# Patient Record
Sex: Male | Born: 1940
Health system: Southern US, Community
[De-identification: ages and names within clinical notes are randomized; demographics above are authoritative.]

## PROBLEM LIST (undated history)

## (undated) DIAGNOSIS — Z87442 Personal history of urinary calculi: Secondary | ICD-10-CM

## (undated) DIAGNOSIS — K219 Gastro-esophageal reflux disease without esophagitis: Secondary | ICD-10-CM

## (undated) DIAGNOSIS — M1990 Unspecified osteoarthritis, unspecified site: Secondary | ICD-10-CM

## (undated) DIAGNOSIS — I639 Cerebral infarction, unspecified: Secondary | ICD-10-CM

## (undated) DIAGNOSIS — E785 Hyperlipidemia, unspecified: Secondary | ICD-10-CM

## (undated) DIAGNOSIS — H3530 Unspecified macular degeneration: Secondary | ICD-10-CM

## (undated) DIAGNOSIS — F0390 Unspecified dementia without behavioral disturbance: Secondary | ICD-10-CM

## (undated) DIAGNOSIS — I739 Peripheral vascular disease, unspecified: Secondary | ICD-10-CM

## (undated) HISTORY — DX: Hyperlipidemia, unspecified: E78.5

## (undated) HISTORY — PX: FRACTURE SURGERY: SHX138

## (undated) HISTORY — PX: KNEE SURGERY: SHX244

## (undated) HISTORY — PX: NECK SURGERY: SHX720

## (undated) HISTORY — DX: Peripheral vascular disease, unspecified: I73.9

## (undated) HISTORY — PX: OTHER SURGICAL HISTORY: SHX169

## (undated) HISTORY — PX: EYE SURGERY: SHX253

## (undated) HISTORY — DX: Unspecified macular degeneration: H35.30

## (undated) HISTORY — PX: KNEE ARTHROPLASTY: SHX992

## (undated) HISTORY — PX: ROTATOR CUFF REPAIR: SHX139

---

## 2002-11-24 ENCOUNTER — Emergency Department (HOSPITAL_COMMUNITY): Admission: EM | Admit: 2002-11-24 | Discharge: 2002-11-24 | Payer: Self-pay | Admitting: Emergency Medicine

## 2003-12-09 ENCOUNTER — Inpatient Hospital Stay (HOSPITAL_COMMUNITY): Admission: EM | Admit: 2003-12-09 | Discharge: 2003-12-12 | Payer: Self-pay | Admitting: Emergency Medicine

## 2003-12-15 ENCOUNTER — Ambulatory Visit (HOSPITAL_COMMUNITY): Admission: RE | Admit: 2003-12-15 | Discharge: 2003-12-15 | Payer: Self-pay | Admitting: Orthopedic Surgery

## 2004-07-09 ENCOUNTER — Ambulatory Visit: Payer: Self-pay | Admitting: Orthopedic Surgery

## 2004-10-08 ENCOUNTER — Ambulatory Visit: Payer: Self-pay | Admitting: Orthopedic Surgery

## 2007-08-12 ENCOUNTER — Ambulatory Visit: Payer: Self-pay | Admitting: Cardiology

## 2008-11-10 ENCOUNTER — Ambulatory Visit (HOSPITAL_COMMUNITY): Admission: RE | Admit: 2008-11-10 | Discharge: 2008-11-10 | Payer: Self-pay | Admitting: Unknown Physician Specialty

## 2010-03-26 ENCOUNTER — Emergency Department (HOSPITAL_COMMUNITY): Admission: EM | Admit: 2010-03-26 | Discharge: 2010-03-26 | Payer: Self-pay | Admitting: Emergency Medicine

## 2010-08-15 LAB — HEPATIC FUNCTION PANEL
ALT: 21 U/L (ref 0–53)
Albumin: 4.1 g/dL (ref 3.5–5.2)
Indirect Bilirubin: 0.4 mg/dL (ref 0.3–0.9)
Total Protein: 6.5 g/dL (ref 6.0–8.3)

## 2010-08-15 LAB — PROTIME-INR: Prothrombin Time: 13.6 seconds (ref 11.6–15.2)

## 2010-08-15 LAB — URINALYSIS, ROUTINE W REFLEX MICROSCOPIC
Glucose, UA: NEGATIVE mg/dL
Nitrite: NEGATIVE
Specific Gravity, Urine: 1.023 (ref 1.005–1.030)
pH: 6 (ref 5.0–8.0)

## 2010-08-15 LAB — CBC
MCV: 91.6 fL (ref 78.0–100.0)
Platelets: 202 10*3/uL (ref 150–400)
RBC: 4.54 MIL/uL (ref 4.22–5.81)
RDW: 12.7 % (ref 11.5–15.5)
WBC: 7.3 10*3/uL (ref 4.0–10.5)

## 2010-08-15 LAB — SAMPLE TO BLOOD BANK

## 2010-08-15 LAB — DIFFERENTIAL
Basophils Absolute: 0 10*3/uL (ref 0.0–0.1)
Eosinophils Relative: 1 % (ref 0–5)
Lymphocytes Relative: 16 % (ref 12–46)
Lymphs Abs: 1.2 10*3/uL (ref 0.7–4.0)
Neutro Abs: 5.2 10*3/uL (ref 1.7–7.7)
Neutrophils Relative %: 71 % (ref 43–77)

## 2010-08-15 LAB — APTT: aPTT: 25 seconds (ref 24–37)

## 2010-08-15 LAB — POCT I-STAT, CHEM 8
BUN: 17 mg/dL (ref 6–23)
Hemoglobin: 15.6 g/dL (ref 13.0–17.0)
Potassium: 4 mEq/L (ref 3.5–5.1)
Sodium: 139 mEq/L (ref 135–145)
TCO2: 23 mmol/L (ref 0–100)

## 2010-10-19 NOTE — Discharge Summary (Signed)
NAME:  RAYSHON, ALBAUGH                             ACCOUNT NO.:  [id number]   MEDICAL RECORD NO.:  [id number]                   PATIENT TYPE:  INP   LOCATION:  A339                                 FACILITY:  [facility]   PHYSICIAN:  Vickki Hearing, M.D.           DATE OF BIRTH:  [birth year]   DATE OF ADMISSION:  12/09/2003  DATE OF DISCHARGE:  12/12/2003                                 DISCHARGE SUMMARY   ADDITIONAL DIAGNOSES:  Closed left tibia fracture.   DISCHARGE DIAGNOSES:  Closed left tibia fracture.   PROCEDURES:  Intramedullary nailing, left tibia, with proximal locking  screw.  Implants used:  Stryker 11x375 mm tibial nail (Stryker T2 standard  nail).  A 60 mm fully threaded proximal locking screw.   HISTORY OF PRESENT ILLNESS:  The patient is noted to have been cutting a  tree.  The limb snapped back, hit his leg and fractured his tibia.  He  cannot walk, complained of pain, mild deformity, neurovascularly intact.  Was submitted through the emergency room.  Ice and elevation was done on the  first night.  The second day he went to surgery and then had the closed  nailing without complications.   Postoperative did well.  No real problems.  Compartments have remained soft.  He is ambulating with a walker and should be able to go home today.   DISCHARGE MEDICATIONS:  Vicodin 5/500 1-2 q.4h. p.r.n. pain #60 with 5  refills.   FOLLOWUP:  Follow up scheduled for July 28 to have staples removed and have  x-ray done in the office.   DISPOSITION:  To home.   CONDITION ON DISCHARGE:  Stable.     ___________________________________________                                         Vickki Hearing, M.D.   SEH/MEDQ  D:  12/12/2003  T:  12/12/2003  Job:  [id number]

## 2010-10-19 NOTE — H&P (Signed)
NAME:  Terry Sloan, Terry Sloan                             ACCOUNT NO.:  [id number]   MEDICAL RECORD NO.:  [id number]                   PATIENT TYPE:  INP   LOCATION:  A339                                 FACILITY:  [facility]   PHYSICIAN:  Vickki Hearing, M.D.           DATE OF BIRTH:  1940/09/16   DATE OF ADMISSION:  12/09/2003  DATE OF DISCHARGE:                                HISTORY & PHYSICAL   CHIEF COMPLAINT:  Left leg pain.   HISTORY:  This is a 70 year old male who fractured his left tibia while  cutting a tree.  The tree hit his leg and fractured it.  He could not bear  weight, and presented to the emergency room in pain.  He also complained of  some back pain, but that was worked up in the emergency room and found to be  normal.  He had a chest x-ray, EKG, and laboratory studies, which were  normal, as well.   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   He has no history of heart disease or any medical problems.   MEDICATIONS:  He takes no medicines.   ALLERGIES:  No known drug allergies.   SOCIAL HISTORY:  He does not smoke.  His alcohol use is only occasional.   REVIEW OF SYSTEMS:  He did have some back pain prior to injury.   PHYSICAL EXAMINATION:  VITAL SIGNS:  Stable.  GENERAL:  He was well-developed and nourished.  Grooming and hygiene were  good.  CARDIOVASCULAR:  Normal pulses and perfusion.  SKIN:  Intact.  MUSCULOSKELETAL:  Normal findings, except for his left tibia, which was  tender and had a mild deformity.  He could move the foot fine.  Toes were  moved easily, as well.  Muscle tone was good.  NEUROLOGIC:  He complained of some numbness, depending on leg position.  His  calf and leg were soft, with no signs of compartment syndrome.  Mental  status - he was alert and awake.   His x-rays showed a left tibia fracture, which was closed.  There was a  small butterfly fragment, but it was essentially minimally displaced.   I discussed nailing versus casting.  He opted for a nail, stating that he  had to get back to work as soon as possible.  I think the nail will give him  the best chance to ambulate sooner.  Casting can require up to 6 months of  treatment, while with nailing he can start weightbearing after 1 month.     ___________________________________________  Vickki Hearing, M.D.   SEH/MEDQ  D:  12/10/2003  T:  12/10/2003  Job:  [id number]

## 2010-10-19 NOTE — Op Note (Signed)
NAME:  Terry Sloan, Terry Sloan                             ACCOUNT NO.:  [id number]   MEDICAL RECORD NO.:  [id number]                   PATIENT TYPE:  INP   LOCATION:  A339                                 FACILITY:  [facility]   PHYSICIAN:  Vickki Hearing, M.D.           DATE OF BIRTH:  Dec 28, 1940   DATE OF PROCEDURE:  12/10/2003  DATE OF DISCHARGE:                                 OPERATIVE REPORT   PREOPERATIVE DIAGNOSIS:  Closed left tibia fracture.   POSTOPERATIVE DIAGNOSIS:  Closed left tibia fracture.   PROCEDURE:  Intramedullary nailing left tibia.   SURGEON:  Vickki Hearing, M.D.   ANESTHETIC:  Spinal.   TOURNIQUET TIME:  45 minutes.   FINDINGS:  Closed left tibia fracture.   IMPLANTS USED:  1. An 11 x 375 mm tibial nail, Stryker T-2 standard nail.  2. A 60-mm fully threaded proximal locking screw.   HISTORY:  A 70 year old male sustained a closed left tibia fracture from a  tree branch snapping back into his left tibia.   PROCEDURE DETAILS:  Mr. Hayashi was identified in the preop holding area.  Initials were placed over the premarked surgical site which was the left  leg.  He was given Ancef and taken to the operating room for a spinal  anesthetic, placed supine with a tourniquet on the left thigh.  Sterile prep  and drape was done.  A time out was taken, as required.  Everyone agreed  with the procedure, left tibial nailing on [facility] and we proceeded by  elevating the tourniquet after exsanguination of the limb.   An incision was made over the patellar tendon and carried down to the  lateral side of the patellar tendon.  The proximal tibia was exposed through  blunt dissection.  A curved awl was placed in the intramedullary canal and  confirmed with AP and lateral C-arm radiographs.   A guidewire was passed across the fracture site and serial reamings up to a  12.5 mm size was performed.  The nail was passed.  Radiographs confirmed the  fracture reduction and position  of the nail.  We made a stab incision  laterally and passed a drill bit across the static hole in the nail,  measured it, and passed, and locked the screw.  We irrigated both wounds  closed with 2-0 Vicryl and staples on the tibial wound and staples on the  proximal locking  screw.  Fracture was stable.  Final radiographs confirmed the position of  the nail, the screw, the reduction, and all were satisfactory.   The wound was dressed sterilely.  The patient was taken to the recovery room  in stable condition.      ___________________________________________  Vickki Hearing, M.D.   SEH/MEDQ  D:  12/10/2003  T:  12/11/2003  Job:  [id number]

## 2010-10-19 NOTE — Op Note (Signed)
NAME:  Terry Sloan, Terry Sloan                             ACCOUNT NO.:  [id number]   MEDICAL RECORD NO.:  [id number]                   PATIENT TYPE:  INP   LOCATION:  A339                                 FACILITY:  [facility]   PHYSICIAN:  Vickki Hearing, M.D.           DATE OF BIRTH:  [birth year]   DATE OF PROCEDURE:  DATE OF DISCHARGE:  12/12/2003                                 OPERATIVE REPORT   ADMITTING DIAGNOSIS:  Left tibia fracture.   DISCHARGE DIAGNOSIS:  Left tibia fracture.   OPERATIVE PROCEDURE:  Intramedullary nailing left tibia.   IMPLANT USED:  Stryker T2 standard nail.   DICTATION STOPPED HERE AT DICTATOR REQUEST      ___________________________________________                                            Vickki Hearing, M.D.   SEH/MEDQ  D:  12/17/2003  T:  12/18/2003  Job:  [id number]

## 2013-12-17 ENCOUNTER — Encounter: Payer: Self-pay | Admitting: Family

## 2013-12-17 ENCOUNTER — Ambulatory Visit (INDEPENDENT_AMBULATORY_CARE_PROVIDER_SITE_OTHER): Payer: Commercial Managed Care - HMO | Admitting: Family

## 2013-12-17 ENCOUNTER — Encounter (INDEPENDENT_AMBULATORY_CARE_PROVIDER_SITE_OTHER): Payer: Self-pay

## 2013-12-17 VITALS — BP 141/81 | HR 66 | Temp 96.5°F | Ht 72.0 in | Wt 212.0 lb

## 2013-12-17 DIAGNOSIS — M25569 Pain in unspecified knee: Secondary | ICD-10-CM

## 2013-12-17 DIAGNOSIS — M25562 Pain in left knee: Secondary | ICD-10-CM

## 2013-12-17 MED ORDER — TRAMADOL HCL 50 MG PO TABS: 50.0000 mg | ORAL_TABLET | Freq: Three times a day (TID) | ORAL | Status: DC | PRN

## 2013-12-17 NOTE — Progress Notes (Signed)
   Subjective:    Patient ID: Terry Sloan, male    DOB: Aug 28, 1940, 73 y.o.   MRN: [id number]  HPI PT presents to office to establish care. Pt currently having left knee pain. Pt wants to have ortho referral. Pt states it was broken 2004 and had surgery. Pt states he has pins and screws in knee. Pt states it is a sharp, aching constant pain 10 out of 10.    Review of Systems  Constitutional: Negative.   HENT: Negative.   Respiratory: Negative.   Cardiovascular: Negative.   Gastrointestinal: Negative.   Endocrine: Negative.   Genitourinary: Negative.   Musculoskeletal: Positive for gait problem and joint swelling.  Neurological: Negative.   Hematological: Negative.   Psychiatric/Behavioral: Negative.   All other systems reviewed and are negative.      Objective:   Physical Exam  Vitals reviewed. Constitutional: He is oriented to person, place, and time. He appears well-developed and well-nourished. No distress.  Cardiovascular: Normal rate, regular rhythm, normal heart sounds and intact distal pulses.   No murmur heard. Pulmonary/Chest: Effort normal and breath sounds normal. No respiratory distress. He has no wheezes.  Abdominal: Soft. Bowel sounds are normal. He exhibits no distension. There is no tenderness.  Musculoskeletal: Normal range of motion. He exhibits edema (2+ edema in bilateral legs) and tenderness (left knee).  Decreased ROM of left knee r/t to edema and pain   Neurological: He is alert and oriented to person, place, and time. He has normal reflexes. No cranial nerve deficit.  Skin: Skin is warm and dry. No rash noted. No erythema.  Psychiatric: He has a normal mood and affect. His behavior is normal. Judgment and thought content normal.     BP 141/81  Pulse 66  Temp(Src) 96.5 F (35.8 C) (Oral)  Ht 6' (1.829 m)  Wt 212 lb (96.163 kg)  BMI 28.75 kg/m2      Assessment & Plan:  1. Left knee pain - CMP14+EGFR - traMADol (ULTRAM) 50 MG tablet; Take 1  tablet (50 mg total) by mouth every 8 (eight) hours as needed.  Dispense: 30 tablet; Refill: 1 -Pt will need ortho referral- Waiting for provider name on  insurance card -[facility] -[facility]  Evelina Dun, FNP

## 2013-12-17 NOTE — Patient Instructions (Signed)

## 2013-12-18 LAB — CMP14+EGFR
A/G RATIO: 2 (ref 1.1–2.5)
ALK PHOS: 79 IU/L (ref 39–117)
ALT: 13 IU/L (ref 0–44)
AST: 20 IU/L (ref 0–40)
Albumin: 4.2 g/dL (ref 3.5–4.8)
BILIRUBIN TOTAL: 0.4 mg/dL (ref 0.0–1.2)
BUN / CREAT RATIO: 15 (ref 10–22)
BUN: 19 mg/dL (ref 8–27)
CALCIUM: 8.9 mg/dL (ref 8.6–10.2)
CO2: 23 mmol/L (ref 18–29)
Chloride: 105 mmol/L (ref 97–108)
Creatinine, Ser: 1.24 mg/dL (ref 0.76–1.27)
GFR, EST AFRICAN AMERICAN: 66 mL/min/{1.73_m2} (ref >59)
GFR, EST NON AFRICAN AMERICAN: 57 mL/min/{1.73_m2} — AB (ref >59)
Globulin, Total: 2.1 g/dL (ref 1.5–4.5)
Glucose: 90 mg/dL (ref 65–99)
POTASSIUM: 4.1 mmol/L (ref 3.5–5.2)
SODIUM: 141 mmol/L (ref 134–144)
Total Protein: 6.3 g/dL (ref 6.0–8.5)

## 2013-12-20 ENCOUNTER — Telehealth: Payer: Self-pay | Admitting: Family Medicine

## 2013-12-20 NOTE — Telephone Encounter (Signed)
Message copied by Azalee CourseFULP, ASHLEY on Mon Dec 20, 2013 10:31 AM ------      Message from: Lendon ColonelHAWKS, [place] A      Created: Mon Dec 20, 2013  9:53 AM       Kidney and liver function stable       ------

## 2013-12-21 ENCOUNTER — Encounter: Payer: Self-pay | Admitting: Family

## 2014-01-07 ENCOUNTER — Ambulatory Visit (INDEPENDENT_AMBULATORY_CARE_PROVIDER_SITE_OTHER): Payer: Commercial Managed Care - HMO | Admitting: Family Medicine

## 2014-01-07 VITALS — BP 133/76 | HR 80 | Temp 98.0°F | Ht 72.0 in | Wt 209.8 lb

## 2014-01-07 DIAGNOSIS — T148 Other injury of unspecified body region: Secondary | ICD-10-CM

## 2014-01-07 DIAGNOSIS — L03116 Cellulitis of left lower limb: Secondary | ICD-10-CM

## 2014-01-07 DIAGNOSIS — L03119 Cellulitis of unspecified part of limb: Secondary | ICD-10-CM

## 2014-01-07 DIAGNOSIS — L02419 Cutaneous abscess of limb, unspecified: Secondary | ICD-10-CM

## 2014-01-07 DIAGNOSIS — W57XXXA Bitten or stung by nonvenomous insect and other nonvenomous arthropods, initial encounter: Secondary | ICD-10-CM

## 2014-01-07 MED ORDER — CIPROFLOXACIN HCL 500 MG PO TABS: 500.0000 mg | ORAL_TABLET | Freq: Two times a day (BID) | ORAL | Status: DC

## 2014-01-07 MED ORDER — CEFTRIAXONE SODIUM 1 G IJ SOLR
1.0000 g | INTRAMUSCULAR | Status: AC
  Administered 2014-01-07: 1 g via INTRAMUSCULAR

## 2014-01-07 MED ORDER — DOXYCYCLINE HYCLATE 100 MG PO TABS: 100.0000 mg | ORAL_TABLET | Freq: Two times a day (BID) | ORAL | Status: DC

## 2014-01-07 MED ORDER — HYDROCODONE-ACETAMINOPHEN 5-325 MG PO TABS
1.0000 | ORAL_TABLET | Freq: Four times a day (QID) | ORAL | Status: DC | PRN
Start: 2014-01-07 — End: 2014-01-25

## 2014-01-07 NOTE — Progress Notes (Signed)
   Subjective:    Patient ID: Terry Sloan, male    DOB: 1940/11/22, 73 y.o.   MRN: [id number]  HPI This 73 y.o. male presents for evaluation of swelling, discomfort, and infection in left lower extremity after being bitten by tick.   Review of Systems C/o left lower leg swelling and erythema.   No chest pain, SOB, HA, dizziness, vision change, N/V, diarrhea, constipation, dysuria, urinary urgency or frequency, myalgias, arthralgias or rash.  Objective:   Physical Exam Vital signs noted  Well developed well nourished male.  HEENT - Head atraumatic Normocephalic Respiratory - Lungs CTA bilateral Cardiac - RRR S1 and S2 without murmur GI - Abdomen soft Nontender and bowel sounds active x 4 Extremities - Left lower extremity with swelling and erythema and left lateral part of leg With dark erythematous area with puncture site from tick bite      Assessment & Plan:  Cellulitis of left lower extremity - Plan: cefTRIAXone (ROCEPHIN) injection 1 g, HYDROcodone-acetaminophen (NORCO) 5-325 MG per tablet, doxycycline (VIBRA-TABS) 100 MG tablet, ciprofloxacin (CIPRO) 500 MG tablet  Tick bite - Plan: Rocky mtn spotted fvr abs pnl(IgG+IgM), Lyme Ab/Western Blot Reflex  Follow up if not better in 3 days and prn if worsening  Deatra CanterWilliam J Marwah Disbro FNP

## 2014-01-10 ENCOUNTER — Ambulatory Visit (INDEPENDENT_AMBULATORY_CARE_PROVIDER_SITE_OTHER): Payer: Commercial Managed Care - HMO | Admitting: Family Medicine

## 2014-01-10 VITALS — BP 128/75 | HR 72 | Temp 98.6°F | Ht 72.0 in | Wt 208.6 lb

## 2014-01-10 DIAGNOSIS — L02419 Cutaneous abscess of limb, unspecified: Secondary | ICD-10-CM

## 2014-01-10 DIAGNOSIS — L03119 Cellulitis of unspecified part of limb: Principal | ICD-10-CM

## 2014-01-10 LAB — POCT CBC
Granulocyte percent: 69.6 %G (ref 37–80)
HCT, POC: 40.5 % — AB (ref 43.5–53.7)
Hemoglobin: 13.4 g/dL — AB (ref 14.1–18.1)
Lymph, poc: 1.5 (ref 0.6–3.4)
MCH, POC: 31.6 pg — AB (ref 27–31.2)
MCHC: 33 g/dL (ref 31.8–35.4)
MCV: 95.7 fL (ref 80–97)
MPV: 6.3 fL (ref 0–99.8)
POC Granulocyte: 4.2 (ref 2–6.9)
POC LYMPH PERCENT: 25.6 %L (ref 10–50)
Platelet Count, POC: 251 10*3/uL (ref 142–424)
RBC: 4.2 M/uL — AB (ref 4.69–6.13)
RDW, POC: 13.6 %
WBC: 6 10*3/uL (ref 4.6–10.2)

## 2014-01-10 NOTE — Progress Notes (Signed)
   Subjective:    Patient ID: Terry CornerOtis L Roszak, male    DOB: 1941/01/30, 73 y.o.   MRN: [id number]  HPI This 73 y.o. male presents for evaluation of worsening cellulitis left lower extremity.  He was seen last week for cellulitis of the left lower extremity and was rx'd rocephin 1 gram IM and cipro and doxycycline and has had 3 days tx with these abx's.  He was rx'd hydrocodone for pain.  He is worse and states he has worsening swelling, pain, and redness in his left leg.  He states he was bitten by tick 2 weeks ago. He has pending wound cx and RMSF an LYmes titers.     PMH - OA, DJD left knee, Hx of bone tumor on cranium 1962, hx of gallstone, hx of kidney stone,  PSH - Cervical spine surgery, Craniotomy 1962, ORIF left tibia 2004.  Social - Divorced, ETOH - rare Tobacco - 1ppd x 40 years and quit  Family hx - Father deceased from pneumonia age 74                    Mother deceased age 74 from pulmonary embolism                    Brothers 2 deceased one MVA and other Alcoholism                    Sisters 6 alive and well    Review of Systems C/o cellulitis left leg No chest pain, SOB, HA, dizziness, vision change, N/V, diarrhea, constipation, dysuria, urinary urgency or frequency, myalgias, arthralgias or rash.     Objective:   Physical Exam Vital signs noted  Well developed well nourished male.  HEENT - Head atraumatic Normocephalic Respiratory - Lungs CTA bilateral Cardiac - RRR S1 and S2 without murmur GI - Abdomen soft Nontender and bowel sounds active x 4 Extremities - Left lower extremity with ulcer on lateral upper leg and erythema extending from knee to ankle and leg has 2 plus edema and is tender, neg homans  Results for orders placed in visit on 01/10/14  POCT CBC      Result Value Ref Range   WBC 6.0  4.6 - 10.2 K/uL   Lymph, poc 1.5  0.6 - 3.4   POC LYMPH PERCENT 25.6  10 - 50 %L   POC Granulocyte 4.2  2 - 6.9   Granulocyte percent 69.6  37 - 80 %G   RBC 4.2 (*)  4.69 - 6.13 M/uL   Hemoglobin 13.4 (*) 14.1 - 18.1 g/dL   HCT, POC 40.940.5 (*) 81.143.5 - 53.7 %   MCV 95.7  80 - 97 fL   MCH, POC 31.6 (*) 27 - 31.2 pg   MCHC 33.0  31.8 - 35.4 g/dL   RDW, POC 91.413.6     Platelet Count, POC 251.0  142 - 424 K/uL   MPV 6.3  0 - 99.8 fL      Assessment & Plan:  Cellulitis and abscess of leg, except foot - Plan: POCT CBC  He is failing doxy and cipro and needs to go to hospital.  [facility]  [facility].   Deatra CanterWilliam J Oxford FNP

## 2014-01-11 LAB — LYME AB/WESTERN BLOT REFLEX
LYME DISEASE AB, QUANT, IGM: <0.8 index (ref 0.00–0.79)
Lyme IgG/IgM Ab: <0.91 {ISR} (ref 0.00–0.90)

## 2014-01-11 LAB — ROCKY MTN SPOTTED FVR ABS PNL(IGG+IGM)
RMSF IgG: POSITIVE — AB
RMSF IgM: 0.19 index (ref 0.00–0.89)

## 2014-01-11 LAB — RMSF, IGG, IFA: RMSF, IGG, IFA: 1:64 {titer} — ABNORMAL HIGH

## 2014-01-25 ENCOUNTER — Ambulatory Visit: Payer: Commercial Managed Care - HMO | Admitting: Family Medicine

## 2014-01-25 ENCOUNTER — Encounter: Payer: Self-pay | Admitting: Family Medicine

## 2014-01-25 ENCOUNTER — Ambulatory Visit (INDEPENDENT_AMBULATORY_CARE_PROVIDER_SITE_OTHER): Payer: Commercial Managed Care - HMO | Admitting: Family Medicine

## 2014-01-25 VITALS — BP 111/70 | HR 72 | Temp 98.0°F | Ht 72.0 in | Wt 206.0 lb

## 2014-01-25 DIAGNOSIS — M25462 Effusion, left knee: Secondary | ICD-10-CM

## 2014-01-25 DIAGNOSIS — L02419 Cutaneous abscess of limb, unspecified: Secondary | ICD-10-CM

## 2014-01-25 DIAGNOSIS — L03119 Cellulitis of unspecified part of limb: Secondary | ICD-10-CM

## 2014-01-25 DIAGNOSIS — L03116 Cellulitis of left lower limb: Secondary | ICD-10-CM

## 2014-01-25 DIAGNOSIS — M25469 Effusion, unspecified knee: Secondary | ICD-10-CM

## 2014-01-25 NOTE — Progress Notes (Signed)
   Subjective:    Patient ID: Terry Sloan, male    DOB: June 28, 1940, 73 y.o.   MRN: [id number]  HPI This 73 y.o. male presents for evaluation of follow up from hospital for MRSA associated Abscess left lower extremity and left knee joint effusion secondary to osteoarthritis. He is having left knee pain.  He was seen in clinic and directly admitted to hospitalist service At [facility] in [place] on 01/10/14 and DC 01/12/14.  He was rx'd vanco IV in hospital and had Surgical consult and surgery recommended IV abx's since he had no fluctuance.  He was rx'd  Bactrim DS and rifampin po for 10 days and has just finished.  He has been doing a lot better. He did see ortho and they recommended no surgery until he was healed up.   Review of Systems C/o left knee pain No chest pain, SOB, HA, dizziness, vision change, N/V, diarrhea, constipation, dysuria, urinary urgency or frequency, myalgias, arthralgias or rash.     Objective:   Physical Exam Vital signs noted  Well developed well nourished male.  HEENT - Head atraumatic Normocephalic                Eyes - PERRLA, Conjuctiva - clear Sclera- Clear EOMI                Ears - EAC's Wnl TM's Wnl Gross Hearing WNL                Throat - oropharanx wnl Respiratory - Lungs CTA bilateral Cardiac - RRR S1 and S2 without murmur GI - Abdomen soft Nontender and bowel sounds active x 4 MS - TTP Left knee with swelling. Skin- Scab left lateral leg.  Left lateral leg cellulitis resolved except for scab and some general edema           In his left leg       Assessment & Plan:  Cellulitis of left lower extremity - Continue doxycycline bid for next 10 days  Knee effusion, left - Take hydrocodone and follow up with ortho prn  Deatra Canter FNP

## 2014-02-08 ENCOUNTER — Telehealth: Payer: Self-pay | Admitting: Family Medicine

## 2014-02-08 ENCOUNTER — Other Ambulatory Visit: Payer: Self-pay | Admitting: Family Medicine

## 2014-02-08 DIAGNOSIS — L03116 Cellulitis of left lower limb: Secondary | ICD-10-CM

## 2014-02-08 MED ORDER — HYDROCODONE-ACETAMINOPHEN 5-325 MG PO TABS: 1.0000 | ORAL_TABLET | Freq: Four times a day (QID) | ORAL | Status: DC | PRN

## 2014-02-08 NOTE — Telephone Encounter (Signed)
Pt notified that RX for Hydrocodone is ready for pick up Rx to front for pt pick up

## 2014-02-08 NOTE — Telephone Encounter (Signed)
Come in and p/u rx of hydrocodone

## 2014-03-25 ENCOUNTER — Encounter: Payer: Self-pay | Admitting: Family Medicine

## 2014-03-25 ENCOUNTER — Ambulatory Visit (INDEPENDENT_AMBULATORY_CARE_PROVIDER_SITE_OTHER): Payer: Commercial Managed Care - HMO | Admitting: Family Medicine

## 2014-03-25 ENCOUNTER — Telehealth: Payer: Self-pay | Admitting: Family Medicine

## 2014-03-25 VITALS — BP 122/70 | HR 62 | Temp 97.8°F | Ht 72.0 in | Wt 211.0 lb

## 2014-03-25 DIAGNOSIS — R609 Edema, unspecified: Secondary | ICD-10-CM

## 2014-03-25 MED ORDER — FUROSEMIDE 20 MG PO TABS: ORAL_TABLET | ORAL | Status: DC

## 2014-03-25 NOTE — Progress Notes (Signed)
   Subjective:    Patient ID: Terry CornerOtis L Casares, male    DOB: September 17, 1940, 73 y.o.   MRN: [id number]  HPI C/o edema in lower extremities  Review of Systems  Constitutional: Negative for fever.  HENT: Negative for ear pain.   Eyes: Negative for discharge.  Respiratory: Negative for cough.   Cardiovascular: Positive for leg swelling. Negative for chest pain.  Gastrointestinal: Negative for abdominal distention.  Endocrine: Negative for polyuria.  Genitourinary: Negative for difficulty urinating.  Musculoskeletal: Negative for gait problem and neck pain.  Skin: Negative for color change and rash.  Neurological: Negative for speech difficulty and headaches.  Psychiatric/Behavioral: Negative for agitation.       Objective:    BP 122/70  Pulse 62  Temp(Src) 97.8 F (36.6 C) (Oral)  Ht 6' (1.829 m)  Wt 211 lb (95.709 kg)  BMI 28.61 kg/m2 Physical Exam  Constitutional: He is oriented to person, place, and time. He appears well-developed and well-nourished.  HENT:  Head: Normocephalic and atraumatic.  Mouth/Throat: Oropharynx is clear and moist.  Eyes: Pupils are equal, round, and reactive to light.  Neck: Normal range of motion. Neck supple.  Cardiovascular: Normal rate and regular rhythm.   No murmur heard. Pulmonary/Chest: Effort normal and breath sounds normal.  Abdominal: Soft. Bowel sounds are normal. There is no tenderness.  Musculoskeletal: He exhibits edema.  Neurological: He is alert and oriented to person, place, and time.  Skin: Skin is warm and dry.  Psychiatric: He has a normal mood and affect.          Assessment & Plan:     ICD-9-CM ICD-10-CM   1. Edema 782.3 R60.9 furosemide (LASIX) 20 MG tablet     No Follow-up on file.  Deatra CanterWilliam J Oxford FNP

## 2014-03-25 NOTE — Telephone Encounter (Signed)
[facility] [facility] [address]

## 2014-07-04 ENCOUNTER — Telehealth: Payer: Self-pay | Admitting: Family Medicine

## 2014-07-05 ENCOUNTER — Other Ambulatory Visit: Payer: Self-pay | Admitting: Family Medicine

## 2014-07-05 DIAGNOSIS — L03116 Cellulitis of left lower limb: Secondary | ICD-10-CM

## 2014-07-05 MED ORDER — HYDROCODONE-ACETAMINOPHEN 5-325 MG PO TABS: 1.0000 | ORAL_TABLET | Freq: Four times a day (QID) | ORAL | Status: DC | PRN

## 2014-07-06 DIAGNOSIS — S79912A Unspecified injury of left hip, initial encounter: Secondary | ICD-10-CM | POA: Diagnosis not present

## 2014-07-06 DIAGNOSIS — M25552 Pain in left hip: Secondary | ICD-10-CM | POA: Diagnosis not present

## 2014-07-06 NOTE — Telephone Encounter (Signed)
Called to inform pt rx ready for pick up at office

## 2014-07-11 ENCOUNTER — Ambulatory Visit: Payer: Commercial Managed Care - HMO | Admitting: Family Medicine

## 2014-07-16 DIAGNOSIS — M47817 Spondylosis without myelopathy or radiculopathy, lumbosacral region: Secondary | ICD-10-CM | POA: Diagnosis not present

## 2014-07-16 DIAGNOSIS — M5432 Sciatica, left side: Secondary | ICD-10-CM | POA: Diagnosis not present

## 2014-07-16 DIAGNOSIS — M5126 Other intervertebral disc displacement, lumbar region: Secondary | ICD-10-CM | POA: Diagnosis not present

## 2014-07-16 DIAGNOSIS — M25552 Pain in left hip: Secondary | ICD-10-CM | POA: Diagnosis not present

## 2014-07-16 DIAGNOSIS — M4806 Spinal stenosis, lumbar region: Secondary | ICD-10-CM | POA: Diagnosis not present

## 2014-07-16 DIAGNOSIS — M5136 Other intervertebral disc degeneration, lumbar region: Secondary | ICD-10-CM | POA: Diagnosis not present

## 2014-07-16 DIAGNOSIS — M1711 Unilateral primary osteoarthritis, right knee: Secondary | ICD-10-CM | POA: Diagnosis not present

## 2014-07-16 DIAGNOSIS — Z87891 Personal history of nicotine dependence: Secondary | ICD-10-CM | POA: Diagnosis not present

## 2014-07-16 DIAGNOSIS — M179 Osteoarthritis of knee, unspecified: Secondary | ICD-10-CM | POA: Diagnosis not present

## 2014-07-19 DIAGNOSIS — M1712 Unilateral primary osteoarthritis, left knee: Secondary | ICD-10-CM | POA: Diagnosis not present

## 2014-07-19 DIAGNOSIS — M5432 Sciatica, left side: Secondary | ICD-10-CM | POA: Diagnosis not present

## 2014-07-21 DIAGNOSIS — M79605 Pain in left leg: Secondary | ICD-10-CM | POA: Diagnosis not present

## 2014-07-21 DIAGNOSIS — M79662 Pain in left lower leg: Secondary | ICD-10-CM | POA: Diagnosis not present

## 2014-07-21 DIAGNOSIS — R52 Pain, unspecified: Secondary | ICD-10-CM | POA: Diagnosis not present

## 2014-07-21 DIAGNOSIS — M545 Low back pain: Secondary | ICD-10-CM | POA: Diagnosis not present

## 2014-07-21 DIAGNOSIS — Z87442 Personal history of urinary calculi: Secondary | ICD-10-CM | POA: Diagnosis not present

## 2014-07-21 DIAGNOSIS — M549 Dorsalgia, unspecified: Secondary | ICD-10-CM | POA: Diagnosis not present

## 2014-07-22 DIAGNOSIS — M79605 Pain in left leg: Secondary | ICD-10-CM | POA: Diagnosis not present

## 2014-07-22 DIAGNOSIS — M549 Dorsalgia, unspecified: Secondary | ICD-10-CM | POA: Diagnosis not present

## 2014-07-22 DIAGNOSIS — Z87442 Personal history of urinary calculi: Secondary | ICD-10-CM | POA: Diagnosis not present

## 2014-08-16 DIAGNOSIS — M1712 Unilateral primary osteoarthritis, left knee: Secondary | ICD-10-CM | POA: Diagnosis not present

## 2014-08-16 DIAGNOSIS — M5126 Other intervertebral disc displacement, lumbar region: Secondary | ICD-10-CM | POA: Diagnosis not present

## 2015-02-27 ENCOUNTER — Encounter: Payer: Self-pay | Admitting: Family Medicine

## 2015-02-27 ENCOUNTER — Ambulatory Visit (INDEPENDENT_AMBULATORY_CARE_PROVIDER_SITE_OTHER): Payer: Commercial Managed Care - HMO | Admitting: Family Medicine

## 2015-02-27 ENCOUNTER — Encounter: Payer: Commercial Managed Care - HMO | Admitting: Family Medicine

## 2015-02-27 VITALS — BP 138/70 | HR 73 | Temp 97.2°F | Ht 69.0 in | Wt 213.2 lb

## 2015-02-27 DIAGNOSIS — Z0000 Encounter for general adult medical examination without abnormal findings: Secondary | ICD-10-CM | POA: Diagnosis not present

## 2015-02-27 DIAGNOSIS — E785 Hyperlipidemia, unspecified: Secondary | ICD-10-CM | POA: Diagnosis not present

## 2015-02-27 DIAGNOSIS — Z1212 Encounter for screening for malignant neoplasm of rectum: Secondary | ICD-10-CM | POA: Diagnosis not present

## 2015-02-27 DIAGNOSIS — N471 Phimosis: Secondary | ICD-10-CM

## 2015-02-27 DIAGNOSIS — N400 Enlarged prostate without lower urinary tract symptoms: Secondary | ICD-10-CM | POA: Diagnosis not present

## 2015-02-27 DIAGNOSIS — Z0189 Encounter for other specified special examinations: Secondary | ICD-10-CM | POA: Diagnosis not present

## 2015-02-27 DIAGNOSIS — E559 Vitamin D deficiency, unspecified: Secondary | ICD-10-CM | POA: Diagnosis not present

## 2015-02-27 NOTE — Progress Notes (Signed)
Subjective:  Patient ID: Terry Sloan, male    DOB: 1941/01/20  Age: 74 y.o. MRN: [id number]  CC: Annual Exam   HPI Terry Sloan presents for penis getting trapped- foreskin growing together. Annual exam  History Terry Sloan has no past medical history on file.   Terry Sloan has no past surgical history on file.   His family history is not on file.Terry Sloan reports that Terry Sloan has quit smoking. Terry Sloan does not have any smokeless tobacco history on file. Terry Sloan reports that Terry Sloan does not drink alcohol or use illicit drugs.  Outpatient Prescriptions Prior to Visit  Medication Sig Dispense Refill  . furosemide (LASIX) 20 MG tablet One po qd x 2-3 days prn swelling (Patient not taking: Reported on 02/27/2015) 30 tablet 0  . HYDROcodone-acetaminophen (NORCO) 5-325 MG per tablet Take 1 tablet by mouth every 6 (six) hours as needed for moderate pain. (Patient not taking: Reported on 02/27/2015) 30 tablet 0   No facility-administered medications prior to visit.    ROS Review of Systems  Constitutional: Negative for fever, chills, diaphoresis, activity change, appetite change, fatigue and unexpected weight change.  HENT: Negative for congestion, ear pain, hearing loss, postnasal drip, rhinorrhea, sore throat, tinnitus and trouble swallowing.   Eyes: Negative for photophobia, pain, discharge and redness.  Respiratory: Negative for apnea, cough, choking, chest tightness, shortness of breath, wheezing and stridor.   Cardiovascular: Negative for chest pain, palpitations and leg swelling.  Gastrointestinal: Negative for nausea, vomiting, abdominal pain, diarrhea, constipation, blood in stool and abdominal distention.  Endocrine: Negative for cold intolerance, heat intolerance, polydipsia, polyphagia and polyuria.  Genitourinary: Negative for dysuria, urgency, frequency, hematuria, flank pain, enuresis, difficulty urinating and genital sores.  Musculoskeletal: Negative for joint swelling and arthralgias.  Skin: Negative for color  change, rash and wound.  Allergic/Immunologic: Negative for immunocompromised state.  Neurological: Negative for dizziness, tremors, seizures, syncope, facial asymmetry, speech difficulty, weakness, light-headedness, numbness and headaches.  Hematological: Does not bruise/bleed easily.  Psychiatric/Behavioral: Negative for suicidal ideas, hallucinations, behavioral problems, confusion, sleep disturbance, dysphoric mood, decreased concentration and agitation. The patient is not nervous/anxious and is not hyperactive.     Objective:  BP 138/70 mmHg  Pulse 73  Temp(Src) 97.2 F (36.2 C) (Oral)  Ht 5' 9" (1.753 m)  Wt 213 lb 3.2 oz (96.707 kg)  BMI 31.47 kg/m2  BP Readings from Last 3 Encounters:  02/27/15 138/70  03/25/14 122/70  01/25/14 111/70    Wt Readings from Last 3 Encounters:  02/27/15 213 lb 3.2 oz (96.707 kg)  03/25/14 211 lb (95.709 kg)  01/25/14 206 lb (93.441 kg)     Physical Exam  Constitutional: Terry Sloan is oriented to person, place, and time. Terry Sloan appears well-developed and well-nourished. No distress.  HENT:  Head: Normocephalic and atraumatic.  Right Ear: External ear normal.  Left Ear: External ear normal.  Nose: Nose normal.  Mouth/Throat: Oropharynx is clear and moist.  Eyes: Conjunctivae and EOM are normal. Pupils are equal, round, and reactive to light.  Neck: Normal range of motion. Neck supple. No tracheal deviation present. No thyromegaly present.  Cardiovascular: Normal rate, regular rhythm and normal heart sounds.  Exam reveals no gallop and no friction rub.   No murmur heard. Pulmonary/Chest: Effort normal and breath sounds normal. No respiratory distress. Terry Sloan has no wheezes. Terry Sloan has no rales.  Abdominal: Soft. Bowel sounds are normal. Terry Sloan exhibits no distension and no mass. There is no tenderness.  Genitourinary: No penile tenderness.  Prostate is very  large, rockhard firmness. No nodularity. Penis has moderate phimosis. Stool IFOB obtained. No significant  hemorrhoids palpable  Musculoskeletal: Normal range of motion. Terry Sloan exhibits no edema.  Lymphadenopathy:    Terry Sloan has no cervical adenopathy.  Neurological: Terry Sloan is alert and oriented to person, place, and time. Terry Sloan has normal reflexes.  Skin: Skin is warm and dry.  Psychiatric: Terry Sloan has a normal mood and affect. His behavior is normal. Judgment and thought content normal.    No results found for: HGBA1C  Lab Results  Component Value Date   WBC 6.0 01/10/2014   HGB 13.4* 01/10/2014   HCT 40.5* 01/10/2014   PLT 202 03/26/2010   GLUCOSE 90 12/17/2013   ALT 13 12/17/2013   AST 20 12/17/2013   NA 141 12/17/2013   K 4.1 12/17/2013   CL 105 12/17/2013   CREATININE 1.24 12/17/2013   BUN 19 12/17/2013   CO2 23 12/17/2013   INR 1.02 03/26/2010    Dg Elbow Complete Right  03/26/2010   Clinical Data: Pain post fall   RIGHT ELBOW - COMPLETE 3+ VIEW   Comparison: None.   Findings: Corticated ossicle at the lateral margin of the lateral epicondyle.  No effusion. Negative for fracture, dislocation, or other acute abnormality.  Normal alignment and mineralization. No significant degenerative change.  Regional soft tissues unremarkable.   IMPRESSION:   Negative  Provider: Brooke Dare  Ct Head Wo Contrast  03/26/2010   Clinical Data:  Recent fall.   CT HEAD WITHOUT CONTRAST CT CERVICAL SPINE WITHOUT CONTRAST   Technique:  Multidetector CT imaging of the head and cervical spine was performed following the standard protocol without intravenous contrast.  Multiplanar CT image reconstructions of the cervical spine were also generated.   Comparison:  None.   CT HEAD   Findings: There has been previous right frontal craniectomy.  There are no midline shift or mass lesions.  There is no evidence for acute infarction or hemorrhage.  There are no extra-axial fluid collections.  Bone windows settings are normal aside from the right frontal craniectomy defect.  The paranasal sinuses appear normally aerated.    IMPRESSION: Previous right frontal craniectomy.  No acute intracranial findings.   CT CERVICAL SPINE   Findings: Degenerative disc space narrowing is noted at the C6-7 level.  There are no fractures, subluxations, or destructive changes.  The C1-C2 articulation has a normal appearance.  There is no prevertebral  soft tissue swelling.   IMPRESSION: C6-7 degenerative disc space narrowing.  No acute findings.  Provider: Jory Sims, [place]  Ct Chest W Contrast  03/26/2010   Clinical Data:  [place] onto right side.  Right chest and back pain   CT CHEST, ABDOMEN AND PELVIS WITH CONTRAST   Technique:  Multidetector CT imaging of the chest, abdomen and pelvis was performed following the standard protocol during bolus administration of intravenous contrast.   Contrast: 80 ml Omnipaque-300   Comparison:  None.   CT CHEST   Findings:  No rib or spine fractures.  No pneumothorax or lung contusion.  There are scar like changes at the lung bases.  No pleural or pericardial fluid.  Mediastinum normal.  No adenopathy. Thyroid gland normal.   IMPRESSION: No acute or significant findings.   CT ABDOMEN AND PELVIS   Findings:  Liver, spleen, pancreas, and adrenal glands normal. There are multiple gallstones.  No adenopathy or ascites.  No hemoperitoneum.  There is heavy aortoiliac calcification without aneurysm.   There are several small,  nonobstructing right renal calculi.  There are multiple bilateral renal cysts.  The largest is an exophytic cyst emanating off the upper pole of the left kidney, measuring 5.5 cm.   No inflammatory or post-traumatic changes noted to the GI tract.   No fractures of the spine or the bony pelvis.  There are degenerative changes of the lumbar spine, with degenerative disc disease at L3-4.   IMPRESSION:   1.  Cholelithiasis. 2.  Small, nonobstructing right renal calculi. 3.  Multiple bilateral simple renal cysts. 4.  No acute or significant post traumatic findings. 5.  Heavy aortoiliac  calcification.  Provider: Jory Sims, [place]  Ct Cervical Spine Wo Contrast  03/26/2010   Clinical Data:  Recent fall.   CT HEAD WITHOUT CONTRAST CT CERVICAL SPINE WITHOUT CONTRAST   Technique:  Multidetector CT imaging of the head and cervical spine was performed following the standard protocol without intravenous contrast.  Multiplanar CT image reconstructions of the cervical spine were also generated.   Comparison:  None.   CT HEAD   Findings: There has been previous right frontal craniectomy.  There are no midline shift or mass lesions.  There is no evidence for acute infarction or hemorrhage.  There are no extra-axial fluid collections.  Bone windows settings are normal aside from the right frontal craniectomy defect.  The paranasal sinuses appear normally aerated.   IMPRESSION: Previous right frontal craniectomy.  No acute intracranial findings.   CT CERVICAL SPINE   Findings: Degenerative disc space narrowing is noted at the C6-7 level.  There are no fractures, subluxations, or destructive changes.  The C1-C2 articulation has a normal appearance.  There is no prevertebral  soft tissue swelling.   IMPRESSION: C6-7 degenerative disc space narrowing.  No acute findings.  Provider: Jory Sims, [place]  Ct Abdomen Pelvis W Contrast  03/26/2010   Clinical Data:  [place] onto right side.  Right chest and back pain   CT CHEST, ABDOMEN AND PELVIS WITH CONTRAST   Technique:  Multidetector CT imaging of the chest, abdomen and pelvis was performed following the standard protocol during bolus administration of intravenous contrast.   Contrast: 80 ml Omnipaque-300   Comparison:  None.   CT CHEST   Findings:  No rib or spine fractures.  No pneumothorax or lung contusion.  There are scar like changes at the lung bases.  No pleural or pericardial fluid.  Mediastinum normal.  No adenopathy. Thyroid gland normal.   IMPRESSION: No acute or significant findings.   CT ABDOMEN AND PELVIS   Findings:   Liver, spleen, pancreas, and adrenal glands normal. There are multiple gallstones.  No adenopathy or ascites.  No hemoperitoneum.  There is heavy aortoiliac calcification without aneurysm.   There are several small, nonobstructing right renal calculi.  There are multiple bilateral renal cysts.  The largest is an exophytic cyst emanating off the upper pole of the left kidney, measuring 5.5 cm.   No inflammatory or post-traumatic changes noted to the GI tract.   No fractures of the spine or the bony pelvis.  There are degenerative changes of the lumbar spine, with degenerative disc disease at L3-4.   IMPRESSION:   1.  Cholelithiasis. 2.  Small, nonobstructing right renal calculi. 3.  Multiple bilateral simple renal cysts. 4.  No acute or significant post traumatic findings. 5.  Heavy aortoiliac calcification.  Provider: Jory Sims, [place]  Dg Shoulder Left  03/26/2010   Clinical Data: Fall with left shoulder injury.  LEFT SHOULDER - 2+ VIEW   Comparison: None.   Findings: No evidence of acute fracture or dislocation.  Moderate degenerative changes seen involving the [facility] joint and glenohumeral joint.  There is  decreased acromiohumeral distance which may be consistent with rotator cuff pathology/impingement.  Soft tissues are unremarkable.   IMPRESSION: No acute findings.  Possible underlying rotator cuff pathology.  Provider: Brooke Dare   Assessment & Plan:   Terry Sloan was seen today for annual exam.  Diagnoses and all orders for this visit:  Phimosis -     CBC with Differential/Platelet -     CMP14+EGFR -     Ambulatory referral to Urology  Wellness examination -     CBC with Differential/Platelet -     CMP14+EGFR -     Lipid panel -     Cancel: TSH -     Cancel: T4, Free  Prostate enlargement -     CBC with Differential/Platelet -     CMP14+EGFR -     PSA, total and free -     Ambulatory referral to Urology  Vitamin D deficiency -     CBC with Differential/Platelet -      CMP14+EGFR -     Vit D  25 hydroxy (rtn osteoporosis monitoring)  Hyperlipidemia  Screening for malignant neoplasm of the rectum -     Fecal occult blood, imunochemical   I have discontinued Terry Sloan's furosemide and HYDROcodone-acetaminophen.  No orders of the defined types were placed in this encounter.     Follow-up: Return in about 1 year (around 02/27/2016), or if symptoms worsen or fail to improve.  Claretta Fraise, M.D.

## 2015-02-27 NOTE — Addendum Note (Signed)
Addended by: Mechele Claude on: 02/27/2015 07:48 PM   Modules accepted: Kipp Brood

## 2015-02-28 ENCOUNTER — Other Ambulatory Visit: Payer: Self-pay | Admitting: Family Medicine

## 2015-02-28 LAB — CBC WITH DIFFERENTIAL/PLATELET
BASOS: 1 %
Basophils Absolute: 0 10*3/uL (ref 0.0–0.2)
EOS (ABSOLUTE): 0.1 10*3/uL (ref 0.0–0.4)
Eos: 2 %
Hematocrit: 40.2 % (ref 37.5–51.0)
Hemoglobin: 13.5 g/dL (ref 12.6–17.7)
Immature Grans (Abs): 0 10*3/uL (ref 0.0–0.1)
Immature Granulocytes: 0 %
LYMPHS ABS: 1.2 10*3/uL (ref 0.7–3.1)
Lymphs: 25 %
MCH: 31.7 pg (ref 26.6–33.0)
MCHC: 33.6 g/dL (ref 31.5–35.7)
MCV: 94 fL (ref 79–97)
MONOS ABS: 0.5 10*3/uL (ref 0.1–0.9)
Monocytes: 11 %
Neutrophils Absolute: 2.9 10*3/uL (ref 1.4–7.0)
Neutrophils: 61 %
PLATELETS: 216 10*3/uL (ref 150–379)
RBC: 4.26 x10E6/uL (ref 4.14–5.80)
RDW: 13.8 % (ref 12.3–15.4)
WBC: 4.8 10*3/uL (ref 3.4–10.8)

## 2015-02-28 LAB — CMP14+EGFR
A/G RATIO: 2.2 (ref 1.1–2.5)
ALT: 13 IU/L (ref 0–44)
AST: 18 IU/L (ref 0–40)
Albumin: 4.2 g/dL (ref 3.5–4.8)
Alkaline Phosphatase: 76 IU/L (ref 39–117)
BILIRUBIN TOTAL: 0.5 mg/dL (ref 0.0–1.2)
BUN/Creatinine Ratio: 15 (ref 10–22)
BUN: 19 mg/dL (ref 8–27)
CHLORIDE: 106 mmol/L (ref 97–108)
CO2: 22 mmol/L (ref 18–29)
Calcium: 9.1 mg/dL (ref 8.6–10.2)
Creatinine, Ser: 1.24 mg/dL (ref 0.76–1.27)
GFR calc Af Amer: 65 mL/min/{1.73_m2} (ref >59)
GFR calc non Af Amer: 57 mL/min/{1.73_m2} — ABNORMAL LOW (ref >59)
Globulin, Total: 1.9 g/dL (ref 1.5–4.5)
Glucose: 94 mg/dL (ref 65–99)
POTASSIUM: 4 mmol/L (ref 3.5–5.2)
Sodium: 144 mmol/L (ref 134–144)
Total Protein: 6.1 g/dL (ref 6.0–8.5)

## 2015-02-28 LAB — PSA, TOTAL AND FREE
PROSTATE SPECIFIC AG, SERUM: 2.4 ng/mL (ref 0.0–4.0)
PSA, Free Pct: 40.4 %
PSA, Free: 0.97 ng/mL

## 2015-02-28 LAB — LIPID PANEL
Chol/HDL Ratio: 6.8 ratio units — ABNORMAL HIGH (ref 0.0–5.0)
Cholesterol, Total: 211 mg/dL — ABNORMAL HIGH (ref 100–199)
HDL: 31 mg/dL — AB (ref >39)
LDL Calculated: 133 mg/dL — ABNORMAL HIGH (ref 0–99)
Triglycerides: 237 mg/dL — ABNORMAL HIGH (ref 0–149)
VLDL Cholesterol Cal: 47 mg/dL — ABNORMAL HIGH (ref 5–40)

## 2015-02-28 LAB — VITAMIN D 25 HYDROXY (VIT D DEFICIENCY, FRACTURES): VIT D 25 HYDROXY: 19.3 ng/mL — AB (ref 30.0–100.0)

## 2015-02-28 MED ORDER — VITAMIN D (ERGOCALCIFEROL) 1.25 MG (50000 UNIT) PO CAPS: 50000.0000 [IU] | ORAL_CAPSULE | ORAL | Status: DC

## 2015-02-28 MED ORDER — FENOFIBRATE 145 MG PO TABS: 145.0000 mg | ORAL_TABLET | Freq: Every day | ORAL | Status: DC

## 2015-03-01 LAB — FECAL OCCULT BLOOD, IMMUNOCHEMICAL: Fecal Occult Bld: NEGATIVE

## 2015-03-20 DIAGNOSIS — R3989 Other symptoms and signs involving the genitourinary system: Secondary | ICD-10-CM | POA: Diagnosis not present

## 2015-03-20 DIAGNOSIS — R3129 Other microscopic hematuria: Secondary | ICD-10-CM | POA: Diagnosis not present

## 2015-03-20 DIAGNOSIS — N471 Phimosis: Secondary | ICD-10-CM | POA: Diagnosis not present

## 2015-03-20 DIAGNOSIS — R39198 Other difficulties with micturition: Secondary | ICD-10-CM | POA: Diagnosis not present

## 2015-03-21 DIAGNOSIS — R3129 Other microscopic hematuria: Secondary | ICD-10-CM | POA: Diagnosis not present

## 2015-04-06 DIAGNOSIS — Z79899 Other long term (current) drug therapy: Secondary | ICD-10-CM | POA: Diagnosis not present

## 2015-04-06 DIAGNOSIS — N471 Phimosis: Secondary | ICD-10-CM | POA: Diagnosis not present

## 2015-04-06 DIAGNOSIS — N481 Balanitis: Secondary | ICD-10-CM | POA: Diagnosis not present

## 2015-04-06 DIAGNOSIS — Z888 Allergy status to other drugs, medicaments and biological substances status: Secondary | ICD-10-CM | POA: Diagnosis not present

## 2015-04-06 DIAGNOSIS — R39198 Other difficulties with micturition: Secondary | ICD-10-CM | POA: Diagnosis not present

## 2015-04-06 DIAGNOSIS — Z981 Arthrodesis status: Secondary | ICD-10-CM | POA: Diagnosis not present

## 2015-04-06 DIAGNOSIS — R3989 Other symptoms and signs involving the genitourinary system: Secondary | ICD-10-CM | POA: Diagnosis not present

## 2015-04-06 DIAGNOSIS — M1990 Unspecified osteoarthritis, unspecified site: Secondary | ICD-10-CM | POA: Diagnosis not present

## 2015-04-06 DIAGNOSIS — R3129 Other microscopic hematuria: Secondary | ICD-10-CM | POA: Diagnosis not present

## 2015-04-06 DIAGNOSIS — F17200 Nicotine dependence, unspecified, uncomplicated: Secondary | ICD-10-CM | POA: Diagnosis not present

## 2015-04-10 ENCOUNTER — Telehealth: Payer: Self-pay | Admitting: Family Medicine

## 2015-04-10 NOTE — Telephone Encounter (Signed)
Pt took Vit D rx twice a day instead of twice a week and is now out should he get another rx, will insurance pay for it, and then he says the tricor is giving him heartburn can we decrease the dose? Please advise.

## 2015-04-10 NOTE — Telephone Encounter (Signed)
Not likely to be effective. If can not tolerate, DC and I will send in something else.

## 2015-04-11 ENCOUNTER — Other Ambulatory Visit: Payer: Self-pay | Admitting: Family Medicine

## 2015-04-11 MED ORDER — PRAVASTATIN SODIUM 40 MG PO TABS: 40.0000 mg | ORAL_TABLET | Freq: Every day | ORAL | Status: DC

## 2015-04-11 NOTE — Telephone Encounter (Signed)
Discontinue the TriCor/fenofibrate. I sent in a prescription for pravastatin. That should work equally well. He should be seen in 3 months for a recheck of his cholesterol and liver function testing. Have him drop by a couple of days early for blood work. He probably had heartburn from the vitamin D. I would not recommend repeating the dose. The high dose can be toxic. I recommend that he not take any vitamin D for about 6 weeks. At that time he should start taking 2000 units over-the-counter daily.

## 2015-04-11 NOTE — Telephone Encounter (Signed)
Please review and advise.

## 2015-04-12 NOTE — Telephone Encounter (Signed)
Spoke with pt's daughter and gave recommendation Verbalizes understanding

## 2015-05-09 ENCOUNTER — Telehealth: Payer: Self-pay | Admitting: Family Medicine

## 2015-05-11 NOTE — Telephone Encounter (Signed)
Sending out FOBT kit

## 2015-05-31 ENCOUNTER — Other Ambulatory Visit: Payer: Commercial Managed Care - HMO

## 2015-05-31 DIAGNOSIS — Z1212 Encounter for screening for malignant neoplasm of rectum: Secondary | ICD-10-CM

## 2015-05-31 NOTE — Progress Notes (Signed)
Lab only 

## 2015-06-02 LAB — FECAL OCCULT BLOOD, IMMUNOCHEMICAL: FECAL OCCULT BLD: NEGATIVE

## 2015-06-09 DIAGNOSIS — R3129 Other microscopic hematuria: Secondary | ICD-10-CM | POA: Diagnosis not present

## 2015-06-09 DIAGNOSIS — R39198 Other difficulties with micturition: Secondary | ICD-10-CM | POA: Diagnosis not present

## 2015-06-09 DIAGNOSIS — N401 Enlarged prostate with lower urinary tract symptoms: Secondary | ICD-10-CM | POA: Diagnosis not present

## 2015-06-09 DIAGNOSIS — R3989 Other symptoms and signs involving the genitourinary system: Secondary | ICD-10-CM | POA: Diagnosis not present

## 2015-06-09 DIAGNOSIS — N471 Phimosis: Secondary | ICD-10-CM | POA: Diagnosis not present

## 2015-10-11 ENCOUNTER — Other Ambulatory Visit: Payer: Self-pay | Admitting: Family Medicine

## 2015-10-23 ENCOUNTER — Telehealth: Payer: Self-pay | Admitting: Family Medicine

## 2015-10-24 NOTE — Telephone Encounter (Signed)
[facility]

## 2015-10-31 DIAGNOSIS — M1732 Unilateral post-traumatic osteoarthritis, left knee: Secondary | ICD-10-CM | POA: Diagnosis not present

## 2015-10-31 DIAGNOSIS — M25562 Pain in left knee: Secondary | ICD-10-CM | POA: Diagnosis not present

## 2016-02-02 ENCOUNTER — Encounter: Payer: Self-pay | Admitting: Family Medicine

## 2016-02-02 ENCOUNTER — Ambulatory Visit (INDEPENDENT_AMBULATORY_CARE_PROVIDER_SITE_OTHER): Payer: Commercial Managed Care - HMO | Admitting: Family Medicine

## 2016-02-02 VITALS — BP 138/69 | HR 59 | Temp 98.4°F | Ht 69.0 in | Wt 205.0 lb

## 2016-02-02 DIAGNOSIS — R208 Other disturbances of skin sensation: Secondary | ICD-10-CM

## 2016-02-02 DIAGNOSIS — R200 Anesthesia of skin: Secondary | ICD-10-CM

## 2016-02-02 MED ORDER — SILDENAFIL CITRATE 20 MG PO TABS: ORAL_TABLET | ORAL | 5 refills | Status: DC

## 2016-02-02 MED ORDER — PREDNISONE 10 MG PO TABS: ORAL_TABLET | ORAL | 0 refills | Status: DC

## 2016-02-02 NOTE — Progress Notes (Signed)
Subjective:  Patient ID: Terry Sloan, male    DOB: [birth year]  Age: 75 y.o. MRN: [id number]  CC: Numbness (bilateral legs)   HPI Terry Sloan presents for Two episodes of numbness in thhe legs. First occurring three weeks ago. Felt like floating. Legs very weka as well. Lasted 5-10 min. Strength came back as well. Symptoms are qual biltaterally Second episode on August 19. None since sx virtually identical to first episode. Waist down only. Nothing focal/ lateralizing. Pt. Denies pain. Minimal tingling but otherwise legs just felt like not even there. Denies prodrome or aftermath. No clonic tonic activity. No LOC. No GI or resp sx. SDenies HA.    History Terry Sloan has no past medical history on file.   He has no past surgical history on file.   His family history is not on file.He reports that he has quit smoking. He has never used smokeless tobacco. He reports that he does not drink alcohol or use drugs.    ROS Review of Systems  Constitutional: Negative for chills, diaphoresis, fever and unexpected weight change.  HENT: Negative for congestion, hearing loss, rhinorrhea and sore throat.   Eyes: Negative for visual disturbance.  Respiratory: Negative for cough and shortness of breath.   Cardiovascular: Negative for chest pain.  Gastrointestinal: Negative for abdominal pain, constipation and diarrhea.  Genitourinary: Negative for dysuria and flank pain.  Musculoskeletal: Negative for arthralgias and joint swelling.  Skin: Negative for rash.  Neurological: Negative for dizziness and headaches.  Psychiatric/Behavioral: Negative for dysphoric mood and sleep disturbance.    Objective:  BP 138/69 (BP Location: Left Arm)   Pulse (!) 59   Temp 98.4 F (36.9 C) (Oral)   Ht 5' 9"  (1.753 m)   Wt 205 lb (93 kg)   BMI 30.27 kg/m   BP Readings from Last 3 Encounters:  02/02/16 138/69  02/27/15 138/70  03/25/14 122/70    Wt Readings from Last 3 Encounters:  02/02/16 205 lb (93 kg)    02/27/15 213 lb 3.2 oz (96.7 kg)  03/25/14 211 lb (95.7 kg)     Physical Exam  Constitutional: He is oriented to person, place, and time. He appears well-developed and well-nourished. No distress.  HENT:  Head: Normocephalic and atraumatic.  Right Ear: External ear normal.  Left Ear: External ear normal.  Nose: Nose normal.  Mouth/Throat: Oropharynx is clear and moist.  Eyes: Conjunctivae and EOM are normal. Pupils are equal, round, and reactive to light.  Neck: Normal range of motion. Neck supple. No thyromegaly present.  Cardiovascular: Normal rate, regular rhythm and normal heart sounds.   No murmur heard. Pulmonary/Chest: Effort normal and breath sounds normal. No respiratory distress. He has no wheezes. He has no rales.  Abdominal: Soft. Bowel sounds are normal. He exhibits no distension. There is no tenderness.  Lymphadenopathy:    He has no cervical adenopathy.  Neurological: He is alert and oriented to person, place, and time. He has normal reflexes.  Skin: Skin is warm and dry.  Psychiatric: He has a normal mood and affect. His behavior is normal. Judgment and thought content normal.     Lab Results  Component Value Date   WBC 4.8 02/27/2015   HGB 13.4 (A) 01/10/2014   HCT 40.2 02/27/2015   PLT 216 02/27/2015   GLUCOSE 94 02/27/2015   CHOL 211 (H) 02/27/2015   TRIG 237 (H) 02/27/2015   HDL 31 (L) 02/27/2015   LDLCALC 133 (H) 02/27/2015   ALT 13  02/27/2015   AST 18 02/27/2015   NA 144 02/27/2015   K 4.0 02/27/2015   CL 106 02/27/2015   CREATININE 1.24 02/27/2015   BUN 19 02/27/2015   CO2 22 02/27/2015   INR 1.02 03/26/2010     Assessment & Plan:   Terry Sloan was seen today for numbness.  Diagnoses and all orders for this visit:  Numbness in both legs -     NCV with EMG(electromyography); Future -     MR Lumbar Spine Wo Contrast; Future -     CBC with Differential/Platelet -     CMP14+EGFR -     Sedimentation rate  Other orders -     predniSONE  (DELTASONE) 10 MG tablet; Take 5 daily for 3 days followed by 4,3,2 and 1 for 3 days each. -     sildenafil (REVATIO) 20 MG tablet; 2-5 daily prn sex    Symptoms and exam are more consistent with neurologic lesion than other systems. No sign of infection or GB (not progressive, rather eepisodic.) Resembles Spinal cord lesion or cerebral. MS possible, but distribution as well as consistent location speaks against this possibility.   I am having Terry Sloan start on predniSONE and sildenafil. I am also having him maintain his Vitamin D (Ergocalciferol) and pravastatin.  Meds ordered this encounter  Medications  . predniSONE (DELTASONE) 10 MG tablet    Sig: Take 5 daily for 3 days followed by 4,3,2 and 1 for 3 days each.    Dispense:  45 tablet    Refill:  0  . sildenafil (REVATIO) 20 MG tablet    Sig: 2-5 daily prn sex    Dispense:  50 tablet    Refill:  5     Follow-up: Return in about 2 weeks (around 02/16/2016).  Claretta Fraise, M.D.

## 2016-02-03 LAB — CBC WITH DIFFERENTIAL/PLATELET
BASOS: 1 %
Basophils Absolute: 0 10*3/uL (ref 0.0–0.2)
EOS (ABSOLUTE): 0.2 10*3/uL (ref 0.0–0.4)
Eos: 4 %
HEMATOCRIT: 40.9 % (ref 37.5–51.0)
Hemoglobin: 14.1 g/dL (ref 12.6–17.7)
IMMATURE GRANS (ABS): 0 10*3/uL (ref 0.0–0.1)
IMMATURE GRANULOCYTES: 0 %
LYMPHS: 30 %
Lymphocytes Absolute: 1.3 10*3/uL (ref 0.7–3.1)
MCH: 33.3 pg — ABNORMAL HIGH (ref 26.6–33.0)
MCHC: 34.5 g/dL (ref 31.5–35.7)
MCV: 97 fL (ref 79–97)
MONOS ABS: 0.7 10*3/uL (ref 0.1–0.9)
Monocytes: 15 %
NEUTROS PCT: 50 %
Neutrophils Absolute: 2.3 10*3/uL (ref 1.4–7.0)
PLATELETS: 213 10*3/uL (ref 150–379)
RBC: 4.23 x10E6/uL (ref 4.14–5.80)
RDW: 14 % (ref 12.3–15.4)
WBC: 4.5 10*3/uL (ref 3.4–10.8)

## 2016-02-03 LAB — CMP14+EGFR
A/G RATIO: 2.2 (ref 1.2–2.2)
ALT: 16 IU/L (ref 0–44)
AST: 19 IU/L (ref 0–40)
Albumin: 4.3 g/dL (ref 3.5–4.8)
Alkaline Phosphatase: 69 IU/L (ref 39–117)
BUN/Creatinine Ratio: 14 (ref 10–24)
BUN: 18 mg/dL (ref 8–27)
Bilirubin Total: 0.5 mg/dL (ref 0.0–1.2)
CALCIUM: 8.8 mg/dL (ref 8.6–10.2)
CO2: 23 mmol/L (ref 18–29)
CREATININE: 1.31 mg/dL — AB (ref 0.76–1.27)
Chloride: 105 mmol/L (ref 96–106)
GFR, EST AFRICAN AMERICAN: 61 mL/min/{1.73_m2} (ref >59)
GFR, EST NON AFRICAN AMERICAN: 53 mL/min/{1.73_m2} — AB (ref >59)
Globulin, Total: 2 g/dL (ref 1.5–4.5)
Glucose: 93 mg/dL (ref 65–99)
Potassium: 4.2 mmol/L (ref 3.5–5.2)
Sodium: 143 mmol/L (ref 134–144)
TOTAL PROTEIN: 6.3 g/dL (ref 6.0–8.5)

## 2016-02-03 LAB — SEDIMENTATION RATE: Sed Rate: 2 mm/hr (ref 0–30)

## 2016-02-06 ENCOUNTER — Telehealth: Payer: Self-pay | Admitting: Family Medicine

## 2016-02-06 ENCOUNTER — Other Ambulatory Visit: Payer: Self-pay | Admitting: *Deleted

## 2016-02-06 MED ORDER — SILDENAFIL CITRATE 20 MG PO TABS: ORAL_TABLET | ORAL | 5 refills | Status: DC

## 2016-02-06 NOTE — Telephone Encounter (Signed)
Prescription sent to the drug store in [place]

## 2016-02-16 ENCOUNTER — Ambulatory Visit (INDEPENDENT_AMBULATORY_CARE_PROVIDER_SITE_OTHER): Payer: Commercial Managed Care - HMO | Admitting: Family Medicine

## 2016-02-16 ENCOUNTER — Encounter: Payer: Self-pay | Admitting: Family Medicine

## 2016-02-16 VITALS — BP 136/67 | HR 78 | Temp 97.2°F | Ht 69.0 in | Wt 205.0 lb

## 2016-02-16 DIAGNOSIS — R200 Anesthesia of skin: Secondary | ICD-10-CM

## 2016-02-16 DIAGNOSIS — R208 Other disturbances of skin sensation: Secondary | ICD-10-CM | POA: Diagnosis not present

## 2016-02-16 NOTE — Progress Notes (Signed)
Subjective:  Patient ID: Terry Sloan, male    DOB: [birth year]  Age: 75 y.o. MRN: [id number]  CC: 2 week recheck (pt here today following up on numbness in both legs. Pt states it is much better with the steroids.)   HPI Terry Sloan presents for The numbness went away after 2-3 days of using the prednisone. He is just finishing up last dose tomorrow.   History Terry Sloan has no past medical history on file.   He has no past surgical history on file.   His family history is not on file.He reports that he has quit smoking. He has never used smokeless tobacco. He reports that he does not drink alcohol or use drugs.    ROS Review of Systems  Constitutional: Negative for chills, diaphoresis and fever.  HENT: Negative for rhinorrhea and sore throat.   Respiratory: Negative for cough and shortness of breath.   Cardiovascular: Negative for chest pain.  Gastrointestinal: Negative for abdominal pain.  Musculoskeletal: Negative for arthralgias and myalgias.  Skin: Negative for rash.  Neurological: Negative for weakness and headaches.    Objective:  BP 136/67   Pulse 78   Temp 97.2 F (36.2 C) (Oral)   Ht 5\' 9"  (1.753 m)   Wt 205 lb (93 kg)   BMI 30.27 kg/m   BP Readings from Last 3 Encounters:  02/16/16 136/67  02/02/16 138/69  02/27/15 138/70    Wt Readings from Last 3 Encounters:  02/16/16 205 lb (93 kg)  02/02/16 205 lb (93 kg)  02/27/15 213 lb 3.2 oz (96.7 kg)     Physical Exam  Constitutional: He appears well-developed and well-nourished.  HENT:  Head: Normocephalic and atraumatic.  Right Ear: Tympanic membrane and external ear normal. No decreased hearing is noted.  Left Ear: Tympanic membrane and external ear normal. No decreased hearing is noted.  Mouth/Throat: No oropharyngeal exudate or posterior oropharyngeal erythema.  Eyes: Pupils are equal, round, and reactive to light.  Neck: Normal range of motion. Neck supple.  Cardiovascular: Normal rate and regular  rhythm.   No murmur heard. Pulmonary/Chest: Breath sounds normal. No respiratory distress.  Abdominal: Soft. Bowel sounds are normal. He exhibits no mass. There is no tenderness.  Vitals reviewed.    Lab Results  Component Value Date   WBC 4.5 02/02/2016   HGB 13.4 (A) 01/10/2014   HCT 40.9 02/02/2016   PLT 213 02/02/2016   GLUCOSE 93 02/02/2016   CHOL 211 (H) 02/27/2015   TRIG 237 (H) 02/27/2015   HDL 31 (L) 02/27/2015   LDLCALC 133 (H) 02/27/2015   ALT 16 02/02/2016   AST 19 02/02/2016   NA 143 02/02/2016   K 4.2 02/02/2016   CL 105 02/02/2016   CREATININE 1.31 (H) 02/02/2016   BUN 18 02/02/2016   CO2 23 02/02/2016   INR 1.02 03/26/2010      Assessment & Plan:   Terry Sloan was seen today for 2 week recheck.  Diagnoses and all orders for this visit:  Numbness in both legs  Other orders -     diclofenac (VOLTAREN) 75 MG EC tablet; Take 1 tablet (75 mg total) by mouth 2 (two) times daily. For muscle and  Joint pain     I have discontinued [name]'s predniSONE. I am also having him start on diclofenac. Additionally, I am having him maintain his Vitamin D (Ergocalciferol), pravastatin, and sildenafil.  Meds ordered this encounter  Medications  . diclofenac (VOLTAREN) 75 MG EC tablet  Sig: Take 1 tablet (75 mg total) by mouth 2 (two) times daily. For muscle and  Joint pain    Dispense:  60 tablet    Refill:  2     Follow-up: Return in about 3 months (around 05/17/2016).  Mechele Claude, M.D.

## 2016-02-17 ENCOUNTER — Telehealth: Payer: Self-pay | Admitting: Family Medicine

## 2016-02-17 NOTE — Telephone Encounter (Signed)
Patient was seen on 02/16/16 and was expecting a new medication to be sent to the pharmacy. I didn't see this documented and the patient said it would be ok to wait for Dr Darlyn ReadStacks to address on Monday.   They also mentioned that his MRI is supposed to be cancelled. If this is accurate please have the referral department call to cancel the scan.

## 2016-02-19 MED ORDER — DICLOFENAC SODIUM 75 MG PO TBEC: 75.0000 mg | DELAYED_RELEASE_TABLET | Freq: Two times a day (BID) | ORAL | 2 refills | Status: DC

## 2016-02-19 NOTE — Telephone Encounter (Signed)
I sent in the requested prescription 

## 2016-02-19 NOTE — Telephone Encounter (Signed)
Patient aware medication has been sent to pharmacy.

## 2016-02-21 ENCOUNTER — Ambulatory Visit (HOSPITAL_COMMUNITY): Payer: Commercial Managed Care - HMO

## 2016-03-08 DIAGNOSIS — N471 Phimosis: Secondary | ICD-10-CM | POA: Diagnosis not present

## 2016-03-08 DIAGNOSIS — R39198 Other difficulties with micturition: Secondary | ICD-10-CM | POA: Diagnosis not present

## 2016-04-30 ENCOUNTER — Telehealth: Payer: Self-pay | Admitting: Family Medicine

## 2016-04-30 NOTE — Telephone Encounter (Signed)
Release received and will be completed when [facility] comes Thursday 05/02/16

## 2016-05-16 ENCOUNTER — Other Ambulatory Visit: Payer: Self-pay | Admitting: Family Medicine

## 2016-07-31 ENCOUNTER — Ambulatory Visit (INDEPENDENT_AMBULATORY_CARE_PROVIDER_SITE_OTHER): Payer: Medicare HMO | Admitting: Family Medicine

## 2016-07-31 ENCOUNTER — Encounter: Payer: Self-pay | Admitting: Family Medicine

## 2016-07-31 VITALS — BP 130/69 | HR 75 | Temp 98.0°F | Ht 69.0 in | Wt 205.0 lb

## 2016-07-31 DIAGNOSIS — L308 Other specified dermatitis: Secondary | ICD-10-CM | POA: Diagnosis not present

## 2016-07-31 DIAGNOSIS — I739 Peripheral vascular disease, unspecified: Secondary | ICD-10-CM

## 2016-07-31 DIAGNOSIS — R42 Dizziness and giddiness: Secondary | ICD-10-CM

## 2016-07-31 MED ORDER — PRAVASTATIN SODIUM 40 MG PO TABS: ORAL_TABLET | ORAL | 3 refills | Status: DC

## 2016-07-31 MED ORDER — FLUOCINONIDE-E 0.05 % EX CREA: 1.0000 "application " | TOPICAL_CREAM | Freq: Two times a day (BID) | CUTANEOUS | 5 refills | Status: DC

## 2016-07-31 NOTE — Progress Notes (Signed)
Subjective:  Patient ID: DIERKS WACH, male    DOB: 21-Oct-1940  Age: 76 y.o. MRN: [id number]  CC: Rash (pt here today c/o rash on his left leg and also having dizzy spells when he bends over.)   HPI TEODOR PRATER presents for 3 episodes recently of dizziness when bending over. Gets better after a minute or so. Never drinks water because it causes heartburn. Gets light headed. No vertigo. Feels he might pass out momentarrily.  Pruritic rash on leg at ankle. It is recurrent. Flares periodically. Uses a topical on it. Worked in the past. Would like renewal or permanent cure.   History Jansen has no past medical history on file.   He has no past surgical history on file.   His family history is not on file.He reports that he has quit smoking. He has never used smokeless tobacco. He reports that he does not drink alcohol or use drugs.    ROS Review of Systems  Constitutional: Negative for chills, diaphoresis and fever.  HENT: Negative for rhinorrhea and sore throat.   Respiratory: Negative for cough and shortness of breath.   Cardiovascular: Negative for chest pain.  Gastrointestinal: Negative for abdominal pain.  Musculoskeletal: Negative for arthralgias and myalgias.  Skin: Positive for rash.  Neurological: Negative for weakness and headaches.    Objective:  BP 130/69   Pulse 75   Temp 98 F (36.7 C) (Oral)   Ht  (1.753 m)   Wt 205 lb (93 kg)   BMI 30.27 kg/m   BP Readings from Last 3 Encounters:  07/31/16 130/69  02/16/16 136/67  02/02/16 138/69    Wt Readings from Last 3 Encounters:  07/31/16 205 lb (93 kg)  02/16/16 205 lb (93 kg)  02/02/16 205 lb (93 kg)     Physical Exam  Constitutional: He is oriented to person, place, and time. He appears well-developed and well-nourished.  HENT:  Head: Normocephalic and atraumatic.  Right Ear: External ear normal.  Left Ear: External ear normal.  Mouth/Throat: No oropharyngeal exudate or posterior oropharyngeal  erythema.  Eyes: Pupils are equal, round, and reactive to light.  Neck: Normal range of motion. Neck supple.  Cardiovascular: Normal rate and regular rhythm.   No murmur heard. Pulmonary/Chest: Breath sounds normal. No respiratory distress.  Neurological: He is alert and oriented to person, place, and time.  Skin: Skin is warm and dry. Rash (maculopapular, left lower leg at ankle) noted. There is erythema (There is diffuse erythema with mild edema of the area as well. Iron staing is prominent).  Vitals reviewed.     Assessment & Plan:   Varick was seen today for rash.  Diagnoses and all orders for this visit:  Dizziness  PVD (peripheral vascular disease) (HCC) -     Compression stockings  Other eczema  Other orders -     pravastatin (PRAVACHOL) 40 MG tablet; TAKE ONE (1) TABLET EACH DAY -     fluocinonide-emollient (LIDEX-E) 0.05 % cream; Apply 1 application topically 2 (two) times daily. To affected areas    Pt. Advised that he needs to drink water regularly to avoid orthostatic episodes. Try bottled purified water. If heartburn persists consider PPI/EGD.   I have discontinued Mr. Henricksen's Vitamin D (Ergocalciferol), sildenafil, and diclofenac. I am also having him start on fluocinonide-emollient. Additionally, I am having him maintain his pravastatin.  Allergies as of 07/31/2016      Reactions   Celebrex [celecoxib]  Medication List       Accurate as of 07/31/16 11:59 PM. Always use your most recent med list.          fluocinonide-emollient 0.05 % cream Commonly known as:  LIDEX-E Apply 1 application topically 2 (two) times daily. To affected areas   pravastatin 40 MG tablet Commonly known as:  PRAVACHOL TAKE ONE (1) TABLET EACH DAY        Follow-up: Return in about 3 months (around 10/28/2016) for cholesterol, PVD.  Mechele Claude, M.D.

## 2016-09-23 ENCOUNTER — Encounter: Payer: Self-pay | Admitting: Family Medicine

## 2016-09-23 ENCOUNTER — Ambulatory Visit (INDEPENDENT_AMBULATORY_CARE_PROVIDER_SITE_OTHER): Payer: Medicare HMO | Admitting: Family Medicine

## 2016-09-23 ENCOUNTER — Ambulatory Visit (INDEPENDENT_AMBULATORY_CARE_PROVIDER_SITE_OTHER): Payer: Medicare HMO

## 2016-09-23 VITALS — BP 103/59 | HR 79 | Temp 97.9°F | Ht 69.0 in | Wt 184.0 lb

## 2016-09-23 DIAGNOSIS — R634 Abnormal weight loss: Secondary | ICD-10-CM | POA: Insufficient documentation

## 2016-09-23 DIAGNOSIS — I739 Peripheral vascular disease, unspecified: Secondary | ICD-10-CM | POA: Diagnosis not present

## 2016-09-23 DIAGNOSIS — F419 Anxiety disorder, unspecified: Secondary | ICD-10-CM

## 2016-09-23 DIAGNOSIS — E782 Mixed hyperlipidemia: Secondary | ICD-10-CM | POA: Diagnosis not present

## 2016-09-23 DIAGNOSIS — F329 Major depressive disorder, single episode, unspecified: Secondary | ICD-10-CM | POA: Diagnosis not present

## 2016-09-23 MED ORDER — DULOXETINE HCL 30 MG PO CPEP: 30.0000 mg | ORAL_CAPSULE | Freq: Every day | ORAL | 1 refills | Status: DC

## 2016-09-23 MED ORDER — MUPIROCIN 2 % EX OINT: 1.0000 "application " | TOPICAL_OINTMENT | Freq: Two times a day (BID) | CUTANEOUS | 0 refills | Status: DC

## 2016-09-23 NOTE — Progress Notes (Signed)
Your chest x-ray looked normal. Thanks, WS.

## 2016-09-23 NOTE — Progress Notes (Signed)
Subjective:  Patient ID: Terry Sloan, male    DOB: 02/28/1941  Age: 76 y.o. MRN: [id number]  CC: Anxiety (pt here today c/o increased stress due to financial situations. He has lost 21lbs in a little under 2 months.  He is also very concerned about his left leg.)   HPI Terry Sloan presents for Head to work due to poor financial return. He is now very anxious and worried about back taxes and selling his equipment as well as future income. He is worried about the impact that will have on his live-in girlfriend. She's been with him 23 years and she is 77 years old to his 6. They have an 61 year old daughter. Patient smoked until he met her. He has been a nonsmoker 23 years. At this time he is quite anxious about his future, depressed and worried. He has lost 19 pounds recently without trying. He says his appetite is good. He denies any cough or shortness of breath. He has not had any melena or hematochezia. He never had the NCV/EMG that was ordered 6 months ago.   History Terry Sloan has a past medical history of Hyperlipidemia and PVD (peripheral vascular disease) ([place]).   He has no past surgical history on file.   His family history is not on file.He reports that he has quit smoking. He has never used smokeless tobacco. He reports that he does not drink alcohol or use drugs.    ROS Review of Systems  Constitutional: Positive for activity change. Negative for appetite change, chills, diaphoresis and fever.  HENT: Negative for rhinorrhea, sore throat and trouble swallowing.   Respiratory: Negative for cough and shortness of breath.   Cardiovascular: Negative for chest pain.  Gastrointestinal: Negative for abdominal pain, constipation and diarrhea.  Endocrine: Negative for cold intolerance, heat intolerance, polydipsia and polyphagia.  Genitourinary: Negative for hematuria.  Musculoskeletal: Negative for arthralgias and myalgias.  Skin: Positive for wound (he has 2 crusted lesions on the left  shi). Negative for rash.  Neurological: Negative for dizziness, weakness and headaches.  Psychiatric/Behavioral: Positive for dysphoric mood. Negative for sleep disturbance and suicidal ideas. The patient is nervous/anxious.     Objective:  BP (!) 103/59   Pulse 79   Temp 97.9 F (36.6 C) (Oral)   Ht _0  (1.753 m)   Wt 184 lb (83.5 kg)   BMI 27.17 kg/m   BP Readings from Last 3 Encounters:  09/23/16 (!) 103/59  07/31/16 130/69  02/16/16 136/67    Wt Readings from Last 3 Encounters:  09/23/16 184 lb (83.5 kg)  07/31/16 205 lb (93 kg)  02/16/16 205 lb (93 kg)     Physical Exam  Constitutional: He is oriented to person, place, and time. He appears well-developed and well-nourished. No distress.  HENT:  Head: Normocephalic and atraumatic.  Right Ear: External ear normal.  Left Ear: External ear normal.  Nose: Nose normal.  Mouth/Throat: Oropharynx is clear and moist.  Eyes: Conjunctivae and EOM are normal. Pupils are equal, round, and reactive to light.  Neck: Normal range of motion. Neck supple. No thyromegaly present.  Cardiovascular: Normal rate, regular rhythm and normal heart sounds.   No murmur heard. Pulmonary/Chest: Effort normal and breath sounds normal. No respiratory distress. He has no wheezes. He has no rales.  Abdominal: Soft. There is no tenderness.  Lymphadenopathy:    He has no cervical adenopathy.  Neurological: He is alert and oriented to person, place, and time. He has normal reflexes.  Skin: Skin is warm and dry. Abrasion (X2 at left mid shin. each is crusted. Each measures 1 cm. No erythema, edema or pourulence.) noted.  Psychiatric: Judgment and thought content normal. His mood appears anxious. His affect is blunt. His speech is delayed. He is slowed and withdrawn.      Assessment & Plan:   Terry Sloan was seen today for anxiety.  Diagnoses and all orders for this visit:  PVD (peripheral vascular disease) ([place]) -     VAS [place] LOWER EXTREMITY  ARTERIAL DUPLEX; Future -     CMP14+EGFR  Mixed hyperlipidemia -     CMP14+EGFR  Weight loss, unintentional -     DG Chest 2 View; Future -     CBC with Differential/Platelet -     CMP14+EGFR -     TSH  Anxiety and depression -     CMP14+EGFR -     TSH  Intermittent claudication (HCC) -     VAS [place] LOWER EXTREMITY ARTERIAL DUPLEX; Future -     CMP14+EGFR  Other orders -     mupirocin ointment (BACTROBAN) 2 %; Place 1 application into the nose 2 (two) times daily. -     DULoxetine (CYMBALTA) 30 MG capsule; Take 1 capsule (30 mg total) by mouth daily. Take with a full stomach at suppertime   Is 174/84 her blood pressure and I'll bring him back she is a and maybe following the last week and see    I am having Terry Sloan start on mupirocin ointment and DULoxetine. I am also having him maintain his pravastatin and fluocinonide-emollient.  Allergies as of 09/23/2016      Reactions   Celebrex [celecoxib]       Medication List       Accurate as of 09/23/16 11:50 AM. Always use your most recent med list.          DULoxetine 30 MG capsule Commonly known as:  CYMBALTA Take 1 capsule (30 mg total) by mouth daily. Take with a full stomach at suppertime   fluocinonide-emollient 0.05 % cream Commonly known as:  LIDEX-E Apply 1 application topically 2 (two) times daily. To affected areas   mupirocin ointment 2 % Commonly known as:  BACTROBAN Place 1 application into the nose 2 (two) times daily.   pravastatin 40 MG tablet Commonly known as:  PRAVACHOL TAKE ONE (1) TABLET EACH DAY      I am concerned that the weight loss since it is unintentional could be the result of an occult neoplasm. Chest x-ray and thyroid testing to be done today along with basic blood work. We will consider more in depth should the weight loss continue and not responding to treatment for depression. For the concern for MRSA, he was reassured. I recommended mupirocin and Band-Aids along with daily  cleansing of the 2 lesions noted on the shin.  Follow-up: Return in about 1 month (around 10/23/2016).  Claretta Fraise, M.D.

## 2016-09-24 LAB — CBC WITH DIFFERENTIAL/PLATELET
BASOS: 0 %
Basophils Absolute: 0 10*3/uL (ref 0.0–0.2)
EOS (ABSOLUTE): 0 10*3/uL (ref 0.0–0.4)
EOS: 1 %
HEMATOCRIT: 42.5 % (ref 37.5–51.0)
Hemoglobin: 14.3 g/dL (ref 13.0–17.7)
IMMATURE GRANS (ABS): 0 10*3/uL (ref 0.0–0.1)
Immature Granulocytes: 0 %
LYMPHS: 14 %
Lymphocytes Absolute: 0.8 10*3/uL (ref 0.7–3.1)
MCH: 31.9 pg (ref 26.6–33.0)
MCHC: 33.6 g/dL (ref 31.5–35.7)
MCV: 95 fL (ref 79–97)
MONOS ABS: 0.5 10*3/uL (ref 0.1–0.9)
Monocytes: 8 %
NEUTROS ABS: 4.2 10*3/uL (ref 1.4–7.0)
Neutrophils: 77 %
PLATELETS: 227 10*3/uL (ref 150–379)
RBC: 4.48 x10E6/uL (ref 4.14–5.80)
RDW: 14.1 % (ref 12.3–15.4)
WBC: 5.5 10*3/uL (ref 3.4–10.8)

## 2016-09-24 LAB — CMP14+EGFR
A/G RATIO: 1.9 (ref 1.2–2.2)
ALT: 13 IU/L (ref 0–44)
AST: 18 IU/L (ref 0–40)
Albumin: 4.1 g/dL (ref 3.5–4.8)
Alkaline Phosphatase: 90 IU/L (ref 39–117)
BILIRUBIN TOTAL: 0.6 mg/dL (ref 0.0–1.2)
BUN / CREAT RATIO: 13 (ref 10–24)
BUN: 16 mg/dL (ref 8–27)
CHLORIDE: 103 mmol/L (ref 96–106)
CO2: 24 mmol/L (ref 18–29)
Calcium: 9.4 mg/dL (ref 8.6–10.2)
Creatinine, Ser: 1.28 mg/dL — ABNORMAL HIGH (ref 0.76–1.27)
GFR, EST AFRICAN AMERICAN: 62 mL/min/{1.73_m2} (ref >59)
GFR, EST NON AFRICAN AMERICAN: 54 mL/min/{1.73_m2} — AB (ref >59)
GLOBULIN, TOTAL: 2.2 g/dL (ref 1.5–4.5)
Glucose: 95 mg/dL (ref 65–99)
POTASSIUM: 4.1 mmol/L (ref 3.5–5.2)
SODIUM: 142 mmol/L (ref 134–144)
TOTAL PROTEIN: 6.3 g/dL (ref 6.0–8.5)

## 2016-09-24 LAB — TSH: TSH: 1.49 u[IU]/mL (ref 0.450–4.500)

## 2016-09-25 ENCOUNTER — Ambulatory Visit: Payer: Medicare HMO | Admitting: Family Medicine

## 2016-09-30 ENCOUNTER — Telehealth: Payer: Self-pay

## 2016-10-01 ENCOUNTER — Other Ambulatory Visit: Payer: Self-pay | Admitting: *Deleted

## 2016-10-01 ENCOUNTER — Other Ambulatory Visit: Payer: Self-pay

## 2016-10-01 ENCOUNTER — Other Ambulatory Visit: Payer: Self-pay | Admitting: Family Medicine

## 2016-10-01 DIAGNOSIS — I739 Peripheral vascular disease, unspecified: Secondary | ICD-10-CM

## 2016-10-01 DIAGNOSIS — M79604 Pain in right leg: Secondary | ICD-10-CM

## 2016-10-01 DIAGNOSIS — M79605 Pain in left leg: Principal | ICD-10-CM

## 2016-10-01 NOTE — Telephone Encounter (Signed)
New order placed

## 2016-10-01 NOTE — Telephone Encounter (Signed)
I ordered this in my last note:     VAS [place] LOWER EXTREMITY ARTERIAL DUPLEX; Future  Apparently it is one of the ones that doesn't come through. Thanks, Terry Sloan

## 2016-10-04 ENCOUNTER — Ambulatory Visit (HOSPITAL_COMMUNITY)
Admission: RE | Admit: 2016-10-04 | Discharge: 2016-10-04 | Disposition: A | Discharge status: Home / Self Care | Payer: Commercial Managed Care - HMO | Source: Ambulatory Visit | Attending: Family Medicine | Admitting: Family Medicine

## 2016-10-04 DIAGNOSIS — M79604 Pain in right leg: Secondary | ICD-10-CM | POA: Diagnosis not present

## 2016-10-04 DIAGNOSIS — M79605 Pain in left leg: Secondary | ICD-10-CM | POA: Diagnosis not present

## 2016-10-07 ENCOUNTER — Other Ambulatory Visit: Payer: Self-pay | Admitting: Family Medicine

## 2016-10-07 DIAGNOSIS — M79606 Pain in leg, unspecified: Secondary | ICD-10-CM | POA: Insufficient documentation

## 2016-10-07 DIAGNOSIS — M79605 Pain in left leg: Secondary | ICD-10-CM

## 2016-10-10 ENCOUNTER — Telehealth: Payer: Self-pay | Admitting: Family Medicine

## 2016-10-14 ENCOUNTER — Other Ambulatory Visit: Payer: Self-pay

## 2016-10-14 DIAGNOSIS — M79605 Pain in left leg: Secondary | ICD-10-CM

## 2016-10-14 DIAGNOSIS — M7989 Other specified soft tissue disorders: Principal | ICD-10-CM

## 2016-10-15 ENCOUNTER — Ambulatory Visit (HOSPITAL_COMMUNITY)
Admission: RE | Admit: 2016-10-15 | Discharge: 2016-10-15 | Disposition: A | Discharge status: Home / Self Care | Payer: Commercial Managed Care - HMO | Source: Ambulatory Visit | Attending: Family Medicine | Admitting: Family Medicine

## 2016-10-15 ENCOUNTER — Other Ambulatory Visit: Payer: Self-pay | Admitting: Family Medicine

## 2016-10-15 ENCOUNTER — Ambulatory Visit (INDEPENDENT_AMBULATORY_CARE_PROVIDER_SITE_OTHER): Payer: Medicare HMO | Admitting: Family Medicine

## 2016-10-15 ENCOUNTER — Encounter: Payer: Self-pay | Admitting: Family Medicine

## 2016-10-15 VITALS — BP 118/63 | HR 67 | Temp 96.9°F | Ht 69.0 in | Wt 178.0 lb

## 2016-10-15 DIAGNOSIS — G459 Transient cerebral ischemic attack, unspecified: Secondary | ICD-10-CM

## 2016-10-15 DIAGNOSIS — R4701 Aphasia: Secondary | ICD-10-CM

## 2016-10-15 DIAGNOSIS — R634 Abnormal weight loss: Secondary | ICD-10-CM | POA: Diagnosis not present

## 2016-10-15 DIAGNOSIS — F329 Major depressive disorder, single episode, unspecified: Secondary | ICD-10-CM | POA: Diagnosis not present

## 2016-10-15 DIAGNOSIS — I6782 Cerebral ischemia: Secondary | ICD-10-CM | POA: Diagnosis not present

## 2016-10-15 DIAGNOSIS — E782 Mixed hyperlipidemia: Secondary | ICD-10-CM

## 2016-10-15 DIAGNOSIS — F419 Anxiety disorder, unspecified: Secondary | ICD-10-CM

## 2016-10-15 DIAGNOSIS — R4781 Slurred speech: Secondary | ICD-10-CM | POA: Diagnosis not present

## 2016-10-15 DIAGNOSIS — Z1389 Encounter for screening for other disorder: Secondary | ICD-10-CM | POA: Diagnosis not present

## 2016-10-15 DIAGNOSIS — R55 Syncope and collapse: Secondary | ICD-10-CM | POA: Diagnosis not present

## 2016-10-15 DIAGNOSIS — F32A Depression, unspecified: Secondary | ICD-10-CM

## 2016-10-15 DIAGNOSIS — Z01818 Encounter for other preprocedural examination: Secondary | ICD-10-CM | POA: Diagnosis not present

## 2016-10-15 MED ORDER — ESCITALOPRAM OXALATE 10 MG PO TABS: 10.0000 mg | ORAL_TABLET | Freq: Every day | ORAL | 2 refills | Status: DC

## 2016-10-15 MED ORDER — CLONAZEPAM 0.5 MG PO TABS
0.5000 mg | ORAL_TABLET | Freq: Two times a day (BID) | ORAL | 0 refills | Status: DC | PRN
Start: 2016-10-15 — End: 2016-11-01

## 2016-10-15 MED ORDER — PRAVASTATIN SODIUM 40 MG PO TABS: ORAL_TABLET | ORAL | 3 refills | Status: DC

## 2016-10-15 NOTE — Progress Notes (Signed)
Subjective:  Patient ID: Terry Sloan, male    DOB: Jan 05, 1941  Age: 76 y.o. MRN: [id number]  CC: Eye Problem (pt here today c/o loss of vision this past Saturday night along with slurring and mumbling, chest pain. Called EMS and they came out to evaluate and advised him to go to the ED for evaluation and pt refused.)   HPI Terry Sloan presents for Symptoms as noted above on set 3 evenings ago. He was stressed and upset because he feels he's lost everything. He quit work several months ago due to business changes. Since that time he's lost all of his equipment due to financial issues. He's lost everything he owns except his house and he is afraid that's coming next. In this setting he developed these symptoms. The symptoms lasted about 10 minutes. He reports that he had a numbness on the right side of the face and total blindness in the right eye. This happened rather suddenly and lasted for about 5 minutes. For the next 5 minutes it slowly cleared. However during that time he noted that he could not talk he had garbled speech. He knew what he was trying to say but he can also tell that his words were coming out completely garbled. He called EMS. During the evaluation the symptoms resolved completely therefore he declined transport. Patient was encouraged to go to the emergency department and have an MRI. Today he notes that the symptoms have not returned. He has not had any further issues with chest pain. There has been no focal neurologic change or blindness.   History Terry Sloan has a past medical history of Hyperlipidemia and PVD (peripheral vascular disease) ([place]).   He has no past surgical history on file.   His family history is not on file.He reports that he has quit smoking. He has never used smokeless tobacco. He reports that he does not drink alcohol or use drugs.    ROS Review of Systems  Constitutional: Positive for unexpected weight change ( Now at 36 pounds.). Negative for chills,  diaphoresis and fever.  HENT: Negative for congestion, hearing loss, rhinorrhea and sore throat.   Eyes: Positive for visual disturbance.  Respiratory: Negative for cough and shortness of breath.   Cardiovascular: Negative for chest pain.  Gastrointestinal: Negative for abdominal pain, constipation and diarrhea.  Genitourinary: Negative for dysuria and flank pain.  Musculoskeletal: Negative for arthralgias and joint swelling.  Skin: Negative for rash.  Neurological: Positive for facial asymmetry, speech difficulty and numbness. Negative for dizziness and headaches.  Psychiatric/Behavioral: Negative for dysphoric mood and sleep disturbance.    Objective:  BP 118/63   Pulse 67   Temp (!) 96.9 F (36.1 C) (Oral)   Ht 5' 9"  (1.753 m)   Wt 178 lb (80.7 kg)   BMI 26.29 kg/m   BP Readings from Last 3 Encounters:  10/15/16 118/63  09/23/16 (!) 103/59  07/31/16 130/69    Wt Readings from Last 3 Encounters:  10/15/16 178 lb (80.7 kg)  09/23/16 184 lb (83.5 kg)  07/31/16 205 lb (93 kg)     Physical Exam  Constitutional: He is oriented to person, place, and time. He appears well-developed and well-nourished. No distress.  HENT:  Head: Normocephalic and atraumatic.  Right Ear: External ear normal.  Left Ear: External ear normal.  Nose: Nose normal.  Mouth/Throat: Oropharynx is clear and moist.  Eyes: Conjunctivae and EOM are normal. Pupils are equal, round, and reactive to light.  Neck: Normal  range of motion. Neck supple. No thyromegaly present.  Cardiovascular: Normal rate, regular rhythm and normal heart sounds.   No murmur heard. Pulmonary/Chest: Effort normal and breath sounds normal. No respiratory distress. He has no wheezes. He has no rales.  Abdominal: Soft. Bowel sounds are normal. He exhibits no distension. There is no tenderness.  Lymphadenopathy:    He has no cervical adenopathy.  Neurological: He is alert and oriented to person, place, and time. He has normal  reflexes.  Skin: Skin is warm and dry.  Psychiatric: He has a normal mood and affect. His behavior is normal. Judgment and thought content normal.      Assessment & Plan:   Aslan was seen today for eye problem.  Diagnoses and all orders for this visit:  Transient cerebral ischemia, unspecified type -     CMP14+EGFR -     CBC with Differential/Platelet -     Cancel: MR BRAIN W WO CONTRAST; Future -     MR BRAIN WO CONTRAST; Future  Aphasia -     CMP14+EGFR -     Cancel: MR BRAIN W WO CONTRAST; Future -     MR BRAIN WO CONTRAST; Future  Weight loss, unintentional -     CMP14+EGFR -     CBC with Differential/Platelet -     Cancel: MR BRAIN W WO CONTRAST; Future -     MR BRAIN WO CONTRAST; Future  Mixed hyperlipidemia -     CMP14+EGFR -     CBC with Differential/Platelet -     Lipid panel -     pravastatin (PRAVACHOL) 40 MG tablet; TAKE ONE (1) TABLET EACH DAY  Anxiety and depression -     CBC with Differential/Platelet -     escitalopram (LEXAPRO) 10 MG tablet; Take 1 tablet (10 mg total) by mouth daily. -     clonazePAM (KLONOPIN) 0.5 MG tablet; Take 1 tablet (0.5 mg total) by mouth 2 (two) times daily as needed for anxiety.       I have discontinued Mr. Eschmann's fluocinonide-emollient, mupirocin ointment, and DULoxetine. I am also having him start on escitalopram and clonazePAM. Additionally, I am having him maintain his pravastatin.  Allergies as of 10/15/2016      Reactions   Celebrex [celecoxib]       Medication List       Accurate as of 10/15/16  6:05 PM. Always use your most recent med list.          clonazePAM 0.5 MG tablet Commonly known as:  KLONOPIN Take 1 tablet (0.5 mg total) by mouth 2 (two) times daily as needed for anxiety.   escitalopram 10 MG tablet Commonly known as:  LEXAPRO Take 1 tablet (10 mg total) by mouth daily.   pravastatin 40 MG tablet Commonly known as:  PRAVACHOL TAKE ONE (1) TABLET EACH DAY        Follow-up: Return  in about 7 days (around 10/22/2016) for Depression, TIA.  Claretta Fraise, M.D.

## 2016-10-15 NOTE — Telephone Encounter (Signed)
Pt aware to be expecting a phone call from [facility]

## 2016-10-16 ENCOUNTER — Other Ambulatory Visit: Payer: Self-pay | Admitting: Family Medicine

## 2016-10-16 DIAGNOSIS — Z8673 Personal history of transient ischemic attack (TIA), and cerebral infarction without residual deficits: Secondary | ICD-10-CM | POA: Insufficient documentation

## 2016-10-16 DIAGNOSIS — F419 Anxiety disorder, unspecified: Secondary | ICD-10-CM

## 2016-10-16 DIAGNOSIS — R4701 Aphasia: Secondary | ICD-10-CM | POA: Insufficient documentation

## 2016-10-16 DIAGNOSIS — R634 Abnormal weight loss: Secondary | ICD-10-CM

## 2016-10-16 DIAGNOSIS — G459 Transient cerebral ischemic attack, unspecified: Secondary | ICD-10-CM

## 2016-10-16 DIAGNOSIS — F32A Depression, unspecified: Secondary | ICD-10-CM | POA: Insufficient documentation

## 2016-10-16 DIAGNOSIS — F329 Major depressive disorder, single episode, unspecified: Secondary | ICD-10-CM | POA: Insufficient documentation

## 2016-10-16 LAB — CBC WITH DIFFERENTIAL/PLATELET
BASOS: 1 %
Basophils Absolute: 0 10*3/uL (ref 0.0–0.2)
EOS (ABSOLUTE): 0.1 10*3/uL (ref 0.0–0.4)
EOS: 2 %
HEMATOCRIT: 42.7 % (ref 37.5–51.0)
HEMOGLOBIN: 14 g/dL (ref 13.0–17.7)
IMMATURE GRANS (ABS): 0 10*3/uL (ref 0.0–0.1)
Immature Granulocytes: 0 %
LYMPHS: 22 %
Lymphocytes Absolute: 1 10*3/uL (ref 0.7–3.1)
MCH: 32 pg (ref 26.6–33.0)
MCHC: 32.8 g/dL (ref 31.5–35.7)
MCV: 98 fL — AB (ref 79–97)
MONOCYTES: 13 %
Monocytes Absolute: 0.6 10*3/uL (ref 0.1–0.9)
NEUTROS ABS: 2.8 10*3/uL (ref 1.4–7.0)
Neutrophils: 62 %
PLATELETS: 162 10*3/uL (ref 150–379)
RBC: 4.38 x10E6/uL (ref 4.14–5.80)
RDW: 14.1 % (ref 12.3–15.4)
WBC: 4.5 10*3/uL (ref 3.4–10.8)

## 2016-10-16 LAB — CMP14+EGFR
A/G RATIO: 2 (ref 1.2–2.2)
ALT: 16 IU/L (ref 0–44)
AST: 17 IU/L (ref 0–40)
Albumin: 3.8 g/dL (ref 3.5–4.8)
Alkaline Phosphatase: 72 IU/L (ref 39–117)
BUN/Creatinine Ratio: 11 (ref 10–24)
BUN: 13 mg/dL (ref 8–27)
Bilirubin Total: 0.6 mg/dL (ref 0.0–1.2)
CALCIUM: 8.9 mg/dL (ref 8.6–10.2)
CO2: 25 mmol/L (ref 18–29)
Chloride: 104 mmol/L (ref 96–106)
Creatinine, Ser: 1.21 mg/dL (ref 0.76–1.27)
GFR, EST AFRICAN AMERICAN: 66 mL/min/{1.73_m2} (ref >59)
GFR, EST NON AFRICAN AMERICAN: 57 mL/min/{1.73_m2} — AB (ref >59)
Globulin, Total: 1.9 g/dL (ref 1.5–4.5)
Glucose: 86 mg/dL (ref 65–99)
POTASSIUM: 3.9 mmol/L (ref 3.5–5.2)
Sodium: 144 mmol/L (ref 134–144)
TOTAL PROTEIN: 5.7 g/dL — AB (ref 6.0–8.5)

## 2016-10-16 LAB — LIPID PANEL
CHOLESTEROL TOTAL: 157 mg/dL (ref 100–199)
Chol/HDL Ratio: 4.2 ratio (ref 0.0–5.0)
HDL: 37 mg/dL — AB (ref >39)
LDL Calculated: 107 mg/dL — ABNORMAL HIGH (ref 0–99)
TRIGLYCERIDES: 67 mg/dL (ref 0–149)
VLDL CHOLESTEROL CAL: 13 mg/dL (ref 5–40)

## 2016-10-25 DIAGNOSIS — H25813 Combined forms of age-related cataract, bilateral: Secondary | ICD-10-CM | POA: Diagnosis not present

## 2016-10-25 DIAGNOSIS — H25812 Combined forms of age-related cataract, left eye: Secondary | ICD-10-CM | POA: Diagnosis not present

## 2016-10-25 DIAGNOSIS — H25811 Combined forms of age-related cataract, right eye: Secondary | ICD-10-CM | POA: Diagnosis not present

## 2016-10-25 DIAGNOSIS — H53123 Transient visual loss, bilateral: Secondary | ICD-10-CM | POA: Diagnosis not present

## 2016-10-25 DIAGNOSIS — H3530 Unspecified macular degeneration: Secondary | ICD-10-CM | POA: Diagnosis not present

## 2016-10-25 DIAGNOSIS — H353131 Nonexudative age-related macular degeneration, bilateral, early dry stage: Secondary | ICD-10-CM | POA: Diagnosis not present

## 2016-10-25 DIAGNOSIS — H02831 Dermatochalasis of right upper eyelid: Secondary | ICD-10-CM | POA: Diagnosis not present

## 2016-10-29 ENCOUNTER — Other Ambulatory Visit (HOSPITAL_COMMUNITY): Payer: Self-pay | Admitting: Ophthalmology

## 2016-10-29 DIAGNOSIS — H53123 Transient visual loss, bilateral: Secondary | ICD-10-CM

## 2016-10-30 ENCOUNTER — Other Ambulatory Visit (HOSPITAL_COMMUNITY): Payer: Self-pay | Admitting: Ophthalmology

## 2016-10-30 DIAGNOSIS — H539 Unspecified visual disturbance: Secondary | ICD-10-CM

## 2016-10-31 ENCOUNTER — Ambulatory Visit (HOSPITAL_COMMUNITY)
Admission: RE | Admit: 2016-10-31 | Discharge: 2016-10-31 | Disposition: A | Discharge status: Home / Self Care | Payer: Commercial Managed Care - HMO | Source: Ambulatory Visit | Attending: Ophthalmology | Admitting: Ophthalmology

## 2016-10-31 DIAGNOSIS — H53123 Transient visual loss, bilateral: Secondary | ICD-10-CM

## 2016-10-31 DIAGNOSIS — I517 Cardiomegaly: Secondary | ICD-10-CM | POA: Diagnosis not present

## 2016-10-31 DIAGNOSIS — I77810 Thoracic aortic ectasia: Secondary | ICD-10-CM | POA: Diagnosis not present

## 2016-10-31 DIAGNOSIS — E785 Hyperlipidemia, unspecified: Secondary | ICD-10-CM | POA: Diagnosis not present

## 2016-10-31 DIAGNOSIS — I209 Angina pectoris, unspecified: Secondary | ICD-10-CM | POA: Insufficient documentation

## 2016-10-31 DIAGNOSIS — H539 Unspecified visual disturbance: Secondary | ICD-10-CM | POA: Diagnosis not present

## 2016-10-31 DIAGNOSIS — Z87891 Personal history of nicotine dependence: Secondary | ICD-10-CM | POA: Insufficient documentation

## 2016-10-31 DIAGNOSIS — H547 Unspecified visual loss: Secondary | ICD-10-CM | POA: Diagnosis not present

## 2016-10-31 DIAGNOSIS — I6523 Occlusion and stenosis of bilateral carotid arteries: Secondary | ICD-10-CM | POA: Insufficient documentation

## 2016-10-31 DIAGNOSIS — I351 Nonrheumatic aortic (valve) insufficiency: Secondary | ICD-10-CM | POA: Diagnosis not present

## 2016-10-31 DIAGNOSIS — H53129 Transient visual loss, unspecified eye: Secondary | ICD-10-CM | POA: Insufficient documentation

## 2016-10-31 DIAGNOSIS — R4701 Aphasia: Secondary | ICD-10-CM | POA: Diagnosis not present

## 2016-10-31 NOTE — Progress Notes (Signed)
*  PRELIMINARY RESULTS* Echocardiogram 2D Echocardiogram has been performed.  Jeryl Columbialliott, Annise Boran 10/31/2016, 11:50 AM

## 2016-11-01 ENCOUNTER — Encounter: Payer: Self-pay | Admitting: Neurology

## 2016-11-01 ENCOUNTER — Ambulatory Visit (INDEPENDENT_AMBULATORY_CARE_PROVIDER_SITE_OTHER): Payer: Medicare HMO | Admitting: Neurology

## 2016-11-01 VITALS — BP 109/70 | HR 73 | Ht 69.0 in | Wt 176.8 lb

## 2016-11-01 DIAGNOSIS — G458 Other transient cerebral ischemic attacks and related syndromes: Secondary | ICD-10-CM

## 2016-11-01 DIAGNOSIS — M25562 Pain in left knee: Secondary | ICD-10-CM

## 2016-11-01 DIAGNOSIS — G8929 Other chronic pain: Secondary | ICD-10-CM

## 2016-11-01 NOTE — Patient Instructions (Addendum)

## 2016-11-01 NOTE — Progress Notes (Signed)
[facility]    Provider:  Dr Lucia Gaskins Referring Provider: Mechele Claude, MD Primary Care Physician:  Elenora Gamma, MD  CC:  TIA  HPI:  Terry Sloan is a 76 y.o. male here as a referral from Dr. Darlyn Read for TIA. Past medical history hyperlipidemia, anxiety and depression, peripheral vascular disease, emphysema, diabetes. Here with daughter and they do not want to talk about the TIA the daughter and patient they have had the entire workup. They thought he was her for an emg/ncs. In 2005 broke his ankle. They put a pin into the knee. He has fallen several times. The knee hurts on both sides of the knee more on the medial. It hurts right where the pins are in place. And the pain can be severe. He has left leg weakness, feels like it is going to collapse. He has low back pain. The left leg pain started about a year and a half ago. He started falling then. More pain in the knee. He has weakness lifting the left leg up. Worsening over the last year. Lost 36 pounds since April and it has worsened. No radiular symptoms from the back and the bac only started hurting after a recent fall. No sensory changes. Daughter provides much information as well today.  Reviewed notes, labs and imaging from outside physicians, which showed:  Personally reviewed MRI images which showed no acute etiology, chronic small vessel ischemic disease.  Review primary care notes. Patient was seen earlier last month complaining of acute loss of vision along with slurring and mumbling and chest pain. EMS was called and patient refused to go to the emergency room. Patient is under stress, recent financial issues, worried about losing his home. At previous appointment he reported that he had numbness on the right side of the face and total blindness in the right eye rather suddenly and lasted for about 5 minutes. In the next 5 minutes slowly. He could not talk during that time, his speech was garbled, he knew he was  trying to say but the words were coming out garbled. Symptoms resolved when EMS was there. Patient was encouraged to go to the emergency department and have an MRI. Patient declined. Since then no further issues of focal neurologic deficits. Labs, MRI of the brain ordered. He is on pravastatin for hyperlipidemia. He is on Lexapro for anxiety and depression.  Review of Systems: Patient complains of symptoms per HPI as well as the following symptoms: knee pain. Pertinent negatives and positives per HPI. All others negative.   Social History   Social History  . Marital status: Married    Spouse name: N/A  . Number of children: 9  . Years of education: N/A   Occupational History  . Retired    Social History Main Topics  . Smoking status: Former Games developer  . Smokeless tobacco: Never Used     Comment: Quit 20+ year ago  . Alcohol use Yes     Comment: Occasional beer  . Drug use: No  . Sexual activity: Not on file   Other Topics Concern  . Not on file   Social History Narrative   Lives at home w/ his significant other   Right-handed   Caffeine: some coffee, 3 diet Mt Dew's per day    Family History  Problem Relation Age of Onset  . Stroke Neg Hx     Past Medical History:  Diagnosis Date  . Hyperlipidemia   . Macular degeneration   .  PVD (peripheral vascular disease) (HCC)     Past Surgical History:  Procedure Laterality Date  . KNEE SURGERY Left   . NECK SURGERY     Tumor reomoved from head/neck in the 60s    Current Outpatient Prescriptions  Medication Sig Dispense Refill  . aspirin EC 81 MG tablet Take 81 mg by mouth daily.     No current facility-administered medications for this visit.     Allergies as of 11/01/2016 - Review Complete 11/01/2016  Allergen Reaction Noted  . Celebrex [celecoxib]  03/25/2014    Vitals: BP 109/70   Pulse 73   Ht 5\' 9"  (1.753 m)   Wt 176 lb 12.8 oz (80.2 kg)   BMI 26.11 kg/m  Last Weight:  Wt Readings from Last 1  Encounters:  11/01/16 176 lb 12.8 oz (80.2 kg)   Last Height:   Ht Readings from Last 1 Encounters:  11/01/16 5\' 9"  (1.753 m)    Physical exam: Exam: Gen: NAD, conversant, well nourised, well groomed                     CV: RRR, no MRG. No Carotid Bruits. No peripheral edema, warm, nontender Eyes: Conjunctivae clear without exudates or hemorrhage MSK: pain on palpation of the left knee specifically point tenderness bilaterally at the sites of hardware.   Neuro: Detailed Neurologic Exam  Speech:    Speech is normal; fluent and spontaneous with normal comprehension.  Cognition:    The patient is oriented to person, place, and time;     recent and remote memory intact;     language fluent;     normal attention, concentration,     fund of knowledge Cranial Nerves:    The pupils are equal, round, and reactive to light. Attempted fundoscopic exam could not visualize due to small pupils. Visual fields are full to finger confrontation. Extraocular movements are intact. Trigeminal sensation is intact and the muscles of mastication are normal. The face is symmetric. The palate elevates in the midline. Hearing intact. Voice is normal. Shoulder shrug is normal. The tongue has normal motion without fasciculations.   Coordination:    Normal finger to nose and heel to shin. Normal rapid alternating movements.   Gait:    Antalgic due to right leg pain  Motor Observation:    No asymmetry, no atrophy, and no involuntary movements noted. Tone:    Normal muscle tone.    Posture:    Posture is normal. normal erect    Strength:    Strength is V/V in the upper and lower limbs.      Sensation: intact to LT     Reflex Exam:  DTR's:    Deep tendon reflexes in the upper and lower extremities are symmetrical bilaterally.   Toes:    The toes are equivocal bilaterally.   Clonus:    Clonus is absent.      Assessment/Plan:  76 year old patient who was referred for TIA and knee pain.    TIA: I had a long d/w patient about his recent TIA, risk for recurrent stroke/TIAs, personally independently reviewed imaging studies and stroke evaluation results and answered questions.Continue ASA 325mg  for secondary stroke prevention and maintain strict control of hypertension with blood pressure goal below 130/90, diabetes with hemoglobin A1c goal below 6.5% and lipids with LDL cholesterol goal below 70 mg/dL. I also advised the patient to eat a healthy diet with plenty of whole grains, cereals, fruits and vegetables,  exercise regularly and maintain ideal body weight .Followup in the future with me in 2 months or call earlier if necessary.  Knee pain:  pain on palpation of the left knee specifically point tenderness bilaterally at the sites of hardware. I don't think an emg/ncs would yield any more helpful information but discussed with patient and daughter and they prefer to go see an orthopaedist which I think is a good idea as pain appears to be more musculoskeletal as opposed to monneuropathy or polyneuropathy.  Naomie Dean, MD  [facility] [address] [place], [place] [address]  Phone [phone] Fax [phone]

## 2016-11-03 ENCOUNTER — Encounter: Payer: Self-pay | Admitting: Neurology

## 2016-11-03 DIAGNOSIS — M25562 Pain in left knee: Secondary | ICD-10-CM | POA: Insufficient documentation

## 2016-11-04 ENCOUNTER — Ambulatory Visit (INDEPENDENT_AMBULATORY_CARE_PROVIDER_SITE_OTHER): Payer: Medicare HMO | Admitting: Family Medicine

## 2016-11-04 ENCOUNTER — Encounter: Payer: Self-pay | Admitting: Family Medicine

## 2016-11-04 VITALS — BP 114/67 | HR 70 | Temp 97.4°F | Ht 69.0 in | Wt 180.0 lb

## 2016-11-04 DIAGNOSIS — R634 Abnormal weight loss: Secondary | ICD-10-CM

## 2016-11-04 DIAGNOSIS — M25562 Pain in left knee: Secondary | ICD-10-CM

## 2016-11-04 DIAGNOSIS — G8929 Other chronic pain: Secondary | ICD-10-CM

## 2016-11-04 DIAGNOSIS — G459 Transient cerebral ischemic attack, unspecified: Secondary | ICD-10-CM | POA: Diagnosis not present

## 2016-11-04 MED ORDER — PRAVASTATIN SODIUM 40 MG PO TABS: 40.0000 mg | ORAL_TABLET | Freq: Every day | ORAL | 3 refills | Status: DC

## 2016-11-04 NOTE — Patient Instructions (Addendum)
Great to meet you!  We will fit him for a holter monitor  Continue aspirin Start pravastatin again  Come back in 3-4 weeks

## 2016-11-04 NOTE — Progress Notes (Signed)
   HPI  Patient presents today to follow-up for TIA and recent weight loss.  Patient has had unexplained weight loss over last 3 months. When a lengthy discussion about, and reasons for this. He's had prostate followed by urology within the last year. Patient has never had a colonoscopy, denies hematochezia. Patient states that he has been under a lot of stress over the last 3-4 months. He and his daughter agree that he's had financial stress and stress with girlfriends. He states that his eating had declined severely and now is improving, they point out that he has gained a small amount of weight back since eating better. They do not want to pursue further workup at this time for weight loss.  Patient explains that he's had 2 episodes of loss of vision and speech. This has occurred over 3 weeks ago. He's been evaluated by neurology who felt that he had a TIA. MRI ruled out stroke. He's also had an echo and carotid Doppler studies since that time. He denies palpitations, chest pain. He states that he feels very good currently. He's taking daily aspirin, 325 mg He stopped taking pravastatin due to inconvenience, no myalgias.  Patient having frequent nightmares, does not want to try anxiety medicine for now. He did seem to tolerate Lexapro.  Left knee pain With previous surgery and left knee, patient states that he has hardware in place, at times he has extreme tenderness to palpation of the hardware. No recent injury. It is making it difficult to walk, he is hoping to have surgery.  PMH: Smoking status noted ROS: Per HPI  Objective: BP 114/67   Pulse 70   Temp 97.4 F (36.3 C) (Oral)   Ht 5\' 9"  (1.753 m)   Wt 180 lb (81.6 kg)   BMI 26.58 kg/m  Gen: NAD, alert, cooperative with exam HEENT: NCAT CV: RRR, good S1/S2, no murmur Resp: CTABL, no wheezes, non-labored Ext: No edema, warm Neuro: Alert and oriented, No gross deficits Skin: well healed scar on L knee, 2 others proximal to  L knee he states is from a chainsaw.   Assessment and plan:  # Unexplained weight loss Patient gaining back slowly, offered further workup which he declines today. Would recommend FOBT and considering GI referral for colonoscopy 3-4 week follow-up, repeat weight at that time  # TIA No additional symptoms for the last 3 weeks Discussed red flags for stroke and reasons to seek emergency medical care Continue full dose aspirin daily, start statin. EKG today without any signs of atrial fibrillation, Holter monitor placed  # Left knee pain With previous knee surgery Discussed risks of general anesthesia, patient is hoping for surgery. Patient has follow-up orthopedics surgery this week. Pain is improved currently but has been exacerbations.   Orders Placed This Encounter  Procedures  . Holter monitor - 24 hour    Standing Status:   Future    Standing Expiration Date:   11/05/2026  . EKG 12-Lead    Meds ordered this encounter  Medications  . aspirin 325 MG tablet    Sig: Take 325 mg by mouth daily.    Murtis SinkSam Wynnie Pacetti, MD [facility] 11/04/2016, 4:37 PM

## 2016-11-07 DIAGNOSIS — G459 Transient cerebral ischemic attack, unspecified: Secondary | ICD-10-CM | POA: Diagnosis not present

## 2016-11-08 DIAGNOSIS — M1712 Unilateral primary osteoarthritis, left knee: Secondary | ICD-10-CM | POA: Diagnosis not present

## 2016-11-15 ENCOUNTER — Other Ambulatory Visit: Payer: Self-pay

## 2016-11-15 DIAGNOSIS — I498 Other specified cardiac arrhythmias: Secondary | ICD-10-CM

## 2016-11-15 DIAGNOSIS — I499 Cardiac arrhythmia, unspecified: Principal | ICD-10-CM

## 2016-11-15 NOTE — Progress Notes (Signed)
He is ok to have IV sedation for eye surgery.   Murtis SinkSam Bradshaw, MD [facility] 11/15/2016, 11:59 AM

## 2016-11-15 NOTE — Progress Notes (Signed)
Pt notified okay for sedation

## 2016-11-15 NOTE — Progress Notes (Signed)
Daughter Rodney Boozeasha aware of heart monitor results. Referral placed to cardiology. Daughter wants to know if patient is still released to have his cataracts surgery Thursday due to his heart monitor results. Please advise.

## 2016-11-18 ENCOUNTER — Telehealth: Payer: Self-pay | Admitting: Family Medicine

## 2016-11-18 NOTE — Telephone Encounter (Signed)
spoke with pt's daughter regarding upcoming cataract surgery Per Dr Ermalinda MemosBradshaw pt is ok to have cataract surgery

## 2016-11-21 DIAGNOSIS — H2511 Age-related nuclear cataract, right eye: Secondary | ICD-10-CM | POA: Diagnosis not present

## 2016-11-21 DIAGNOSIS — H25811 Combined forms of age-related cataract, right eye: Secondary | ICD-10-CM | POA: Diagnosis not present

## 2016-11-28 DIAGNOSIS — H04123 Dry eye syndrome of bilateral lacrimal glands: Secondary | ICD-10-CM | POA: Diagnosis not present

## 2016-11-29 ENCOUNTER — Ambulatory Visit (INDEPENDENT_AMBULATORY_CARE_PROVIDER_SITE_OTHER): Payer: Medicare HMO | Admitting: Family Medicine

## 2016-11-29 ENCOUNTER — Encounter: Payer: Self-pay | Admitting: Family Medicine

## 2016-11-29 VITALS — BP 114/63 | HR 58 | Temp 97.1°F | Ht 69.0 in | Wt 185.0 lb

## 2016-11-29 DIAGNOSIS — M25562 Pain in left knee: Secondary | ICD-10-CM

## 2016-11-29 DIAGNOSIS — G459 Transient cerebral ischemic attack, unspecified: Secondary | ICD-10-CM | POA: Diagnosis not present

## 2016-11-29 DIAGNOSIS — E782 Mixed hyperlipidemia: Secondary | ICD-10-CM

## 2016-11-29 NOTE — Patient Instructions (Signed)
Great to see you!  We will work on a referral to orthopedic surgery at [facility] in [place].   I will ask neurology what their thought are on surgery after TIA.   Lets see you again in 3 month sunless you need us sooner.

## 2016-11-29 NOTE — Progress Notes (Signed)
   HPI  Patient presents today here for follow-up hyperlipidemia and knee pain.  Hyperlipidemia Patient started on pravastatin last visit, he is tolerating well without myalgias. Good medication compliance.  Previously had TIA, has had no additional symptoms. He would really like to have left knee surgery with seriously pain.  Knee pain Long-standing left knee pain, patient has hardware present that his previous surgeon states needs to be removed. His previous surgeon has stopped doing surgery and is retiring. He would like referral to another. Patient states that he has left knee pain, at times it's very severe and at times it gives out on him causing falls. His last fall Sunday  PMH: Smoking status noted ROS: Per HPI  Objective: BP 114/63   Pulse (!) 58   Temp 97.1 F (36.2 C) (Oral)   Ht '5\' 9"'$  (1.753 m)   Wt 185 lb (83.9 kg)   BMI 27.32 kg/m  Gen: NAD, alert, cooperative with exam HEENT: NCAT CV: RRR, good S1/S2, no murmur Resp: CTABL, no wheezes, non-labored Ext: No edema, warm Neuro: Alert and oriented, No gross deficits  Assessment and plan:  # Hyperlipidemia Repeat labs Tolerating statin well, continue LDL goal of 70 or less  # TIA No repeat symptoms Will ask neurology to consider appropriate interval before general anesthesia after TIA. Continue statin and aspirin  # Left knee pain Patient requesting injection, however I generally try to avoid injections with knees with any sort of hardware. Refer to orthopedics for their opinion, he may benefit from visco-supplementation. May be a surgical candidate however I would recommend discussion with cards and neuro.     Orders Placed This Encounter  Procedures  . CMP14+EGFR  . LDL Cholesterol, Direct  . Ambulatory referral to Orthopedic Surgery    Referral Priority:   Routine    Referral Type:   Surgical    Referral Reason:   Specialty Services Required    Requested Specialty:   Orthopedic Surgery   Number of Visits Requested:   1    Meds ordered this encounter  Medications  . ketorolac (ACULAR) 0.5 % ophthalmic solution  . DUREZOL 0.05 % EMUL  . BESIVANCE 0.6 % SUSP    Laroy Apple, MD [facility] 11/29/2016, 5:19 PM

## 2016-11-30 LAB — CMP14+EGFR
A/G RATIO: 2 (ref 1.2–2.2)
ALT: 10 IU/L (ref 0–44)
AST: 18 IU/L (ref 0–40)
Albumin: 4.1 g/dL (ref 3.5–4.8)
Alkaline Phosphatase: 78 IU/L (ref 39–117)
BUN/Creatinine Ratio: 16 (ref 10–24)
BUN: 17 mg/dL (ref 8–27)
Bilirubin Total: 0.5 mg/dL (ref 0.0–1.2)
CALCIUM: 8.9 mg/dL (ref 8.6–10.2)
CO2: 23 mmol/L (ref 20–29)
Chloride: 107 mmol/L — ABNORMAL HIGH (ref 96–106)
Creatinine, Ser: 1.09 mg/dL (ref 0.76–1.27)
GFR calc Af Amer: 75 mL/min/{1.73_m2} (ref >59)
GFR calc non Af Amer: 65 mL/min/{1.73_m2} (ref >59)
Globulin, Total: 2.1 g/dL (ref 1.5–4.5)
Glucose: 91 mg/dL (ref 65–99)
POTASSIUM: 4.2 mmol/L (ref 3.5–5.2)
Sodium: 144 mmol/L (ref 134–144)
TOTAL PROTEIN: 6.2 g/dL (ref 6.0–8.5)

## 2016-11-30 LAB — LDL CHOLESTEROL, DIRECT: LDL Direct: 112 mg/dL — ABNORMAL HIGH (ref 0–99)

## 2016-12-05 ENCOUNTER — Telehealth: Payer: Self-pay | Admitting: Family Medicine

## 2016-12-05 NOTE — Telephone Encounter (Signed)
FYI

## 2016-12-16 ENCOUNTER — Ambulatory Visit (INDEPENDENT_AMBULATORY_CARE_PROVIDER_SITE_OTHER): Payer: Medicare HMO | Admitting: Cardiovascular Disease

## 2016-12-16 ENCOUNTER — Encounter: Payer: Self-pay | Admitting: Cardiovascular Disease

## 2016-12-16 VITALS — BP 128/70 | HR 65 | Ht 71.0 in | Wt 185.0 lb

## 2016-12-16 DIAGNOSIS — Z01818 Encounter for other preprocedural examination: Secondary | ICD-10-CM | POA: Diagnosis not present

## 2016-12-16 DIAGNOSIS — I351 Nonrheumatic aortic (valve) insufficiency: Secondary | ICD-10-CM | POA: Diagnosis not present

## 2016-12-16 DIAGNOSIS — I77810 Thoracic aortic ectasia: Secondary | ICD-10-CM | POA: Diagnosis not present

## 2016-12-16 DIAGNOSIS — M25562 Pain in left knee: Secondary | ICD-10-CM | POA: Diagnosis not present

## 2016-12-16 DIAGNOSIS — G8929 Other chronic pain: Secondary | ICD-10-CM

## 2016-12-16 NOTE — Patient Instructions (Signed)
Medication Instructions:  Continue all current medications.  Labwork: none  Testing/Procedures: none  Follow-Up: As needed.    Any Other Special Instructions Will Be Listed Below (If Applicable).  If you need a refill on your cardiac medications before your next appointment, please call your pharmacy.  

## 2016-12-16 NOTE — Progress Notes (Signed)
CARDIOLOGY CONSULT NOTE  Patient ID: Terry Sloan MRN: [id number] DOB/AGE: [birth year] 76 y.o.  Admit date: (Not on file) Primary Physician: Terry Gamma, MD Referring Physician: Ermalinda Sloan  Reason for Consultation: Preoperative risk stratification  HPI: Terry Sloan is a 76 y.o. male who is being seen today for the evaluation of preoperative risk stratification at the request of Terry Gamma, MD.   He has a history of TIA and hyperlipidemia. He has a history of left knee pain and has been referred to orthopedic surgery for their opinion regarding surgery.  He is here with his daughter.  Carotid Dopplers on 10/31/16 demonstrated mild diffuse bilateral carotid artery disease less than 50% bilaterally.  MRI of the brain on 10/15/16 showed moderate chronic small vessel ischemic disease.  Echocardiogram 10/31/16: Normal left ventricular systolic and diastolic function, LVEF 60-65%, moderate LVH, mild to moderate aortic regurgitation, mild aortic root dilatation, 4.2 cm.  He wore a cardiac monitor which showed no evidence of atrial fibrillation with some trigeminy and bigeminy seen.  I personally reviewed the ECG performed on 11/19/16 which demonstrated normal sinus rhythm with no significant ischemic abnormalities, nor any arrhythmias.  He has no personal history of MI or CAD. He denies exertional chest pain and shortness of breath. 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 This led to stress, financial worries, and eventually depression and then he sustained a TIA.  He has chronic left knee pain. He has a prior history of left leg fracture.  He had been taking aspirin but stopped taking it when he was frequently taking Aleve for knee pain. Her graft he underwent circumcision under general anesthesia and 2017 without difficulties.  Prior to his TIA, he was climbing a flight of stairs without exertional chest pain or dyspnea.    Allergies    Allergen Reactions  . Celebrex [Celecoxib]     Current Outpatient Prescriptions  Medication Sig Dispense Refill  . naproxen sodium (ANAPROX) 220 MG tablet Take 220 mg by mouth daily.    . pravastatin (PRAVACHOL) 40 MG tablet Take 1 tablet (40 mg total) by mouth daily. 90 tablet 3   No current facility-administered medications for this visit.     Past Medical History:  Diagnosis Date  . Hyperlipidemia   . Macular degeneration   . PVD (peripheral vascular disease) (HCC)     Past Surgical History:  Procedure Laterality Date  . KNEE SURGERY Left   . NECK SURGERY     Tumor reomoved from head/neck in the 50s    Social History   Social History  . Marital status: Married    Spouse name: N/A  . Number of children: 9  . Years of education: N/A   Occupational History  . Retired    Social History Main Topics  . Smoking status: Former Games developer  . Smokeless tobacco: Never Used     Comment: Quit 20+ year ago  . Alcohol use Yes     Comment: Occasional beer  . Drug use: No  . Sexual activity: Not on file   Other Topics Concern  . Not on file   Social History Narrative   Lives at home w/ his significant other   Right-handed   Caffeine: some coffee, 3 diet Mt Dew's per day     No family history of premature CAD in 1st degree relatives.  Current Meds  Medication Sig  . naproxen sodium (ANAPROX) 220 MG tablet  Take 220 mg by mouth daily.      Review of systems complete and found to be negative unless listed above in HPI    Physical exam Blood pressure 128/70, pulse 65, height 5\' 11"  (1.803 m), weight 185 lb (83.9 kg), SpO2 98 %. General: NAD Neck: No JVD, no thyromegaly or thyroid nodule.  Lungs: Clear to auscultation bilaterally with normal respiratory effort. CV: Nondisplaced PMI. Regular rate and rhythm, normal S1/S2, no S3/S4, no murmur.  No peripheral edema.  No carotid bruit.    Abdomen: Soft, nontender, no distention.  Skin: Intact without lesions or  rashes.  Neurologic: Alert and oriented x 3.  Psych: Normal affect. Extremities: No clubbing or cyanosis.  HEENT: Normal.   ECG: Most recent ECG reviewed.   Labs: Lab Results  Component Value Date/Time   K 4.2 11/29/2016 04:42 PM   BUN 17 11/29/2016 04:42 PM   CREATININE 1.09 11/29/2016 04:42 PM   ALT 10 11/29/2016 04:42 PM   TSH 1.490 09/23/2016 11:49 AM   HGB 14.0 10/15/2016 10:48 AM     Lipids: Lab Results  Component Value Date/Time   LDLCALC 107 (H) 10/15/2016 10:48 AM   LDLDIRECT 112 (H) 11/29/2016 04:42 PM   CHOL 157 10/15/2016 10:48 AM   TRIG 67 10/15/2016 10:48 AM   HDL 37 (L) 10/15/2016 10:48 AM        ASSESSMENT AND PLAN:  1. Preoperative risk stratification: He has good exercise tolerance and is only limited by left knee pain. He is able to achieve 4 METS. He has normal cardiac function. I do not feel stress testing is warranted at this time. He can proceed with surgery with an acceptable level of risk.  2. Aortic regurgitation: Mild to moderate in severity. I recommend screening surveillance echocardiography as needed.  3. Aortic root dilatation: This was mild by echocardiography. I would recommend routine surveillance monitoring.  Disposition: Follow up prn.   Signed: Prentice DockerSuresh Koneswaran, M.D., F.A.C.C.  12/16/2016, 2:52 PM

## 2016-12-17 ENCOUNTER — Other Ambulatory Visit: Payer: Self-pay | Admitting: *Deleted

## 2016-12-17 ENCOUNTER — Telehealth: Payer: Self-pay | Admitting: *Deleted

## 2016-12-17 MED ORDER — PRAVASTATIN SODIUM 80 MG PO TABS: 80.0000 mg | ORAL_TABLET | Freq: Every day | ORAL | 3 refills | Status: DC

## 2016-12-17 NOTE — Telephone Encounter (Signed)
2 phone calls today, see other phone call.   Murtis SinkSam Bradshaw, MD [facility] 12/17/2016, 12:54 PM

## 2016-12-17 NOTE — Telephone Encounter (Signed)
Patients daughter states that you had recommended increasing his pravastatin. Patient is on 40mg  and has been on this dose for a while. Do you want to increase?

## 2016-12-17 NOTE — Telephone Encounter (Signed)
-----   Message from Elenora GammaSamuel L Bradshaw, MD sent at 12/16/2016  3:20 PM EDT ----- Will you let him know that the neurologist is ok with him having surgery as long as he realizes he's at small but acceptable risk of stroke while he is off of antiplatelet therapy. He has just seen cardiology also so there should be no problem clearing him for surgery.   Thanks! sam ----- Message ----- From: Anson FretAhern, Antonia B, MD Sent: 12/05/2016  11:23 AM To: Elenora GammaSamuel L Bradshaw, MD  Hi Dr. Ermalinda MemosBradshaw, soryr I was out of the office since last week. I think he would be fine to have the knee surgery anytime as long as he is aware that there is a small but acceptable risk of stroke while he is off of his anti-platelet therapy for the surgery. thanks   ----- Message ----- From: Elenora GammaBradshaw, Samuel L, MD Sent: 11/29/2016   5:20 PM To: Anson FretAntonia B Ahern, MD  Hi D.r Lucia GaskinsAhern,   I was wondering what you thought would be an appropriate interval before any general anesthesia for knee surgery is? I have discussed with him increased risk and given lots of caution but he is still very hopeful to have knee surgery.   Thanks so much,  Sam

## 2016-12-17 NOTE — Addendum Note (Signed)
Addended by: Bearl MulberryUTHERFORD, Jacklyne Baik K on: 12/17/2016 02:57 PM   Modules accepted: Orders

## 2016-12-17 NOTE — Telephone Encounter (Signed)
Patients daughter aware of recommendations. Patient is not on any antiplatelets.

## 2016-12-17 NOTE — Telephone Encounter (Signed)
For pravastatin- for gooal after TIA of LDL less than 70 would recommend increasing to 80 mg once daily if he has been on 40 mg daily.   Would recommend daily aspirin( full dose) for stroke prevention.   Murtis SinkSam Asser Lucena, MD [facility] 12/17/2016, 12:55 PM

## 2016-12-17 NOTE — Telephone Encounter (Signed)
Pt's daughter notified of recommendation Verbalizes understanding 

## 2017-01-02 DIAGNOSIS — H25812 Combined forms of age-related cataract, left eye: Secondary | ICD-10-CM | POA: Diagnosis not present

## 2017-01-02 DIAGNOSIS — H2512 Age-related nuclear cataract, left eye: Secondary | ICD-10-CM | POA: Diagnosis not present

## 2017-01-09 DIAGNOSIS — Z961 Presence of intraocular lens: Secondary | ICD-10-CM | POA: Diagnosis not present

## 2017-01-09 DIAGNOSIS — H25811 Combined forms of age-related cataract, right eye: Secondary | ICD-10-CM | POA: Diagnosis not present

## 2017-01-09 DIAGNOSIS — H04123 Dry eye syndrome of bilateral lacrimal glands: Secondary | ICD-10-CM | POA: Diagnosis not present

## 2017-01-16 ENCOUNTER — Encounter (INDEPENDENT_AMBULATORY_CARE_PROVIDER_SITE_OTHER): Payer: Self-pay | Admitting: Orthopaedic Surgery

## 2017-01-16 ENCOUNTER — Ambulatory Visit (INDEPENDENT_AMBULATORY_CARE_PROVIDER_SITE_OTHER): Payer: Medicare HMO

## 2017-01-16 ENCOUNTER — Ambulatory Visit (INDEPENDENT_AMBULATORY_CARE_PROVIDER_SITE_OTHER): Payer: Medicare HMO | Admitting: Orthopaedic Surgery

## 2017-01-16 VITALS — BP 144/85 | HR 69 | Ht 69.0 in | Wt 188.0 lb

## 2017-01-16 DIAGNOSIS — G8929 Other chronic pain: Secondary | ICD-10-CM

## 2017-01-16 DIAGNOSIS — M25562 Pain in left knee: Secondary | ICD-10-CM

## 2017-01-16 DIAGNOSIS — M1712 Unilateral primary osteoarthritis, left knee: Secondary | ICD-10-CM

## 2017-01-16 NOTE — Progress Notes (Signed)
Office Visit Note   Patient: Terry Sloan           Date of Birth: [birth year]           MRN: [id number] Visit Date: 01/16/2017              Requested by: Elenora Gamma, MD [address] [place], [place] [address] PCP: Elenora Gamma, MD   Assessment & Plan: Visit Diagnoses:  1. Chronic pain of left knee   2. Unilateral primary osteoarthritis, left knee     Plan: We discussed options patient will proceed with left total knee arthroplasty since he has severe degenerative changes and we discussed that the viscous supplement is unlikely to give him relief. Plan will be removal of the Stryker T-2 standard tibial nail and proximal interlock with cemented total knee arthroplasty.  Follow-Up Instructions: No Follow-up on file.   Orders:  Orders Placed This Encounter  Procedures  . XR Knee 1-2 Views Left  . XR Tibia/Fibula Left   No orders of the defined types were placed in this encounter.     Procedures: No procedures performed   Clinical Data: No additional findings.   Subjective: Chief Complaint  Patient presents with  . Left Knee - Pain    HPI 76 year old male seen with left knee pain. States he's been doing weights caused him to fall. Past history of a tibial nail 2004 had a TIA in April. He states he finished that and possibly viscous supplementation or total knee arthroplasty . Pain is progressing last 2-3 years she said cortisone the past. He quit smoking 23 years ago. Knee bothers him at night bothersome with ambulation in the community. He has had multiple cortisone injections anti-inflammatories and they are not effective.  Review of Systems 14 point review of systems positive for a semester back pain in the past cataracts TIA April 2018. Circumcision 2017 kidney stones 2017. He is using Advil and Aleve in the past for his knee.   Objective: Vital Signs: BP (!) 144/85   Pulse 69   Ht 5\' 9"  (1.753 m)   Wt 188 lb (85.3 kg)   BMI 27.76 kg/m   Physical  Exam  Constitutional: He is oriented to person, place, and time. He appears well-developed and well-nourished.  HENT:  Head: Normocephalic and atraumatic.  Eyes: Pupils are equal, round, and reactive to light. EOM are normal.  Neck: No tracheal deviation present. No thyromegaly present.  Cardiovascular: Normal rate.   Pulmonary/Chest: Effort normal. He has no wheezes.  Abdominal: Soft. Bowel sounds are normal.  Musculoskeletal:  Patient has normal hip range of motion. There is a 10 knee flexion contracture of the left knee. Crepitus with range of motion. Healed tibial nail insertion scar Fareston degree deformity medial joint line tenderness. Negative anterior drawer palpable distal pulses. Opposite knee shows no varus deformity and mild crepitus no effusion good ligament stability.  Neurological: He is alert and oriented to person, place, and time.  Skin: Skin is warm and dry. Capillary refill takes less than 2 seconds.  Psychiatric: He has a normal mood and affect. His behavior is normal. Judgment and thought content normal.    Ortho Exam  Specialty Comments:  No specialty comments available.  Imaging: No results found.   PMFS History: Patient Active Problem List   Diagnosis Date Noted  . Knee pain, left 11/03/2016  . Transient cerebral ischemia 10/16/2016  . Aphasia 10/16/2016  . Anxiety and depression 10/16/2016  .  Leg pain, diffuse 10/07/2016  . PVD (peripheral vascular disease) (HCC) 09/23/2016  . Mixed hyperlipidemia 09/23/2016  . Weight loss, unintentional 09/23/2016   Past Medical History:  Diagnosis Date  . Hyperlipidemia   . Macular degeneration   . PVD (peripheral vascular disease) (HCC)     Family History  Problem Relation Age of Onset  . Stroke Neg Hx     Past Surgical History:  Procedure Laterality Date  . KNEE SURGERY Left   . NECK SURGERY     Tumor reomoved from head/neck in the 8260s   Social History   Occupational History  . Retired     Social History Main Topics  . Smoking status: Former Games developermoker  . Smokeless tobacco: Never Used     Comment: Quit 20+ year ago  . Alcohol use Yes     Comment: Occasional beer  . Drug use: No  . Sexual activity: Not on file

## 2017-01-18 DIAGNOSIS — H2513 Age-related nuclear cataract, bilateral: Secondary | ICD-10-CM | POA: Diagnosis not present

## 2017-01-18 DIAGNOSIS — H40033 Anatomical narrow angle, bilateral: Secondary | ICD-10-CM | POA: Diagnosis not present

## 2017-02-03 DIAGNOSIS — M1712 Unilateral primary osteoarthritis, left knee: Secondary | ICD-10-CM | POA: Insufficient documentation

## 2017-02-06 NOTE — Pre-Procedure Instructions (Signed)
TYQUEZ HOLLIBAUGH  02/06/2017      [facility] - [place], [place] - [address] [place] [address] Phone: [phone] Fax: [phone]    Your procedure is scheduled on 02-14-2017 Friday   Report to [facility] Admitting at 8:00 A.M.   Call this number if you have problems the morning of surgery:  [phone]   Remember:  Do not eat food or drink liquids after midnight   Take these medicines the morning of surgery with A SIP OF WATER Pravastatin)Pravachol)   STOP ASPIRIN,ANTIINFLAMATORIES (IBUPROFEN,ALEVE,MOTRIN,ADVIL,GOODY'S POWDERS),HERBAL SUPPLEMENTS,FISH OIL,AND VITAMINS 5-7 DAYS PRIOR TO SURGERY   Do not wear jewelry,  Do not wear lotions, powders, or perfumes, or deoderant.  Do not shave 48 hours prior to surgery.  Men may shave face and neck.   Do not bring valuables to the hospital.   [facility] is not responsible for any belongings or valuables.  Contacts, dentures or bridgework may not be worn into surgery.  Leave your suitcase in the car.  After surgery it may be brought to your room.  For patients admitted to the hospital, discharge time will be determined by your treatment team.  Patients discharged the day of surgery will not be allowed to drive home.   Special Instructions: [place] - Preparing for Surgery  Before surgery, you can play an important role.  Because skin is not sterile, your skin needs to be as free of germs as possible.  You can reduce the number of germs on you skin by washing with CHG (chlorahexidine gluconate) soap before surgery.  CHG is an antiseptic cleaner which kills germs and bonds with the skin to continue killing germs even after washing.  Please DO NOT use if you have an allergy to CHG or antibacterial soaps.  If your skin becomes reddened/irritated stop using the CHG and inform your nurse when you arrive at Short Stay.  Do not shave (including legs and underarms) for at least 48 hours  prior to the first CHG shower.  You may shave your face.  Please follow these instructions carefully:   1.  Shower with CHG Soap the night before surgery and the   morning of Surgery.  2.  If you choose to wash your hair, wash your hair first as usual with your normal shampoo.  3.  After you shampoo, rinse your hair and body thoroughly to remove the  Shampoo.  4.  Use CHG as you would any other liquid soap.  You can apply chg directly  to the skin and wash gently with scrungie or a clean washcloth.  5.  Apply the CHG Soap to your body ONLY FROM THE NECK DOWN.   Do not use on open wounds or open sores.  Avoid contact with your eyes,  ears, mouth and genitals (private parts).  Wash genitals (private parts) with your normal soap.  6.  Wash thoroughly, paying special attention to the area where your surgery will be performed.  7.  Thoroughly rinse your body with warm water from the neck down.  8.  DO NOT shower/wash with your normal soap after using and rinsing o  the CHG Soap.  9.  Pat yourself dry with a clean towel.            10.  Wear clean pajamas.            11.  Place clean sheets on your bed the night  of your first shower and do not sleep with pets.  Day of Surgery  Do not apply any lotions/deodorants the morning of surgery.  Please wear clean clothes to the hospital/surgery center.    Please read over the following fact sheets that you were given. Surgical Site Infection Prevention

## 2017-02-07 ENCOUNTER — Encounter (HOSPITAL_COMMUNITY)
Admission: RE | Admit: 2017-02-07 | Discharge: 2017-02-07 | Disposition: A | Discharge status: Home / Self Care | Payer: Medicare HMO | Source: Ambulatory Visit | Attending: Orthopaedic Surgery | Admitting: Orthopaedic Surgery

## 2017-02-07 ENCOUNTER — Encounter (HOSPITAL_COMMUNITY): Payer: Self-pay

## 2017-02-07 DIAGNOSIS — Z01812 Encounter for preprocedural laboratory examination: Secondary | ICD-10-CM | POA: Insufficient documentation

## 2017-02-07 DIAGNOSIS — M1712 Unilateral primary osteoarthritis, left knee: Secondary | ICD-10-CM | POA: Insufficient documentation

## 2017-02-07 HISTORY — DX: Cerebral infarction, unspecified: I63.9

## 2017-02-07 HISTORY — DX: Gastro-esophageal reflux disease without esophagitis: K21.9

## 2017-02-07 HISTORY — DX: Unspecified osteoarthritis, unspecified site: M19.90

## 2017-02-07 HISTORY — DX: Personal history of urinary calculi: Z87.442

## 2017-02-07 LAB — CBC
HEMATOCRIT: 39.9 % (ref 39.0–52.0)
HEMOGLOBIN: 13.8 g/dL (ref 13.0–17.0)
MCH: 32.7 pg (ref 26.0–34.0)
MCHC: 34.6 g/dL (ref 30.0–36.0)
MCV: 94.5 fL (ref 78.0–100.0)
Platelets: 162 10*3/uL (ref 150–400)
RBC: 4.22 MIL/uL (ref 4.22–5.81)
RDW: 12.4 % (ref 11.5–15.5)
WBC: 3.6 10*3/uL — AB (ref 4.0–10.5)

## 2017-02-07 LAB — SURGICAL PCR SCREEN
MRSA, PCR: NEGATIVE
STAPHYLOCOCCUS AUREUS: NEGATIVE

## 2017-02-07 LAB — BASIC METABOLIC PANEL
ANION GAP: 5 (ref 5–15)
BUN: 14 mg/dL (ref 6–20)
CALCIUM: 9 mg/dL (ref 8.9–10.3)
CO2: 25 mmol/L (ref 22–32)
Chloride: 112 mmol/L — ABNORMAL HIGH (ref 101–111)
Creatinine, Ser: 1.18 mg/dL (ref 0.61–1.24)
GFR calc Af Amer: >60 mL/min (ref >60)
GFR, EST NON AFRICAN AMERICAN: 58 mL/min — AB (ref >60)
GLUCOSE: 104 mg/dL — AB (ref 65–99)
POTASSIUM: 4.2 mmol/L (ref 3.5–5.1)
SODIUM: 142 mmol/L (ref 135–145)

## 2017-02-10 ENCOUNTER — Other Ambulatory Visit (INDEPENDENT_AMBULATORY_CARE_PROVIDER_SITE_OTHER): Payer: Self-pay | Admitting: Orthopaedic Surgery

## 2017-02-13 NOTE — Anesthesia Preprocedure Evaluation (Addendum)
Anesthesia Evaluation  Patient identified by MRN, date of birth, ID band Patient awake    Reviewed: Allergy & Precautions, NPO status , Patient's Chart, lab work & pertinent test results  Airway Mallampati: II  TM Distance: >3 FB Neck ROM: Full    Dental  (+) Dental Advisory Given, Poor Dentition, Missing   Pulmonary former smoker,    Pulmonary exam normal breath sounds clear to auscultation       Cardiovascular + Peripheral Vascular Disease  Normal cardiovascular exam Rhythm:Regular Rate:Normal  Carotid US 10/2016 - <50% b/l ICAS  TTE 10/2016 - Left ventricle: Moderate LVH. Estimated ejection fraction was in the range of 60% to 65%. - Aortic valve: There was mild to moderate regurgitation. - Aorta: The visualized portion of the proximal ascending aorta is  mildly dilated at 4.2 cm.  Holter 2018 - occas. Bigeminy and trigeminy   Neuro/Psych TIAnegative psych ROS   GI/Hepatic Neg liver ROS, GERD  Medicated and Controlled,  Endo/Other  negative endocrine ROS  Renal/GU negative Renal ROS  negative genitourinary   Musculoskeletal  (+) Arthritis ,   Abdominal   Peds  Hematology negative hematology ROS (+)   Anesthesia Other Findings   Reproductive/Obstetrics                           Anesthesia Physical Anesthesia Plan  ASA: III  Anesthesia Plan: Spinal   Post-op Pain Management:  Regional for Post-op pain   Induction:   PONV Risk Score and Plan: 2 and Ondansetron, Treatment may vary due to age or medical condition and Propofol infusion  Airway Management Planned: Natural Airway and Nasal Cannula  Additional Equipment: None  Intra-op Plan:   Post-operative Plan:   Informed Consent: I have reviewed the patients History and Physical, chart, labs and discussed the procedure including the risks, benefits and alternatives for the proposed anesthesia with the patient or authorized  representative who has indicated his/her understanding and acceptance.   Dental advisory given  Plan Discussed with: CRNA  Anesthesia Plan Comments:         Anesthesia Quick Evaluation

## 2017-02-14 ENCOUNTER — Inpatient Hospital Stay (HOSPITAL_COMMUNITY)
Admission: RE | Admit: 2017-02-14 | Discharge: 2017-02-17 | DRG: 470 | Disposition: A | Discharge status: Home Health | Payer: Medicare HMO | Source: Ambulatory Visit | Attending: Orthopaedic Surgery | Admitting: Orthopaedic Surgery

## 2017-02-14 ENCOUNTER — Inpatient Hospital Stay (HOSPITAL_COMMUNITY): Payer: Medicare HMO

## 2017-02-14 ENCOUNTER — Encounter (HOSPITAL_COMMUNITY): Payer: Self-pay | Admitting: *Deleted

## 2017-02-14 ENCOUNTER — Inpatient Hospital Stay (HOSPITAL_COMMUNITY): Payer: Medicare HMO | Admitting: Anesthesiology

## 2017-02-14 ENCOUNTER — Encounter (HOSPITAL_COMMUNITY)
Admission: RE | Disposition: A | Discharge status: Home Health | Payer: Self-pay | Source: Ambulatory Visit | Attending: Orthopaedic Surgery

## 2017-02-14 DIAGNOSIS — E782 Mixed hyperlipidemia: Secondary | ICD-10-CM | POA: Diagnosis present

## 2017-02-14 DIAGNOSIS — Z79899 Other long term (current) drug therapy: Secondary | ICD-10-CM | POA: Diagnosis not present

## 2017-02-14 DIAGNOSIS — Z09 Encounter for follow-up examination after completed treatment for conditions other than malignant neoplasm: Secondary | ICD-10-CM

## 2017-02-14 DIAGNOSIS — M1712 Unilateral primary osteoarthritis, left knee: Secondary | ICD-10-CM | POA: Diagnosis not present

## 2017-02-14 DIAGNOSIS — Z87891 Personal history of nicotine dependence: Secondary | ICD-10-CM

## 2017-02-14 DIAGNOSIS — M25562 Pain in left knee: Secondary | ICD-10-CM | POA: Diagnosis not present

## 2017-02-14 DIAGNOSIS — Z9842 Cataract extraction status, left eye: Secondary | ICD-10-CM | POA: Diagnosis not present

## 2017-02-14 DIAGNOSIS — Z419 Encounter for procedure for purposes other than remedying health state, unspecified: Secondary | ICD-10-CM

## 2017-02-14 DIAGNOSIS — Z9841 Cataract extraction status, right eye: Secondary | ICD-10-CM

## 2017-02-14 DIAGNOSIS — F418 Other specified anxiety disorders: Secondary | ICD-10-CM | POA: Diagnosis not present

## 2017-02-14 DIAGNOSIS — K219 Gastro-esophageal reflux disease without esophagitis: Secondary | ICD-10-CM | POA: Diagnosis present

## 2017-02-14 DIAGNOSIS — Z791 Long term (current) use of non-steroidal anti-inflammatories (NSAID): Secondary | ICD-10-CM

## 2017-02-14 DIAGNOSIS — I739 Peripheral vascular disease, unspecified: Secondary | ICD-10-CM | POA: Diagnosis present

## 2017-02-14 DIAGNOSIS — Z8673 Personal history of transient ischemic attack (TIA), and cerebral infarction without residual deficits: Secondary | ICD-10-CM

## 2017-02-14 DIAGNOSIS — Z96642 Presence of left artificial hip joint: Secondary | ICD-10-CM | POA: Diagnosis not present

## 2017-02-14 DIAGNOSIS — Z888 Allergy status to other drugs, medicaments and biological substances status: Secondary | ICD-10-CM

## 2017-02-14 DIAGNOSIS — G8918 Other acute postprocedural pain: Secondary | ICD-10-CM | POA: Diagnosis not present

## 2017-02-14 DIAGNOSIS — Z472 Encounter for removal of internal fixation device: Secondary | ICD-10-CM | POA: Diagnosis not present

## 2017-02-14 DIAGNOSIS — S82252S Displaced comminuted fracture of shaft of left tibia, sequela: Secondary | ICD-10-CM | POA: Diagnosis not present

## 2017-02-14 DIAGNOSIS — Z471 Aftercare following joint replacement surgery: Secondary | ICD-10-CM | POA: Diagnosis not present

## 2017-02-14 HISTORY — PX: TOTAL KNEE ARTHROPLASTY WITH HARDWARE REMOVAL: SHX6437

## 2017-02-14 LAB — APTT: APTT: 28 s (ref 24–36)

## 2017-02-14 LAB — COMPREHENSIVE METABOLIC PANEL
ALT: 14 U/L — ABNORMAL LOW (ref 17–63)
ANION GAP: 5 (ref 5–15)
AST: 19 U/L (ref 15–41)
Albumin: 3.8 g/dL (ref 3.5–5.0)
Alkaline Phosphatase: 69 U/L (ref 38–126)
BUN: 11 mg/dL (ref 6–20)
CHLORIDE: 109 mmol/L (ref 101–111)
CO2: 26 mmol/L (ref 22–32)
Calcium: 9.3 mg/dL (ref 8.9–10.3)
Creatinine, Ser: 1.25 mg/dL — ABNORMAL HIGH (ref 0.61–1.24)
GFR calc Af Amer: >60 mL/min (ref >60)
GFR, EST NON AFRICAN AMERICAN: 54 mL/min — AB (ref >60)
Glucose, Bld: 99 mg/dL (ref 65–99)
POTASSIUM: 3.7 mmol/L (ref 3.5–5.1)
Sodium: 140 mmol/L (ref 135–145)
TOTAL PROTEIN: 6.4 g/dL — AB (ref 6.5–8.1)
Total Bilirubin: 1 mg/dL (ref 0.3–1.2)

## 2017-02-14 LAB — PROTIME-INR
INR: 1.03
PROTHROMBIN TIME: 13.4 s (ref 11.4–15.2)

## 2017-02-14 SURGERY — REVISION, TOTAL ARTHROPLASTY, KNEE, WITH ARTICULAR COMPONENT REPLACEMENT
Anesthesia: Spinal | Site: Knee | Laterality: Left

## 2017-02-14 MED ORDER — ONDANSETRON HCL 4 MG/2ML IJ SOLN
4.0000 mg | Freq: Once | INTRAMUSCULAR | Status: DC | PRN
Start: 2017-02-14 — End: 2017-02-14

## 2017-02-14 MED ORDER — PRAVASTATIN SODIUM 40 MG PO TABS
80.0000 mg | ORAL_TABLET | Freq: Every day | ORAL | Status: DC
  Administered 2017-02-14 – 2017-02-17 (×4): 80 mg via ORAL
  Filled 2017-02-14 (×4): qty 2

## 2017-02-14 MED ORDER — GLYCOPYRROLATE 0.2 MG/ML IJ SOLN
INTRAMUSCULAR | Status: DC | PRN
  Administered 2017-02-14: 0.2 mg via INTRAVENOUS

## 2017-02-14 MED ORDER — FENTANYL CITRATE (PF) 250 MCG/5ML IJ SOLN
INTRAMUSCULAR | Status: DC | PRN
  Administered 2017-02-14: 50 ug via INTRAVENOUS

## 2017-02-14 MED ORDER — KETAMINE HCL 10 MG/ML IJ SOLN
INTRAMUSCULAR | Status: DC | PRN
  Administered 2017-02-14 (×4): 10 mg via INTRAVENOUS

## 2017-02-14 MED ORDER — BUPIVACAINE HCL (PF) 0.5 % IJ SOLN
INTRAMUSCULAR | Status: AC
  Filled 2017-02-14: qty 30

## 2017-02-14 MED ORDER — PROPOFOL 500 MG/50ML IV EMUL
INTRAVENOUS | Status: DC | PRN
  Administered 2017-02-14: 10 ug/kg/min via INTRAVENOUS

## 2017-02-14 MED ORDER — PROPOFOL 1000 MG/100ML IV EMUL
INTRAVENOUS | Status: AC
  Filled 2017-02-14: qty 100

## 2017-02-14 MED ORDER — FENTANYL CITRATE (PF) 250 MCG/5ML IJ SOLN
INTRAMUSCULAR | Status: AC
  Filled 2017-02-14: qty 5

## 2017-02-14 MED ORDER — LACTATED RINGERS IV SOLN
INTRAVENOUS | Status: DC
  Administered 2017-02-14 (×3): via INTRAVENOUS

## 2017-02-14 MED ORDER — PROPOFOL 10 MG/ML IV BOLUS
INTRAVENOUS | Status: DC | PRN
  Administered 2017-02-14 (×2): 10 mg via INTRAVENOUS
  Administered 2017-02-14 (×2): 20 mg via INTRAVENOUS
  Administered 2017-02-14: 10 mg via INTRAVENOUS

## 2017-02-14 MED ORDER — PROPOFOL 10 MG/ML IV BOLUS
INTRAVENOUS | Status: AC
  Filled 2017-02-14: qty 20

## 2017-02-14 MED ORDER — 0.9 % SODIUM CHLORIDE (POUR BTL) OPTIME
TOPICAL | Status: DC | PRN
  Administered 2017-02-14: 1000 mL

## 2017-02-14 MED ORDER — SODIUM CHLORIDE 0.9 % IV SOLN
INTRAVENOUS | Status: DC
  Administered 2017-02-14 – 2017-02-15 (×2): via INTRAVENOUS

## 2017-02-14 MED ORDER — OXYCODONE HCL 5 MG PO TABS
5.0000 mg | ORAL_TABLET | ORAL | Status: DC | PRN
  Administered 2017-02-14: 10 mg via ORAL
  Administered 2017-02-14: 5 mg via ORAL
  Administered 2017-02-15 – 2017-02-16 (×4): 10 mg via ORAL
  Administered 2017-02-17: 5 mg via ORAL
  Administered 2017-02-17: 10 mg via ORAL
  Filled 2017-02-14: qty 2
  Filled 2017-02-14: qty 1
  Filled 2017-02-14 (×6): qty 2

## 2017-02-14 MED ORDER — BUPIVACAINE LIPOSOME 1.3 % IJ SUSP
20.0000 mL | INTRAMUSCULAR | Status: DC
  Filled 2017-02-14: qty 20

## 2017-02-14 MED ORDER — CHLORHEXIDINE GLUCONATE 4 % EX LIQD: 60.0000 mL | Freq: Once | CUTANEOUS | Status: DC

## 2017-02-14 MED ORDER — METHOCARBAMOL 500 MG PO TABS
500.0000 mg | ORAL_TABLET | Freq: Four times a day (QID) | ORAL | Status: DC | PRN
  Administered 2017-02-14 – 2017-02-17 (×4): 500 mg via ORAL
  Filled 2017-02-14 (×4): qty 1

## 2017-02-14 MED ORDER — BUPIVACAINE-EPINEPHRINE (PF) 0.5% -1:200000 IJ SOLN
INTRAMUSCULAR | Status: DC | PRN
  Administered 2017-02-14: 30 mL via PERINEURAL

## 2017-02-14 MED ORDER — ACETAMINOPHEN 325 MG PO TABS
650.0000 mg | ORAL_TABLET | Freq: Four times a day (QID) | ORAL | Status: DC | PRN
  Administered 2017-02-15 – 2017-02-17 (×5): 650 mg via ORAL
  Filled 2017-02-14 (×5): qty 2

## 2017-02-14 MED ORDER — LIDOCAINE 2% (20 MG/ML) 5 ML SYRINGE
INTRAMUSCULAR | Status: AC
  Filled 2017-02-14: qty 5

## 2017-02-14 MED ORDER — DOCUSATE SODIUM 100 MG PO CAPS
100.0000 mg | ORAL_CAPSULE | Freq: Two times a day (BID) | ORAL | Status: DC
  Administered 2017-02-14 – 2017-02-17 (×6): 100 mg via ORAL
  Filled 2017-02-14 (×6): qty 1

## 2017-02-14 MED ORDER — METOCLOPRAMIDE HCL 5 MG/ML IJ SOLN
5.0000 mg | Freq: Three times a day (TID) | INTRAMUSCULAR | Status: DC | PRN
Start: 2017-02-14 — End: 2017-02-17

## 2017-02-14 MED ORDER — LIDOCAINE HCL (CARDIAC) 20 MG/ML IV SOLN
INTRAVENOUS | Status: DC | PRN
  Administered 2017-02-14: 60 mg via INTRATRACHEAL

## 2017-02-14 MED ORDER — MIDAZOLAM HCL 2 MG/2ML IJ SOLN
INTRAMUSCULAR | Status: AC
  Filled 2017-02-14: qty 2

## 2017-02-14 MED ORDER — MENTHOL 3 MG MT LOZG: 1.0000 | LOZENGE | OROMUCOSAL | Status: DC | PRN

## 2017-02-14 MED ORDER — FENTANYL CITRATE (PF) 100 MCG/2ML IJ SOLN
INTRAMUSCULAR | Status: AC
  Filled 2017-02-14: qty 2

## 2017-02-14 MED ORDER — SODIUM CHLORIDE 0.9 % IR SOLN
Status: DC | PRN
  Administered 2017-02-14: 3000 mL

## 2017-02-14 MED ORDER — KETAMINE HCL-SODIUM CHLORIDE 100-0.9 MG/10ML-% IV SOSY
PREFILLED_SYRINGE | INTRAVENOUS | Status: AC
  Filled 2017-02-14: qty 10

## 2017-02-14 MED ORDER — FENTANYL CITRATE (PF) 100 MCG/2ML IJ SOLN
50.0000 ug | Freq: Once | INTRAMUSCULAR | Status: AC
  Administered 2017-02-14: 50 ug via INTRAVENOUS
  Filled 2017-02-14: qty 1

## 2017-02-14 MED ORDER — POLYETHYLENE GLYCOL 3350 17 G PO PACK: 17.0000 g | PACK | Freq: Every day | ORAL | Status: DC | PRN

## 2017-02-14 MED ORDER — BUPIVACAINE HCL (PF) 0.5 % IJ SOLN
INTRAMUSCULAR | Status: DC | PRN
  Administered 2017-02-14: 30 mL

## 2017-02-14 MED ORDER — PHENYLEPHRINE HCL 10 MG/ML IJ SOLN
INTRAMUSCULAR | Status: DC | PRN
  Administered 2017-02-14: 25 ug/min via INTRAVENOUS

## 2017-02-14 MED ORDER — ONDANSETRON HCL 4 MG PO TABS: 4.0000 mg | ORAL_TABLET | Freq: Four times a day (QID) | ORAL | Status: DC | PRN

## 2017-02-14 MED ORDER — ONDANSETRON HCL 4 MG/2ML IJ SOLN
INTRAMUSCULAR | Status: DC | PRN
  Administered 2017-02-14: 4 mg via INTRAVENOUS

## 2017-02-14 MED ORDER — FENTANYL CITRATE (PF) 100 MCG/2ML IJ SOLN: 25.0000 ug | INTRAMUSCULAR | Status: DC | PRN

## 2017-02-14 MED ORDER — OXYMETAZOLINE HCL 0.05 % NA SOLN
NASAL | Status: AC
  Filled 2017-02-14: qty 15

## 2017-02-14 MED ORDER — ONDANSETRON HCL 4 MG/2ML IJ SOLN
INTRAMUSCULAR | Status: AC
  Filled 2017-02-14: qty 2

## 2017-02-14 MED ORDER — PHENOL 1.4 % MT LIQD: 1.0000 | OROMUCOSAL | Status: DC | PRN

## 2017-02-14 MED ORDER — METHOCARBAMOL 1000 MG/10ML IJ SOLN
500.0000 mg | Freq: Four times a day (QID) | INTRAVENOUS | Status: DC | PRN
  Filled 2017-02-14: qty 5

## 2017-02-14 MED ORDER — CEFAZOLIN SODIUM-DEXTROSE 2-4 GM/100ML-% IV SOLN
2.0000 g | INTRAVENOUS | Status: AC
  Administered 2017-02-14: 2 g via INTRAVENOUS
  Filled 2017-02-14: qty 100

## 2017-02-14 MED ORDER — BUPIVACAINE LIPOSOME 1.3 % IJ SUSP
INTRAMUSCULAR | Status: DC | PRN
  Administered 2017-02-14: 20 mL

## 2017-02-14 MED ORDER — HYDROMORPHONE HCL 1 MG/ML IJ SOLN
INTRAMUSCULAR | Status: AC
  Filled 2017-02-14: qty 1

## 2017-02-14 MED ORDER — ASPIRIN EC 325 MG PO TBEC
325.0000 mg | DELAYED_RELEASE_TABLET | Freq: Every day | ORAL | Status: DC
  Administered 2017-02-15 – 2017-02-17 (×3): 325 mg via ORAL
  Filled 2017-02-14 (×3): qty 1

## 2017-02-14 MED ORDER — ACETAMINOPHEN 650 MG RE SUPP: 650.0000 mg | Freq: Four times a day (QID) | RECTAL | Status: DC | PRN

## 2017-02-14 MED ORDER — ONDANSETRON HCL 4 MG/2ML IJ SOLN: 4.0000 mg | Freq: Four times a day (QID) | INTRAMUSCULAR | Status: DC | PRN

## 2017-02-14 MED ORDER — BUPIVACAINE IN DEXTROSE 0.75-8.25 % IT SOLN
INTRATHECAL | Status: DC | PRN
  Administered 2017-02-14: 1.4 mL via INTRATHECAL

## 2017-02-14 MED ORDER — MIDAZOLAM HCL 2 MG/2ML IJ SOLN
INTRAMUSCULAR | Status: DC | PRN
  Administered 2017-02-14: 1 mg via INTRAVENOUS

## 2017-02-14 MED ORDER — OXYCODONE HCL 5 MG PO TABS
ORAL_TABLET | ORAL | Status: AC
  Administered 2017-02-14: 5 mg via ORAL
  Filled 2017-02-14: qty 1

## 2017-02-14 MED ORDER — HYDROMORPHONE HCL 1 MG/ML IJ SOLN
0.5000 mg | INTRAMUSCULAR | Status: DC | PRN
  Administered 2017-02-14: 1 mg via INTRAVENOUS

## 2017-02-14 MED ORDER — METOCLOPRAMIDE HCL 5 MG PO TABS: 5.0000 mg | ORAL_TABLET | Freq: Three times a day (TID) | ORAL | Status: DC | PRN

## 2017-02-14 SURGICAL SUPPLY — 73 items
APL SKNCLS STERI-STRIP NONHPOA (GAUZE/BANDAGES/DRESSINGS) ×1
BANDAGE ACE 4X5 VEL STRL LF (GAUZE/BANDAGES/DRESSINGS) ×3 IMPLANT
BANDAGE ACE 6X5 VEL STRL LF (GAUZE/BANDAGES/DRESSINGS) ×3 IMPLANT
BANDAGE ESMARK 6X9 LF (GAUZE/BANDAGES/DRESSINGS) ×1 IMPLANT
BENZOIN TINCTURE PRP APPL 2/3 (GAUZE/BANDAGES/DRESSINGS) ×3 IMPLANT
BLADE SAGITTAL 25.0X1.19X90 (BLADE) ×2 IMPLANT
BLADE SAGITTAL 25.0X1.19X90MM (BLADE) ×1
BLADE SAW SGTL 13X75X1.27 (BLADE) ×3 IMPLANT
BNDG CMPR 9X6 STRL LF SNTH (GAUZE/BANDAGES/DRESSINGS) ×1
BNDG CMPR MED 10X6 ELC LF (GAUZE/BANDAGES/DRESSINGS) ×1
BNDG ELASTIC 6X10 VLCR STRL LF (GAUZE/BANDAGES/DRESSINGS) ×3 IMPLANT
BNDG ESMARK 6X9 LF (GAUZE/BANDAGES/DRESSINGS) ×3
BOWL SMART MIX CTS (DISPOSABLE) ×3 IMPLANT
CAPT KNEE TOTAL 3 ATTUNE ×2 IMPLANT
CEMENT HV SMART SET (Cement) ×6 IMPLANT
CLOSURE WOUND 1/2 X4 (GAUZE/BANDAGES/DRESSINGS) ×2
COVER SURGICAL LIGHT HANDLE (MISCELLANEOUS) ×3 IMPLANT
CUFF TOURNIQUET SINGLE 34IN LL (TOURNIQUET CUFF) ×3 IMPLANT
CUFF TOURNIQUET SINGLE 44IN (TOURNIQUET CUFF) IMPLANT
DRAPE ORTHO SPLIT 77X108 STRL (DRAPES) ×6
DRAPE SURG ORHT 6 SPLT 77X108 (DRAPES) ×2 IMPLANT
DRAPE U-SHAPE 47X51 STRL (DRAPES) ×3 IMPLANT
DRSG PAD ABDOMINAL 8X10 ST (GAUZE/BANDAGES/DRESSINGS) ×3 IMPLANT
DURAPREP 26ML APPLICATOR (WOUND CARE) ×6 IMPLANT
ELECT REM PT RETURN 9FT ADLT (ELECTROSURGICAL) ×3
ELECTRODE REM PT RTRN 9FT ADLT (ELECTROSURGICAL) ×1 IMPLANT
EVACUATOR 1/8 PVC DRAIN (DRAIN) IMPLANT
FACESHIELD WRAPAROUND (MASK) ×6 IMPLANT
FACESHIELD WRAPAROUND OR TEAM (MASK) ×2 IMPLANT
GAUZE SPONGE 4X4 12PLY STRL (GAUZE/BANDAGES/DRESSINGS) ×3 IMPLANT
GAUZE XEROFORM 5X9 LF (GAUZE/BANDAGES/DRESSINGS) ×3 IMPLANT
GLOVE BIOGEL PI IND STRL 8 (GLOVE) ×2 IMPLANT
GLOVE BIOGEL PI INDICATOR 8 (GLOVE) ×4
GLOVE ORTHO TXT STRL SZ7.5 (GLOVE) ×6 IMPLANT
GOWN STRL REUS W/ TWL LRG LVL3 (GOWN DISPOSABLE) ×1 IMPLANT
GOWN STRL REUS W/ TWL XL LVL3 (GOWN DISPOSABLE) ×1 IMPLANT
GOWN STRL REUS W/TWL 2XL LVL3 (GOWN DISPOSABLE) ×3 IMPLANT
GOWN STRL REUS W/TWL LRG LVL3 (GOWN DISPOSABLE) ×3
GOWN STRL REUS W/TWL XL LVL3 (GOWN DISPOSABLE) ×3
HANDPIECE INTERPULSE COAX TIP (DISPOSABLE) ×3
IMMOBILIZER KNEE 22 UNIV (SOFTGOODS) ×3 IMPLANT
KIT BASIN OR (CUSTOM PROCEDURE TRAY) ×3 IMPLANT
KIT ROOM TURNOVER OR (KITS) ×3 IMPLANT
MANIFOLD NEPTUNE II (INSTRUMENTS) ×3 IMPLANT
MARKER SKIN DUAL TIP RULER LAB (MISCELLANEOUS) ×3 IMPLANT
NDL 18GX1X1/2 (RX/OR ONLY) (NEEDLE) ×1 IMPLANT
NDL HYPO 25GX1X1/2 BEV (NEEDLE) ×1 IMPLANT
NEEDLE 18GX1X1/2 (RX/OR ONLY) (NEEDLE) ×3 IMPLANT
NEEDLE HYPO 25GX1X1/2 BEV (NEEDLE) ×3 IMPLANT
NS IRRIG 1000ML POUR BTL (IV SOLUTION) ×3 IMPLANT
PACK TOTAL JOINT (CUSTOM PROCEDURE TRAY) ×3 IMPLANT
PAD ABD 8X10 STRL (GAUZE/BANDAGES/DRESSINGS) ×2 IMPLANT
PAD ARMBOARD 7.5X6 YLW CONV (MISCELLANEOUS) ×6 IMPLANT
PAD CAST 4YDX4 CTTN HI CHSV (CAST SUPPLIES) ×1 IMPLANT
PADDING CAST COTTON 4X4 STRL (CAST SUPPLIES) ×3
PADDING CAST COTTON 6X4 STRL (CAST SUPPLIES) ×3 IMPLANT
SET HNDPC FAN SPRY TIP SCT (DISPOSABLE) ×1 IMPLANT
STAPLER VISISTAT 35W (STAPLE) ×3 IMPLANT
STRIP CLOSURE SKIN 1/2X4 (GAUZE/BANDAGES/DRESSINGS) ×4 IMPLANT
SUCTION FRAZIER HANDLE 10FR (MISCELLANEOUS) ×2
SUCTION TUBE FRAZIER 10FR DISP (MISCELLANEOUS) ×1 IMPLANT
SUT VIC AB 0 CT1 27 (SUTURE) ×3
SUT VIC AB 0 CT1 27XBRD ANBCTR (SUTURE) ×1 IMPLANT
SUT VIC AB 1 CTX 36 (SUTURE) ×6
SUT VIC AB 1 CTX36XBRD ANBCTR (SUTURE) ×2 IMPLANT
SUT VIC AB 2-0 CT1 27 (SUTURE) ×6
SUT VIC AB 2-0 CT1 TAPERPNT 27 (SUTURE) ×2 IMPLANT
SUT VIC AB 3-0 X1 27 (SUTURE) ×3 IMPLANT
SYR 50ML LL SCALE MARK (SYRINGE) ×3 IMPLANT
SYR CONTROL 10ML LL (SYRINGE) ×3 IMPLANT
TOWEL OR 17X24 6PK STRL BLUE (TOWEL DISPOSABLE) ×3 IMPLANT
TOWEL OR 17X26 10 PK STRL BLUE (TOWEL DISPOSABLE) ×3 IMPLANT
TRAY CATH 16FR W/PLASTIC CATH (SET/KITS/TRAYS/PACK) IMPLANT

## 2017-02-14 NOTE — H&P (Signed)
Terry Sloan  Patient is being admitted for left Terry knee arthroplasty.  Subjective:  Chief Complaint:left knee pain.  HPI: Terry Sloan, 76 y.o. male, has a history of pain and functional disability in the left knee due to arthritis and has failed non-surgical conservative treatments for greater than 12 weeks to includeNSAID's and/or analgesics, corticosteriod injections, use of assistive devices and activity modification.  Onset of symptoms was gradual, starting 10 years ago with gradually worsening course since that time.  Patient currently rates pain in the left knee(s) at 10 out of 10 with activity. Patient has night pain, worsening of pain with activity and weight bearing, crepitus and joint swelling.  Patient has evidence of subchondral sclerosis, periarticular osteophytes and joint space narrowing by imaging studies.  There is no active infection.  Patient Active Problem List   Diagnosis Date Noted  . Unilateral primary osteoarthritis, left knee 02/03/2017  . Knee pain, left 11/03/2016  . Transient cerebral ischemia 10/16/2016  . Aphasia 10/16/2016  . Anxiety and depression 10/16/2016  . Leg pain, diffuse 10/07/2016  . PVD (peripheral vascular disease) (HCC) 09/23/2016  . Mixed hyperlipidemia 09/23/2016  . Weight loss, unintentional 09/23/2016   Past Medical History:  Diagnosis Date  . Arthritis   . GERD (gastroesophageal reflux disease)    [name]  . History of kidney stones   . Hyperlipidemia   . Macular degeneration   . PVD (peripheral vascular disease) (HCC)   . Stroke [facility])    TIA      Past Surgical History:  Procedure Laterality Date  . Bone turmor Left    foreheah  . EYE SURGERY Bilateral    cataract removal  . FRACTURE SURGERY Left    leg   pins  . KNEE SURGERY Left   . NECK SURGERY     Tumor reomoved from head/neck in the 60s  . ROTATOR CUFF REPAIR Right    times 2    Prescriptions Prior to Admission  Medication Sig Dispense Refill Last  Dose  . naproxen sodium (ANAPROX) 220 MG tablet Take 220 mg by mouth daily.   Past Week at Unknown time  . pravastatin (PRAVACHOL) 80 MG tablet Take 1 tablet (80 mg Terry) by mouth daily. 90 tablet 3 02/14/2017 at 0500   Allergies  Allergen Reactions  . Celebrex [Celecoxib] Other (See Comments)    Leg swelling, broke out in rash    Social History  Substance Use Topics  . Smoking status: Former Smoker    Packs/day: 1.50    Years: 20.00    Types: Cigarettes  . Smokeless tobacco: Never Used     Comment: Quit 20+ year ago  . Alcohol use Yes     Comment: Occasional beer    Family History  Problem Relation Age of Onset  . Stroke Neg Hx      Review of Systems  HENT: Negative.   Respiratory: Negative.   Cardiovascular: Negative.   Genitourinary: Negative.   Musculoskeletal: Positive for joint pain.  Skin: Negative.   Neurological: Negative.   Psychiatric/Behavioral: Negative.     Objective:  Physical Exam  Constitutional: He is oriented to person, place, and time. He appears well-developed.  HENT:  Head: Normocephalic and atraumatic.  Eyes: Pupils are equal, round, and reactive to light.  Neck: Normal range of motion.  Respiratory: No respiratory distress.  GI: He exhibits no distension.  Musculoskeletal: He exhibits tenderness.  Neurological: He is alert and oriented to person, place, and time.  Skin: Skin is warm and dry.  Psychiatric: He has a normal mood and affect.    Vital signs in last 24 hours: Temp:  [98.7 F (37.1 C)] 98.7 F (37.1 C) (09/14 0809) Pulse Rate:  [61] 61 (09/14 0809) Resp:  [19] 19 (09/14 0809) BP: (139)/(73) 139/73 (09/14 0809) SpO2:  [98 %] 98 % (09/14 0809) Weight:  [183 lb (83 kg)] 183 lb (83 kg) (09/14 0818)  Labs:   Estimated body mass index is 25.17 kg/m as calculated from the following:   Height as of this encounter: 5' 11.5" (1.816 m).   Weight as of this encounter: 183 lb (83 kg).   Imaging Review Plain radiographs  demonstrate moderate degenerative joint disease of the left knee(s). The overall alignment ismild varus. The bone quality appears to be good for age and reported activity level.  Assessment/Plan:  End stage arthritis, left knee   The patient history, physical examination, clinical judgment of the provider and imaging studies are consistent with end stage degenerative joint disease of the left knee(s) and Terry knee arthroplasty is deemed medically necessary. The treatment options including medical management, injection therapy arthroscopy and arthroplasty were discussed at length. The risks and benefits of Terry knee arthroplasty were presented and reviewed. The risks due to aseptic loosening, infection, stiffness, patella tracking problems, thromboembolic complications and other imponderables were discussed. The patient acknowledged the explanation, agreed to proceed with the plan and consent was signed. Patient is being admitted for inpatient treatment for surgery, pain control, PT, OT, prophylactic antibiotics, VTE prophylaxis, progressive ambulation and ADL's and discharge planning. The patient is planning to be discharged home with home health services

## 2017-02-14 NOTE — Op Note (Signed)
Preop diagnosis: Left knee primary osteoarthritis with retained left tibial nail with interlock.  Postop diagnosis: Same  Procedure: Removal of the left tibial nail with proximal interlock, cemented left total knee arthroplasty.  Surgeon: Annell Greening M.D.  Assistant: Zonia Kief PA-C medically necessary and present for the entire procedure  Anesthesia: Spinal  Tourniquet time: 97 minutes 350  Brief history 76 year old male had previous tibial fracture treated with tibial nail that healed successfully. Has progressive left knee osteoarthritis felt conservative treatment including anti-inflammatory cc Marcaine intra-articular injections with persistent pain with activities.  Procedure after standard prepping and draping timeout procedure DuraPrep spinal anesthesia proximal thigh tourniquet lateral post heel bump usual total knee sheets drapes impervious stockinette Coban were applied sterile skin marker was used and Betadine Steri-Drape was applied. Midline incision was made biasing it laterally to incorporate the old tibial nail insertion incision. Patella was everted 10 mm resected marginal osteophytes tricompartmental degenerative changes were visualized. C-arm was brought in stab incision was made using C-arm visualization and the proximal interlock that came from lateral to medial just 1 cm anterior to the fibula was identified under fluoroscopy and partially loosened leaving and prevent nail rotation. Intramedullary drill was placed up the femur initially the femur was cut but appeared to been cut slightly in valgus after tibial cut was made top of the rod was visualized soft tissue was removed from the top of the nail and the nail was restricted extracted with some difficulty due to scar tissue and likely bone performed through the distal interlock holes. Nail was finally removed tibial cut was made was used to was extremely tight medially and additional 2 mm were taken off of the femur  redrilling all replacing the intramedullary rod and then correcting the valgus of the femoral cut. Tibial cut was made with additional 2 mm resected and block inserted appropriately and there is full extension. This was with 5 mm block. Chamfer cuts box cut tibial keel preparation was performed patella was drilled for 41 mm patella. Posterior femoral spurs removed posterior medially anterior cruciate ligament PCL all menisci were resected. Pulsatile lavage preparation of the bone and tibial component was cemented first ball by femur and then insertion of the Polly and holding the patella with a clamp. Ligaments had been drilled in the femur. All excessive cement was removed the reach full extension and everything was held still until the cement was hard at 15 minutes. There was good collateral balance in flexion-extension. Tourniquet deflated hemostasis obtained in standard layer closure with staples on the skin postoperative knee immobilizer. Instrument count needle count was correct. Patient tolerated procedure well transferred recovery room in stable condition.

## 2017-02-14 NOTE — Anesthesia Postprocedure Evaluation (Signed)
Anesthesia Post Note  Patient: Terry Sloan  Procedure(s) Performed: Procedure(s) (LRB): Removal Left Tibia Nail, Cemented Total Knee Arthroplasty (Left)     Patient location during evaluation: PACU Anesthesia Type: Spinal Level of consciousness: awake and alert Pain management: pain level controlled Vital Signs Assessment: post-procedure vital signs reviewed and stable Respiratory status: spontaneous breathing and respiratory function stable Cardiovascular status: blood pressure returned to baseline and stable Postop Assessment: spinal receding and no apparent nausea or vomiting Anesthetic complications: no    Last Vitals:  Vitals:   02/14/17 1530 02/14/17 1545  BP:    Pulse: (!) 59 (!) 59  Resp: 13 15  Temp:    SpO2: 100% 100%    Last Pain:  Vitals:   02/14/17 1530  TempSrc:   PainSc: 5                  Beryle Lathe

## 2017-02-14 NOTE — Progress Notes (Addendum)
Order for UA noted instructed pt to collect if he is able to void. Voices understanding.  UA speci cup let at bedside.

## 2017-02-14 NOTE — Anesthesia Procedure Notes (Signed)
Spinal  Patient location during procedure: OR Start time: 02/14/2017 10:49 AM End time: 02/14/2017 10:59 AM Staffing Anesthesiologist: Leslye Peer E Performed: anesthesiologist  Preanesthetic Checklist Completed: patient identified, surgical consent, pre-op evaluation, timeout performed, IV checked, risks and benefits discussed and monitors and equipment checked Spinal Block Patient position: sitting Prep: DuraPrep Patient monitoring: heart rate, cardiac monitor, continuous pulse ox and blood pressure Approach: midline Location: L3-4 Injection technique: single-shot Needle Needle type: Pencan  Needle gauge: 24 G Additional Notes Functioning IV was confirmed and monitors were applied. Sterile prep and drape, including hand hygiene, mask, and sterile gloves were used. The patient was positioned and the spine was prepped. The skin was anesthetized with lidocaine. Free flow of clear CSF was obtained prior to injecting local anesthetic into the CSF. The spinal needle aspirated freely following injection. The needle was carefully withdrawn. The patient tolerated the procedure well. Consent was obtained prior to the procedure with all questions answered and concerns addressed. Risks including, but not limited to, bleeding, infection, nerve damage, paralysis, failed block, inadequate analgesia, allergic reaction, high spinal, itching, and headache were discussed and the patient wished to proceed.  Leslye Peer, MD

## 2017-02-14 NOTE — Anesthesia Procedure Notes (Signed)
Anesthesia Regional Block: Adductor canal block   Pre-Anesthetic Checklist: ,, timeout performed, Correct Patient, Correct Site, Correct Laterality, Correct Procedure, Correct Position, site marked, Risks and benefits discussed,  Surgical consent,  Pre-op evaluation,  At surgeon's request and post-op pain management  Laterality: Left  Prep: chloraprep       Needles:  Injection technique: Single-shot  Needle Type: Echogenic Needle     Needle Length: 9cm  Needle Gauge: 21     Additional Needles:   Narrative:  Start time: 02/14/2017 9:24 AM End time: 02/14/2017 9:28 AM Injection made incrementally with aspirations every 5 mL.  Performed by: Personally  Anesthesiologist: Leslye Peer E  Additional Notes: No pain on injection. No increased resistance to injection. Injection made in 5cc increments. Good needle visualization. Patient tolerated the procedure well.

## 2017-02-14 NOTE — Progress Notes (Signed)
Dr. Ophelia Charter called to inform about surgical site continuing to ooze blood. Orders received to completely take off dressing and apply gauze, ABD pads and ace wrap.

## 2017-02-14 NOTE — Interval H&P Note (Signed)
History and Physical Interval Note:  02/14/2017 9:35 AM  Terry Sloan  has presented today for surgery, with the diagnosis of Left Knee Osteoarthritis   The various methods of treatment have been discussed with the patient and family. After consideration of risks, benefits and other options for treatment, the patient has consented to  Procedure(s): Removal Left Tibia Nail, Cemented Total Knee Arthroplasty (Left) as a surgical intervention .  The patient's history has been reviewed, patient examined, no change in status, stable for surgery.  I have reviewed the patient's chart and labs.  Questions were answered to the patient's satisfaction.     Eldred Manges

## 2017-02-14 NOTE — Progress Notes (Signed)
Orthopedic Tech Progress Note Patient Details:  Terry Sloan 04/27/1941 [id number]  CPM Left Knee CPM Left Knee: On Left Knee Flexion (Degrees): 90 Left Knee Extension (Degrees): 0 Additional Comments: applied cpm to pt left knee. pt tolerated application very well.  left knee.   Alvina Chou 02/14/2017, 3:23 PM

## 2017-02-14 NOTE — Transfer of Care (Signed)
Immediate Anesthesia Transfer of Care Note  Patient: Terry Sloan  Procedure(s) Performed: Procedure(s): Removal Left Tibia Nail, Cemented Total Knee Arthroplasty (Left)  Patient Location: PACU  Anesthesia Type:Spinal  Level of Consciousness: awake, alert  and patient cooperative  Airway & Oxygen Therapy: Patient Spontanous Breathing and Patient connected to nasal cannula oxygen  Post-op Assessment: Report given to RN, Patient moving all extremities X 4 and Patient able to stick tongue midline  Post vital signs: Reviewed and stable  Last Vitals:  Vitals:   02/14/17 1025 02/14/17 1030  BP: (!) 119/57 (!) 124/56  Pulse: 66 63  Resp: (!) 22 12  Temp:    SpO2: 99% 99%    Last Pain:  Vitals:   02/14/17 0809  TempSrc: Oral         Complications: No apparent anesthesia complications

## 2017-02-14 NOTE — OR Nursing (Signed)
Called into OR 2 to notify Zonia Kief PA of Mr. Montilla's oozing dressing. Dr. Otelia Sergeant returned the call and stated to reinforce as needed with abd dsg and gauze with either a kerlix or ACE wrap.

## 2017-02-15 LAB — BASIC METABOLIC PANEL
Anion gap: 5 (ref 5–15)
BUN: 12 mg/dL (ref 6–20)
CO2: 26 mmol/L (ref 22–32)
Calcium: 8.1 mg/dL — ABNORMAL LOW (ref 8.9–10.3)
Chloride: 106 mmol/L (ref 101–111)
Creatinine, Ser: 1.33 mg/dL — ABNORMAL HIGH (ref 0.61–1.24)
GFR calc Af Amer: 58 mL/min — ABNORMAL LOW (ref >60)
GFR, EST NON AFRICAN AMERICAN: 50 mL/min — AB (ref >60)
GLUCOSE: 134 mg/dL — AB (ref 65–99)
POTASSIUM: 4 mmol/L (ref 3.5–5.1)
SODIUM: 137 mmol/L (ref 135–145)

## 2017-02-15 LAB — CBC
HCT: 31.1 % — ABNORMAL LOW (ref 39.0–52.0)
Hemoglobin: 10.2 g/dL — ABNORMAL LOW (ref 13.0–17.0)
MCH: 31.4 pg (ref 26.0–34.0)
MCHC: 32.8 g/dL (ref 30.0–36.0)
MCV: 95.7 fL (ref 78.0–100.0)
PLATELETS: 163 10*3/uL (ref 150–400)
RBC: 3.25 MIL/uL — AB (ref 4.22–5.81)
RDW: 12.3 % (ref 11.5–15.5)
WBC: 6.4 10*3/uL (ref 4.0–10.5)

## 2017-02-15 NOTE — Progress Notes (Signed)
Physical Therapy Treatment Patient Details Name: Terry Sloan MRN: [id number] DOB: 02/08/1941 Today's Date: 02/15/2017    History of Present Illness Pt is a 76 y.o. male s/p Removal L tibia nail and Cemented L TKA. PMHx: Transient cerebral ischemia, Aphasia, Anxiety, Depression, PVD, Hyperlipidemia, Arthritis, GERD, Macular degeneration, TIA.    PT Comments    Mobility hindered by pain. Pt encouraged to take pain meds as needed. Gait distance limited to 10 feet due to pain. Knee immobilizer not utilized this session.   Follow Up Recommendations  DC plan and follow up therapy as arranged by surgeon;Supervision/Assistance - 24 hour     Equipment Recommendations  Rolling walker with 5" wheels;3in1 (PT)    Recommendations for Other Services       Precautions / Restrictions Precautions Precautions: Fall;Knee Restrictions LLE Weight Bearing: Weight bearing as tolerated    Mobility  Bed Mobility   Bed Mobility: Sit to Supine     Supine to sit: Min assist     General bed mobility comments: Assist for LLE into bed. Cues for sequencing and technique.  Transfers   Equipment used: Rolling walker (2 wheeled)   Sit to Stand: Min assist         General transfer comment: verbal cues for sequencing and hand placement. Increased time to stabilize initial standing balance.  Ambulation/Gait Ambulation/Gait assistance: Min assist Ambulation Distance (Feet): 10 Feet Assistive device: Rolling walker (2 wheeled) Gait Pattern/deviations: Step-to pattern;Antalgic;Decreased weight shift to left;Decreased stance time - left;Trunk flexed Gait velocity: decreased Gait velocity interpretation: Below normal speed for age/gender General Gait Details: distance limited by pain, verbal cues for sequencing and posture   Stairs            Wheelchair Mobility    Modified Rankin (Stroke Patients Only)       Balance                                             Cognition Arousal/Alertness: Awake/alert Behavior During Therapy: WFL for tasks assessed/performed Overall Cognitive Status: Within Functional Limits for tasks assessed                                        Exercises Total Joint Exercises Ankle Circles/Pumps: AROM;Both;10 reps;Supine Quad Sets: AROM;Left;10 reps;Supine Heel Slides: AAROM;Left;10 reps;Supine Hip ABduction/ADduction: AAROM;Left;10 reps;Supine Goniometric ROM: 7-55 L knee    General Comments        Pertinent Vitals/Pain Pain Assessment: 0-10 Pain Score: 5  Pain Location: L knee Pain Descriptors / Indicators: Sore;Grimacing;Guarding;Spasm Pain Intervention(s): Limited activity within patient's tolerance;Monitored during session;Patient requesting pain meds-RN notified    Home Living                      Prior Function            PT Goals (current goals can now be found in the care plan section) Acute Rehab PT Goals Patient Stated Goal: decrease pain and get better PT Goal Formulation: With patient Time For Goal Achievement: 02/22/17 Potential to Achieve Goals: Good Progress towards PT goals: Progressing toward goals    Frequency    7X/week      PT Plan Current plan remains appropriate    Co-evaluation  AM-PAC PT "6 Clicks" Daily Activity  Outcome Measure  Difficulty turning over in bed (including adjusting bedclothes, sheets and blankets)?: A Little Difficulty moving from lying on back to sitting on the side of the bed? : Unable Difficulty sitting down on and standing up from a chair with arms (e.g., wheelchair, bedside commode, etc,.)?: Unable Help needed moving to and from a bed to chair (including a wheelchair)?: A Little Help needed walking in hospital room?: A Little Help needed climbing 3-5 steps with a railing? : A Lot 6 Click Score: 13    End of Session Equipment Utilized During Treatment: Gait belt Activity Tolerance: Patient limited by  pain Patient left: in bed;in CPM;with call bell/phone within reach;with family/visitor present Nurse Communication: Patient requests pain meds;Mobility status PT Visit Diagnosis: Difficulty in walking, not elsewhere classified (R26.2);Pain Pain - Right/Left: Left Pain - part of body: Knee     Time: 1451-1521 PT Time Calculation (min) (ACUTE ONLY): 30 min  Charges:  $Gait Training: 8-22 mins $Therapeutic Exercise: 8-22 mins                    G Codes:       Aida Raider, PT  Office # [phone] Pager [phone]    Ilda Foil 02/15/2017, 3:23 PM

## 2017-02-15 NOTE — Evaluation (Signed)
Physical Therapy Evaluation Patient Details Name: Terry Sloan MRN: [id number] DOB: 26-Aug-1940 Today's Date: 02/15/2017   History of Present Illness  Pt is a 76 y.o. male s/p Removal L tibia nail and Cemented L TKA. PMHx: Transient cerebral ischemia, Aphasia, Anxiety, Depression, PVD, Hyperlipidemia, Arthritis, GERD, Macular degeneration, TIA.  Clinical Impression  Pt is s/p TKA resulting in the deficits listed below (see PT Problem List). On eval, pt required min assist for all aspects of functional mobility. Gait distance limited by pain to 5 feet with RW.  Pt will benefit from skilled PT to increase their independence and safety with mobility to allow discharge to the venue listed below.      Follow Up Recommendations DC plan and follow up therapy as arranged by surgeon;Supervision/Assistance - 24 hour    Equipment Recommendations  Rolling walker with 5" wheels;3in1 (PT)    Recommendations for Other Services       Precautions / Restrictions Precautions Precautions: Fall;Knee Precaution Comments: Educated pt on no pillow under knee Required Braces or Orthoses: Knee Immobilizer - Left Knee Immobilizer - Left: On except when in CPM;On at all times Restrictions Weight Bearing Restrictions: Yes LLE Weight Bearing: Weight bearing as tolerated      Mobility  Bed Mobility Overal bed mobility: Needs Assistance Bed Mobility: Supine to Sit     Supine to sit: Min assist;HOB elevated     General bed mobility comments: Assist for LLE to EOB. Cues for sequencing and technique  Transfers Overall transfer level: Needs assistance Equipment used: Rolling walker (2 wheeled) Transfers: Sit to/from Stand Sit to Stand: Min assist         General transfer comment: verbal cues for sequencing and hand placement. Increased time to stabilize initial standing balance.  Ambulation/Gait Ambulation/Gait assistance: Min assist Ambulation Distance (Feet): 5 Feet Assistive device: Rolling  walker (2 wheeled) Gait Pattern/deviations: Step-to pattern;Antalgic;Decreased weight shift to left;Decreased stance time - left;Trunk flexed Gait velocity: decreased Gait velocity interpretation: Below normal speed for age/gender General Gait Details: distance limited by pain  Stairs            Wheelchair Mobility    Modified Rankin (Stroke Patients Only)       Balance Overall balance assessment: Needs assistance Sitting-balance support: Feet supported;No upper extremity supported Sitting balance-Leahy Scale: Good     Standing balance support: Bilateral upper extremity supported;During functional activity Standing balance-Leahy Scale: Poor                               Pertinent Vitals/Pain Pain Assessment: Faces Faces Pain Scale: Hurts even more Pain Location: L knee Pain Descriptors / Indicators: Grimacing;Guarding;Moaning Pain Intervention(s): Limited activity within patient's tolerance;Monitored during session;Repositioned    Home Living Family/patient expects to be discharged to:: Private residence Living Arrangements: Spouse/significant other;Children Available Help at Discharge: Family;Available 24 hours/day Type of Home: House Home Access: Level entry     Home Layout: One level Home Equipment: None      Prior Function Level of Independence: Independent         Comments: Managing independently but painful. Reports hx of multiple falls     Hand Dominance        Extremity/Trunk Assessment   Upper Extremity Assessment Upper Extremity Assessment: Overall WFL for tasks assessed    Lower Extremity Assessment Lower Extremity Assessment: Defer to PT evaluation    Cervical / Trunk Assessment Cervical / Trunk Assessment: Kyphotic  Communication  Communication: No difficulties;HOH  Cognition Arousal/Alertness: Awake/alert Behavior During Therapy: WFL for tasks assessed/performed Overall Cognitive Status: Within Functional Limits  for tasks assessed                                        General Comments      Exercises Total Joint Exercises Ankle Circles/Pumps: AROM;Both;10 reps   Assessment/Plan    PT Assessment Patient needs continued PT services  PT Problem List Decreased strength;Decreased range of motion;Decreased activity tolerance;Decreased balance;Decreased mobility;Decreased knowledge of use of DME;Decreased safety awareness;Decreased knowledge of precautions;Pain       PT Treatment Interventions DME instruction;Gait training;Functional mobility training;Balance training;Therapeutic exercise;Therapeutic activities;Patient/family education    PT Goals (Current goals can be found in the Care Plan section)  Acute Rehab PT Goals Patient Stated Goal: decrease pain and get better PT Goal Formulation: With patient Time For Goal Achievement: 02/22/17 Potential to Achieve Goals: Good    Frequency 7X/week   Barriers to discharge        Co-evaluation               AM-PAC PT "6 Clicks" Daily Activity  Outcome Measure Difficulty turning over in bed (including adjusting bedclothes, sheets and blankets)?: A Little Difficulty moving from lying on back to sitting on the side of the bed? : Unable Difficulty sitting down on and standing up from a chair with arms (e.g., wheelchair, bedside commode, etc,.)?: Unable Help needed moving to and from a bed to chair (including a wheelchair)?: A Little Help needed walking in hospital room?: A Little Help needed climbing 3-5 steps with a railing? : A Lot 6 Click Score: 13    End of Session Equipment Utilized During Treatment: Gait belt;Left knee immobilizer Activity Tolerance: Patient limited by pain Patient left: in chair;with call bell/phone within reach Nurse Communication: Mobility status PT Visit Diagnosis: Difficulty in walking, not elsewhere classified (R26.2);Pain Pain - Right/Left: Left Pain - part of body: Knee    Time:  1040-1056 PT Time Calculation (min) (ACUTE ONLY): 16 min   Charges:   PT Evaluation $PT Eval Moderate Complexity: 1 Mod     PT G Codes:        Aida Raider, PT  Office # [phone] Pager [phone]   Ilda Foil 02/15/2017, 11:03 AM

## 2017-02-15 NOTE — Care Management Note (Signed)
Case Management Note  Patient Details  Name: XAIDYN KEPNER MRN: [id number] Date of Birth: 1940-10-29  Subjective/Objective:                 Spoke w patient's daughter, they will need RW and 3/1, and HH PT at home for DC tomorrow. Orders places per CM consult and referrals called in to [facility] at request of daughter.    Action/Plan:   Expected Discharge Date:                  Expected Discharge Plan:  Home w Home Health Services  In-House Referral:     Discharge planning Services  CM Consult  Post Acute Care Choice:  Durable Medical Equipment, Home Health Choice offered to:  Adult Children  DME Arranged:  3-N-1, Walker rolling DME Agency:  [facility]  HH Arranged:  PT HH Agency:  [facility]  Status of Service:  Completed, signed off  If discussed at Long Length of Stay Meetings, dates discussed:    Additional Comments:  Lawerance Sabal, RN 02/15/2017, 1:23 PM

## 2017-02-15 NOTE — Evaluation (Signed)
Occupational Therapy Evaluation Patient Details Name: Terry Sloan MRN: [id number] DOB: 1941/05/12 Today's Date: 02/15/2017    History of Present Illness Pt is a 76 y.o. male s/p Removal L tibia nail and Cemented L TKA. PMHx: Transient cerebral ischemia, Aphasia, Anxiety, Depression, PVD, Hyperlipidemia, Arthritis, GERD, Macular degeneration, TIA.   Clinical Impression   Pt reports he was managing ADL independently PTA. Currently pt requires min assist for functional mobility and mod assist for LB ADL. Began ADL, knee, and safety education with pt. Pt planning to dc/ home with 24/7 supervision from family. Pt would benefit from continued skilled OT to address established goals.    Follow Up Recommendations  DC plan and follow up therapy as arranged by surgeon;Supervision/Assistance - 24 hour    Equipment Recommendations  3 in 1 bedside commode    Recommendations for Other Services PT consult     Precautions / Restrictions Precautions Precautions: Fall;Knee Required Braces or Orthoses: Knee Immobilizer - Left Knee Immobilizer - Left: On except when in CPM;On at all times Restrictions Weight Bearing Restrictions: Yes LLE Weight Bearing: Weight bearing as tolerated      Mobility Bed Mobility Overal bed mobility: Needs Assistance Bed Mobility: Supine to Sit     Supine to sit: Min assist;HOB elevated     General bed mobility comments: Assist for LLE to EOB. Cues for sequencing and technique  Transfers Overall transfer level: Needs assistance Equipment used: Rolling walker (2 wheeled) Transfers: Sit to/from Stand Sit to Stand: Min assist         General transfer comment: Steadying assist. Cues for hand placement and technique    Balance Overall balance assessment: Needs assistance Sitting-balance support: Feet supported;No upper extremity supported Sitting balance-Leahy Scale: Good     Standing balance support: Single extremity supported;During functional  activity Standing balance-Leahy Scale: Poor                             ADL either performed or assessed with clinical judgement   ADL Overall ADL's : Needs assistance/impaired Eating/Feeding: Set up;Sitting   Grooming: Set up;Supervision/safety;Sitting   Upper Body Bathing: Set up;Supervision/ safety;Sitting   Lower Body Bathing: Moderate assistance;Sit to/from stand   Upper Body Dressing : Set up;Supervision/safety;Sitting   Lower Body Dressing: Moderate assistance;Sit to/from stand Lower Body Dressing Details (indicate cue type and reason): Educated on L leg first into underwear. Required assist to start over feet and pull up in standing Toilet Transfer: Minimal assistance;Ambulation;BSC;RW           Functional mobility during ADLs: Minimal assistance;Rolling walker       Vision         Perception     Praxis      Pertinent Vitals/Pain Pain Assessment: Faces Faces Pain Scale: Hurts even more Pain Location: L knee with weight bearing Pain Descriptors / Indicators: Aching;Grimacing;Guarding;Operative site guarding Pain Intervention(s): Monitored during session;Limited activity within patient's tolerance;Repositioned     Hand Dominance     Extremity/Trunk Assessment Upper Extremity Assessment Upper Extremity Assessment: Overall WFL for tasks assessed   Lower Extremity Assessment Lower Extremity Assessment: Defer to PT evaluation   Cervical / Trunk Assessment Cervical / Trunk Assessment: Kyphotic   Communication Communication Communication: No difficulties;HOH   Cognition Arousal/Alertness: Awake/alert Behavior During Therapy: WFL for tasks assessed/performed Overall Cognitive Status: Within Functional Limits for tasks assessed  General Comments       Exercises     Shoulder Instructions      Home Living Family/patient expects to be discharged to:: Private residence Living  Arrangements: Spouse/significant other;Children Available Help at Discharge: Family;Available 24 hours/day Type of Home: House Home Access: Level entry     Home Layout: One level     Bathroom Shower/Tub: Producer, television/film/video: Standard     Home Equipment: None          Prior Functioning/Environment Level of Independence: Independent        Comments: Managing independently but painful. Reports hx of multiple falls        OT Problem List: Decreased strength;Decreased range of motion;Decreased activity tolerance;Impaired balance (sitting and/or standing);Decreased knowledge of use of DME or AE;Decreased safety awareness;Decreased knowledge of precautions;Pain      OT Treatment/Interventions: Self-care/ADL training;Energy conservation;DME and/or AE instruction;Therapeutic activities;Patient/family education;Balance training    OT Goals(Current goals can be found in the care plan section) Acute Rehab OT Goals Patient Stated Goal: decrease pain and get better OT Goal Formulation: With patient Time For Goal Achievement: 03/01/17 Potential to Achieve Goals: Good ADL Goals Pt Will Perform Lower Body Bathing: with supervision;sit to/from stand Pt Will Perform Lower Body Dressing: with supervision;sit to/from stand Pt Will Transfer to Toilet: with supervision;ambulating;bedside commode Pt Will Perform Toileting - Clothing Manipulation and hygiene: with supervision;sit to/from stand Pt Will Perform Tub/Shower Transfer: with supervision;ambulating;Shower transfer;3 in Actuary  OT Frequency: Min 2X/week   Barriers to D/C:            Co-evaluation              AM-PAC PT "6 Clicks" Daily Activity     Outcome Measure Help from another person eating meals?: None Help from another person taking care of personal grooming?: A Little Help from another person toileting, which includes using toliet, bedpan, or urinal?: A Little Help from another person  bathing (including washing, rinsing, drying)?: A Lot Help from another person to put on and taking off regular upper body clothing?: A Little Help from another person to put on and taking off regular lower body clothing?: A Lot 6 Click Score: 17   End of Session Equipment Utilized During Treatment: Rolling walker;Left knee immobilizer CPM Left Knee CPM Left Knee: Off   Activity Tolerance: Patient tolerated treatment well Patient left: in chair;with call bell/phone within reach  OT Visit Diagnosis: Unsteadiness on feet (R26.81);Other abnormalities of gait and mobility (R26.89);Pain Pain - Right/Left: Left Pain - part of body: Knee                Time: 1610-9604 OT Time Calculation (min): 13 min Charges:  OT General Charges $OT Visit: 1 Visit OT Evaluation $OT Eval Moderate Complexity: 1 Mod G-Codes:     Anamae Rochelle A. Brett Albino, M.S., OTR/L Pager: [phone]  Gaye Alken 02/15/2017, 9:38 AM

## 2017-02-15 NOTE — Progress Notes (Signed)
   Subjective: 1 Day Post-Op Procedure(s) (LRB): Removal Left Tibia Nail, Cemented Total Knee Arthroplasty (Left) Patient reports pain as moderate.    Objective: Vital signs in last 24 hours: Temp:  [97.4 F (36.3 C)-100.5 F (38.1 C)] 99 F (37.2 C) (09/15 0530) Pulse Rate:  [49-81] 81 (09/15 0426) Resp:  [12-22] 16 (09/15 0426) BP: (108-157)/(43-91) 126/64 (09/15 0426) SpO2:  [94 %-100 %] 96 % (09/15 0426)  Intake/Output from previous day: 09/14 0701 - 09/15 0700 In: 4025.8 [P.O.:720; I.V.:3305.8] Out: 1100 [Urine:1050; Blood:50] Intake/Output this shift: No intake/output data recorded.   Recent Labs  02/15/17 0239  HGB 10.2*    Recent Labs  02/15/17 0239  WBC 6.4  RBC 3.25*  HCT 31.1*  PLT 163    Recent Labs  02/14/17 0828 02/15/17 0239  NA 140 137  K 3.7 4.0  CL 109 106  CO2 26 26  BUN 11 12  CREATININE 1.25* 1.33*  GLUCOSE 99 134*  CALCIUM 9.3 8.1*    Recent Labs  02/14/17 0828  INR 1.03    Neurologically intact Dg Knee 1-2 Views Left  Result Date: 02/14/2017 CLINICAL DATA:  Removal of tibial hardware. EXAM: LEFT KNEE - 1-2 VIEW; DG C-ARM 61-120 MIN COMPARISON:  None. FINDINGS: Single intraoperative spot image demonstrates an intramedullary rod in the tibia and proximal interlocking screw with screwdriver attached. IMPRESSION: Tibial hardware as above. Electronically Signed   By: Rudie Meyer M.D.   On: 02/14/2017 11:55   Dg Knee Left Port  Result Date: 02/14/2017 CLINICAL DATA:  Postop left knee replacement EXAM: PORTABLE LEFT KNEE - 1-2 VIEW COMPARISON:  Intraoperative exam from 02/14/2017. FINDINGS: There is a new total knee arthroplasty with patellar resurfacing. Postop intra-articular is emphysema noted with overlying skin staples. No immediate postop complication or fracture. Pre-existing intramedullary nail has been removed from the tibia. IMPRESSION: New total knee arthroplasty with postop change. Removal of intramedullary nail from the  tibia since the comparison exam. Electronically Signed   By: Tollie Eth M.D.   On: 02/14/2017 15:05   Dg C-arm 1-60 Min  Result Date: 02/14/2017 CLINICAL DATA:  Removal of tibial hardware. EXAM: LEFT KNEE - 1-2 VIEW; DG C-ARM 61-120 MIN COMPARISON:  None. FINDINGS: Single intraoperative spot image demonstrates an intramedullary rod in the tibia and proximal interlocking screw with screwdriver attached. IMPRESSION: Tibial hardware as above. Electronically Signed   By: Rudie Meyer M.D.   On: 02/14/2017 11:55    Assessment/Plan: 1 Day Post-Op Procedure(s) (LRB): Removal Left Tibia Nail, Cemented Total Knee Arthroplasty (Left) Up with therapy.  Dressing dry now after change last PM. Will leave on until tomorrow. Continue therapy. Hgb 10.2  Eldred Manges 02/15/2017, 8:32 AM

## 2017-02-16 LAB — CBC
HEMATOCRIT: 27.7 % — AB (ref 39.0–52.0)
Hemoglobin: 9.3 g/dL — ABNORMAL LOW (ref 13.0–17.0)
MCH: 31.4 pg (ref 26.0–34.0)
MCHC: 33.6 g/dL (ref 30.0–36.0)
MCV: 93.6 fL (ref 78.0–100.0)
PLATELETS: 140 10*3/uL — AB (ref 150–400)
RBC: 2.96 MIL/uL — ABNORMAL LOW (ref 4.22–5.81)
RDW: 12.2 % (ref 11.5–15.5)
WBC: 8.6 10*3/uL (ref 4.0–10.5)

## 2017-02-16 NOTE — Clinical Social Work Note (Signed)
Clinical Social Worker received referral for possible ST-SNF placement.  Chart reviewed.  PT/OT recommending home with home health.  Spoke with RN Case Manager who will follow up with patient to discuss home health needs.    CSW signing off - please re consult if social work needs arise.  Jesse Abisai Deer, LCSW [phone] 

## 2017-02-16 NOTE — Progress Notes (Signed)
Physical Therapy Treatment Patient Details Name: Terry Sloan MRN: [id number] DOB: Jan 20, 1941 Today's Date: 02/16/2017    History of Present Illness Pt is a 76 y.o. male s/p Removal L tibia nail and Cemented L TKA. PMHx: Transient cerebral ischemia, Aphasia, Anxiety, Depression, PVD, Hyperlipidemia, Arthritis, GERD, Macular degeneration, TIA.    PT Comments    Pt doing much better this PM. He was in the recliner in his bone foam on arrival. LLE in 0 degree extension and pt reporting no pain. Pt able to ambulate 100 feet with RW. Agreeable to CPM upon return to bed. CPM flexion increased from 45 to 55 degrees.  Possible d/c home tomorrow, pending mobility progress and pain management.    Follow Up Recommendations  DC plan and follow up therapy as arranged by surgeon;Supervision/Assistance - 24 hour     Equipment Recommendations  Rolling walker with 5" wheels;3in1 (PT)    Recommendations for Other Services       Precautions / Restrictions Precautions Precautions: Fall;Knee Precaution Comments: Reviewed no pillow under knee. Restrictions LLE Weight Bearing: Weight bearing as tolerated Other Position/Activity Restrictions: Per Dr. Ophelia Charter, ok to remove KI for mobility.    Mobility  Bed Mobility           Sit to supine: Min assist   General bed mobility comments: assist for LLE into bed  Transfers   Equipment used: Rolling walker (2 wheeled)   Sit to Stand: Min assist         General transfer comment: assist to power up. Increased time to attain full upright stance.  Ambulation/Gait Ambulation/Gait assistance: Min guard Ambulation Distance (Feet): 100 Feet Assistive device: Rolling walker (2 wheeled) Gait Pattern/deviations: Step-through pattern;Decreased stride length;Antalgic;Decreased weight shift to left;Decreased stance time - left Gait velocity: decreased Gait velocity interpretation: Below normal speed for age/gender General Gait Details: verbal cues for RW  management, sequencing and posture.   Stairs            Wheelchair Mobility    Modified Rankin (Stroke Patients Only)       Balance                                            Cognition Arousal/Alertness: Awake/alert Behavior During Therapy: WFL for tasks assessed/performed Overall Cognitive Status: Within Functional Limits for tasks assessed                                        Exercises Total Joint Exercises Ankle Circles/Pumps: AROM;Both;10 reps Goniometric ROM: 0-50 L knee    General Comments        Pertinent Vitals/Pain Pain Assessment: 0-10 Pain Score: 0-No pain    Home Living                      Prior Function            PT Goals (current goals can now be found in the care plan section) Acute Rehab PT Goals Patient Stated Goal: decrease pain and get better PT Goal Formulation: With patient Time For Goal Achievement: 02/22/17 Potential to Achieve Goals: Good Progress towards PT goals: Progressing toward goals    Frequency    7X/week      PT Plan Current plan remains appropriate  Co-evaluation              AM-PAC PT "6 Clicks" Daily Activity  Outcome Measure  Difficulty turning over in bed (including adjusting bedclothes, sheets and blankets)?: A Little Difficulty moving from lying on back to sitting on the side of the bed? : A Lot Difficulty sitting down on and standing up from a chair with arms (e.g., wheelchair, bedside commode, etc,.)?: Unable Help needed moving to and from a bed to chair (including a wheelchair)?: A Little Help needed walking in hospital room?: A Little Help needed climbing 3-5 steps with a railing? : A Lot 6 Click Score: 14    End of Session Equipment Utilized During Treatment: Gait belt Activity Tolerance: Patient tolerated treatment well Patient left: in bed;with call bell/phone within reach;in CPM   PT Visit Diagnosis: Difficulty in walking, not  elsewhere classified (R26.2);Pain Pain - Right/Left: Left Pain - part of body: Knee     Time: 1610-9604 PT Time Calculation (min) (ACUTE ONLY): 25 min  Charges:  $Gait Training: 8-22 mins $Therapeutic Activity: 8-22 mins                    G Codes:       Aida Raider, PT  Office # [phone] Pager [phone]    Ilda Foil 02/16/2017, 3:39 PM

## 2017-02-16 NOTE — Progress Notes (Signed)
Occupational Therapy Treatment Patient Details Name: Terry Sloan MRN: [id number] DOB: 02-14-1941 Today's Date: 02/16/2017    History of present illness Pt is a 76 y.o. male s/p Removal L tibia nail and Cemented L TKA. PMHx: Transient cerebral ischemia, Aphasia, Anxiety, Depression, PVD, Hyperlipidemia, Arthritis, GERD, Macular degeneration, TIA.   OT comments  Pts progress toward OT goals limited by pain. Pt required min-mod assist for functional mobility; increased assist required with pain and fatigue. Pt requires max assist for LB ADL at this time. Feel pt would benefit from [facility] for follow up, potentially SNF level therapy pending progress. Will continue to follow acutely.   Follow Up Recommendations  DC plan and follow up therapy as arranged by surgeon;Supervision/Assistance - 24 hour    Equipment Recommendations  3 in 1 bedside commode    Recommendations for Other Services      Precautions / Restrictions Precautions Precautions: Fall;Knee Required Braces or Orthoses: Knee Immobilizer - Left Restrictions Weight Bearing Restrictions: Yes LLE Weight Bearing: Weight bearing as tolerated       Mobility Bed Mobility Overal bed mobility: Needs Assistance Bed Mobility: Supine to Sit     Supine to sit: Mod assist     General bed mobility comments: Assist for LLE to EOB and HHA for trunk elevation to sitting. Cues for hand placement and technique  Transfers Overall transfer level: Needs assistance Equipment used: Rolling walker (2 wheeled) Transfers: Sit to/from [employer] Sit to Stand: Min assist Stand pivot transfers: Mod assist       General transfer comment: Cues for hand placement sequencing and safety. Min assist to boost up from EOB x3, mod assist for stand pivot following mobility    Balance Overall balance assessment: Needs assistance Sitting-balance support: Feet supported;Single extremity supported Sitting balance-Leahy Scale: Fair      Standing balance support: Bilateral upper extremity supported Standing balance-Leahy Scale: Poor                             ADL either performed or assessed with clinical judgement   ADL Overall ADL's : Needs assistance/impaired                 Upper Body Dressing : Minimal assistance;Sitting Upper Body Dressing Details (indicate cue type and reason): to doff/don gowns Lower Body Dressing: Maximal assistance;Sit to/from stand Lower Body Dressing Details (indicate cue type and reason): to don sock and for management of underwear Toilet Transfer: Moderate assistance;Stand-pivot;BSC;RW Toilet Transfer Details (indicate cue type and reason): following mobility; pt requesting to transfer to [facility] for attempted BM; required mod assist for stand pivot transfer chair>BSC         Functional mobility during ADLs: Minimal assistance;Rolling walker       Vision       Perception     Praxis      Cognition Arousal/Alertness: Awake/alert Behavior During Therapy: WFL for tasks assessed/performed Overall Cognitive Status: Within Functional Limits for tasks assessed                                          Exercises     Shoulder Instructions       General Comments      Pertinent Vitals/ Pain       Pain Assessment: Faces Faces Pain Scale: Hurts whole lot Pain Location: L knee  with mobility Pain Descriptors / Indicators: Aching;Grimacing;Guarding Pain Intervention(s): Monitored during session;Limited activity within patient's tolerance;Repositioned  Home Living                                          Prior Functioning/Environment              Frequency  Min 2X/week        Progress Toward Goals  OT Goals(current goals can now be found in the care plan section)  Progress towards OT goals: Not progressing toward goals - comment (limited by pain)  Acute Rehab OT Goals Patient Stated Goal: decrease pain and get  better OT Goal Formulation: With patient  Plan Discharge plan remains appropriate    Co-evaluation                 AM-PAC PT "6 Clicks" Daily Activity     Outcome Measure   Help from another person eating meals?: None Help from another person taking care of personal grooming?: A Little Help from another person toileting, which includes using toliet, bedpan, or urinal?: A Lot Help from another person bathing (including washing, rinsing, drying)?: A Lot Help from another person to put on and taking off regular upper body clothing?: A Lot Help from another person to put on and taking off regular lower body clothing?: A Lot 6 Click Score: 15    End of Session Equipment Utilized During Treatment: Gait belt;Rolling walker;Left knee immobilizer  OT Visit Diagnosis: Unsteadiness on feet (R26.81);Other abnormalities of gait and mobility (R26.89);Pain Pain - Right/Left: Left Pain - part of body: Knee   Activity Tolerance Patient limited by pain   Patient Left with call bell/phone within reach;Other (comment) (on [facility])   Nurse Communication Other (comment) (RN tech-pt on [facility] with call bell)        Time: 1610-9604 OT Time Calculation (min): 27 min  Charges: OT General Charges $OT Visit: 1 Visit OT Treatments $Self Care/Home Management : 23-37 mins  Nadia Viar A. Brett Albino, M.S., OTR/L Pager: [phone]   Gaye Alken 02/16/2017, 9:07 AM

## 2017-02-16 NOTE — Progress Notes (Signed)
   Subjective: 2 Days Post-Op Procedure(s) (LRB): Removal Left Tibia Nail, Cemented Total Knee Arthroplasty (Left) Patient reports pain as moderate.    Objective: Vital signs in last 24 hours: Temp:  [99.5 F (37.5 C)-100.8 F (38.2 C)] 100.8 F (38.2 C) (09/16 0300) Pulse Rate:  [82-91] 91 (09/16 0300) Resp:  [16] 16 (09/16 0300) BP: (116-142)/(60-81) 142/81 (09/16 0300) SpO2:  [96 %-98 %] 98 % (09/16 0300)  Intake/Output from previous day: 09/15 0701 - 09/16 0700 In: 360 [P.O.:360] Out: 1000 [Urine:1000] Intake/Output this shift: Total I/O In: 120 [P.O.:120] Out: 200 [Urine:200]   Recent Labs  02/15/17 0239 02/16/17 0307  HGB 10.2* 9.3*    Recent Labs  02/15/17 0239 02/16/17 0307  WBC 6.4 8.6  RBC 3.25* 2.96*  HCT 31.1* 27.7*  PLT 163 140*    Recent Labs  02/14/17 0828 02/15/17 0239  NA 140 137  K 3.7 4.0  CL 109 106  CO2 26 26  BUN 11 12  CREATININE 1.25* 1.33*  GLUCOSE 99 134*  CALCIUM 9.3 8.1*    Recent Labs  02/14/17 0828  INR 1.03    Neurologically intact No results found.  Assessment/Plan: 2 Days Post-Op Procedure(s) (LRB): Removal Left Tibia Nail, Cemented Total Knee Arthroplasty (Left) Up with therapy. Just made it to door. Bone foam ordered to help with extension. Possible home Monday afternoon. Dressing change  Eldred Manges 02/16/2017, 9:42 AM

## 2017-02-16 NOTE — Progress Notes (Signed)
Orthopedic Tech Progress Note Patient Details:  Terry Sloan 31-Oct-1940 [id number]  Ortho Devices Type of Ortho Device:  (footsie roll) Ortho Device/Splint Location: applied cpm to pt left knee. pt tolerated application very well.  left knee. Ortho Device/Splint Interventions: Freeman Caldron, Haneefah Venturini 02/16/2017, 9:54 AM

## 2017-02-16 NOTE — Progress Notes (Signed)
Orthopedic Tech Progress Note Patient Details:  Terry Sloan [birth year] [id number]  Patient ID: Terry Sloan, male   DOB: 06-May-1941, 76 y.o.   MRN: [id number]   Nikki Dom 02/16/2017, 9:54 AM Viewed order from doctor's order list

## 2017-02-16 NOTE — Progress Notes (Signed)
Physical Therapy Treatment Patient Details Name: Terry Sloan MRN: [id number] DOB: 15-Feb-1941 Today's Date: 02/16/2017    History of Present Illness Pt is a 76 y.o. male s/p Removal L tibia nail and Cemented L TKA. PMHx: Transient cerebral ischemia, Aphasia, Anxiety, Depression, PVD, Hyperlipidemia, Arthritis, GERD, Macular degeneration, TIA.    PT Comments    Mobility continues to be limited by pain. Difficulty with ROM/exercises L knee due to pain and stiffness. MD ordered bone foam to assist with knee extension. Pt able to ambulate 35 feet with RW this session.  PT to continue per POC. D/C plan is for home with HHPT/OT and 24-hour family assist.   Follow Up Recommendations  DC plan and follow up therapy as arranged by surgeon;Supervision/Assistance - 24 hour     Equipment Recommendations  Rolling walker with 5" wheels;3in1 (PT)    Recommendations for Other Services       Precautions / Restrictions Precautions Precautions: Fall;Knee Required Braces or Orthoses: Knee Immobilizer - Left Knee Immobilizer - Left: On except when in CPM;On at all times (Per Dr Ophelia Charter, ok to remove KI for mobility. ) Restrictions Weight Bearing Restrictions: Yes LLE Weight Bearing: Weight bearing as tolerated    Mobility  Bed Mobility Overal bed mobility: Needs Assistance Bed Mobility: Supine to Sit     Supine to sit: Mod assist     General bed mobility comments: Pt received in recliner.  Transfers Overall transfer level: Needs assistance Equipment used: Rolling walker (2 wheeled) Transfers: Sit to/from [employer] Sit to Stand: Min assist Stand pivot transfers: Mod assist       General transfer comment: Increased time to complete. Verbal cues for posture with initial stance.  Ambulation/Gait Ambulation/Gait assistance: Min assist Ambulation Distance (Feet): 35 Feet Assistive device: Rolling walker (2 wheeled) Gait Pattern/deviations: Step-through pattern;Decreased  stride length;Trunk flexed;Decreased stance time - left;Decreased weight shift to left Gait velocity: decreased   General Gait Details: distance limited by pain, verbal cues for sequencing and posture. Pt reporting he felt like he would pass out upon return to recliner. Vitals taken. BP 126/60 HR 92. Pt reports feeling better after 3-minute recovery.    Stairs            Wheelchair Mobility    Modified Rankin (Stroke Patients Only)       Balance Overall balance assessment: Needs assistance Sitting-balance support: Feet supported;Single extremity supported Sitting balance-Leahy Scale: Fair     Standing balance support: Bilateral upper extremity supported Standing balance-Leahy Scale: Poor                              Cognition Arousal/Alertness: Awake/alert Behavior During Therapy: WFL for tasks assessed/performed Overall Cognitive Status: Within Functional Limits for tasks assessed                                        Exercises Total Joint Exercises Ankle Circles/Pumps: AROM;Both;20 reps Quad Sets: AROM;Left;10 reps Heel Slides: AROM;PROM;Left;10 reps Hip ABduction/ADduction: AROM;Left;10 reps Goniometric ROM: 10-50 L knee    General Comments        Pertinent Vitals/Pain Pain Assessment: 0-10 Pain Score: 5  (Pt only reporting 5/10 but pt appears symptomatic of higher level pain.) Faces Pain Scale: Hurts whole lot Pain Location: L knee with mobility Pain Descriptors / Indicators: Grimacing;Guarding;Moaning;Aching;Sharp Pain Intervention(s): Monitored during session;Limited activity  within patient's tolerance;Repositioned;Ice applied    Home Living                      Prior Function            PT Goals (current goals can now be found in the care plan section) Acute Rehab PT Goals Patient Stated Goal: decrease pain and get better PT Goal Formulation: With patient Time For Goal Achievement: 02/22/17 Potential to  Achieve Goals: Good Progress towards PT goals: Progressing toward goals    Frequency    7X/week      PT Plan Current plan remains appropriate    Co-evaluation              AM-PAC PT "6 Clicks" Daily Activity  Outcome Measure  Difficulty turning over in bed (including adjusting bedclothes, sheets and blankets)?: A Little Difficulty moving from lying on back to sitting on the side of the bed? : Unable Difficulty sitting down on and standing up from a chair with arms (e.g., wheelchair, bedside commode, etc,.)?: Unable Help needed moving to and from a bed to chair (including a wheelchair)?: A Little Help needed walking in hospital room?: A Little Help needed climbing 3-5 steps with a railing? : A Lot 6 Click Score: 13    End of Session Equipment Utilized During Treatment: Gait belt Activity Tolerance: Patient limited by pain Patient left: in chair;with call bell/phone within reach Nurse Communication: Mobility status PT Visit Diagnosis: Difficulty in walking, not elsewhere classified (R26.2);Pain Pain - Right/Left: Left Pain - part of body: Knee     Time: 4098-1191 PT Time Calculation (min) (ACUTE ONLY): 26 min  Charges:  $Gait Training: 8-22 mins $Therapeutic Exercise: 8-22 mins                    G Codes:       Aida Raider, PT  Office # [phone] Pager [phone]    Ilda Foil 02/16/2017, 10:05 AM

## 2017-02-17 ENCOUNTER — Encounter (HOSPITAL_COMMUNITY): Payer: Self-pay | Admitting: Orthopaedic Surgery

## 2017-02-17 ENCOUNTER — Telehealth (INDEPENDENT_AMBULATORY_CARE_PROVIDER_SITE_OTHER): Payer: Self-pay | Admitting: Orthopaedic Surgery

## 2017-02-17 LAB — CBC
HCT: 25.5 % — ABNORMAL LOW (ref 39.0–52.0)
Hemoglobin: 8.7 g/dL — ABNORMAL LOW (ref 13.0–17.0)
MCH: 31.6 pg (ref 26.0–34.0)
MCHC: 34.1 g/dL (ref 30.0–36.0)
MCV: 92.7 fL (ref 78.0–100.0)
Platelets: 134 10*3/uL — ABNORMAL LOW (ref 150–400)
RBC: 2.75 MIL/uL — ABNORMAL LOW (ref 4.22–5.81)
RDW: 12 % (ref 11.5–15.5)
WBC: 8.4 10*3/uL (ref 4.0–10.5)

## 2017-02-17 MED ORDER — ASPIRIN 325 MG PO TABS: 325.0000 mg | ORAL_TABLET | Freq: Every day | ORAL | Status: DC

## 2017-02-17 MED ORDER — OXYCODONE-ACETAMINOPHEN 5-325 MG PO TABS: 1.0000 | ORAL_TABLET | ORAL | 0 refills | Status: DC | PRN

## 2017-02-17 MED ORDER — METHOCARBAMOL 500 MG PO TABS: 500.0000 mg | ORAL_TABLET | Freq: Four times a day (QID) | ORAL | 0 refills | Status: DC | PRN

## 2017-02-17 NOTE — Progress Notes (Signed)
   Subjective: 3 Days Post-Op Procedure(s) (LRB): Removal Left Tibia Nail, Cemented Total Knee Arthroplasty (Left) Patient reports pain as mild and moderate.    Objective: Vital signs in last 24 hours: Temp:  [97.8 F (36.6 C)-99.7 F (37.6 C)] 98.5 F (36.9 C) (09/17 0429) Pulse Rate:  [81-92] 81 (09/17 0429) Resp:  [16-18] 18 (09/17 0429) BP: (108-132)/(62-73) 132/68 (09/17 0429) SpO2:  [96 %-99 %] 98 % (09/17 0429)  Intake/Output from previous day: 09/16 0701 - 09/17 0700 In: 240 [P.O.:240] Out: 1100 [Urine:1100] Intake/Output this shift: No intake/output data recorded.   Recent Labs  02/15/17 0239 02/16/17 0307 02/17/17 0224  HGB 10.2* 9.3* 8.7*    Recent Labs  02/16/17 0307 02/17/17 0224  WBC 8.6 8.4  RBC 2.96* 2.75*  HCT 27.7* 25.5*  PLT 140* 134*    Recent Labs  02/15/17 0239  NA 137  K 4.0  CL 106  CO2 26  BUN 12  CREATININE 1.33*  GLUCOSE 134*  CALCIUM 8.1*   No results for input(s): LABPT, INR in the last 72 hours.  Neurologically intact No results found.  Assessment/Plan: 3 Days Post-Op Procedure(s) (LRB): Removal Left Tibia Nail, Cemented Total Knee Arthroplasty (Left) Up with therapy,  D/C home with HHPT. Office one week.   Eldred Manges 02/17/2017, 9:18 AM

## 2017-02-17 NOTE — Telephone Encounter (Signed)
Pt daughter called about appt for her father and needs a date open in the schedule

## 2017-02-17 NOTE — Progress Notes (Signed)
Pt ready for discharge. Education/instructions reviewed with pt and family, and all questions/concerns addressed. IV removed, prescriptions given, and belongings gathered. Pt will be transported out via wheelchair to daughter's vehicle. Will continue to monitor

## 2017-02-17 NOTE — Progress Notes (Signed)
Physical Therapy Treatment Patient Details Name: Terry Sloan MRN: [id number] DOB: 02-14-1941 Today's Date: 02/17/2017    History of Present Illness Pt is a 76 y.o. male s/p Removal L tibia nail and Cemented L TKA. PMHx: Transient cerebral ischemia, Aphasia, Anxiety, Depression, PVD, Hyperlipidemia, Arthritis, GERD, Macular degeneration, TIA.    PT Comments    Pt limited in mobility this am due to pain and fatigue.  Pt will require assistance at home at all times as he does not use his RW in a safe manner.  Pt only able to ambulate 100 ft before requiring WC transport back to room.  Pt reports he is tired and in too much pain.  Pt required multiple rest breaks during gait training and refused further treatment after fatigue from gait training.     Follow Up Recommendations  DC plan and follow up therapy as arranged by surgeon;Supervision/Assistance - 24 hour     Equipment Recommendations  Rolling walker with 5" wheels;3in1 (PT)    Recommendations for Other Services       Precautions / Restrictions Precautions Precautions: Fall;Knee Precaution Comments: Reviewed no pillow under knee. Required Braces or Orthoses: Knee Immobilizer - Left Knee Immobilizer - Left: On except when in CPM;On at all times Restrictions Weight Bearing Restrictions: Yes LLE Weight Bearing: Weight bearing as tolerated Other Position/Activity Restrictions: Per Dr. Ophelia Charter, ok to remove KI for mobility.    Mobility  Bed Mobility Overal bed mobility: Needs Assistance Bed Mobility: Supine to Sit     Supine to sit: Min guard;HOB elevated     General bed mobility comments: Pt received in recliner on arrival.    Transfers Overall transfer level: Needs assistance Equipment used: Rolling walker (2 wheeled) Transfers: Sit to/from Stand Sit to Stand: Min assist         General transfer comment: Assist to boost into standing.  Pt with slow guarded technique and required cues for forward weight shifting and  hand placement.    Ambulation/Gait Ambulation/Gait assistance: Min guard Ambulation Distance (Feet): 100 Feet Assistive device: Rolling walker (2 wheeled) Gait Pattern/deviations: Step-to pattern;Shuffle;Trunk flexed;Decreased stance time - left;Decreased step length - right Gait velocity: decreased   General Gait Details: Pt required cues throughout for safety as he kept resting elbows on his walker.  PTA educated this was not safe and encouraged patient to keep hands on the hand grips as it is intended for safe use.  Pt required cues for upper trunk control and increasing stride length.     Stairs Stairs:  (Pt refused curb training and he reports he has help at home.  )          Wheelchair Mobility    Modified Rankin (Stroke Patients Only)       Balance Overall balance assessment: Needs assistance Sitting-balance support: Feet supported;Single extremity supported Sitting balance-Leahy Scale: Fair     Standing balance support: Bilateral upper extremity supported;During functional activity Standing balance-Leahy Scale: Poor Standing balance comment: Pt reliant on UE support during mobility; able to maintain static standing to wash hands at sink with close guard for safety during task                             Cognition Arousal/Alertness: Awake/alert Behavior During Therapy: [facility] for tasks assessed/performed Overall Cognitive Status: No family/caregiver present to determine baseline cognitive functioning  General Comments: noted decreased STM during session       Exercises Total Joint Exercises Ankle Circles/Pumps:  (Pt refused therapeutic exercise.  ) Goniometric ROM: grossly 40 degrees of flexion.     General Comments        Pertinent Vitals/Pain Pain Assessment: 0-10 Pain Score: 10-Worst pain ever Faces Pain Scale: Hurts little more Pain Location: L knee with mobility Pain Descriptors / Indicators:  Grimacing;Guarding;Moaning;Aching;Sharp Pain Intervention(s): Monitored during session;Repositioned;Ice applied    Home Living                      Prior Function            PT Goals (current goals can now be found in the care plan section) Acute Rehab PT Goals Patient Stated Goal: To go home and get rest Potential to Achieve Goals: Good Progress towards PT goals: Progressing toward goals    Frequency    7X/week      PT Plan Current plan remains appropriate    Co-evaluation              AM-PAC PT "6 Clicks" Daily Activity  Outcome Measure  Difficulty turning over in bed (including adjusting bedclothes, sheets and blankets)?: A Little Difficulty moving from lying on back to sitting on the side of the bed? : A Lot Difficulty sitting down on and standing up from a chair with arms (e.g., wheelchair, bedside commode, etc,.)?: Unable Help needed moving to and from a bed to chair (including a wheelchair)?: A Little Help needed walking in hospital room?: A Little Help needed climbing 3-5 steps with a railing? : A Lot 6 Click Score: 14    End of Session Equipment Utilized During Treatment: Gait belt Activity Tolerance: Patient limited by pain;Patient limited by fatigue Patient left: in chair;with call bell/phone within reach Nurse Communication: Mobility status PT Visit Diagnosis: Difficulty in walking, not elsewhere classified (R26.2);Pain Pain - Right/Left: Left Pain - part of body: Knee     Time: 4696-2952 PT Time Calculation (min) (ACUTE ONLY): 17 min  Charges:  $Gait Training: 8-22 mins                    G Codes:       Joycelyn Rua, PTA pager [phone]    Florestine Avers 02/17/2017, 11:00 AM

## 2017-02-17 NOTE — Discharge Instructions (Signed)
Ok to shower. Work on leg lifts and bending knee with ankles crossed daily. See Dr. Ophelia Charter in 2 wks. Take one aspirin daily for 4 wks to help prevent blood clots.  Use ice on and off for pain and swelling. Work on knee straightening with Bone foam heel block.

## 2017-02-17 NOTE — Progress Notes (Signed)
Occupational Therapy Treatment Patient Details Name: Terry Sloan MRN: [id number] DOB: 1940/07/12 Today's Date: 02/17/2017    History of present illness Pt is a 76 y.o. male s/p Removal L tibia nail and Cemented L TKA. PMHx: Transient cerebral ischemia, Aphasia, Anxiety, Depression, PVD, Hyperlipidemia, Arthritis, GERD, Macular degeneration, TIA.   OT comments  Pt progressing towards goals, completed room level functional mobility, toileting, and standing grooming ADLs with MinGuard-MinA throughout at RW. Pt completed simulated walk-in shower transfer with MinA and verbal cues for sequencing. Pt continues to require increased assist for LB ADLs secondary to LLE limitations, with Pt reporting spouse will be able to assist PRN after initial return home. Based upon Pt progress feel Pt will benefit from continued HHOT services after discharge home. Will continue to follow acutely.    Follow Up Recommendations  DC plan and follow up therapy as arranged by surgeon;Supervision/Assistance - 24 hour    Equipment Recommendations  3 in 1 bedside commode          Precautions / Restrictions Precautions Precautions: Fall;Knee Required Braces or Orthoses: Knee Immobilizer - Left Knee Immobilizer - Left: On except when in CPM;On at all times Restrictions Weight Bearing Restrictions: Yes LLE Weight Bearing: Weight bearing as tolerated Other Position/Activity Restrictions: Per Dr. Ophelia Charter, ok to remove KI for mobility.       Mobility Bed Mobility Overal bed mobility: Needs Assistance Bed Mobility: Supine to Sit     Supine to sit: Min guard;HOB elevated     General bed mobility comments: close guard for safety; HOB elevated, increased time   Transfers Overall transfer level: Needs assistance Equipment used: Rolling walker (2 wheeled) Transfers: Sit to/from Stand Sit to Stand: Min guard;Min assist         General transfer comment: assist to control descent during sit<>stand; verbal cues  for proper hand placement     Balance Overall balance assessment: Needs assistance Sitting-balance support: Feet supported;Single extremity supported Sitting balance-Leahy Scale: Fair     Standing balance support: Bilateral upper extremity supported;During functional activity Standing balance-Leahy Scale: Fair Standing balance comment: Pt reliant on UE support during mobility; able to maintain static standing to wash hands at sink with close guard for safety during task                            ADL either performed or assessed with clinical judgement   ADL Overall ADL's : Needs assistance/impaired Eating/Feeding: Set up;Sitting   Grooming: Wash/dry hands;Min guard;Standing Grooming Details (indicate cue type and reason): close minGuard for safety                Lower Body Dressing Details (indicate cue type and reason): verbally reviewed compensatory techniques for completing LB dressing; Pt reports spouse can assist initially after return home  Toilet Transfer: Minimal assistance;Ambulation;BSC;RW Toilet Transfer Details (indicate cue type and reason): BSC over toilet; verbal cues for hand placement  Toileting- Clothing Manipulation and Hygiene: Minimal assistance;Sit to/from stand Toileting - Clothing Manipulation Details (indicate cue type and reason): Min steady assist while Pt completes clothing management  Tub/ Shower Transfer: Walk-in shower;Minimal assistance;Ambulation;3 in 1;Rolling walker;Cueing for sequencing Tub/Shower Transfer Details (indicate cue type and reason): Min steady assist and verbal cues for correct sequencing  Functional mobility during ADLs: Minimal assistance;Min guard;Rolling walker                         Cognition Arousal/Alertness:  Awake/alert Behavior During Therapy: WFL for tasks assessed/performed Overall Cognitive Status: No family/caregiver present to determine baseline cognitive functioning                                  General Comments: noted decreased STM during session                           Pertinent Vitals/ Pain       Pain Assessment: Faces Faces Pain Scale: Hurts little more Pain Location: L knee with mobility Pain Descriptors / Indicators: Grimacing;Guarding;Moaning;Aching;Sharp Pain Intervention(s): Monitored during session;Repositioned;Ice applied;Limited activity within patient's tolerance                                                          Frequency  Min 2X/week        Progress Toward Goals  OT Goals(current goals can now be found in the care plan section)  Progress towards OT goals: Progressing toward goals  Acute Rehab OT Goals Patient Stated Goal: decrease pain and get better OT Goal Formulation: With patient Time For Goal Achievement: 03/01/17 Potential to Achieve Goals: Good  Plan                       AM-PAC PT "6 Clicks" Daily Activity     Outcome Measure   Help from another person eating meals?: None Help from another person taking care of personal grooming?: A Little Help from another person toileting, which includes using toliet, bedpan, or urinal?: A Little Help from another person bathing (including washing, rinsing, drying)?: A Lot Help from another person to put on and taking off regular upper body clothing?: A Little Help from another person to put on and taking off regular lower body clothing?: A Lot 6 Click Score: 17    End of Session Equipment Utilized During Treatment: Gait belt;Rolling walker;Left knee immobilizer CPM Left Knee CPM Left Knee: Off  OT Visit Diagnosis: Unsteadiness on feet (R26.81);Other abnormalities of gait and mobility (R26.89);Pain Pain - Right/Left: Left Pain - part of body: Knee   Activity Tolerance Patient tolerated treatment well   Patient Left with call bell/phone within reach;in chair   Nurse Communication Mobility status        Time:  4098-1191 OT Time Calculation (min): 38 min  Charges: OT General Charges $OT Visit: 1 Visit OT Treatments $Self Care/Home Management : 23-37 mins  Marcy Siren, OT Pager [phone] 02/17/2017    Orlando Penner 02/17/2017, 9:29 AM

## 2017-02-18 ENCOUNTER — Other Ambulatory Visit: Payer: Self-pay

## 2017-02-18 ENCOUNTER — Telehealth (INDEPENDENT_AMBULATORY_CARE_PROVIDER_SITE_OTHER): Payer: Self-pay

## 2017-02-18 DIAGNOSIS — H3530 Unspecified macular degeneration: Secondary | ICD-10-CM | POA: Diagnosis not present

## 2017-02-18 DIAGNOSIS — I739 Peripheral vascular disease, unspecified: Secondary | ICD-10-CM | POA: Diagnosis not present

## 2017-02-18 DIAGNOSIS — Z87891 Personal history of nicotine dependence: Secondary | ICD-10-CM | POA: Diagnosis not present

## 2017-02-18 DIAGNOSIS — M1991 Primary osteoarthritis, unspecified site: Secondary | ICD-10-CM | POA: Diagnosis not present

## 2017-02-18 DIAGNOSIS — Z96652 Presence of left artificial knee joint: Secondary | ICD-10-CM | POA: Diagnosis not present

## 2017-02-18 DIAGNOSIS — T84197D Other mechanical complication of internal fixation device of bone of left lower leg, subsequent encounter: Secondary | ICD-10-CM | POA: Diagnosis not present

## 2017-02-18 DIAGNOSIS — Z8673 Personal history of transient ischemic attack (TIA), and cerebral infarction without residual deficits: Secondary | ICD-10-CM | POA: Diagnosis not present

## 2017-02-18 DIAGNOSIS — Z471 Aftercare following joint replacement surgery: Secondary | ICD-10-CM | POA: Diagnosis not present

## 2017-02-18 NOTE — Telephone Encounter (Signed)
Patient daughter would like a call back concerning continous motion machine.  CB# is [phone].  Please advise.  Thank you.

## 2017-02-18 NOTE — Patient Outreach (Signed)
[facility]) Care Management  02/18/2017  IORI GIGANTE 1940-12-30 [id number]     Transition of Care Referral  Referral Date: 02/18/17 Referral Source: [facility] Discharge Report Date of Admission: 02/14/17 Diagnosis: left tibia nail, cemented total knee arthroplasty Date of Discharge: 02/17/17 Facility: [facility] Insurance: [facility]    Referral received. No outreach warranted at this time. TOC will be completed by primary care provider office who will refer to [facility] care mgmt if needed.    Plan: RN CM will notify [facility] administrative assistant of case status.    Antionette Fairy, RN,BSN,CCM [facility] Care Management Telephonic Care Management Coordinator Direct Phone: [phone] Toll Free: [phone] Fax: [phone]

## 2017-02-19 DIAGNOSIS — R269 Unspecified abnormalities of gait and mobility: Secondary | ICD-10-CM | POA: Diagnosis not present

## 2017-02-19 NOTE — Telephone Encounter (Signed)
appt made for patient

## 2017-02-19 NOTE — Telephone Encounter (Signed)
I spoke with daughter. She states PT is wondering about CPM and if it was ordered for patient. Per Dr. Ophelia Charter, he does not need it. I advised daughter. She is also concerned that PT does not really know what they are needing to do and was asking her for discharge papers.  I faxed script advising total knee protocol for PT. Also advised patient does not need CPM. Faxed to [facility] attn: Verlon Au, which is his PT.

## 2017-02-20 DIAGNOSIS — Z96652 Presence of left artificial knee joint: Secondary | ICD-10-CM | POA: Diagnosis not present

## 2017-02-20 DIAGNOSIS — Z87891 Personal history of nicotine dependence: Secondary | ICD-10-CM | POA: Diagnosis not present

## 2017-02-20 DIAGNOSIS — Z8673 Personal history of transient ischemic attack (TIA), and cerebral infarction without residual deficits: Secondary | ICD-10-CM | POA: Diagnosis not present

## 2017-02-20 DIAGNOSIS — M1991 Primary osteoarthritis, unspecified site: Secondary | ICD-10-CM | POA: Diagnosis not present

## 2017-02-20 DIAGNOSIS — Z471 Aftercare following joint replacement surgery: Secondary | ICD-10-CM | POA: Diagnosis not present

## 2017-02-20 DIAGNOSIS — I739 Peripheral vascular disease, unspecified: Secondary | ICD-10-CM | POA: Diagnosis not present

## 2017-02-20 DIAGNOSIS — T84197D Other mechanical complication of internal fixation device of bone of left lower leg, subsequent encounter: Secondary | ICD-10-CM | POA: Diagnosis not present

## 2017-02-20 DIAGNOSIS — H3530 Unspecified macular degeneration: Secondary | ICD-10-CM | POA: Diagnosis not present

## 2017-02-21 DIAGNOSIS — T84197D Other mechanical complication of internal fixation device of bone of left lower leg, subsequent encounter: Secondary | ICD-10-CM | POA: Diagnosis not present

## 2017-02-21 DIAGNOSIS — Z8673 Personal history of transient ischemic attack (TIA), and cerebral infarction without residual deficits: Secondary | ICD-10-CM | POA: Diagnosis not present

## 2017-02-21 DIAGNOSIS — H3530 Unspecified macular degeneration: Secondary | ICD-10-CM | POA: Diagnosis not present

## 2017-02-21 DIAGNOSIS — Z96652 Presence of left artificial knee joint: Secondary | ICD-10-CM | POA: Diagnosis not present

## 2017-02-21 DIAGNOSIS — Z87891 Personal history of nicotine dependence: Secondary | ICD-10-CM | POA: Diagnosis not present

## 2017-02-21 DIAGNOSIS — M1991 Primary osteoarthritis, unspecified site: Secondary | ICD-10-CM | POA: Diagnosis not present

## 2017-02-21 DIAGNOSIS — I739 Peripheral vascular disease, unspecified: Secondary | ICD-10-CM | POA: Diagnosis not present

## 2017-02-21 DIAGNOSIS — Z471 Aftercare following joint replacement surgery: Secondary | ICD-10-CM | POA: Diagnosis not present

## 2017-02-24 ENCOUNTER — Telehealth (INDEPENDENT_AMBULATORY_CARE_PROVIDER_SITE_OTHER): Payer: Self-pay | Admitting: Radiology

## 2017-02-24 DIAGNOSIS — H3530 Unspecified macular degeneration: Secondary | ICD-10-CM | POA: Diagnosis not present

## 2017-02-24 DIAGNOSIS — Z8673 Personal history of transient ischemic attack (TIA), and cerebral infarction without residual deficits: Secondary | ICD-10-CM | POA: Diagnosis not present

## 2017-02-24 DIAGNOSIS — Z471 Aftercare following joint replacement surgery: Secondary | ICD-10-CM | POA: Diagnosis not present

## 2017-02-24 DIAGNOSIS — Z96652 Presence of left artificial knee joint: Secondary | ICD-10-CM | POA: Diagnosis not present

## 2017-02-24 DIAGNOSIS — I739 Peripheral vascular disease, unspecified: Secondary | ICD-10-CM | POA: Diagnosis not present

## 2017-02-24 DIAGNOSIS — T84197D Other mechanical complication of internal fixation device of bone of left lower leg, subsequent encounter: Secondary | ICD-10-CM | POA: Diagnosis not present

## 2017-02-24 DIAGNOSIS — M1991 Primary osteoarthritis, unspecified site: Secondary | ICD-10-CM | POA: Diagnosis not present

## 2017-02-24 DIAGNOSIS — Z87891 Personal history of nicotine dependence: Secondary | ICD-10-CM | POA: Diagnosis not present

## 2017-02-24 NOTE — Telephone Encounter (Signed)
I called discussed. Likely impacted. Disimpaction discussed.

## 2017-02-24 NOTE — Telephone Encounter (Signed)
Please advise 

## 2017-02-24 NOTE — Telephone Encounter (Signed)
Patients Daughter in law is calling states that the patient is having a problem with constipation and that they have done what the therapist has advised and it has not helped.  They are wanting to get something that is rx'd and a little stronger than the over the counter.  Please call them back to advise

## 2017-02-26 DIAGNOSIS — Z8673 Personal history of transient ischemic attack (TIA), and cerebral infarction without residual deficits: Secondary | ICD-10-CM | POA: Diagnosis not present

## 2017-02-26 DIAGNOSIS — T84197D Other mechanical complication of internal fixation device of bone of left lower leg, subsequent encounter: Secondary | ICD-10-CM | POA: Diagnosis not present

## 2017-02-26 DIAGNOSIS — Z471 Aftercare following joint replacement surgery: Secondary | ICD-10-CM | POA: Diagnosis not present

## 2017-02-26 DIAGNOSIS — Z96652 Presence of left artificial knee joint: Secondary | ICD-10-CM | POA: Diagnosis not present

## 2017-02-26 DIAGNOSIS — I739 Peripheral vascular disease, unspecified: Secondary | ICD-10-CM | POA: Diagnosis not present

## 2017-02-26 DIAGNOSIS — M1991 Primary osteoarthritis, unspecified site: Secondary | ICD-10-CM | POA: Diagnosis not present

## 2017-02-26 DIAGNOSIS — Z87891 Personal history of nicotine dependence: Secondary | ICD-10-CM | POA: Diagnosis not present

## 2017-02-26 DIAGNOSIS — H3530 Unspecified macular degeneration: Secondary | ICD-10-CM | POA: Diagnosis not present

## 2017-02-27 ENCOUNTER — Ambulatory Visit (INDEPENDENT_AMBULATORY_CARE_PROVIDER_SITE_OTHER): Payer: Medicare HMO | Admitting: Orthopaedic Surgery

## 2017-02-27 ENCOUNTER — Ambulatory Visit (INDEPENDENT_AMBULATORY_CARE_PROVIDER_SITE_OTHER): Payer: Medicare HMO

## 2017-02-27 DIAGNOSIS — Z96652 Presence of left artificial knee joint: Secondary | ICD-10-CM

## 2017-02-27 NOTE — Progress Notes (Signed)
   Post-Op Visit Note   Patient: DAVION FLANNERY           Date of Birth: 02-May-1941           MRN: [id number] Visit Date: 02/27/2017 PCP: Elenora Gamma, MD   Assessment & Plan: Post left total knee arthroplasty. Staples removed incision was good he's had some problems with a heat rash related to the Percocet on his back which is age stump. He's ready to transition to outpatient therapies at 90 flexion. Outpatient therapy prescription given recheck 4 weeks.  Chief Complaint:  Chief Complaint  Patient presents with  . Left Knee - Routine Post Op   Visit Diagnoses:  1. S/P total knee arthroplasty, left     Plan: Recheck 4 weeks. Transition outpatient therapy  Follow-Up Instructions: No Follow-up on file.   Orders:  Orders Placed This Encounter  Procedures  . XR Knee 1-2 Views Left   No orders of the defined types were placed in this encounter.   Imaging: No results found.  PMFS History: Patient Active Problem List   Diagnosis Date Noted  . Arthritis of left knee 02/14/2017  . Unilateral primary osteoarthritis, left knee 02/03/2017  . Knee pain, left 11/03/2016  . Transient cerebral ischemia 10/16/2016  . Aphasia 10/16/2016  . Anxiety and depression 10/16/2016  . Leg pain, diffuse 10/07/2016  . PVD (peripheral vascular disease) (HCC) 09/23/2016  . Mixed hyperlipidemia 09/23/2016  . Weight loss, unintentional 09/23/2016   Past Medical History:  Diagnosis Date  . Arthritis   . GERD (gastroesophageal reflux disease)    [name]  . History of kidney stones   . Hyperlipidemia   . Macular degeneration   . PVD (peripheral vascular disease) (HCC)   . Stroke [facility])    TIA      Family History  Problem Relation Age of Onset  . Stroke Neg Hx     Past Surgical History:  Procedure Laterality Date  . Bone turmor Left    foreheah  . EYE SURGERY Bilateral    cataract removal  . FRACTURE SURGERY Left    leg   pins  . KNEE SURGERY Left   . NECK SURGERY     Tumor  reomoved from head/neck in the 60s  . ROTATOR CUFF REPAIR Right    times 2  . TOTAL KNEE ARTHROPLASTY WITH HARDWARE REMOVAL Left 02/14/2017   Procedure: Removal Left Tibia Nail, Cemented Total Knee Arthroplasty;  Surgeon: Eldred Manges, MD;  Location: [facility] OR;  Service: Orthopedics;  Laterality: Left;   Social History   Occupational History  . Retired    Social History Main Topics  . Smoking status: Former Smoker    Packs/day: 1.50    Years: 20.00    Types: Cigarettes  . Smokeless tobacco: Never Used     Comment: Quit 20+ year ago  . Alcohol use Yes     Comment: Occasional beer  . Drug use: No  . Sexual activity: Not on file

## 2017-02-27 NOTE — Addendum Note (Signed)
Addended by: Rogers Seeds on: 02/27/2017 02:59 PM   Modules accepted: Orders

## 2017-02-28 DIAGNOSIS — Z87891 Personal history of nicotine dependence: Secondary | ICD-10-CM | POA: Diagnosis not present

## 2017-02-28 DIAGNOSIS — Z471 Aftercare following joint replacement surgery: Secondary | ICD-10-CM | POA: Diagnosis not present

## 2017-02-28 DIAGNOSIS — H3530 Unspecified macular degeneration: Secondary | ICD-10-CM | POA: Diagnosis not present

## 2017-02-28 DIAGNOSIS — Z96652 Presence of left artificial knee joint: Secondary | ICD-10-CM | POA: Diagnosis not present

## 2017-02-28 DIAGNOSIS — Z8673 Personal history of transient ischemic attack (TIA), and cerebral infarction without residual deficits: Secondary | ICD-10-CM | POA: Diagnosis not present

## 2017-02-28 DIAGNOSIS — I739 Peripheral vascular disease, unspecified: Secondary | ICD-10-CM | POA: Diagnosis not present

## 2017-02-28 DIAGNOSIS — M1991 Primary osteoarthritis, unspecified site: Secondary | ICD-10-CM | POA: Diagnosis not present

## 2017-02-28 DIAGNOSIS — T84197D Other mechanical complication of internal fixation device of bone of left lower leg, subsequent encounter: Secondary | ICD-10-CM | POA: Diagnosis not present

## 2017-03-05 DIAGNOSIS — H3530 Unspecified macular degeneration: Secondary | ICD-10-CM | POA: Diagnosis not present

## 2017-03-05 DIAGNOSIS — I739 Peripheral vascular disease, unspecified: Secondary | ICD-10-CM | POA: Diagnosis not present

## 2017-03-05 DIAGNOSIS — Z471 Aftercare following joint replacement surgery: Secondary | ICD-10-CM | POA: Diagnosis not present

## 2017-03-05 DIAGNOSIS — Z96652 Presence of left artificial knee joint: Secondary | ICD-10-CM | POA: Diagnosis not present

## 2017-03-05 DIAGNOSIS — T84197D Other mechanical complication of internal fixation device of bone of left lower leg, subsequent encounter: Secondary | ICD-10-CM | POA: Diagnosis not present

## 2017-03-05 DIAGNOSIS — M1991 Primary osteoarthritis, unspecified site: Secondary | ICD-10-CM | POA: Diagnosis not present

## 2017-03-05 DIAGNOSIS — Z8673 Personal history of transient ischemic attack (TIA), and cerebral infarction without residual deficits: Secondary | ICD-10-CM | POA: Diagnosis not present

## 2017-03-05 DIAGNOSIS — Z87891 Personal history of nicotine dependence: Secondary | ICD-10-CM | POA: Diagnosis not present

## 2017-03-07 DIAGNOSIS — M1712 Unilateral primary osteoarthritis, left knee: Secondary | ICD-10-CM | POA: Diagnosis not present

## 2017-03-11 DIAGNOSIS — M1712 Unilateral primary osteoarthritis, left knee: Secondary | ICD-10-CM | POA: Diagnosis not present

## 2017-03-11 NOTE — Discharge Summary (Signed)
Patient ID: Terry Sloan MRN: [id number] DOB/AGE: 1941-03-04 76 y.o.  Admit date: 02/14/2017 Discharge date: 03/11/2017  Admission Diagnoses:  Active Problems:   * No active hospital problems. *   Discharge Diagnoses:  Active Problems:   * No active hospital problems. *  status post Procedure(s): Removal Left Tibia Nail, Cemented Total Knee Arthroplasty  Past Medical History:  Diagnosis Date  . Arthritis   . GERD (gastroesophageal reflux disease)    [name]  . History of kidney stones   . Hyperlipidemia   . Macular degeneration   . PVD (peripheral vascular disease) (HCC)   . Stroke [facility])    TIA      Surgeries: Procedure(s): Removal Left Tibia Nail, Cemented Total Knee Arthroplasty on 02/14/2017   Consultants:   Discharged Condition: Improved  Hospital Course: Terry Sloan is an 76 y.o. male who was admitted 02/14/2017 for operative treatment of left knee arthritis. Patient failed conservative treatments (please see the history and physical for the specifics) and had severe unremitting pain that affects sleep, daily activities and work/hobbies. After pre-op clearance, the patient was taken to the operating room on 02/14/2017 and underwent  Procedure(s): Removal Left Tibia Nail, Cemented Total Knee Arthroplasty.    Patient was given perioperative antibiotics:  Anti-infectives    Start     Dose/Rate Route Frequency Ordered Stop   02/14/17 0805  ceFAZolin (ANCEF) IVPB 2g/100 mL premix     2 g 200 mL/hr over 30 Minutes Intravenous On call to O.R. 02/14/17 0805 02/14/17 1049       Patient was given sequential compression devices and early ambulation to prevent DVT.   Patient benefited maximally from hospital stay and there were no complications. At the time of discharge, the patient was urinating/moving their bowels without difficulty, tolerating a regular diet, pain is controlled with oral pain medications and they have been cleared by PT/OT.   Recent vital signs: No  data found.    Recent laboratory studies: No results for input(s): WBC, HGB, HCT, PLT, NA, K, CL, CO2, BUN, CREATININE, GLUCOSE, INR, CALCIUM in the last 72 hours.  Invalid input(s): PT, 2   Discharge Medications:   Allergies as of 02/17/2017      Reactions   Celebrex [celecoxib] Other (See Comments)   Leg swelling, broke out in rash      Medication List    STOP taking these medications   naproxen sodium 220 MG tablet Commonly known as:  ANAPROX     TAKE these medications   aspirin 325 MG tablet Commonly known as:  BAYER ASPIRIN Take 1 tablet (325 mg total) by mouth daily.   methocarbamol 500 MG tablet Commonly known as:  ROBAXIN Take 1 tablet (500 mg total) by mouth every 6 (six) hours as needed for muscle spasms.   oxyCODONE-acetaminophen 5-325 MG tablet Commonly known as:  ROXICET Take 1-2 tablets by mouth every 4 (four) hours as needed for severe pain.   pravastatin 80 MG tablet Commonly known as:  PRAVACHOL Take 1 tablet (80 mg total) by mouth daily.       Diagnostic Studies: Dg Knee 1-2 Views Left  Result Date: 02/14/2017 CLINICAL DATA:  Removal of tibial hardware. EXAM: LEFT KNEE - 1-2 VIEW; DG C-ARM 61-120 MIN COMPARISON:  None. FINDINGS: Single intraoperative spot image demonstrates an intramedullary rod in the tibia and proximal interlocking screw with screwdriver attached. IMPRESSION: Tibial hardware as above. Electronically Signed   By: Rudie Meyer M.D.   On:  02/14/2017 11:55   Dg Knee Left Port  Result Date: 02/14/2017 CLINICAL DATA:  Postop left knee replacement EXAM: PORTABLE LEFT KNEE - 1-2 VIEW COMPARISON:  Intraoperative exam from 02/14/2017. FINDINGS: There is a new total knee arthroplasty with patellar resurfacing. Postop intra-articular is emphysema noted with overlying skin staples. No immediate postop complication or fracture. Pre-existing intramedullary nail has been removed from the tibia. IMPRESSION: New total knee arthroplasty with postop  change. Removal of intramedullary nail from the tibia since the comparison exam. Electronically Signed   By: Tollie Eth M.D.   On: 02/14/2017 15:05   Dg C-arm 1-60 Min  Result Date: 02/14/2017 CLINICAL DATA:  Removal of tibial hardware. EXAM: LEFT KNEE - 1-2 VIEW; DG C-ARM 61-120 MIN COMPARISON:  None. FINDINGS: Single intraoperative spot image demonstrates an intramedullary rod in the tibia and proximal interlocking screw with screwdriver attached. IMPRESSION: Tibial hardware as above. Electronically Signed   By: Rudie Meyer M.D.   On: 02/14/2017 11:55   Xr Knee 1-2 Views Left  Result Date: 02/27/2017 Two-view x-rays left knee demonstrates total knee arthroplasty cemented in good position and alignment. Impression satisfactory postop left total knee arthroplasty.     Follow-up Information    Eldred Manges, MD Follow up in 1 week(s).   Specialty:  Orthopedic Surgery Contact information: [address] [place] [address] [phone]        [facility] Follow up.   Why:  A representative from [facility] will contact you to arrange start date and time for your therapy. Contact information: [address] [place] [address] [phone]           Discharge Plan:  discharge to home  Disposition:     Signed: Zonia Kief  03/11/2017, 1:16 PM

## 2017-03-14 DIAGNOSIS — M1712 Unilateral primary osteoarthritis, left knee: Secondary | ICD-10-CM | POA: Diagnosis not present

## 2017-03-17 DIAGNOSIS — M1712 Unilateral primary osteoarthritis, left knee: Secondary | ICD-10-CM | POA: Diagnosis not present

## 2017-03-19 DIAGNOSIS — M1712 Unilateral primary osteoarthritis, left knee: Secondary | ICD-10-CM | POA: Diagnosis not present

## 2017-03-20 ENCOUNTER — Ambulatory Visit (INDEPENDENT_AMBULATORY_CARE_PROVIDER_SITE_OTHER): Payer: Medicare HMO | Admitting: Orthopaedic Surgery

## 2017-03-20 ENCOUNTER — Encounter (INDEPENDENT_AMBULATORY_CARE_PROVIDER_SITE_OTHER): Payer: Self-pay | Admitting: Orthopaedic Surgery

## 2017-03-20 VITALS — BP 140/82 | HR 74

## 2017-03-20 DIAGNOSIS — Z96652 Presence of left artificial knee joint: Secondary | ICD-10-CM | POA: Diagnosis not present

## 2017-03-20 DIAGNOSIS — M25562 Pain in left knee: Secondary | ICD-10-CM

## 2017-03-20 MED ORDER — LIDOCAINE HCL 1 % IJ SOLN
1.0000 mL | INTRAMUSCULAR | Status: AC | PRN
  Administered 2017-03-20: 1 mL

## 2017-03-20 NOTE — Progress Notes (Signed)
   Post-Op Visit Note   Patient: Terry Sloan           Date of Birth: [birth year]           MRN: [id number] Visit Date: 03/20/2017 PCP: Elenora GammaBradshaw, Samuel L, MD   Assessment & Plan: Post total knee arthroplasty was some synovitis. 30 mL serosanguineous fluid removed no purulence. He felt much better after the aspiration. He'll use some Aleve. He can stop his one aspirin a day nowadays 1 month postop. Recheck 1 week.  Chief Complaint:  Chief Complaint  Patient presents with  . Left Knee - Routine Post Op   Visit Diagnoses:  1. S/P total knee arthroplasty, left     Plan: Left knee aspiration performed. His reported more pain recently in 2 weeks ago. He reportedly has had a few episodes where he had a nocturnal bowel movements without recognition. He had problems with constipation took a lot of laxatives for several days after surgery. He states this is actually improving. He has no perianal or perineal numbness.  Follow-Up Instructions: No Follow-up on file.   Orders:  No orders of the defined types were placed in this encounter.  No orders of the defined types were placed in this encounter.   Imaging: No results found.  PMFS History: Patient Active Problem List   Diagnosis Date Noted  . Knee pain, left 11/03/2016  . Transient cerebral ischemia 10/16/2016  . Aphasia 10/16/2016  . Anxiety and depression 10/16/2016  . Leg pain, diffuse 10/07/2016  . PVD (peripheral vascular disease) (HCC) 09/23/2016  . Mixed hyperlipidemia 09/23/2016  . Weight loss, unintentional 09/23/2016   Past Medical History:  Diagnosis Date  . Arthritis   . GERD (gastroesophageal reflux disease)    [name]  . History of kidney stones   . Hyperlipidemia   . Macular degeneration   . PVD (peripheral vascular disease) (HCC)   . Stroke [facility](HCC)    TIA      Family History  Problem Relation Age of Onset  . Stroke Neg Hx     Past Surgical History:  Procedure Laterality Date  . Bone turmor Left    foreheah  . EYE SURGERY Bilateral    cataract removal  . FRACTURE SURGERY Left    leg   pins  . KNEE SURGERY Left   . NECK SURGERY     Tumor reomoved from head/neck in the 60s  . ROTATOR CUFF REPAIR Right    times 2  . TOTAL KNEE ARTHROPLASTY WITH HARDWARE REMOVAL Left 02/14/2017   Procedure: Removal Left Tibia Nail, Cemented Total Knee Arthroplasty;  Surgeon: Eldred MangesYates, Josemaria Brining C, MD;  [facility];  Service: Orthopedics;  Laterality: Left;   Social History   Occupational History  . Retired    Social History Main Topics  . Smoking status: Former Smoker    Packs/day: 1.50    Years: 20.00    Types: Cigarettes  . Smokeless tobacco: Never Used     Comment: Quit 20+ year ago  . Alcohol use Yes     Comment: Occasional beer  . Drug use: No  . Sexual activity: Not on file

## 2017-03-20 NOTE — Progress Notes (Signed)
Office Visit Note   Patient: Terry Sloan           Date of Birth: Sep 09, 1940           MRN: [id number] Visit Date: 03/20/2017              Requested by: Elenora GammaBradshaw, Samuel L, MD [address] [place], [place] [id number] PCP: Elenora GammaBradshaw, Samuel L, MD   Assessment & Plan: Visit Diagnoses:  1. S/P total knee arthroplasty, left     Plan: Aspiration performed 30 mL serosanguineous office follow-up 1 week. He got relief from the aspiration.  Follow-Up Instructions: No Follow-up on file.   Orders:  No orders of the defined types were placed in this encounter.  No orders of the defined types were placed in this encounter.     Procedures: Large Joint Inj Date/Time: 03/20/2017 9:40 AM Performed by: Eldred MangesYATES, Zakry Caso C Authorized by: Eldred MangesYATES, Korissa Horsford C   Consent Given by:  Patient Site marked: the procedure site was marked   Indications:  Pain and joint swelling Location:  Knee Site:  L knee Needle Size:  22 G Needle Length:  1.5 inches Approach:  Superolateral Ultrasound Guidance: No   Fluoroscopic Guidance: No   Arthrogram: No   Medications:  1 mL lidocaine 1 % Aspirate amount (mL):  30 Aspirate:  Blood-tinged Patient tolerance:  Patient tolerated the procedure well with no immediate complications     Clinical Data: No additional findings.   Subjective: Chief Complaint  Patient presents with  . Left Knee - Routine Post Op    HPI patient's had more pain suprapatellar region with some swelling and more difficulty walking is still using a cane. He has flexion past 90 lacks 5-10  reaching full extension.  Review of Systems unchanged   Objective: Vital Signs: BP 140/82   Pulse 74   Physical Exam incision well-healed mild synovitis. Tenderness in the suprapatellar region. No cellulitis.  Ortho Exam  Specialty Comments:  No specialty comments available.  Imaging: No results found.   PMFS History: Patient Active Problem List   Diagnosis Date Noted  . Knee pain, left  11/03/2016  . Transient cerebral ischemia 10/16/2016  . Aphasia 10/16/2016  . Anxiety and depression 10/16/2016  . Leg pain, diffuse 10/07/2016  . PVD (peripheral vascular disease) (HCC) 09/23/2016  . Mixed hyperlipidemia 09/23/2016  . Weight loss, unintentional 09/23/2016   Past Medical History:  Diagnosis Date  . Arthritis   . GERD (gastroesophageal reflux disease)    [name]  . History of kidney stones   . Hyperlipidemia   . Macular degeneration   . PVD (peripheral vascular disease) (HCC)   . Stroke [facility](HCC)    TIA      Family History  Problem Relation Age of Onset  . Stroke Neg Hx     Past Surgical History:  Procedure Laterality Date  . Bone turmor Left    foreheah  . EYE SURGERY Bilateral    cataract removal  . FRACTURE SURGERY Left    leg   pins  . KNEE SURGERY Left   . NECK SURGERY     Tumor reomoved from head/neck in the 60s  . ROTATOR CUFF REPAIR Right    times 2  . TOTAL KNEE ARTHROPLASTY WITH HARDWARE REMOVAL Left 02/14/2017   Procedure: Removal Left Tibia Nail, Cemented Total Knee Arthroplasty;  Surgeon: Eldred MangesYates, Piedad Standiford C, MD;  [facility];  Service: Orthopedics;  Laterality: Left;   Social History   Occupational  History  . Retired    Social History Main Topics  . Smoking status: Former Smoker    Packs/day: 1.50    Years: 20.00    Types: Cigarettes  . Smokeless tobacco: Never Used     Comment: Quit 20+ year ago  . Alcohol use Yes     Comment: Occasional beer  . Drug use: No  . Sexual activity: Not on file

## 2017-03-25 DIAGNOSIS — M1712 Unilateral primary osteoarthritis, left knee: Secondary | ICD-10-CM | POA: Diagnosis not present

## 2017-03-27 ENCOUNTER — Ambulatory Visit (INDEPENDENT_AMBULATORY_CARE_PROVIDER_SITE_OTHER): Payer: Medicare HMO | Admitting: Orthopaedic Surgery

## 2017-03-27 ENCOUNTER — Encounter (INDEPENDENT_AMBULATORY_CARE_PROVIDER_SITE_OTHER): Payer: Self-pay | Admitting: Orthopaedic Surgery

## 2017-03-27 VITALS — BP 127/83 | HR 75

## 2017-03-27 DIAGNOSIS — Z96652 Presence of left artificial knee joint: Secondary | ICD-10-CM

## 2017-03-27 NOTE — Progress Notes (Signed)
   Post-Op Visit Note   Patient: Terry Sloan           Date of Birth: 25-Jun-1940           MRN: [id number] Visit Date: 03/27/2017 PCP: Elenora GammaBradshaw, Samuel L, MD   Assessment & Plan: He gets some relief with aspiration of 30 mL serosanguineous fluid. There summary accumulation but not as much his last visit. We'll check him back again in 4 weeks continue strengthening exercises. He can work on getting the last few degrees of full extension for his knee he is flexing well and quad strength is progressing. Some of the swelling may be related to the hardware removal at the time of total knee arthroplasty which is a previous tibial nail with interlocking.  Chief Complaint:  Chief Complaint  Patient presents with  . Left Knee - Follow-up   Visit Diagnoses:  1. Status post total left knee replacement     Plan:I'll recheck him in 4 weeks if he has significant increase of fluid he can come back and be seen for aspiration prior to that. Continue therapy strengthening and work on extension.  Follow-Up Instructions: Return in about 4 weeks (around 04/24/2017).   Orders:  No orders of the defined types were placed in this encounter.  No orders of the defined types were placed in this encounter.   Imaging: No results found.  PMFS History: Patient Active Problem List   Diagnosis Date Noted  . Knee pain, left 11/03/2016  . Transient cerebral ischemia 10/16/2016  . Aphasia 10/16/2016  . Anxiety and depression 10/16/2016  . Leg pain, diffuse 10/07/2016  . PVD (peripheral vascular disease) (HCC) 09/23/2016  . Mixed hyperlipidemia 09/23/2016  . Weight loss, unintentional 09/23/2016   Past Medical History:  Diagnosis Date  . Arthritis   . GERD (gastroesophageal reflux disease)    [name]  . History of kidney stones   . Hyperlipidemia   . Macular degeneration   . PVD (peripheral vascular disease) (HCC)   . Stroke [facility](HCC)    TIA      Family History  Problem Relation Age of Onset  .  Stroke Neg Hx     Past Surgical History:  Procedure Laterality Date  . Bone turmor Left    foreheah  . EYE SURGERY Bilateral    cataract removal  . FRACTURE SURGERY Left    leg   pins  . KNEE SURGERY Left   . NECK SURGERY     Tumor reomoved from head/neck in the 60s  . ROTATOR CUFF REPAIR Right    times 2  . TOTAL KNEE ARTHROPLASTY WITH HARDWARE REMOVAL Left 02/14/2017   Procedure: Removal Left Tibia Nail, Cemented Total Knee Arthroplasty;  Surgeon: Eldred MangesYates, Mark C, MD;  [facility];  Service: Orthopedics;  Laterality: Left;   Social History   Occupational History  . Retired    Social History Main Topics  . Smoking status: Former Smoker    Packs/day: 1.50    Years: 20.00    Types: Cigarettes  . Smokeless tobacco: Never Used     Comment: Quit 20+ year ago  . Alcohol use Yes     Comment: Occasional beer  . Drug use: No  . Sexual activity: Not on file

## 2017-03-28 DIAGNOSIS — M1712 Unilateral primary osteoarthritis, left knee: Secondary | ICD-10-CM | POA: Diagnosis not present

## 2017-03-31 DIAGNOSIS — M1712 Unilateral primary osteoarthritis, left knee: Secondary | ICD-10-CM | POA: Diagnosis not present

## 2017-04-02 DIAGNOSIS — M1712 Unilateral primary osteoarthritis, left knee: Secondary | ICD-10-CM | POA: Diagnosis not present

## 2017-04-07 DIAGNOSIS — M1712 Unilateral primary osteoarthritis, left knee: Secondary | ICD-10-CM | POA: Diagnosis not present

## 2017-04-10 DIAGNOSIS — M1712 Unilateral primary osteoarthritis, left knee: Secondary | ICD-10-CM | POA: Diagnosis not present

## 2017-04-14 DIAGNOSIS — M1712 Unilateral primary osteoarthritis, left knee: Secondary | ICD-10-CM | POA: Diagnosis not present

## 2017-04-17 ENCOUNTER — Encounter (INDEPENDENT_AMBULATORY_CARE_PROVIDER_SITE_OTHER): Payer: Self-pay | Admitting: Orthopaedic Surgery

## 2017-04-17 ENCOUNTER — Ambulatory Visit (INDEPENDENT_AMBULATORY_CARE_PROVIDER_SITE_OTHER): Payer: Medicare HMO | Admitting: Orthopaedic Surgery

## 2017-04-17 VITALS — BP 184/96 | HR 67

## 2017-04-17 DIAGNOSIS — Z96652 Presence of left artificial knee joint: Secondary | ICD-10-CM

## 2017-04-17 DIAGNOSIS — M1712 Unilateral primary osteoarthritis, left knee: Secondary | ICD-10-CM | POA: Diagnosis not present

## 2017-04-17 NOTE — Progress Notes (Signed)
   Post-Op Visit Note   Patient: Terry Sloan           Date of Birth: 04-Dec-1940           MRN: [id number] Visit Date: 04/17/2017 PCP: Elenora GammaBradshaw, Samuel L, MD   Assessment & Plan: Is yellow serous fluid aspirated.  He had improvement in his pain.  He was therapy stop the Aleve.  He will see his local doctor for blood pressure check and will return in 3 weeks.  Chief Complaint:  Chief Complaint  Patient presents with  . Left Knee - Pain   Visit Diagnoses:  1. History of total left knee replacement     Plan: Patient's had problems with increased pain in his knee mild swelling at times he has had a lot of pain around the patella.  He lacks about 5 degrees reaching full extension but is able to flex past 90 degrees.  Blood pressure is elevated today at 180/96.  He states his been taking a lot of Aleve to help his knee.  Follow-Up Instructions: No Follow-up on file.   Orders:  No orders of the defined types were placed in this encounter.  No orders of the defined types were placed in this encounter.   Imaging: No results found.  PMFS History: Patient Active Problem List   Diagnosis Date Noted  . Knee pain, left 11/03/2016  . Transient cerebral ischemia 10/16/2016  . Aphasia 10/16/2016  . Anxiety and depression 10/16/2016  . Leg pain, diffuse 10/07/2016  . PVD (peripheral vascular disease) (HCC) 09/23/2016  . Mixed hyperlipidemia 09/23/2016  . Weight loss, unintentional 09/23/2016   Past Medical History:  Diagnosis Date  . Arthritis   . GERD (gastroesophageal reflux disease)    [name]  . History of kidney stones   . Hyperlipidemia   . Macular degeneration   . PVD (peripheral vascular disease) (HCC)   . Stroke [facility](HCC)    TIA      Family History  Problem Relation Age of Onset  . Stroke Neg Hx     Past Surgical History:  Procedure Laterality Date  . Bone turmor Left    foreheah  . EYE SURGERY Bilateral    cataract removal  . FRACTURE SURGERY Left    leg   pins    . KNEE SURGERY Left   . NECK SURGERY     Tumor reomoved from head/neck in the 60s  . ROTATOR CUFF REPAIR Right    times 2  . TOTAL KNEE ARTHROPLASTY WITH HARDWARE REMOVAL Left 02/14/2017   Procedure: Removal Left Tibia Nail, Cemented Total Knee Arthroplasty;  Surgeon: Eldred MangesYates, Reylene Stauder C, MD;  [facility];  Service: Orthopedics;  Laterality: Left;   Social History   Occupational History  . Occupation: Retired  Tobacco Use  . Smoking status: Former Smoker    Packs/day: 1.50    Years: 20.00    Pack years: 30.00    Types: Cigarettes  . Smokeless tobacco: Never Used  . Tobacco comment: Quit 20+ year ago  Substance and Sexual Activity  . Alcohol use: Yes    Comment: Occasional beer  . Drug use: No  . Sexual activity: Not on file

## 2017-04-18 ENCOUNTER — Encounter: Payer: Self-pay | Admitting: Family Medicine

## 2017-04-18 ENCOUNTER — Ambulatory Visit: Payer: Medicare HMO | Admitting: Family Medicine

## 2017-04-18 VITALS — BP 144/78 | HR 73 | Temp 97.6°F | Ht 71.5 in | Wt 183.4 lb

## 2017-04-18 DIAGNOSIS — D649 Anemia, unspecified: Secondary | ICD-10-CM

## 2017-04-18 DIAGNOSIS — R030 Elevated blood-pressure reading, without diagnosis of hypertension: Secondary | ICD-10-CM

## 2017-04-18 DIAGNOSIS — E782 Mixed hyperlipidemia: Secondary | ICD-10-CM | POA: Diagnosis not present

## 2017-04-18 NOTE — Progress Notes (Signed)
   HPI  Patient presents today here with elevated blood pressure Patient That he was at orthopedics 1 day ago and had a blood pressure measured at 184/96. His blood pressure has always been normal.  He states that he believes it was due to taking Aleve which she was placed on after the surgery.  He also notes that 2 nights ago he had presyncopal event when waking up at night, he states that he lie down in bed and felt that he was going to pass out.  He sat back up, laid down again and felt the same sensation again.  He had 2 pillows and felt better.  He believes this is due to elevated blood pressure, however it was unknown to him at that time.  He denies chest pain, headache  He states that his pain is okay without the medication and has stopped the medication 1 day ago.   PMH: Smoking status noted ROS: Per HPI  Objective: BP (!) 144/78   Pulse 73   Temp 97.6 F (36.4 C) (Oral)   Ht 5' 11.5" (1.816 m)   Wt 183 lb 6.4 oz (83.2 kg)   BMI 25.22 kg/m  Gen: NAD, alert, cooperative with exam HEENT: NCAT CV: RRR, good S1/S2, no murmur Resp: CTABL, no wheezes, non-labored Ext: No edema, warm Neuro: Alert and oriented, No gross deficits  Assessment and plan:  #Elevated blood pressure Likely NSAID related elevated blood pressure, patient has now stopped and started feeling better. Blood pressure log with 3-4 readings for about 1 month, recheck in 3-4 weeks. Labs today  #Hyperlipidemia Repeat labs, LDL goal less than 70  #Postoperative anemia Recent knee surgery, patient feels well, rechecking hemoglobin    Orders Placed This Encounter  Procedures  . CBC with Differential/Platelet  . CMP14+EGFR  . LDL Cholesterol, Direct    Laroy Apple, MD [facility] 04/18/2017, 9:56 AM

## 2017-04-18 NOTE — Patient Instructions (Signed)
Great to see you!  Check your blood pressure 3 times a week for about 1 month and come back to review them.

## 2017-04-19 LAB — CBC WITH DIFFERENTIAL/PLATELET
BASOS ABS: 0 10*3/uL (ref 0.0–0.2)
Basos: 1 %
EOS (ABSOLUTE): 0.1 10*3/uL (ref 0.0–0.4)
Eos: 4 %
HEMOGLOBIN: 12.2 g/dL — AB (ref 13.0–17.7)
Hematocrit: 38.2 % (ref 37.5–51.0)
Immature Grans (Abs): 0 10*3/uL (ref 0.0–0.1)
Immature Granulocytes: 0 %
LYMPHS ABS: 1 10*3/uL (ref 0.7–3.1)
Lymphs: 32 %
MCH: 30.8 pg (ref 26.6–33.0)
MCHC: 31.9 g/dL (ref 31.5–35.7)
MCV: 97 fL (ref 79–97)
MONOS ABS: 0.3 10*3/uL (ref 0.1–0.9)
Monocytes: 11 %
NEUTROS ABS: 1.6 10*3/uL (ref 1.4–7.0)
Neutrophils: 52 %
PLATELETS: 214 10*3/uL (ref 150–379)
RBC: 3.96 x10E6/uL — ABNORMAL LOW (ref 4.14–5.80)
RDW: 14.3 % (ref 12.3–15.4)
WBC: 3.2 10*3/uL — ABNORMAL LOW (ref 3.4–10.8)

## 2017-04-19 LAB — CMP14+EGFR
ALBUMIN: 3.9 g/dL (ref 3.5–4.8)
ALK PHOS: 74 IU/L (ref 39–117)
ALT: 11 IU/L (ref 0–44)
AST: 16 IU/L (ref 0–40)
Albumin/Globulin Ratio: 1.7 (ref 1.2–2.2)
BILIRUBIN TOTAL: 0.6 mg/dL (ref 0.0–1.2)
BUN / CREAT RATIO: 13 (ref 10–24)
BUN: 14 mg/dL (ref 8–27)
CHLORIDE: 107 mmol/L — AB (ref 96–106)
CO2: 24 mmol/L (ref 20–29)
Calcium: 9.1 mg/dL (ref 8.6–10.2)
Creatinine, Ser: 1.07 mg/dL (ref 0.76–1.27)
GFR calc Af Amer: 77 mL/min/{1.73_m2} (ref >59)
GFR calc non Af Amer: 67 mL/min/{1.73_m2} (ref >59)
Globulin, Total: 2.3 g/dL (ref 1.5–4.5)
Glucose: 97 mg/dL (ref 65–99)
Potassium: 4 mmol/L (ref 3.5–5.2)
Sodium: 142 mmol/L (ref 134–144)
Total Protein: 6.2 g/dL (ref 6.0–8.5)

## 2017-04-19 LAB — LDL CHOLESTEROL, DIRECT: LDL DIRECT: 94 mg/dL (ref 0–99)

## 2017-04-21 ENCOUNTER — Telehealth: Payer: Self-pay | Admitting: Family Medicine

## 2017-04-21 DIAGNOSIS — M1712 Unilateral primary osteoarthritis, left knee: Secondary | ICD-10-CM | POA: Diagnosis not present

## 2017-04-21 NOTE — Telephone Encounter (Signed)
PTleft a message - they will have to call back about starting a new cholesterol pill>

## 2017-04-22 ENCOUNTER — Other Ambulatory Visit: Payer: Self-pay | Admitting: *Deleted

## 2017-04-22 ENCOUNTER — Telehealth: Payer: Self-pay | Admitting: Family Medicine

## 2017-04-22 MED ORDER — ROSUVASTATIN CALCIUM 20 MG PO TABS: 20.0000 mg | ORAL_TABLET | Freq: Every day | ORAL | 2 refills | Status: DC

## 2017-04-22 NOTE — Telephone Encounter (Signed)
Spoke with patient's daughter Marcelle Smilingatasha.  Advised that Dr. Ermalinda MemosBradshaw is recommending a change in cholesterol medication to Crestor 20 mg daily instead of Pravastatin to try to get his LDL to a goal of less than 70.  Patient's daughter is agreeable.  [facility]  Scheduled patient for a follow up appointment 06/23/17.

## 2017-04-23 DIAGNOSIS — M1712 Unilateral primary osteoarthritis, left knee: Secondary | ICD-10-CM | POA: Diagnosis not present

## 2017-04-28 DIAGNOSIS — M1712 Unilateral primary osteoarthritis, left knee: Secondary | ICD-10-CM | POA: Diagnosis not present

## 2017-05-01 ENCOUNTER — Ambulatory Visit (INDEPENDENT_AMBULATORY_CARE_PROVIDER_SITE_OTHER): Payer: Medicare HMO | Admitting: Orthopaedic Surgery

## 2017-05-01 ENCOUNTER — Encounter (INDEPENDENT_AMBULATORY_CARE_PROVIDER_SITE_OTHER): Payer: Self-pay | Admitting: Orthopaedic Surgery

## 2017-05-01 VITALS — BP 148/84 | HR 70

## 2017-05-01 DIAGNOSIS — Z96652 Presence of left artificial knee joint: Secondary | ICD-10-CM

## 2017-05-01 NOTE — Progress Notes (Addendum)
   Post-Op Visit Note   Patient: Terry Sloan           Date of Birth: Oct 12, 1940           MRN: [id number] Visit Date: 05/01/2017 PCP: Elenora GammaBradshaw, Samuel L, MD   Assessment & Plan: Plasty he lacks about 5 degrees full extension he needs to be prone positioning with ankle weights we went over this in detail.  Check him back after the holidays.  Flexion is good , mild edema in his ankles. He requested and had TEDS applied knee high to help his ankle edema.   Chief Complaint:  Chief Complaint  Patient presents with  . Left Knee - Follow-up   Visit Diagnoses:  1. History of total left knee replacement     Plan: Return in 7 weeks for recheck he will work on extension.  Follow-Up Instructions: No Follow-up on file.   Orders:  No orders of the defined types were placed in this encounter.  No orders of the defined types were placed in this encounter.   Imaging: No results found.  PMFS History: Patient Active Problem List   Diagnosis Date Noted  . History of total left knee replacement 04/17/2017  . Transient cerebral ischemia 10/16/2016  . Aphasia 10/16/2016  . Anxiety and depression 10/16/2016  . Leg pain, diffuse 10/07/2016  . PVD (peripheral vascular disease) (HCC) 09/23/2016  . Mixed hyperlipidemia 09/23/2016  . Weight loss, unintentional 09/23/2016   Past Medical History:  Diagnosis Date  . Arthritis   . GERD (gastroesophageal reflux disease)    [name]  . History of kidney stones   . Hyperlipidemia   . Macular degeneration   . PVD (peripheral vascular disease) (HCC)   . Stroke [facility](HCC)    TIA      Family History  Problem Relation Age of Onset  . Stroke Neg Hx     Past Surgical History:  Procedure Laterality Date  . Bone turmor Left    foreheah  . EYE SURGERY Bilateral    cataract removal  . FRACTURE SURGERY Left    leg   pins  . KNEE SURGERY Left   . NECK SURGERY     Tumor reomoved from head/neck in the 60s  . ROTATOR CUFF REPAIR Right    times 2  .  TOTAL KNEE ARTHROPLASTY WITH HARDWARE REMOVAL Left 02/14/2017   Procedure: Removal Left Tibia Nail, Cemented Total Knee Arthroplasty;  Surgeon: Eldred MangesYates, Treena Cosman C, MD;  [facility];  Service: Orthopedics;  Laterality: Left;   Social History   Occupational History  . Occupation: Retired  Tobacco Use  . Smoking status: Former Smoker    Packs/day: 1.50    Years: 20.00    Pack years: 30.00    Types: Cigarettes  . Smokeless tobacco: Never Used  . Tobacco comment: Quit 20+ year ago  Substance and Sexual Activity  . Alcohol use: Yes    Comment: Occasional beer  . Drug use: No  . Sexual activity: Not on file

## 2017-05-07 ENCOUNTER — Inpatient Hospital Stay (INDEPENDENT_AMBULATORY_CARE_PROVIDER_SITE_OTHER): Payer: Medicare HMO | Admitting: Orthopaedic Surgery

## 2017-05-08 ENCOUNTER — Inpatient Hospital Stay (INDEPENDENT_AMBULATORY_CARE_PROVIDER_SITE_OTHER): Payer: Medicare HMO | Admitting: Orthopaedic Surgery

## 2017-05-14 ENCOUNTER — Encounter: Payer: Self-pay | Admitting: Family Medicine

## 2017-05-14 ENCOUNTER — Ambulatory Visit: Payer: Medicare HMO | Admitting: Family Medicine

## 2017-05-14 VITALS — BP 166/73 | HR 74 | Temp 98.1°F | Ht 71.5 in | Wt 188.0 lb

## 2017-05-14 DIAGNOSIS — M25562 Pain in left knee: Secondary | ICD-10-CM | POA: Diagnosis not present

## 2017-05-14 DIAGNOSIS — G8929 Other chronic pain: Secondary | ICD-10-CM | POA: Diagnosis not present

## 2017-05-14 DIAGNOSIS — R42 Dizziness and giddiness: Secondary | ICD-10-CM

## 2017-05-14 DIAGNOSIS — I951 Orthostatic hypotension: Secondary | ICD-10-CM

## 2017-05-14 DIAGNOSIS — R030 Elevated blood-pressure reading, without diagnosis of hypertension: Secondary | ICD-10-CM | POA: Diagnosis not present

## 2017-05-14 MED ORDER — ROSUVASTATIN CALCIUM 20 MG PO TABS: 20.0000 mg | ORAL_TABLET | ORAL | 2 refills | Status: DC

## 2017-05-14 MED ORDER — NAPROXEN 500 MG PO TABS: 500.0000 mg | ORAL_TABLET | Freq: Two times a day (BID) | ORAL | 0 refills | Status: DC

## 2017-05-14 NOTE — Progress Notes (Signed)
Subjective: CC: dizzy PCP: Elenora GammaBradshaw, Samuel L, MD XBJ:YNWGHPI:Terry Sloan is a 76 y.o. male, who was accompanied today's visit by his son. He is presenting to clinic today for:  1. Dizziness Patient notes that he has had dizziness for several months.  He thinks that this was worsened when his cholesterol medication was changed from pravastatin to Crestor.  He describes episodes as feeling lightheaded, particularly with position changes.  Denies vertigo.  Denies nausea, vomiting, weakness, numbness or tingling, speech abnormalities, dysphasia, changes in vision, facial drooping.  He reports normal fluid intake but does report that he drinks predominantly "drinks", which he describes as soft drinks.  He notes that blood pressures at home have been running 120s-130s/60s.  Denies chest pain, shortness of breath.  2.  Left knee pain Patient reports issues with his left knee since surgery.  He notes increased warmth, swelling and stiffness.  He denies fevers, chills, nausea, vomiting, erythema.  He notes that he saw his orthopedist about a week and a half ago for this and was told that this was within normal limits.  There was no concern for infection at that time.  He has not been taking any medications for his symptoms.  He previously used Aleve for several years but notes that he has been tapering off of this because he was told it was not good to use long-term.   ROS: Per HPI  Allergies  Allergen Reactions  . Celebrex [Celecoxib] Other (See Comments)    Leg swelling, broke out in rash   Past Medical History:  Diagnosis Date  . Arthritis   . GERD (gastroesophageal reflux disease)    [name]  . History of kidney stones   . Hyperlipidemia   . Macular degeneration   . PVD (peripheral vascular disease) (HCC)   . Stroke [facility](HCC)    TIA      Current Outpatient Medications:  .  rosuvastatin (CRESTOR) 20 MG tablet, Take 1 tablet (20 mg total) by mouth daily., Disp: 30 tablet, Rfl: 2 Social History     Socioeconomic History  . Marital status: Married    Spouse name: Not on file  . Number of children: 9  . Years of education: Not on file  . Highest education level: Not on file  Social Needs  . Financial resource strain: Not on file  . Food insecurity - worry: Not on file  . Food insecurity - inability: Not on file  . Transportation needs - medical: Not on file  . Transportation needs - non-medical: Not on file  Occupational History  . Occupation: Retired  Tobacco Use  . Smoking status: Former Smoker    Packs/day: 1.50    Years: 20.00    Pack years: 30.00    Types: Cigarettes  . Smokeless tobacco: Never Used  . Tobacco comment: Quit 20+ year ago  Substance and Sexual Activity  . Alcohol use: Yes    Comment: Occasional beer  . Drug use: No  . Sexual activity: Not on file  Other Topics Concern  . Not on file  Social History Narrative   Lives at home w/ his significant other   Right-handed   Caffeine: some coffee, 3 diet Mt Dew's per day   Family History  Problem Relation Age of Onset  . Stroke Neg Hx     Objective: Office vital signs reviewed. BP (!) 166/73   Pulse 74   Temp 98.1 F (36.7 C) (Oral)   Ht 5' 11.5" (1.816 m)  Wt 188 lb (85.3 kg)   BMI 25.86 kg/m   Physical Examination:  General: Awake, alert, well nourished, nontoxic appearing, No acute distress HEENT: Normal    Neck: No masses palpated. No lymphadenopathy    Ears: Tympanic membranes intact, normal light reflex, no erythema, no bulging    Eyes: PERRLA, extraocular membranes intact, sclera white    Nose: nasal turbinates moist, no nasal discharge    Throat: moist mucus membranes, no erythema, no tonsillar exudate.  Symmetrical rise of palate appreciated.  Airway is patent Cardio: regular rate and rhythm, S1S2 heard, soft systolic murmur appreciated at the right sternal border.   Pulm: clear to auscultation bilaterally, no wheezes, rhonchi or rales; normal work of breathing on room air MSK:  Normal gait.  Patient has about a 10-15 degree loss of active range of motion in extension of the left lower extremity.  Right lower extremity with about a 10 degree loss of active range of motion.  Left knee: Range of motion as above.  Swelling, mild increased warmth appreciated.  No tenderness to palpation.  Large well-healed midline scar appreciated.  Minimal erythema appreciated. Neuro: 5/5 lower extremity strength bilaterally.  Left does seem slightly weaker than right.  Cranial nerves II through XII grossly intact.  Alert and oriented x3.  Follows all commands.  No focal deficits.  No nystagmus appreciated.  Orthostatic VS for the past 24 hrs:  BP- Lying Pulse- Lying BP- Sitting Pulse- Sitting BP- Standing at 0 minutes Pulse- Standing at 0 minutes  05/14/17 1447 161/85 72 166/73 74 144/79 78    Assessment/ Plan: 76 y.o. male   1. Orthostatic hypotension I suspect that dizziness is secondary to orthostatic hypotension.  He had no neurologic deficits on today's exam.  Orthostatic vital signs was significant for a 22-point drop in systolic blood pressure from sitting to standing positions.  There is nothing on exam to suggest BPPV.  We reviewed hydration, changing positions slowly and obtaining compression hose to reduce symptoms of dizziness.  If persistent symptoms despite therapies, would consider obtaining brain imaging given history of TIA.  He has a follow-up with PCP next week, which I encouraged him to keep.  Strict return precautions and reasons for emergent evaluation in the emergency department review with patient.  They voiced understanding and will follow-up as needed.  2. Dizziness Patient feels strongly that the Crestor is the worsening factor of dizziness.  We discussed cutting the Crestor in half versus taking it every other day versus changing it to a new antilipid medication entirely.  Patient is amenable to taking this medication every other day.  We reviewed the importance of  the medication and he voiced good understanding.  3. Chronic pain of left knee Per patient, symptoms unchanged since evaluation by orthopedics 1.5 weeks ago.  I have a low suspicion for infection at this time.  However, this should still be kept amongst the differential diagnosis given his recent orthopedic surgery.  He has not taken any OTCs for symptoms.  I suspect that the inflammation is secondary to new hardware in the I have recommended that he take naproxen 500 mg twice daily as needed with a meal.  A 2-week supply was provided today.  He will follow-up with his PCP next week and if he has persistent symptoms would consider obtaining labs +/- imaging of that knee.  4. Elevated blood pressure reading Systolic blood pressure into the 160s during today's exam.  He was orthostatic with blood pressure 144/79.  He is supposed to follow-up with his PCP next week for continued blood pressure monitoring.  I did recommend that he bring in his blood pressure monitor for calibration against ours.  He will continue to keep a log and provide this to his PCP next week.   Meds ordered this encounter  Medications  . naproxen (NAPROSYN) 500 MG tablet    Sig: Take 1 tablet (500 mg total) by mouth 2 (two) times daily with a meal.    Dispense:  30 tablet    Refill:  0  . rosuvastatin (CRESTOR) 20 MG tablet    Sig: Take 1 tablet (20 mg total) by mouth every other day.    Dispense:  30 tablet    Refill:  2     Matei Magnone Hulen SkainsM Kejon Feild, DO [facility] [phone]

## 2017-05-14 NOTE — Patient Instructions (Addendum)
I think what is causing your dizziness is something called orthostatic hypotension.  Again, this is often caused when the vessels in the body become stiff as we age, making it hard for the blood pressure to adjust when we change positions.  I would like you to get up slowly when you change positions.  Make sure that you are drinking plenty of water such that your urine is clear to pale yellow.  Get compression hose/stockings to help with your dizziness.  If you develop any worrisome signs or symptoms like numbness and tingling, facial droop, changes in your vision, difficulty speaking or swallowing, weakness, please seek immediate medical attention in the emergency department as this may be signs of a stroke.  For your knee pain and swelling, I have prescribed naproxen 500 mg.  You may take this up to 2 times daily.  Make sure to have a meal with it each time.  Drink plenty of water while on this medication.  You do not have to use it for the full 2 weeks.  If your symptoms improve, you can discontinue the medication earlier.  Follow-up with your primary care doctor as scheduled next week.  Make sure that you bring in your blood pressure monitor so that we can compare the readings to ours.  Orthostatic Hypotension As your heart beats, it forces blood through your body. This force is called blood pressure. If you have hypotension, you have low blood pressure. When your blood pressure is too low, you may not get enough blood to your brain. You may feel weak, feel light-headed, have a fast heartbeat, or even pass out (faint). Follow these instructions at home: Eating and drinking  Drink enough fluids to keep your pee (urine) clear or pale yellow.  Eat a healthy diet, and follow instructions from your doctor about eating or drinking restrictions. A healthy diet includes: ? Fresh fruits and vegetables. ? Whole grains. ? Low-fat (lean) meats. ? Low-fat dairy products.  Eat extra salt only as told. Do not  add extra salt to your diet unless your doctor tells you to.  Eat small meals often.  Avoid standing up quickly after you eat. Medicines  Take over-the-counter and prescription medicines only as told by your doctor. ? Follow instructions from your doctor about changing how much you take (the dosage) of your medicines, if this applies. ? Do not stop or change your medicine on your own. General instructions  Wear compression stockings as told by your doctor.  Get up slowly from lying down or sitting.  Avoid hot showers and a lot of heat as told by your doctor.  Return to your normal activities as told by your doctor. Ask what activities are safe for you.  Do not use any products that contain nicotine or tobacco, such as cigarettes and e-cigarettes. If you need help quitting, ask your doctor.  Keep all follow-up visits as told by your doctor. This is important. Contact a doctor if:  You throw up (vomit).  You have watery poop (diarrhea).  You have a fever for more than 2-3 days.  You feel more thirsty than normal.  You feel weak and tired. Get help right away if:  You have chest pain.  You have a fast or irregular heartbeat.  You lose feeling (get numbness) in any part of your body.  You cannot move your arms or your legs.  You have trouble talking.  You get sweaty or feel light-headed.  You faint.  You  have trouble breathing.  You have trouble staying awake.  You feel confused. This information is not intended to replace advice given to you by your health care provider. Make sure you discuss any questions you have with your health care provider. Document Released: 08/14/2009 Document Revised: 02/06/2016 Document Reviewed: 02/06/2016 Elsevier Interactive Patient Education  2017 [employer].

## 2017-05-15 ENCOUNTER — Ambulatory Visit (INDEPENDENT_AMBULATORY_CARE_PROVIDER_SITE_OTHER): Payer: Medicare HMO | Admitting: Orthopaedic Surgery

## 2017-05-15 ENCOUNTER — Ambulatory Visit (INDEPENDENT_AMBULATORY_CARE_PROVIDER_SITE_OTHER): Payer: Self-pay

## 2017-05-15 ENCOUNTER — Encounter (INDEPENDENT_AMBULATORY_CARE_PROVIDER_SITE_OTHER): Payer: Self-pay | Admitting: Orthopaedic Surgery

## 2017-05-15 VITALS — BP 162/84 | HR 74

## 2017-05-15 DIAGNOSIS — Z96652 Presence of left artificial knee joint: Secondary | ICD-10-CM

## 2017-05-15 NOTE — Progress Notes (Signed)
   Post-Op Visit Note   Patient: Terry Sloan           Date of Birth: 1941/03/13           MRN: [id number] Visit Date: 05/15/2017 PCP: Elenora GammaBradshaw, Samuel L, MD   Assessment & Plan: Almost 3 months post left total knee arthroplasty.  He still has slight flexion contracture of his knee and has been working on extension.  His flexion is good he flexes past 110 degrees.  Quad strength is improving. `  Chief Complaint:  Chief Complaint  Patient presents with  . Left Knee - Pain   Visit Diagnoses:  1. History of total left knee replacement     Plan: Work on extension, quad strengthening.  Flexion is good and he understands he can get a few more degrees discussed exercises for him to work on.  Recheck 7 weeks.  Follow-Up Instructions: Return in about 7 weeks (around 07/03/2017).   Orders:  Orders Placed This Encounter  Procedures  . XR Knee 1-2 Views Left   No orders of the defined types were placed in this encounter.   Imaging: No results found.  PMFS History: Patient Active Problem List   Diagnosis Date Noted  . History of total left knee replacement 04/17/2017  . Transient cerebral ischemia 10/16/2016  . Aphasia 10/16/2016  . Anxiety and depression 10/16/2016  . Leg pain, diffuse 10/07/2016  . PVD (peripheral vascular disease) (HCC) 09/23/2016  . Mixed hyperlipidemia 09/23/2016  . Weight loss, unintentional 09/23/2016   Past Medical History:  Diagnosis Date  . Arthritis   . GERD (gastroesophageal reflux disease)    [name]  . History of kidney stones   . Hyperlipidemia   . Macular degeneration   . PVD (peripheral vascular disease) (HCC)   . Stroke [facility](HCC)    TIA      Family History  Problem Relation Age of Onset  . Stroke Neg Hx     Past Surgical History:  Procedure Laterality Date  . Bone turmor Left    foreheah  . EYE SURGERY Bilateral    cataract removal  . FRACTURE SURGERY Left    leg   pins  . KNEE SURGERY Left   . NECK SURGERY     Tumor reomoved  from head/neck in the 60s  . ROTATOR CUFF REPAIR Right    times 2  . TOTAL KNEE ARTHROPLASTY WITH HARDWARE REMOVAL Left 02/14/2017   Procedure: Removal Left Tibia Nail, Cemented Total Knee Arthroplasty;  Surgeon: Eldred MangesYates, Mark C, MD;  [facility];  Service: Orthopedics;  Laterality: Left;   Social History   Occupational History  . Occupation: Retired  Tobacco Use  . Smoking status: Former Smoker    Packs/day: 1.50    Years: 20.00    Pack years: 30.00    Types: Cigarettes  . Smokeless tobacco: Never Used  . Tobacco comment: Quit 20+ year ago  Substance and Sexual Activity  . Alcohol use: Yes    Comment: Occasional beer  . Drug use: No  . Sexual activity: Not on file

## 2017-05-20 ENCOUNTER — Encounter: Payer: Self-pay | Admitting: Family Medicine

## 2017-05-20 ENCOUNTER — Encounter (INDEPENDENT_AMBULATORY_CARE_PROVIDER_SITE_OTHER): Payer: Self-pay | Admitting: Orthopaedic Surgery

## 2017-05-20 ENCOUNTER — Ambulatory Visit: Payer: Medicare HMO | Admitting: Family Medicine

## 2017-05-20 VITALS — BP 152/85 | HR 66 | Temp 97.0°F | Ht 71.5 in | Wt 190.0 lb

## 2017-05-20 DIAGNOSIS — M25562 Pain in left knee: Secondary | ICD-10-CM

## 2017-05-20 DIAGNOSIS — I10 Essential (primary) hypertension: Secondary | ICD-10-CM | POA: Diagnosis not present

## 2017-05-20 DIAGNOSIS — E782 Mixed hyperlipidemia: Secondary | ICD-10-CM | POA: Diagnosis not present

## 2017-05-20 NOTE — Progress Notes (Signed)
   HPI  Patient presents today here with knee pain, also for blood pressure check and discuss hyperlipidemia.  Now taking 20 mg of Crestor every other day, is tolerating this well.  He had dizziness with taking it daily. He has a history of TIA which is why Crestor was started.  He had a recent knee surgery, he still having pain associated with that, however not persistent pain. Is doing well with physical therapy. He has tried Aleve and naproxen without relief, he will try Tylenol.  Updated blood pressure Patient with elevated blood pressure while visiting [place], however blood pressure readings at home have been 120s and 30s and very well controlled.   PMH: Smoking status noted ROS: Per HPI  Objective: BP (!) 152/85   Pulse 66   Temp (!) 97 F (36.1 C) (Oral)   Ht 5' 11.5" (1.816 m)   Wt 190 lb (86.2 kg)   BMI 26.13 kg/m  Gen: NAD, alert, cooperative with exam HEENT: NCAT CV: RRR, good S1/S2, no murmur Resp: CTABL, no wheezes, non-labored Ext: No edema, warm Neuro: Alert and oriented, No gross deficits  Assessment and plan:  #[place] hypertension Patient with very well controlled blood pressures at home, his blood pressure cuff has been checked against ours today and is accurate. Blood pressure in our office is typically in the 150s. Reassurance provided  #Hyperlipidemia LDL goal less than 70, patient tolerating Crestor every other day. Recheck labs in 4-6 weeks, that will be about 2 months total treatment  #Left knee pain After surgery, reassurance provided. Trial of Tylenol if needed, however it sounds like his pain is only intermittent    Orders Placed This Encounter  Procedures  . CMP14+EGFR    Standing Status:   Future    Standing Expiration Date:   05/20/2018  . Lipid panel    Standing Status:   Future    Standing Expiration Date:   05/20/2018     Laroy Apple, MD [facility] 05/20/2017, 2:50 PM

## 2017-05-20 NOTE — Patient Instructions (Signed)
Try tylenol 2 tabs twice daily for pain.   Come back in 4-6 weeks for repeat lab work for cholesterol.

## 2017-05-30 ENCOUNTER — Telehealth (INDEPENDENT_AMBULATORY_CARE_PROVIDER_SITE_OTHER): Payer: Self-pay | Admitting: Orthopaedic Surgery

## 2017-05-30 ENCOUNTER — Telehealth (INDEPENDENT_AMBULATORY_CARE_PROVIDER_SITE_OTHER): Payer: Self-pay | Admitting: Radiology

## 2017-05-30 NOTE — Telephone Encounter (Signed)
I left voicemail for patient to return my call. I did explain that Dr. Ophelia CharterYates is not in the office and there are no providers scheduled to be here this afternoon. If he is having extreme pain or difficulty weightbearing and needs to be seen urgently, I recommend for him to go to the Urgent Care or ED. I asked patient to please return my call if there is anything at all that I can help him with or do for him.

## 2017-05-30 NOTE — Telephone Encounter (Signed)
Fyi...  Patient's daughter called and states that her father continues to complain with his knee. She states that the swelling has gone down and he is not having pain but he feels something moving in there. She asked me to call her father and speak with him at [phone].  I called patient. He states that the "flap" that was put on to hold his knee cap has come loose. He feels that it is loose from the top. He denies pain unless he moves it a certain way, but states he can feel it moving all around. Appointment was made for patient on 06/05/17 at 0915 in [place] office. He is extremely frustrated. I explained I would pass this message along to Dr. Ophelia CharterYates so he would be aware.

## 2017-05-30 NOTE — Telephone Encounter (Signed)
Patient called saying that it feels like something came loose in his leg from his surgery, and would like to speak with you. CB # [phone]

## 2017-06-01 NOTE — Telephone Encounter (Signed)
I called discussed. No fever or chills. Sx as listed  Worse if he is on his knee a lot during the day.  He has appt 1/3 for recheck. FYI

## 2017-06-05 ENCOUNTER — Ambulatory Visit (HOSPITAL_COMMUNITY)
Admission: RE | Admit: 2017-06-05 | Discharge: 2017-06-05 | Disposition: A | Discharge status: Home / Self Care | Payer: Medicare HMO | Source: Ambulatory Visit | Attending: Orthopaedic Surgery | Admitting: Orthopaedic Surgery

## 2017-06-05 ENCOUNTER — Ambulatory Visit (INDEPENDENT_AMBULATORY_CARE_PROVIDER_SITE_OTHER): Payer: Medicare HMO | Admitting: Orthopaedic Surgery

## 2017-06-05 ENCOUNTER — Ambulatory Visit (INDEPENDENT_AMBULATORY_CARE_PROVIDER_SITE_OTHER): Payer: Self-pay

## 2017-06-05 ENCOUNTER — Telehealth (INDEPENDENT_AMBULATORY_CARE_PROVIDER_SITE_OTHER): Payer: Self-pay

## 2017-06-05 ENCOUNTER — Encounter (INDEPENDENT_AMBULATORY_CARE_PROVIDER_SITE_OTHER): Payer: Self-pay | Admitting: Orthopaedic Surgery

## 2017-06-05 VITALS — BP 134/77 | HR 69 | Ht 71.5 in | Wt 190.0 lb

## 2017-06-05 DIAGNOSIS — M25862 Other specified joint disorders, left knee: Secondary | ICD-10-CM

## 2017-06-05 DIAGNOSIS — Z96652 Presence of left artificial knee joint: Secondary | ICD-10-CM

## 2017-06-05 NOTE — Progress Notes (Signed)
*  Preliminary Results* Left lower extremity venous duplex completed. Left lower extremity is negative for deep vein thrombosis. There is no evidence of left Baker's cyst.  06/05/2017 2:53 PM  Gertie FeyMichelle Romney Compean, BS, RVT, RDCS, RDMS

## 2017-06-05 NOTE — Telephone Encounter (Signed)
Patient seen by Dr Ophelia CharterYates today. Doppler r/o DVT was ordered. [facility] but I was told that they no longer do them there and [facility] has to [facility]. [facility] To arrive at admissions. I have notified patient.

## 2017-06-05 NOTE — Progress Notes (Addendum)
Office Visit Note   Patient: Terry Sloan           Date of Birth: 12-19-1940           MRN: [id number] Visit Date: 06/05/2017              Requested by: Elenora Gamma, MD [address] [place], [place] [address] PCP: Elenora Gamma, MD   Assessment & Plan: Visit Diagnoses:  1. Patellar clunk syndrome, left   2. History of total left knee replacement     Plan: Left lower extremity edema will obtain a Doppler test to rule out DVT.  Patient will require outpatient arthroscopy for partial synovectomy for patellar clunk syndrome bothers him on a daily basis with gradual increasing symptoms.  Diagnosis discussed.  Schedule his outpatient arthroscopy after doppler test rules out DVT.   Addendum:  DOPPLER TEST WAS NEG. WILL PROCEED WITH OUTPATIENT ARTHROSCOPY AND PARTIAL SYNOVECTOMY FOR HIS PAINFUL PATELLAR CLUNK SYNDROME. HE WILL USE A CANE TO PREVENT FALLING IN THE MEAN TIME.  We discussed with him that the previous IM tibial nail may have caused some additional fluid in his knee post op and that patellar clunk syndrome is a Relatively rare problem after total knee arthroplasty but resection of the hypertrophic tissue should correct his problem and take care of the painful clunk and swelling he is having.   Follow-Up Instructions: No Follow-up on file.   Orders:  No orders of the defined types were placed in this encounter.  No orders of the defined types were placed in this encounter.     Procedures: No procedures performed   Clinical Data: No additional findings.   Subjective: Chief Complaint  Patient presents with  . Left Knee - Follow-up    HPI patient returns she is having gradual progressive increased problems with his left knee with the prominent clunk with flexion.  He points directly to the suprapatellar pouch and is able to actively flex and extend the knee make the clunk is try to work hard on the extension but the more he works on this to get his last few  degrees of extension the more his knee is swelled and the popping is worse by the end of the day.  He denies any drainage from the incision no cellulitis.  He has had some mild edema more in his left leg than right leg.  Total knee arthroplasty on the left was on 02/14/2017.  Review of Systems of systems updated positive for history of TIA, anxiety and depression, hyperlipidemia, peripheral vascular disease, previous left total neuroplasty 02/14/2017.   Objective: Vital Signs: BP 134/77   Pulse 69   Ht 5' 11.5" (1.816 m)   Wt 190 lb (86.2 kg)   BMI 26.13 kg/m   Physical Exam  Constitutional: He is oriented to person, place, and time. He appears well-developed and well-nourished.  HENT:  Head: Normocephalic and atraumatic.  Eyes: EOM are normal. Pupils are equal, round, and reactive to light.  Neck: No tracheal deviation present. No thyromegaly present.  Cardiovascular: Normal rate.  Pulmonary/Chest: Effort normal. He has no wheezes.  Abdominal: Soft. Bowel sounds are normal.  Neurological: He is alert and oriented to person, place, and time.  Skin: Skin is warm and dry. Capillary refill takes less than 2 seconds.  Psychiatric: He has a normal mood and affect. His behavior is normal. Judgment and thought content normal.    Ortho Exam patient has a well-healed left knee  incision is tibial nail removal and cemented total knee arthroplasty.  With flexion and extension he has a audible clunk as a mass of scar tissue superior lateral adjacent to the patella which is palpable and is about the size of a golf ball.  He demonstrates clunking when he tries to flex in a squat position.  He did not have problems with this after the surgery for a few months.  Distal pulses are intact no pain with hip range of motion.  He has flexion to 115 degrees comes up to within 2 degrees of full extension.  Collateral ligaments are balanced.  Patellar tracking is good other than the suprapatellar hypertrophic  synovium consistent with his patellar clunk syndrome.  Specialty Comments:  No specialty comments available.  Imaging: Knee x-rays 05/15/2017 shows well-positioned total knee arthroplasty without evidence of loosening good position and alignment with with good fit and fill of the tibial component and well-positioned femoral and patellar component.  No varus or valgus malalignment.  No loosening.   PMFS History: Patient Active Problem List   Diagnosis Date Noted  . History of total left knee replacement 04/17/2017  . Transient cerebral ischemia 10/16/2016  . Aphasia 10/16/2016  . Anxiety and depression 10/16/2016  . Leg pain, diffuse 10/07/2016  . PVD (peripheral vascular disease) (HCC) 09/23/2016  . Mixed hyperlipidemia 09/23/2016  . Weight loss, unintentional 09/23/2016   Past Medical History:  Diagnosis Date  . Arthritis   . GERD (gastroesophageal reflux disease)    [name]  . History of kidney stones   . Hyperlipidemia   . Macular degeneration   . PVD (peripheral vascular disease) (HCC)   . Stroke [facility](HCC)    TIA      Family History  Problem Relation Age of Onset  . Stroke Neg Hx     Past Surgical History:  Procedure Laterality Date  . Bone turmor Left    foreheah  . EYE SURGERY Bilateral    cataract removal  . FRACTURE SURGERY Left    leg   pins  . KNEE SURGERY Left   . NECK SURGERY     Tumor reomoved from head/neck in the 60s  . ROTATOR CUFF REPAIR Right    times 2  . TOTAL KNEE ARTHROPLASTY WITH HARDWARE REMOVAL Left 02/14/2017   Procedure: Removal Left Tibia Nail, Cemented Total Knee Arthroplasty;  Surgeon: Eldred MangesYates, Manvi Guilliams C, MD;  [facility];  Service: Orthopedics;  Laterality: Left;   Social History   Occupational History  . Occupation: Retired  Tobacco Use  . Smoking status: Former Smoker    Packs/day: 1.50    Years: 20.00    Pack years: 30.00    Types: Cigarettes  . Smokeless tobacco: Never Used  . Tobacco comment: Quit 20+ year ago  Substance  and Sexual Activity  . Alcohol use: Yes    Comment: Occasional beer  . Drug use: No  . Sexual activity: Not on file

## 2017-06-09 ENCOUNTER — Telehealth (INDEPENDENT_AMBULATORY_CARE_PROVIDER_SITE_OTHER): Payer: Self-pay

## 2017-06-09 ENCOUNTER — Encounter (INDEPENDENT_AMBULATORY_CARE_PROVIDER_SITE_OTHER): Payer: Self-pay | Admitting: Orthopaedic Surgery

## 2017-06-09 NOTE — Telephone Encounter (Signed)
Patient's daughter Marcelle Smilingatasha would like to know what the next step will be for her father?  Would like a CB at [phone].  Please advise.  Thank you.

## 2017-06-09 NOTE — Telephone Encounter (Signed)
I called discussed knee scope

## 2017-06-09 NOTE — Telephone Encounter (Signed)
Please advise 

## 2017-06-19 ENCOUNTER — Inpatient Hospital Stay (INDEPENDENT_AMBULATORY_CARE_PROVIDER_SITE_OTHER): Payer: Medicare HMO | Admitting: Orthopaedic Surgery

## 2017-06-23 ENCOUNTER — Other Ambulatory Visit: Payer: Medicare HMO

## 2017-06-23 ENCOUNTER — Ambulatory Visit: Payer: Self-pay | Admitting: Family Medicine

## 2017-06-23 DIAGNOSIS — M25862 Other specified joint disorders, left knee: Secondary | ICD-10-CM | POA: Diagnosis not present

## 2017-06-23 DIAGNOSIS — Z96652 Presence of left artificial knee joint: Secondary | ICD-10-CM | POA: Diagnosis not present

## 2017-06-23 DIAGNOSIS — M6752 Plica syndrome, left knee: Secondary | ICD-10-CM | POA: Diagnosis not present

## 2017-07-02 ENCOUNTER — Telehealth (INDEPENDENT_AMBULATORY_CARE_PROVIDER_SITE_OTHER): Payer: Self-pay | Admitting: Orthopaedic Surgery

## 2017-07-02 NOTE — Telephone Encounter (Signed)
Please see below. Let me know if I can do anything to help you. Thanks.

## 2017-07-02 NOTE — Telephone Encounter (Signed)
Patients daughter called needing to discuss the procedure he had to remove scar tissue. Please give her a call back # [phone]

## 2017-07-02 NOTE — Telephone Encounter (Signed)
Please advise 

## 2017-07-02 NOTE — Telephone Encounter (Signed)
Daughter said surgery was denied.  Denial letter she has states that debridement of cartilage was not authorized.  Patient has patellar clunk syndrome.  [place] in [place] tomorrow.  She will try to make the appointment.  I discussed with her that condition is unusual she has researched it on line and understands the condition.  Surgery is indicated and should correct this problem.FYI  Addendum to OV made, his doppler was neg which was ordered since he was having calf swelling with his patellar clunk syndrome. FYI. [facility] [facility] . Resubmit OV see what they do. thanks

## 2017-07-03 ENCOUNTER — Ambulatory Visit (INDEPENDENT_AMBULATORY_CARE_PROVIDER_SITE_OTHER): Payer: Medicare HMO | Admitting: Orthopaedic Surgery

## 2017-07-03 ENCOUNTER — Encounter (INDEPENDENT_AMBULATORY_CARE_PROVIDER_SITE_OTHER): Payer: Self-pay | Admitting: Orthopaedic Surgery

## 2017-07-03 VITALS — BP 107/71 | HR 82

## 2017-07-03 DIAGNOSIS — M25862 Other specified joint disorders, left knee: Secondary | ICD-10-CM

## 2017-07-03 MED ORDER — NAPROXEN 500 MG PO TABS: 500.0000 mg | ORAL_TABLET | Freq: Two times a day (BID) | ORAL | 0 refills | Status: DC

## 2017-07-03 NOTE — Progress Notes (Signed)
Post-Op Visit Note   Patient: Terry Sloan           Date of Birth: Sep 19, 1940           MRN: [id number] Visit Date: 07/03/2017 PCP: Elenora Gamma, MD   Assessment & Plan: Post arthroscopy after total knee arthroplasty and removal of tibial nail on 02/14/2017.  Risk factor for patellar clunk syndrome may have been the previous knee surgery with intramedullary rod placement.  Arthroscopic photos which he brought with him showed the hypertrophic synovium catching between the component and the patellar component which was debrided.  We reviewed and discussed these.  He has mild swelling occasionally is having a sharp nerve type pain lateral suprapatellar where the partial synovectomy was performed.  We discussed with him that surgery was appropriate he is no longer having the clunking he states he is not having pain in his knee now.  There is mild swelling we will have him resume the Naprosyn 500 mg p.o. twice daily for 3 weeks then he can stop it.  I will recheck him in 3-4 weeks.  3 sutures were removed today Steri-Strips applied.  Chief Complaint:  Chief Complaint  Patient presents with  . Left Knee - Routine Post Op   Visit Diagnoses:  1. Patellar clunk syndrome of left knee     Plan: Patient is no longer clunking.  Mild swelling of the knee as expected after partial synovectomy.  He will continue to work on quad strengthening knee range of motion.  Naprosyn 500 mg p.o. twice daily for 3 weeks.  Recheck 3-4 weeks.  Follow-Up Instructions: Return in about 3 weeks (around 07/24/2017).   Orders:  No orders of the defined types were placed in this encounter.  Meds ordered this encounter  Medications  . naproxen (NAPROSYN) 500 MG tablet    Sig: Take 1 tablet (500 mg total) by mouth 2 (two) times daily with a meal.    Dispense:  30 tablet    Refill:  0    Imaging: No results found.  PMFS History: Patient Active Problem List   Diagnosis Date Noted  . History of total left knee  replacement 04/17/2017  . Transient cerebral ischemia 10/16/2016  . Aphasia 10/16/2016  . Anxiety and depression 10/16/2016  . Leg pain, diffuse 10/07/2016  . PVD (peripheral vascular disease) (HCC) 09/23/2016  . Mixed hyperlipidemia 09/23/2016  . Weight loss, unintentional 09/23/2016   Past Medical History:  Diagnosis Date  . Arthritis   . GERD (gastroesophageal reflux disease)    [name]  . History of kidney stones   . Hyperlipidemia   . Macular degeneration   . PVD (peripheral vascular disease) (HCC)   . Stroke [facility])    TIA      Family History  Problem Relation Age of Onset  . Stroke Neg Hx     Past Surgical History:  Procedure Laterality Date  . Bone turmor Left    foreheah  . EYE SURGERY Bilateral    cataract removal  . FRACTURE SURGERY Left    leg   pins  . KNEE SURGERY Left   . NECK SURGERY     Tumor reomoved from head/neck in the 60s  . ROTATOR CUFF REPAIR Right    times 2  . TOTAL KNEE ARTHROPLASTY WITH HARDWARE REMOVAL Left 02/14/2017   Procedure: Removal Left Tibia Nail, Cemented Total Knee Arthroplasty;  Surgeon: Eldred Manges, MD;  Location: [facility];  Service: Orthopedics;  Laterality: Left;  Social History   Occupational History  . Occupation: Retired  Tobacco Use  . Smoking status: Former Smoker    Packs/day: 1.50    Years: 20.00    Pack years: 30.00    Types: Cigarettes  . Smokeless tobacco: Never Used  . Tobacco comment: Quit 20+ year ago  Substance and Sexual Activity  . Alcohol use: Yes    Comment: Occasional beer  . Drug use: No  . Sexual activity: Not on file

## 2017-07-04 NOTE — Telephone Encounter (Signed)
Amy, do you still have the precert information on Mr. Loleta ChanceHill? Can you please re-submit per the message from Dr. Ophelia CharterYates.  He states that he did an addendum to the office note.  Thank you

## 2017-07-24 ENCOUNTER — Ambulatory Visit (INDEPENDENT_AMBULATORY_CARE_PROVIDER_SITE_OTHER): Payer: Medicare HMO | Admitting: Orthopaedic Surgery

## 2017-07-24 ENCOUNTER — Encounter (INDEPENDENT_AMBULATORY_CARE_PROVIDER_SITE_OTHER): Payer: Self-pay | Admitting: Orthopaedic Surgery

## 2017-07-24 VITALS — BP 139/90 | HR 77 | Ht 71.5 in | Wt 190.0 lb

## 2017-07-24 DIAGNOSIS — Z96652 Presence of left artificial knee joint: Secondary | ICD-10-CM

## 2017-07-24 MED ORDER — NAPROXEN 500 MG PO TABS: 500.0000 mg | ORAL_TABLET | Freq: Two times a day (BID) | ORAL | 0 refills | Status: DC

## 2017-07-24 NOTE — Progress Notes (Signed)
   Post-Op Visit Note   Patient: Terry Sloan           Date of Birth: [birth year]           MRN: [id number] Visit Date: 07/24/2017 PCP: Elenora GammaBradshaw, Samuel L, MD   Assessment & Plan: Follow-up partial synovectomy for patellar clunk syndrome after left total knee arthroplasty.  Arthroscopic procedure 06/23/2017 and total knee arthroplasty, left was 02/14/2017.  He has some swelling when he is up on his leg he is not clunking he has good range of motion with near symmetrical extension which lacks about 4 5 degrees bilaterally.  Naprosyn was renewed.  Recheck 3 months.  Chief Complaint:  Chief Complaint  Patient presents with  . Left Knee - Routine Post Op, Follow-up   Visit Diagnoses:  1. History of total left knee replacement     Plan: 3 months recheck.  He will gradually work on strengthening activities.  Follow-Up Instructions: Return in about 3 months (around 10/21/2017).   Orders:  No orders of the defined types were placed in this encounter.  Meds ordered this encounter  Medications  . naproxen (NAPROSYN) 500 MG tablet    Sig: Take 1 tablet (500 mg total) by mouth 2 (two) times daily with a meal.    Dispense:  30 tablet    Refill:  0    Imaging: No results found.  PMFS History: Patient Active Problem List   Diagnosis Date Noted  . History of total left knee replacement 04/17/2017  . Transient cerebral ischemia 10/16/2016  . Aphasia 10/16/2016  . Anxiety and depression 10/16/2016  . Leg pain, diffuse 10/07/2016  . PVD (peripheral vascular disease) (HCC) 09/23/2016  . Mixed hyperlipidemia 09/23/2016  . Weight loss, unintentional 09/23/2016   Past Medical History:  Diagnosis Date  . Arthritis   . GERD (gastroesophageal reflux disease)    [name]  . History of kidney stones   . Hyperlipidemia   . Macular degeneration   . PVD (peripheral vascular disease) (HCC)   . Stroke [facility](HCC)    TIA      Family History  Problem Relation Age of Onset  . Stroke Neg Hx     Past  Surgical History:  Procedure Laterality Date  . Bone turmor Left    foreheah  . EYE SURGERY Bilateral    cataract removal  . FRACTURE SURGERY Left    leg   pins  . KNEE SURGERY Left   . NECK SURGERY     Tumor reomoved from head/neck in the 60s  . ROTATOR CUFF REPAIR Right    times 2  . TOTAL KNEE ARTHROPLASTY WITH HARDWARE REMOVAL Left 02/14/2017   Procedure: Removal Left Tibia Nail, Cemented Total Knee Arthroplasty;  Surgeon: Eldred MangesYates, Koleen Celia C, MD;  [facility];  Service: Orthopedics;  Laterality: Left;   Social History   Occupational History  . Occupation: Retired  Tobacco Use  . Smoking status: Former Smoker    Packs/day: 1.50    Years: 20.00    Pack years: 30.00    Types: Cigarettes  . Smokeless tobacco: Never Used  . Tobacco comment: Quit 20+ year ago  Substance and Sexual Activity  . Alcohol use: Yes    Comment: Occasional beer  . Drug use: No  . Sexual activity: Not on file

## 2017-08-08 ENCOUNTER — Ambulatory Visit (INDEPENDENT_AMBULATORY_CARE_PROVIDER_SITE_OTHER): Payer: Medicare HMO | Admitting: Family Medicine

## 2017-08-08 ENCOUNTER — Encounter: Payer: Self-pay | Admitting: Family Medicine

## 2017-08-08 VITALS — BP 124/85 | HR 85 | Temp 97.0°F | Ht 71.5 in | Wt 189.8 lb

## 2017-08-08 DIAGNOSIS — M25562 Pain in left knee: Secondary | ICD-10-CM

## 2017-08-08 DIAGNOSIS — Z96652 Presence of left artificial knee joint: Secondary | ICD-10-CM

## 2017-08-08 DIAGNOSIS — E782 Mixed hyperlipidemia: Secondary | ICD-10-CM | POA: Diagnosis not present

## 2017-08-08 DIAGNOSIS — J189 Pneumonia, unspecified organism: Secondary | ICD-10-CM

## 2017-08-08 MED ORDER — METHYLPREDNISOLONE ACETATE 80 MG/ML IJ SUSP
80.0000 mg | Freq: Once | INTRAMUSCULAR | Status: AC
  Administered 2017-08-08: 80 mg via INTRAMUSCULAR

## 2017-08-08 MED ORDER — PRAVASTATIN SODIUM 40 MG PO TABS: 40.0000 mg | ORAL_TABLET | Freq: Every day | ORAL | 3 refills | Status: DC

## 2017-08-08 MED ORDER — AZITHROMYCIN 250 MG PO TABS
ORAL_TABLET | ORAL | 0 refills | Status: DC
Start: 2017-08-08 — End: 2017-08-28

## 2017-08-08 NOTE — Progress Notes (Signed)
   HPI  Patient presents today here with acute illness, left knee pain, and hyperlipidemia.  Hyperlipidemia Patient states that Crestor was giving him GERD, he stopped it, he previously tolerated pravastatin well and wants to go back on that. He states that he has not been taking Crestor for several weeks.  Patient reports cough and cold symptoms that been going on for 7-8 days.  He complains of right-sided lower pleuritic chest pain described as sharp.  He has been coughing and having a difficult time sleeping because of cough. He is tolerating food and fluids like usual. He denies fever or chills. He has had shortness of breath.  Left knee pain Patient with history of left knee replacement, then development of patellar clunk syndrome with subsequent arthroscopic. Patient states that he believes something is not right with his knee and he would like a second opinion  PMH: Smoking status noted ROS: Per HPI  Objective: BP 124/85   Pulse 85   Temp (!) 97 F (36.1 C) (Oral)   Ht 5' 11.5" (1.816 m)   Wt 189 lb 12.8 oz (86.1 kg)   SpO2 97%   BMI 26.10 kg/m  Gen: NAD, alert, cooperative with exam HEENT: NCAT CV: RRR, good S1/S2, no murmur Resp: CTABL, no wheezes, non-labored Ext: No edema, warm Neuro: Alert and oriented, No gross deficits  Assessment and plan:  #Walking pneumonia Likely patient is developing atypical pneumonia. Treat with azithromycin Given IM Depo-Medrol for pain and cough  #Hyperlipidemia Discontinue Crestor, he was not taking this or tolerating it Restart pravastatin Labs 1 month  #History of knee replacement, left knee pain Patient requesting second opinion, [facility]   Orders Placed This Encounter  Procedures  . Ambulatory referral to Orthopedic Surgery    Referral Priority:   Routine    Referral Type:   Surgical    Referral Reason:   Specialty Services Required    Requested Specialty:   Orthopedic  Surgery    Number of Visits Requested:   1    Meds ordered this encounter  Medications  . pravastatin (PRAVACHOL) 40 MG tablet    Sig: Take 1 tablet (40 mg total) by mouth daily.    Dispense:  90 tablet    Refill:  3  . azithromycin (ZITHROMAX) 250 MG tablet    Sig: Take 2 tablets on day 1 and 1 tablet daily after that    Dispense:  6 tablet    Refill:  0  . methylPREDNISolone acetate (DEPO-MEDROL) injection 80 mg    Murtis SinkSam Litsy Epting, MD [facility] 08/08/2017, 4:53 PM

## 2017-08-08 NOTE — Patient Instructions (Signed)
Great to see you!   

## 2017-08-27 ENCOUNTER — Telehealth: Payer: Self-pay | Admitting: Family Medicine

## 2017-08-27 NOTE — Telephone Encounter (Signed)
appt scheduled for evaluation 

## 2017-08-28 ENCOUNTER — Ambulatory Visit (INDEPENDENT_AMBULATORY_CARE_PROVIDER_SITE_OTHER): Payer: Medicare HMO | Admitting: Family

## 2017-08-28 ENCOUNTER — Ambulatory Visit (INDEPENDENT_AMBULATORY_CARE_PROVIDER_SITE_OTHER): Payer: Medicare HMO

## 2017-08-28 ENCOUNTER — Other Ambulatory Visit: Payer: Self-pay | Admitting: Family

## 2017-08-28 ENCOUNTER — Encounter: Payer: Self-pay | Admitting: Family

## 2017-08-28 VITALS — BP 149/88 | HR 71 | Temp 97.0°F | Ht 71.5 in | Wt 191.2 lb

## 2017-08-28 DIAGNOSIS — M940 Chondrocostal junction syndrome [Tietze]: Secondary | ICD-10-CM

## 2017-08-28 DIAGNOSIS — R0781 Pleurodynia: Secondary | ICD-10-CM | POA: Diagnosis not present

## 2017-08-28 DIAGNOSIS — J984 Other disorders of lung: Secondary | ICD-10-CM | POA: Diagnosis not present

## 2017-08-28 MED ORDER — NAPROXEN 500 MG PO TABS: 500.0000 mg | ORAL_TABLET | Freq: Two times a day (BID) | ORAL | 0 refills | Status: DC

## 2017-08-28 NOTE — Patient Instructions (Signed)
Costochondritis Costochondritis is swelling and irritation (inflammation) of the tissue (cartilage) that connects your ribs to your breastbone (sternum). This causes pain in the front of your chest. The pain usually starts gradually and involves more than one rib. What are the causes? The exact cause of this condition is not always known. It results from stress on the cartilage where your ribs attach to your sternum. The cause of this stress could be:  Chest injury (trauma).  Exercise or activity, such as lifting.  Severe coughing.  What increases the risk? You may be at higher risk for this condition if you:  Are male.  Are 30?78 years old.  Recently started a new exercise or work activity.  Have low levels of vitamin D.  Have a condition that makes you cough frequently.  What are the signs or symptoms? The main symptom of this condition is chest pain. The pain:  Usually starts gradually and can be sharp or dull.  Gets worse with deep breathing, coughing, or exercise.  Gets better with rest.  May be worse when you press on the sternum-rib connection (tenderness).  How is this diagnosed? This condition is diagnosed based on your symptoms, medical history, and a physical exam. Your health care provider will check for tenderness when pressing on your sternum. This is the most important finding. You may also have tests to rule out other causes of chest pain. These may include:  A chest X-ray to check for lung problems.  An electrocardiogram (ECG) to see if you have a heart problem that could be causing the pain.  An imaging scan to rule out a chest or rib fracture.  How is this treated? This condition usually goes away on its own over time. Your health care provider may prescribe an NSAID to reduce pain and inflammation. Your health care provider may also suggest that you:  Rest and avoid activities that make pain worse.  Apply heat or cold to the area to reduce pain  and inflammation.  Do exercises to stretch your chest muscles.  If these treatments do not help, your health care provider may inject a numbing medicine at the sternum-rib connection to help relieve the pain. Follow these instructions at home:  Avoid activities that make pain worse. This includes any activities that use chest, abdominal, and side muscles.  If directed, put ice on the painful area: ? Put ice in a plastic bag. ? Place a towel between your skin and the bag. ? Leave the ice on for 20 minutes, 2-3 times a day.  If directed, apply heat to the affected area as often as told by your health care provider. Use the heat source that your health care provider recommends, such as a moist heat pack or a heating pad. ? Place a towel between your skin and the heat source. ? Leave the heat on for 20-30 minutes. ? Remove the heat if your skin turns bright red. This is especially important if you are unable to feel pain, heat, or cold. You may have a greater risk of getting burned.  Take over-the-counter and prescription medicines only as told by your health care provider.  Return to your normal activities as told by your health care provider. Ask your health care provider what activities are safe for you.  Keep all follow-up visits as told by your health care provider. This is important. Contact a health care provider if:  You have chills or a fever.  Your pain does not go   away or it gets worse.  You have a cough that does not go away (is persistent). Get help right away if:  You have shortness of breath. This information is not intended to replace advice given to you by your health care provider. Make sure you discuss any questions you have with your health care provider. Document Released: 02/27/2005 Document Revised: 12/08/2015 Document Reviewed: 09/13/2015 Elsevier Interactive Patient Education  2018 Elsevier Inc.   

## 2017-08-28 NOTE — Progress Notes (Signed)
   Subjective:    Patient ID: Terry Sloan, male    DOB: 1940-11-29, 77 y.o.   MRN: [id number]  HPI PT presents to the office today with right rib pain. He reports he had pneumonia three weeks ago with "a lot of coughing". Pt reports intermittent aching pain of 10 out 10 that is worse when he coughs.   PT has taken Aleve with mild relief.   Review of Systems  Respiratory: Negative for cough and shortness of breath.   Musculoskeletal:       Rib pain   All other systems reviewed and are negative.      Objective:   Physical Exam  Constitutional: He is oriented to person, place, and time. He appears well-developed and well-nourished. No distress.  HENT:  Head: Normocephalic.  Eyes: Pupils are equal, round, and reactive to light. Right eye exhibits no discharge. Left eye exhibits no discharge.  Neck: Normal range of motion. Neck supple. No thyromegaly present.  Cardiovascular: Normal rate, regular rhythm, normal heart sounds and intact distal pulses.  No murmur heard. Pulmonary/Chest: Effort normal and breath sounds normal. No respiratory distress. He has no wheezes.  Abdominal: Soft. Bowel sounds are normal. He exhibits no distension. There is no tenderness.  Musculoskeletal: Normal range of motion. He exhibits tenderness (mild tenderness in right lower ribs). He exhibits no edema.  Neurological: He is alert and oriented to person, place, and time.  Skin: Skin is warm and dry. No rash noted. No erythema.  Psychiatric: He has a normal mood and affect. His behavior is normal. Judgment and thought content normal.  Vitals reviewed.  X-ray- Negative, Preliminary reading by Jannifer Rodneyhristy Pleas Carneal, FNP [facility]    BP (!) 149/88   Pulse 71   Temp (!) 97 F (36.1 C) (Oral)   Ht 5' 11.5" (1.816 m)   Wt 191 lb 3.2 oz (86.7 kg)   BMI 26.30 kg/m      Assessment & Plan:  1. Rib pain - DG Chest 2 View; Future - naproxen (NAPROSYN) 500 MG tablet; Take 1 tablet (500 mg total) by mouth 2 (two) times  daily with a meal.  Dispense: 30 tablet; Refill: 0  2. Costochondritis Rest Ice Since coughing has resolved will try naproxen BID for next 5-7 days with food. Do not take any other NSAID's RTO if symptoms worsen or do not improve - naproxen (NAPROSYN) 500 MG tablet; Take 1 tablet (500 mg total) by mouth 2 (two) times daily with a meal.  Dispense: 30 tablet; Refill: 0    Jannifer Rodneyhristy Yuritzi Kamp, FNP

## 2017-08-29 DIAGNOSIS — M24662 Ankylosis, left knee: Secondary | ICD-10-CM | POA: Diagnosis not present

## 2017-08-29 DIAGNOSIS — Z471 Aftercare following joint replacement surgery: Secondary | ICD-10-CM | POA: Diagnosis not present

## 2017-08-29 DIAGNOSIS — M25862 Other specified joint disorders, left knee: Secondary | ICD-10-CM | POA: Diagnosis not present

## 2017-08-29 DIAGNOSIS — Z881 Allergy status to other antibiotic agents status: Secondary | ICD-10-CM | POA: Diagnosis not present

## 2017-08-29 DIAGNOSIS — Z96652 Presence of left artificial knee joint: Secondary | ICD-10-CM | POA: Diagnosis not present

## 2017-08-29 DIAGNOSIS — M25462 Effusion, left knee: Secondary | ICD-10-CM | POA: Diagnosis not present

## 2017-09-10 DIAGNOSIS — R2689 Other abnormalities of gait and mobility: Secondary | ICD-10-CM | POA: Diagnosis not present

## 2017-09-10 DIAGNOSIS — Z471 Aftercare following joint replacement surgery: Secondary | ICD-10-CM | POA: Diagnosis not present

## 2017-09-10 DIAGNOSIS — M25662 Stiffness of left knee, not elsewhere classified: Secondary | ICD-10-CM | POA: Diagnosis not present

## 2017-09-10 DIAGNOSIS — Z96652 Presence of left artificial knee joint: Secondary | ICD-10-CM | POA: Diagnosis not present

## 2017-09-10 DIAGNOSIS — M6281 Muscle weakness (generalized): Secondary | ICD-10-CM | POA: Diagnosis not present

## 2017-09-11 DIAGNOSIS — Z471 Aftercare following joint replacement surgery: Secondary | ICD-10-CM | POA: Diagnosis not present

## 2017-09-11 DIAGNOSIS — M25662 Stiffness of left knee, not elsewhere classified: Secondary | ICD-10-CM | POA: Diagnosis not present

## 2017-09-11 DIAGNOSIS — R2689 Other abnormalities of gait and mobility: Secondary | ICD-10-CM | POA: Diagnosis not present

## 2017-09-11 DIAGNOSIS — Z96652 Presence of left artificial knee joint: Secondary | ICD-10-CM | POA: Diagnosis not present

## 2017-09-11 DIAGNOSIS — M6281 Muscle weakness (generalized): Secondary | ICD-10-CM | POA: Diagnosis not present

## 2017-09-16 DIAGNOSIS — Z471 Aftercare following joint replacement surgery: Secondary | ICD-10-CM | POA: Diagnosis not present

## 2017-09-16 DIAGNOSIS — M25662 Stiffness of left knee, not elsewhere classified: Secondary | ICD-10-CM | POA: Diagnosis not present

## 2017-09-16 DIAGNOSIS — Z96652 Presence of left artificial knee joint: Secondary | ICD-10-CM | POA: Diagnosis not present

## 2017-09-16 DIAGNOSIS — M6281 Muscle weakness (generalized): Secondary | ICD-10-CM | POA: Diagnosis not present

## 2017-09-16 DIAGNOSIS — R2689 Other abnormalities of gait and mobility: Secondary | ICD-10-CM | POA: Diagnosis not present

## 2017-09-17 ENCOUNTER — Ambulatory Visit (INDEPENDENT_AMBULATORY_CARE_PROVIDER_SITE_OTHER): Payer: Medicare HMO | Admitting: Family Medicine

## 2017-09-17 ENCOUNTER — Encounter: Payer: Self-pay | Admitting: Family Medicine

## 2017-09-17 ENCOUNTER — Ambulatory Visit (INDEPENDENT_AMBULATORY_CARE_PROVIDER_SITE_OTHER): Payer: Medicare HMO

## 2017-09-17 ENCOUNTER — Telehealth: Payer: Self-pay | Admitting: Family Medicine

## 2017-09-17 ENCOUNTER — Other Ambulatory Visit: Payer: Self-pay | Admitting: Family Medicine

## 2017-09-17 ENCOUNTER — Ambulatory Visit: Payer: Self-pay | Admitting: Family Medicine

## 2017-09-17 VITALS — BP 122/77 | HR 75 | Temp 97.0°F | Ht 71.5 in | Wt 190.4 lb

## 2017-09-17 DIAGNOSIS — R3914 Feeling of incomplete bladder emptying: Secondary | ICD-10-CM | POA: Diagnosis not present

## 2017-09-17 DIAGNOSIS — R0781 Pleurodynia: Secondary | ICD-10-CM

## 2017-09-17 DIAGNOSIS — E782 Mixed hyperlipidemia: Secondary | ICD-10-CM

## 2017-09-17 DIAGNOSIS — N401 Enlarged prostate with lower urinary tract symptoms: Secondary | ICD-10-CM

## 2017-09-17 DIAGNOSIS — N3941 Urge incontinence: Secondary | ICD-10-CM

## 2017-09-17 MED ORDER — TAMSULOSIN HCL 0.4 MG PO CAPS: 0.4000 mg | ORAL_CAPSULE | Freq: Every day | ORAL | 3 refills | Status: DC

## 2017-09-17 NOTE — Telephone Encounter (Signed)
Flomax sent, discussed possibilty of allergy and then forgot to send it.   Murtis SinkSam Kalieb Freeland, MD [facility] 09/17/2017, 4:17 PM

## 2017-09-17 NOTE — Progress Notes (Signed)
   HPI  Patient presents today for follow-up.  BPH and urge incontinence Patient states that he is been having issues for 6-12 months. Patient states that he has to wear a diaper at night because he goes without noticing.  At times he also has the urge to go and has difficulty making it to the bathroom before urinating. He does report weak stream and incomplete bladder emptying as well.  Patient states that he believes he has a right rib fracture, this was fractured years ago but also he believes it was recently fractured during an illness due to coughing. He has persistent pain there, however it is improved today compared to yesterday.  Hyperlipidemia He is tolerating pravastatin well  PMH: Smoking status noted ROS: Per HPI  Objective: BP 122/77   Pulse 75   Temp (!) 97 F (36.1 C) (Oral)   Ht 5' 11.5" (1.816 m)   Wt 190 lb 6.4 oz (86.4 kg)   BMI 26.19 kg/m  Gen: NAD, alert, cooperative with exam HEENT: NCAT, EOMI, PERRL CV: RRR, good S1/S2, no murmur Resp: CTABL, no wheezes, non-labored Abdominal wall: Tenderness to palpation over the distal rib cage in the mid axillary line Ext: No edema, warm Neuro: Alert and oriented, No gross deficits  Assessment and plan:  #Rib pain Rib films today, discussed usual healing of rib fractures, unlikely fracture based on my exam today but I understand his persistent pain and believe a plain film is very reasonable to evaluate.   #BPH, urge incontinence Patient with more symptoms than typical presentation, also nighttime incontinence is not typically associated with BPH PSA Refer to urology  #Hyperlipidemia Continue statin, repeat labs today Tolerating pravastatin well  Orders Placed This Encounter  Procedures  . Lipid panel  . CMP14+EGFR  . PSA  . Ambulatory referral to Urology    Referral Priority:   Routine    Referral Type:   Consultation    Referral Reason:   Specialty Services Required    Requested Specialty:    Urology    Number of Visits Requested:   [place], MD [place] 09/17/2017, 11:42 AM

## 2017-09-17 NOTE — Telephone Encounter (Signed)
Daughter aware.

## 2017-09-17 NOTE — Patient Instructions (Signed)
Great to see you!  Come back to see Dr. Louanne Skyeettinger in 4 months  We will work on a referral and let you know about xray results and  Lab results within 1 week

## 2017-09-18 ENCOUNTER — Telehealth: Payer: Self-pay | Admitting: Family Medicine

## 2017-09-18 DIAGNOSIS — Z471 Aftercare following joint replacement surgery: Secondary | ICD-10-CM | POA: Diagnosis not present

## 2017-09-18 DIAGNOSIS — M6281 Muscle weakness (generalized): Secondary | ICD-10-CM | POA: Diagnosis not present

## 2017-09-18 DIAGNOSIS — Z96652 Presence of left artificial knee joint: Secondary | ICD-10-CM | POA: Diagnosis not present

## 2017-09-18 DIAGNOSIS — M25662 Stiffness of left knee, not elsewhere classified: Secondary | ICD-10-CM | POA: Diagnosis not present

## 2017-09-18 DIAGNOSIS — R2689 Other abnormalities of gait and mobility: Secondary | ICD-10-CM | POA: Diagnosis not present

## 2017-09-18 LAB — CMP14+EGFR
A/G RATIO: 2.1 (ref 1.2–2.2)
ALT: 14 IU/L (ref 0–44)
AST: 23 IU/L (ref 0–40)
Albumin: 4.5 g/dL (ref 3.5–4.8)
Alkaline Phosphatase: 80 IU/L (ref 39–117)
BILIRUBIN TOTAL: 0.9 mg/dL (ref 0.0–1.2)
BUN/Creatinine Ratio: 14 (ref 10–24)
BUN: 19 mg/dL (ref 8–27)
CALCIUM: 9.4 mg/dL (ref 8.6–10.2)
CHLORIDE: 102 mmol/L (ref 96–106)
CO2: 23 mmol/L (ref 20–29)
Creatinine, Ser: 1.36 mg/dL — ABNORMAL HIGH (ref 0.76–1.27)
GFR calc Af Amer: 57 mL/min/{1.73_m2} — ABNORMAL LOW (ref >59)
GFR, EST NON AFRICAN AMERICAN: 49 mL/min/{1.73_m2} — AB (ref >59)
GLOBULIN, TOTAL: 2.1 g/dL (ref 1.5–4.5)
Glucose: 80 mg/dL (ref 65–99)
POTASSIUM: 4.1 mmol/L (ref 3.5–5.2)
SODIUM: 142 mmol/L (ref 134–144)
Total Protein: 6.6 g/dL (ref 6.0–8.5)

## 2017-09-18 LAB — LIPID PANEL
Chol/HDL Ratio: 3.2 ratio (ref 0.0–5.0)
Cholesterol, Total: 154 mg/dL (ref 100–199)
HDL: 48 mg/dL (ref >39)
LDL CALC: 92 mg/dL (ref 0–99)
Triglycerides: 70 mg/dL (ref 0–149)
VLDL CHOLESTEROL CAL: 14 mg/dL (ref 5–40)

## 2017-09-18 LAB — PSA: Prostate Specific Ag, Serum: 1.2 ng/mL (ref 0.0–4.0)

## 2017-09-18 NOTE — Telephone Encounter (Signed)
Aware and verbalizes understanding.  

## 2017-09-23 DIAGNOSIS — Z471 Aftercare following joint replacement surgery: Secondary | ICD-10-CM | POA: Diagnosis not present

## 2017-09-23 DIAGNOSIS — M6281 Muscle weakness (generalized): Secondary | ICD-10-CM | POA: Diagnosis not present

## 2017-09-23 DIAGNOSIS — Z96652 Presence of left artificial knee joint: Secondary | ICD-10-CM | POA: Diagnosis not present

## 2017-09-23 DIAGNOSIS — R2689 Other abnormalities of gait and mobility: Secondary | ICD-10-CM | POA: Diagnosis not present

## 2017-09-23 DIAGNOSIS — M25662 Stiffness of left knee, not elsewhere classified: Secondary | ICD-10-CM | POA: Diagnosis not present

## 2017-09-25 DIAGNOSIS — M25662 Stiffness of left knee, not elsewhere classified: Secondary | ICD-10-CM | POA: Diagnosis not present

## 2017-09-25 DIAGNOSIS — R2689 Other abnormalities of gait and mobility: Secondary | ICD-10-CM | POA: Diagnosis not present

## 2017-09-25 DIAGNOSIS — Z96652 Presence of left artificial knee joint: Secondary | ICD-10-CM | POA: Diagnosis not present

## 2017-09-25 DIAGNOSIS — M6281 Muscle weakness (generalized): Secondary | ICD-10-CM | POA: Diagnosis not present

## 2017-09-25 DIAGNOSIS — Z471 Aftercare following joint replacement surgery: Secondary | ICD-10-CM | POA: Diagnosis not present

## 2017-09-30 DIAGNOSIS — M6281 Muscle weakness (generalized): Secondary | ICD-10-CM | POA: Diagnosis not present

## 2017-09-30 DIAGNOSIS — R2689 Other abnormalities of gait and mobility: Secondary | ICD-10-CM | POA: Diagnosis not present

## 2017-09-30 DIAGNOSIS — Z471 Aftercare following joint replacement surgery: Secondary | ICD-10-CM | POA: Diagnosis not present

## 2017-09-30 DIAGNOSIS — M25662 Stiffness of left knee, not elsewhere classified: Secondary | ICD-10-CM | POA: Diagnosis not present

## 2017-09-30 DIAGNOSIS — Z96652 Presence of left artificial knee joint: Secondary | ICD-10-CM | POA: Diagnosis not present

## 2017-10-02 DIAGNOSIS — M25662 Stiffness of left knee, not elsewhere classified: Secondary | ICD-10-CM | POA: Diagnosis not present

## 2017-10-02 DIAGNOSIS — Z471 Aftercare following joint replacement surgery: Secondary | ICD-10-CM | POA: Diagnosis not present

## 2017-10-02 DIAGNOSIS — M6281 Muscle weakness (generalized): Secondary | ICD-10-CM | POA: Diagnosis not present

## 2017-10-02 DIAGNOSIS — R2689 Other abnormalities of gait and mobility: Secondary | ICD-10-CM | POA: Diagnosis not present

## 2017-10-02 DIAGNOSIS — Z96652 Presence of left artificial knee joint: Secondary | ICD-10-CM | POA: Diagnosis not present

## 2017-10-07 DIAGNOSIS — R2689 Other abnormalities of gait and mobility: Secondary | ICD-10-CM | POA: Diagnosis not present

## 2017-10-07 DIAGNOSIS — Z471 Aftercare following joint replacement surgery: Secondary | ICD-10-CM | POA: Diagnosis not present

## 2017-10-07 DIAGNOSIS — M25662 Stiffness of left knee, not elsewhere classified: Secondary | ICD-10-CM | POA: Diagnosis not present

## 2017-10-07 DIAGNOSIS — M6281 Muscle weakness (generalized): Secondary | ICD-10-CM | POA: Diagnosis not present

## 2017-10-07 DIAGNOSIS — Z96652 Presence of left artificial knee joint: Secondary | ICD-10-CM | POA: Diagnosis not present

## 2017-10-09 DIAGNOSIS — R2689 Other abnormalities of gait and mobility: Secondary | ICD-10-CM | POA: Diagnosis not present

## 2017-10-09 DIAGNOSIS — Z96652 Presence of left artificial knee joint: Secondary | ICD-10-CM | POA: Diagnosis not present

## 2017-10-09 DIAGNOSIS — M25662 Stiffness of left knee, not elsewhere classified: Secondary | ICD-10-CM | POA: Diagnosis not present

## 2017-10-09 DIAGNOSIS — M6281 Muscle weakness (generalized): Secondary | ICD-10-CM | POA: Diagnosis not present

## 2017-10-09 DIAGNOSIS — Z471 Aftercare following joint replacement surgery: Secondary | ICD-10-CM | POA: Diagnosis not present

## 2017-10-14 DIAGNOSIS — Z471 Aftercare following joint replacement surgery: Secondary | ICD-10-CM | POA: Diagnosis not present

## 2017-10-14 DIAGNOSIS — R2689 Other abnormalities of gait and mobility: Secondary | ICD-10-CM | POA: Diagnosis not present

## 2017-10-14 DIAGNOSIS — M6281 Muscle weakness (generalized): Secondary | ICD-10-CM | POA: Diagnosis not present

## 2017-10-14 DIAGNOSIS — M25662 Stiffness of left knee, not elsewhere classified: Secondary | ICD-10-CM | POA: Diagnosis not present

## 2017-10-14 DIAGNOSIS — Z96652 Presence of left artificial knee joint: Secondary | ICD-10-CM | POA: Diagnosis not present

## 2017-10-16 ENCOUNTER — Other Ambulatory Visit: Payer: Self-pay | Admitting: Family Medicine

## 2017-10-16 DIAGNOSIS — Z96652 Presence of left artificial knee joint: Secondary | ICD-10-CM | POA: Diagnosis not present

## 2017-10-16 DIAGNOSIS — M25662 Stiffness of left knee, not elsewhere classified: Secondary | ICD-10-CM | POA: Diagnosis not present

## 2017-10-16 DIAGNOSIS — Z471 Aftercare following joint replacement surgery: Secondary | ICD-10-CM | POA: Diagnosis not present

## 2017-10-16 DIAGNOSIS — R2689 Other abnormalities of gait and mobility: Secondary | ICD-10-CM | POA: Diagnosis not present

## 2017-10-16 DIAGNOSIS — M6281 Muscle weakness (generalized): Secondary | ICD-10-CM | POA: Diagnosis not present

## 2017-10-17 DIAGNOSIS — H04123 Dry eye syndrome of bilateral lacrimal glands: Secondary | ICD-10-CM | POA: Diagnosis not present

## 2017-10-17 DIAGNOSIS — H40033 Anatomical narrow angle, bilateral: Secondary | ICD-10-CM | POA: Diagnosis not present

## 2017-10-21 DIAGNOSIS — Z471 Aftercare following joint replacement surgery: Secondary | ICD-10-CM | POA: Diagnosis not present

## 2017-10-21 DIAGNOSIS — M6281 Muscle weakness (generalized): Secondary | ICD-10-CM | POA: Diagnosis not present

## 2017-10-21 DIAGNOSIS — Z96652 Presence of left artificial knee joint: Secondary | ICD-10-CM | POA: Diagnosis not present

## 2017-10-21 DIAGNOSIS — R2689 Other abnormalities of gait and mobility: Secondary | ICD-10-CM | POA: Diagnosis not present

## 2017-10-21 DIAGNOSIS — M25662 Stiffness of left knee, not elsewhere classified: Secondary | ICD-10-CM | POA: Diagnosis not present

## 2017-10-23 ENCOUNTER — Ambulatory Visit (INDEPENDENT_AMBULATORY_CARE_PROVIDER_SITE_OTHER): Payer: Medicare HMO | Admitting: Orthopaedic Surgery

## 2017-10-23 DIAGNOSIS — M6281 Muscle weakness (generalized): Secondary | ICD-10-CM | POA: Diagnosis not present

## 2017-10-23 DIAGNOSIS — R2689 Other abnormalities of gait and mobility: Secondary | ICD-10-CM | POA: Diagnosis not present

## 2017-10-23 DIAGNOSIS — M25662 Stiffness of left knee, not elsewhere classified: Secondary | ICD-10-CM | POA: Diagnosis not present

## 2017-10-23 DIAGNOSIS — Z471 Aftercare following joint replacement surgery: Secondary | ICD-10-CM | POA: Diagnosis not present

## 2017-10-23 DIAGNOSIS — Z96652 Presence of left artificial knee joint: Secondary | ICD-10-CM | POA: Diagnosis not present

## 2017-10-28 DIAGNOSIS — Z471 Aftercare following joint replacement surgery: Secondary | ICD-10-CM | POA: Diagnosis not present

## 2017-10-28 DIAGNOSIS — Z96652 Presence of left artificial knee joint: Secondary | ICD-10-CM | POA: Diagnosis not present

## 2017-10-28 DIAGNOSIS — R2689 Other abnormalities of gait and mobility: Secondary | ICD-10-CM | POA: Diagnosis not present

## 2017-10-28 DIAGNOSIS — M25662 Stiffness of left knee, not elsewhere classified: Secondary | ICD-10-CM | POA: Diagnosis not present

## 2017-10-28 DIAGNOSIS — M6281 Muscle weakness (generalized): Secondary | ICD-10-CM | POA: Diagnosis not present

## 2017-10-31 ENCOUNTER — Ambulatory Visit: Payer: Medicare HMO | Admitting: Urology

## 2017-10-31 DIAGNOSIS — N403 Nodular prostate with lower urinary tract symptoms: Secondary | ICD-10-CM | POA: Diagnosis not present

## 2017-10-31 DIAGNOSIS — N5201 Erectile dysfunction due to arterial insufficiency: Secondary | ICD-10-CM

## 2017-10-31 DIAGNOSIS — N3944 Nocturnal enuresis: Secondary | ICD-10-CM

## 2017-11-04 ENCOUNTER — Other Ambulatory Visit: Payer: Self-pay | Admitting: Urology

## 2017-11-04 DIAGNOSIS — Z96652 Presence of left artificial knee joint: Secondary | ICD-10-CM | POA: Diagnosis not present

## 2017-11-04 DIAGNOSIS — N403 Nodular prostate with lower urinary tract symptoms: Secondary | ICD-10-CM

## 2017-11-04 DIAGNOSIS — M25662 Stiffness of left knee, not elsewhere classified: Secondary | ICD-10-CM | POA: Diagnosis not present

## 2017-11-04 DIAGNOSIS — R2689 Other abnormalities of gait and mobility: Secondary | ICD-10-CM | POA: Diagnosis not present

## 2017-11-04 DIAGNOSIS — Z471 Aftercare following joint replacement surgery: Secondary | ICD-10-CM | POA: Diagnosis not present

## 2017-11-04 DIAGNOSIS — M6281 Muscle weakness (generalized): Secondary | ICD-10-CM | POA: Diagnosis not present

## 2017-11-07 DIAGNOSIS — T8484XA Pain due to internal orthopedic prosthetic devices, implants and grafts, initial encounter: Secondary | ICD-10-CM | POA: Diagnosis not present

## 2017-11-07 DIAGNOSIS — M5432 Sciatica, left side: Secondary | ICD-10-CM | POA: Diagnosis not present

## 2017-11-07 DIAGNOSIS — Z96652 Presence of left artificial knee joint: Secondary | ICD-10-CM | POA: Diagnosis not present

## 2017-11-07 DIAGNOSIS — M24662 Ankylosis, left knee: Secondary | ICD-10-CM | POA: Diagnosis not present

## 2017-11-11 DIAGNOSIS — M545 Low back pain: Secondary | ICD-10-CM | POA: Diagnosis not present

## 2017-11-13 DIAGNOSIS — M545 Low back pain: Secondary | ICD-10-CM | POA: Diagnosis not present

## 2017-11-18 DIAGNOSIS — M545 Low back pain: Secondary | ICD-10-CM | POA: Diagnosis not present

## 2017-11-20 DIAGNOSIS — M545 Low back pain: Secondary | ICD-10-CM | POA: Diagnosis not present

## 2017-11-21 ENCOUNTER — Ambulatory Visit (HOSPITAL_COMMUNITY)
Admission: RE | Admit: 2017-11-21 | Discharge: 2017-11-21 | Disposition: A | Discharge status: Home / Self Care | Payer: Medicare HMO | Source: Ambulatory Visit | Attending: Urology | Admitting: Urology

## 2017-11-21 DIAGNOSIS — N403 Nodular prostate with lower urinary tract symptoms: Secondary | ICD-10-CM | POA: Diagnosis not present

## 2017-11-21 DIAGNOSIS — N402 Nodular prostate without lower urinary tract symptoms: Secondary | ICD-10-CM | POA: Diagnosis not present

## 2017-11-21 MED ORDER — LIDOCAINE HCL (PF) 1 % IJ SOLN
5.0000 mL | Freq: Once | INTRAMUSCULAR | Status: AC
  Administered 2017-11-21: 2.1 mL

## 2017-11-21 MED ORDER — LIDOCAINE HCL (PF) 2 % IJ SOLN
10.0000 mL | Freq: Once | INTRAMUSCULAR | Status: AC
  Administered 2017-11-21: 10 mL

## 2017-11-21 MED ORDER — LIDOCAINE HCL (PF) 2 % IJ SOLN
INTRAMUSCULAR | Status: AC
  Administered 2017-11-21: 10 mL
  Filled 2017-11-21: qty 10

## 2017-11-21 MED ORDER — CEFTRIAXONE SODIUM 1 G IJ SOLR
1.0000 g | Freq: Once | INTRAMUSCULAR | Status: AC
  Administered 2017-11-21: 1 g via INTRAMUSCULAR

## 2017-11-21 MED ORDER — LIDOCAINE HCL (PF) 1 % IJ SOLN
INTRAMUSCULAR | Status: AC
  Administered 2017-11-21: 2.1 mL
  Filled 2017-11-21: qty 5

## 2017-11-21 MED ORDER — CEFTRIAXONE SODIUM 1 G IJ SOLR
INTRAMUSCULAR | Status: AC
  Administered 2017-11-21: 1 g via INTRAMUSCULAR
  Filled 2017-11-21: qty 10

## 2017-11-21 NOTE — Consent Form (Signed)
CLINICAL DATA:    Ultrasound was provided for use by the ordering physician, and a technical  charge was applied by the performing facility.  No radiologist  interpretation/professional services rendered.   

## 2017-11-25 DIAGNOSIS — M545 Low back pain: Secondary | ICD-10-CM | POA: Diagnosis not present

## 2017-11-27 DIAGNOSIS — M545 Low back pain: Secondary | ICD-10-CM | POA: Diagnosis not present

## 2017-12-02 DIAGNOSIS — M545 Low back pain: Secondary | ICD-10-CM | POA: Diagnosis not present

## 2017-12-03 DIAGNOSIS — M545 Low back pain: Secondary | ICD-10-CM | POA: Diagnosis not present

## 2017-12-09 DIAGNOSIS — M545 Low back pain: Secondary | ICD-10-CM | POA: Diagnosis not present

## 2017-12-11 DIAGNOSIS — M545 Low back pain: Secondary | ICD-10-CM | POA: Diagnosis not present

## 2017-12-16 ENCOUNTER — Other Ambulatory Visit (HOSPITAL_COMMUNITY): Payer: Self-pay | Admitting: Orthopedic Surgery

## 2017-12-16 DIAGNOSIS — M4726 Other spondylosis with radiculopathy, lumbar region: Secondary | ICD-10-CM | POA: Diagnosis not present

## 2017-12-16 DIAGNOSIS — M4316 Spondylolisthesis, lumbar region: Secondary | ICD-10-CM | POA: Diagnosis not present

## 2017-12-16 DIAGNOSIS — M5386 Other specified dorsopathies, lumbar region: Secondary | ICD-10-CM | POA: Diagnosis not present

## 2017-12-16 DIAGNOSIS — I7090 Unspecified atherosclerosis: Secondary | ICD-10-CM | POA: Diagnosis not present

## 2017-12-16 DIAGNOSIS — M5432 Sciatica, left side: Secondary | ICD-10-CM | POA: Diagnosis not present

## 2017-12-16 DIAGNOSIS — M5136 Other intervertebral disc degeneration, lumbar region: Secondary | ICD-10-CM | POA: Diagnosis not present

## 2017-12-16 DIAGNOSIS — R200 Anesthesia of skin: Secondary | ICD-10-CM | POA: Diagnosis not present

## 2017-12-16 DIAGNOSIS — M25562 Pain in left knee: Secondary | ICD-10-CM | POA: Diagnosis not present

## 2017-12-16 DIAGNOSIS — M47816 Spondylosis without myelopathy or radiculopathy, lumbar region: Secondary | ICD-10-CM | POA: Diagnosis not present

## 2017-12-17 DIAGNOSIS — M545 Low back pain: Secondary | ICD-10-CM | POA: Diagnosis not present

## 2017-12-18 DIAGNOSIS — M545 Low back pain: Secondary | ICD-10-CM | POA: Diagnosis not present

## 2017-12-23 ENCOUNTER — Ambulatory Visit (HOSPITAL_COMMUNITY)
Admission: RE | Admit: 2017-12-23 | Discharge: 2017-12-23 | Disposition: A | Discharge status: Home / Self Care | Payer: Medicare HMO | Source: Ambulatory Visit | Attending: Orthopedic Surgery | Admitting: Orthopedic Surgery

## 2017-12-23 DIAGNOSIS — M4800 Spinal stenosis, site unspecified: Secondary | ICD-10-CM | POA: Diagnosis not present

## 2017-12-23 DIAGNOSIS — M5136 Other intervertebral disc degeneration, lumbar region: Secondary | ICD-10-CM | POA: Insufficient documentation

## 2017-12-23 DIAGNOSIS — M48061 Spinal stenosis, lumbar region without neurogenic claudication: Secondary | ICD-10-CM | POA: Insufficient documentation

## 2017-12-23 DIAGNOSIS — M5126 Other intervertebral disc displacement, lumbar region: Secondary | ICD-10-CM | POA: Diagnosis not present

## 2018-01-09 DIAGNOSIS — M5136 Other intervertebral disc degeneration, lumbar region: Secondary | ICD-10-CM | POA: Diagnosis not present

## 2018-01-09 DIAGNOSIS — Z471 Aftercare following joint replacement surgery: Secondary | ICD-10-CM | POA: Diagnosis not present

## 2018-01-09 DIAGNOSIS — M48061 Spinal stenosis, lumbar region without neurogenic claudication: Secondary | ICD-10-CM | POA: Diagnosis not present

## 2018-01-09 DIAGNOSIS — M5432 Sciatica, left side: Secondary | ICD-10-CM | POA: Diagnosis not present

## 2018-01-09 DIAGNOSIS — Z96652 Presence of left artificial knee joint: Secondary | ICD-10-CM | POA: Diagnosis not present

## 2018-01-09 DIAGNOSIS — M47816 Spondylosis without myelopathy or radiculopathy, lumbar region: Secondary | ICD-10-CM | POA: Diagnosis not present

## 2018-01-09 DIAGNOSIS — R200 Anesthesia of skin: Secondary | ICD-10-CM | POA: Diagnosis not present

## 2018-01-09 DIAGNOSIS — M25562 Pain in left knee: Secondary | ICD-10-CM | POA: Diagnosis not present

## 2018-01-09 DIAGNOSIS — Z888 Allergy status to other drugs, medicaments and biological substances status: Secondary | ICD-10-CM | POA: Diagnosis not present

## 2018-01-09 DIAGNOSIS — M24662 Ankylosis, left knee: Secondary | ICD-10-CM | POA: Diagnosis not present

## 2018-01-13 ENCOUNTER — Other Ambulatory Visit: Payer: Self-pay | Admitting: *Deleted

## 2018-01-13 MED ORDER — TAMSULOSIN HCL 0.4 MG PO CAPS: ORAL_CAPSULE | ORAL | 0 refills | Status: DC

## 2018-01-20 ENCOUNTER — Ambulatory Visit: Payer: Medicare HMO | Admitting: Family Medicine

## 2018-01-23 ENCOUNTER — Ambulatory Visit (INDEPENDENT_AMBULATORY_CARE_PROVIDER_SITE_OTHER): Payer: Medicare HMO | Admitting: Family Medicine

## 2018-01-23 ENCOUNTER — Encounter: Payer: Self-pay | Admitting: Family Medicine

## 2018-01-23 VITALS — BP 95/56 | HR 69 | Temp 97.7°F | Ht 71.5 in | Wt 185.0 lb

## 2018-01-23 DIAGNOSIS — E782 Mixed hyperlipidemia: Secondary | ICD-10-CM

## 2018-01-23 DIAGNOSIS — E663 Overweight: Secondary | ICD-10-CM | POA: Insufficient documentation

## 2018-01-23 DIAGNOSIS — N3281 Overactive bladder: Secondary | ICD-10-CM

## 2018-01-23 NOTE — Progress Notes (Signed)
BP (!) 95/56 (BP Location: Left Arm)   Pulse 69   Temp 97.7 F (36.5 C) (Oral)   Ht 5' 11.5" (1.816 m)   Wt 185 lb (83.9 kg)   BMI 25.44 kg/m    Subjective:    Patient ID: Terry Sloan, male    DOB: 1940-12-31, 77 y.o.   MRN: [id number]  HPI: Terry Sloan is a 77 y.o. male presenting on 01/23/2018 for Hyperlipidemia (4 mo / BPH - seen urology) and Knee Pain (went to ortho )   HPI Hyperlipidemia Patient is coming in for recheck of his hyperlipidemia. The patient is currently taking pravastatin. They deny any issues with myalgias or history of liver damage from it. They deny any focal numbness or weakness or chest pain.   Overactive bladder and BPH Patient had previously been on tamsulosin for his prostate and he had been doing well but then he was having too much urination at night and so they decided the put him at his urology office on oxybutynin but since he has been having the oxybutynin he is been having orthostatic lightheadedness and dizziness and his blood pressure today in the office is 95/56.  He is not on any other medications besides these 2 that would lower this.  He says symptomatically is doing well so would like to continue with the medication but also he understands concerning the lightheadedness and dizziness.  Relevant past medical, surgical, family and social history reviewed and updated as indicated. Interim medical history since our last visit reviewed. Allergies and medications reviewed and updated.  Review of Systems  Constitutional: Negative for chills and fever.  Eyes: Negative for visual disturbance.  Respiratory: Negative for shortness of breath and wheezing.   Cardiovascular: Negative for chest pain and leg swelling.  Genitourinary: Positive for frequency and urgency. Negative for decreased urine volume, difficulty urinating, dysuria and hematuria.  Musculoskeletal: Negative for back pain and gait problem.  Skin: Negative for rash.  Neurological: Positive  for dizziness and light-headedness. Negative for weakness, numbness and headaches.  All other systems reviewed and are negative.   Per HPI unless specifically indicated above   Allergies as of 01/23/2018      Reactions   Celebrex [celecoxib] Other (See Comments)   Leg swelling, broke out in rash      Medication List        Accurate as of 01/23/18  3:35 PM. Always use your most recent med list.          naproxen 500 MG tablet Commonly known as:  NAPROSYN Take 1 tablet (500 mg total) by mouth 2 (two) times daily with a meal.   oxybutynin 5 MG tablet Commonly known as:  DITROPAN Take 1 tablet by mouth daily.   pravastatin 40 MG tablet Commonly known as:  PRAVACHOL Take 1 tablet (40 mg total) by mouth daily.   tamsulosin 0.4 MG Caps capsule Commonly known as:  FLOMAX TAKE ONE (1) CAPSULE EACH DAY          Objective:    BP (!) 95/56 (BP Location: Left Arm)   Pulse 69   Temp 97.7 F (36.5 C) (Oral)   Ht 5' 11.5" (1.816 m)   Wt 185 lb (83.9 kg)   BMI 25.44 kg/m   Wt Readings from Last 3 Encounters:  01/23/18 185 lb (83.9 kg)  09/17/17 190 lb 6.4 oz (86.4 kg)  08/28/17 191 lb 3.2 oz (86.7 kg)    Physical Exam  Constitutional: He  is oriented to person, place, and time. He appears well-developed and well-nourished. No distress.  Eyes: Conjunctivae are normal. No scleral icterus.  Neck: Neck supple. No thyromegaly present.  Cardiovascular: Normal rate, regular rhythm, normal heart sounds and intact distal pulses.  No murmur heard. Pulmonary/Chest: Effort normal and breath sounds normal. No respiratory distress. He has no wheezes.  Musculoskeletal: Normal range of motion. He exhibits no edema.  Lymphadenopathy:    He has no cervical adenopathy.  Neurological: He is alert and oriented to person, place, and time. Coordination normal.  Skin: Skin is warm and dry. No rash noted. He is not diaphoretic.  Psychiatric: He has a normal mood and affect. His behavior is  normal.  Nursing note and vitals reviewed.       Assessment & Plan:   Problem List Items Addressed This Visit      Other   Mixed hyperlipidemia - Primary   Overweight (BMI 25.0-29.9)    Other Visit Diagnoses    Overactive bladder       Gave samples for Myrbetriq because he is been lightheaded and having orthostatic lightheadedness      Continue patient's Pravachol and Flomax, switch to oxybutynin for Myrbetriq samples, but does well will send a prescription for Myrbetriq Follow up plan: Return in about 6 months (around 07/26/2018), or if symptoms worsen or fail to improve, for Follow-up hyper lipidemia and bladder and prostate.  Counseling provided for all of the vaccine components No orders of the defined types were placed in this encounter.   Arville Care, MD [facility] 01/23/2018, 3:35 PM

## 2018-01-30 DIAGNOSIS — G6289 Other specified polyneuropathies: Secondary | ICD-10-CM | POA: Diagnosis not present

## 2018-01-30 DIAGNOSIS — R94131 Abnormal electromyogram [EMG]: Secondary | ICD-10-CM | POA: Diagnosis not present

## 2018-01-30 DIAGNOSIS — G589 Mononeuropathy, unspecified: Secondary | ICD-10-CM | POA: Diagnosis not present

## 2018-02-13 ENCOUNTER — Ambulatory Visit: Payer: Medicare HMO | Admitting: Urology

## 2018-02-13 DIAGNOSIS — N403 Nodular prostate with lower urinary tract symptoms: Secondary | ICD-10-CM | POA: Diagnosis not present

## 2018-02-13 DIAGNOSIS — N3944 Nocturnal enuresis: Secondary | ICD-10-CM | POA: Diagnosis not present

## 2018-02-13 DIAGNOSIS — N5201 Erectile dysfunction due to arterial insufficiency: Secondary | ICD-10-CM | POA: Diagnosis not present

## 2018-02-20 DIAGNOSIS — M25562 Pain in left knee: Secondary | ICD-10-CM | POA: Diagnosis not present

## 2018-02-20 DIAGNOSIS — G8929 Other chronic pain: Secondary | ICD-10-CM | POA: Diagnosis not present

## 2018-03-13 DIAGNOSIS — T8484XA Pain due to internal orthopedic prosthetic devices, implants and grafts, initial encounter: Secondary | ICD-10-CM | POA: Diagnosis not present

## 2018-03-13 DIAGNOSIS — Z96652 Presence of left artificial knee joint: Secondary | ICD-10-CM | POA: Diagnosis not present

## 2018-03-13 DIAGNOSIS — T8484XD Pain due to internal orthopedic prosthetic devices, implants and grafts, subsequent encounter: Secondary | ICD-10-CM | POA: Diagnosis not present

## 2018-03-26 DIAGNOSIS — T1511XA Foreign body in conjunctival sac, right eye, initial encounter: Secondary | ICD-10-CM | POA: Diagnosis not present

## 2018-03-26 DIAGNOSIS — H10013 Acute follicular conjunctivitis, bilateral: Secondary | ICD-10-CM | POA: Diagnosis not present

## 2018-04-03 DIAGNOSIS — Z96652 Presence of left artificial knee joint: Secondary | ICD-10-CM | POA: Diagnosis not present

## 2018-04-03 DIAGNOSIS — M1711 Unilateral primary osteoarthritis, right knee: Secondary | ICD-10-CM | POA: Diagnosis not present

## 2018-04-03 DIAGNOSIS — T8484XD Pain due to internal orthopedic prosthetic devices, implants and grafts, subsequent encounter: Secondary | ICD-10-CM | POA: Diagnosis not present

## 2018-05-01 ENCOUNTER — Other Ambulatory Visit: Payer: Self-pay | Admitting: *Deleted

## 2018-05-01 MED ORDER — PRAVASTATIN SODIUM 40 MG PO TABS: 40.0000 mg | ORAL_TABLET | Freq: Every day | ORAL | 1 refills | Status: DC

## 2018-05-04 ENCOUNTER — Other Ambulatory Visit: Payer: Self-pay | Admitting: Family

## 2018-06-26 DIAGNOSIS — Z8673 Personal history of transient ischemic attack (TIA), and cerebral infarction without residual deficits: Secondary | ICD-10-CM | POA: Diagnosis not present

## 2018-06-26 DIAGNOSIS — M85862 Other specified disorders of bone density and structure, left lower leg: Secondary | ICD-10-CM | POA: Diagnosis not present

## 2018-06-26 DIAGNOSIS — I10 Essential (primary) hypertension: Secondary | ICD-10-CM | POA: Diagnosis not present

## 2018-06-26 DIAGNOSIS — T8484XA Pain due to internal orthopedic prosthetic devices, implants and grafts, initial encounter: Secondary | ICD-10-CM | POA: Diagnosis not present

## 2018-06-26 DIAGNOSIS — T84033A Mechanical loosening of internal left knee prosthetic joint, initial encounter: Secondary | ICD-10-CM | POA: Diagnosis not present

## 2018-06-26 DIAGNOSIS — M24662 Ankylosis, left knee: Secondary | ICD-10-CM | POA: Diagnosis not present

## 2018-06-26 DIAGNOSIS — Z01818 Encounter for other preprocedural examination: Secondary | ICD-10-CM | POA: Diagnosis not present

## 2018-06-26 DIAGNOSIS — E782 Mixed hyperlipidemia: Secondary | ICD-10-CM | POA: Diagnosis not present

## 2018-06-26 DIAGNOSIS — I358 Other nonrheumatic aortic valve disorders: Secondary | ICD-10-CM | POA: Diagnosis not present

## 2018-06-26 DIAGNOSIS — Z833 Family history of diabetes mellitus: Secondary | ICD-10-CM | POA: Diagnosis not present

## 2018-06-26 DIAGNOSIS — F419 Anxiety disorder, unspecified: Secondary | ICD-10-CM | POA: Diagnosis not present

## 2018-06-26 DIAGNOSIS — I351 Nonrheumatic aortic (valve) insufficiency: Secondary | ICD-10-CM | POA: Diagnosis not present

## 2018-06-26 DIAGNOSIS — S82292D Other fracture of shaft of left tibia, subsequent encounter for closed fracture with routine healing: Secondary | ICD-10-CM | POA: Diagnosis not present

## 2018-06-26 DIAGNOSIS — S82832D Other fracture of upper and lower end of left fibula, subsequent encounter for closed fracture with routine healing: Secondary | ICD-10-CM | POA: Diagnosis not present

## 2018-06-26 DIAGNOSIS — I739 Peripheral vascular disease, unspecified: Secondary | ICD-10-CM | POA: Diagnosis not present

## 2018-06-26 DIAGNOSIS — Z96652 Presence of left artificial knee joint: Secondary | ICD-10-CM | POA: Diagnosis not present

## 2018-06-26 DIAGNOSIS — I071 Rheumatic tricuspid insufficiency: Secondary | ICD-10-CM | POA: Diagnosis not present

## 2018-06-26 DIAGNOSIS — I77810 Thoracic aortic ectasia: Secondary | ICD-10-CM | POA: Diagnosis not present

## 2018-06-26 DIAGNOSIS — F329 Major depressive disorder, single episode, unspecified: Secondary | ICD-10-CM | POA: Diagnosis not present

## 2018-07-14 DIAGNOSIS — F329 Major depressive disorder, single episode, unspecified: Secondary | ICD-10-CM | POA: Diagnosis not present

## 2018-07-14 DIAGNOSIS — I739 Peripheral vascular disease, unspecified: Secondary | ICD-10-CM | POA: Diagnosis not present

## 2018-07-14 DIAGNOSIS — F419 Anxiety disorder, unspecified: Secondary | ICD-10-CM | POA: Diagnosis not present

## 2018-07-14 DIAGNOSIS — Z8673 Personal history of transient ischemic attack (TIA), and cerebral infarction without residual deficits: Secondary | ICD-10-CM | POA: Diagnosis not present

## 2018-07-14 DIAGNOSIS — E782 Mixed hyperlipidemia: Secondary | ICD-10-CM | POA: Diagnosis not present

## 2018-07-14 DIAGNOSIS — Z833 Family history of diabetes mellitus: Secondary | ICD-10-CM | POA: Diagnosis not present

## 2018-07-14 DIAGNOSIS — G8918 Other acute postprocedural pain: Secondary | ICD-10-CM | POA: Diagnosis not present

## 2018-07-14 DIAGNOSIS — Z96652 Presence of left artificial knee joint: Secondary | ICD-10-CM | POA: Diagnosis not present

## 2018-07-14 DIAGNOSIS — T84033A Mechanical loosening of internal left knee prosthetic joint, initial encounter: Secondary | ICD-10-CM | POA: Diagnosis not present

## 2018-07-14 DIAGNOSIS — Z471 Aftercare following joint replacement surgery: Secondary | ICD-10-CM | POA: Diagnosis not present

## 2018-07-14 DIAGNOSIS — I10 Essential (primary) hypertension: Secondary | ICD-10-CM | POA: Diagnosis not present

## 2018-07-14 DIAGNOSIS — T8484XA Pain due to internal orthopedic prosthetic devices, implants and grafts, initial encounter: Secondary | ICD-10-CM | POA: Diagnosis not present

## 2018-07-14 HISTORY — PX: REPLACEMENT TOTAL KNEE: SUR1224

## 2018-07-21 DIAGNOSIS — M25662 Stiffness of left knee, not elsewhere classified: Secondary | ICD-10-CM | POA: Diagnosis not present

## 2018-07-21 DIAGNOSIS — M25562 Pain in left knee: Secondary | ICD-10-CM | POA: Diagnosis not present

## 2018-07-21 DIAGNOSIS — R2689 Other abnormalities of gait and mobility: Secondary | ICD-10-CM | POA: Diagnosis not present

## 2018-07-21 DIAGNOSIS — Z471 Aftercare following joint replacement surgery: Secondary | ICD-10-CM | POA: Diagnosis not present

## 2018-07-21 DIAGNOSIS — Z96652 Presence of left artificial knee joint: Secondary | ICD-10-CM | POA: Diagnosis not present

## 2018-07-21 DIAGNOSIS — M6281 Muscle weakness (generalized): Secondary | ICD-10-CM | POA: Diagnosis not present

## 2018-07-22 DIAGNOSIS — M25562 Pain in left knee: Secondary | ICD-10-CM | POA: Diagnosis not present

## 2018-07-22 DIAGNOSIS — M25662 Stiffness of left knee, not elsewhere classified: Secondary | ICD-10-CM | POA: Diagnosis not present

## 2018-07-22 DIAGNOSIS — Z96652 Presence of left artificial knee joint: Secondary | ICD-10-CM | POA: Diagnosis not present

## 2018-07-22 DIAGNOSIS — Z471 Aftercare following joint replacement surgery: Secondary | ICD-10-CM | POA: Diagnosis not present

## 2018-07-22 DIAGNOSIS — M6281 Muscle weakness (generalized): Secondary | ICD-10-CM | POA: Diagnosis not present

## 2018-07-22 DIAGNOSIS — R2689 Other abnormalities of gait and mobility: Secondary | ICD-10-CM | POA: Diagnosis not present

## 2018-07-24 DIAGNOSIS — M25562 Pain in left knee: Secondary | ICD-10-CM | POA: Diagnosis not present

## 2018-07-24 DIAGNOSIS — R2689 Other abnormalities of gait and mobility: Secondary | ICD-10-CM | POA: Diagnosis not present

## 2018-07-24 DIAGNOSIS — M25662 Stiffness of left knee, not elsewhere classified: Secondary | ICD-10-CM | POA: Diagnosis not present

## 2018-07-24 DIAGNOSIS — Z96652 Presence of left artificial knee joint: Secondary | ICD-10-CM | POA: Diagnosis not present

## 2018-07-24 DIAGNOSIS — M6281 Muscle weakness (generalized): Secondary | ICD-10-CM | POA: Diagnosis not present

## 2018-07-24 DIAGNOSIS — Z471 Aftercare following joint replacement surgery: Secondary | ICD-10-CM | POA: Diagnosis not present

## 2018-07-27 ENCOUNTER — Encounter: Payer: Self-pay | Admitting: Family Medicine

## 2018-07-27 ENCOUNTER — Ambulatory Visit (INDEPENDENT_AMBULATORY_CARE_PROVIDER_SITE_OTHER): Payer: Medicare HMO | Admitting: Family Medicine

## 2018-07-27 VITALS — BP 106/66 | HR 81 | Temp 97.5°F | Ht 71.5 in | Wt 188.4 lb

## 2018-07-27 DIAGNOSIS — M6281 Muscle weakness (generalized): Secondary | ICD-10-CM | POA: Diagnosis not present

## 2018-07-27 DIAGNOSIS — E663 Overweight: Secondary | ICD-10-CM | POA: Diagnosis not present

## 2018-07-27 DIAGNOSIS — Z471 Aftercare following joint replacement surgery: Secondary | ICD-10-CM | POA: Diagnosis not present

## 2018-07-27 DIAGNOSIS — M25562 Pain in left knee: Secondary | ICD-10-CM | POA: Diagnosis not present

## 2018-07-27 DIAGNOSIS — N401 Enlarged prostate with lower urinary tract symptoms: Secondary | ICD-10-CM | POA: Diagnosis not present

## 2018-07-27 DIAGNOSIS — Z96652 Presence of left artificial knee joint: Secondary | ICD-10-CM | POA: Diagnosis not present

## 2018-07-27 DIAGNOSIS — M25662 Stiffness of left knee, not elsewhere classified: Secondary | ICD-10-CM | POA: Diagnosis not present

## 2018-07-27 DIAGNOSIS — R2689 Other abnormalities of gait and mobility: Secondary | ICD-10-CM | POA: Diagnosis not present

## 2018-07-27 DIAGNOSIS — E782 Mixed hyperlipidemia: Secondary | ICD-10-CM | POA: Diagnosis not present

## 2018-07-27 DIAGNOSIS — N400 Enlarged prostate without lower urinary tract symptoms: Secondary | ICD-10-CM | POA: Insufficient documentation

## 2018-07-27 NOTE — Progress Notes (Signed)
BP 106/66   Pulse 81   Temp (!) 97.5 F (36.4 C) (Oral)   Ht 5' 11.5" (1.816 m)   Wt 188 lb 6.4 oz (85.5 kg)   BMI 25.91 kg/m    Subjective:    Patient ID: Terry Sloan, male    DOB: [birth year], 78 y.o.   MRN: [id number]  HPI: Terry Sloan is a 78 y.o. male presenting on 07/27/2018 for Hyperlipidemia (6 month follow up) and Knee Pain (Left. Patient had knee replacement 2/11)   HPI Hyperlipidemia Patient is coming in for recheck of his hyperlipidemia. The patient is currently taking alcohol. They deny any issues with myalgias or history of liver damage from it. They deny any focal numbness or weakness or chest pain.   BPH recheck Patient has a history of BPH and overactive bladder.  He currently takes Flomax and Ditropan for this and they are working well for him and is happy with it.  He denies any burning or blood in his urine or pain with urination.  Patient had a left knee replacement just recently and is still having some pain with that, he was instructed to go back and discuss with orthopedic and to take Tylenol as needed.  Relevant past medical, surgical, family and social history reviewed and updated as indicated. Interim medical history since our last visit reviewed. Allergies and medications reviewed and updated.  Review of Systems  Constitutional: Negative for chills and fever.  Eyes: Negative for discharge.  Respiratory: Negative for shortness of breath and wheezing.   Cardiovascular: Negative for chest pain and leg swelling.  Genitourinary: Negative for decreased urine volume, difficulty urinating, enuresis, frequency and urgency.  Musculoskeletal: Positive for arthralgias. Negative for back pain and gait problem.  Skin: Negative for rash.  Neurological: Negative for dizziness, weakness and numbness.  All other systems reviewed and are negative.   Per HPI unless specifically indicated above   Allergies as of 07/27/2018      Reactions   Celebrex [celecoxib]  Other (See Comments)   Leg swelling, broke out in rash      Medication List       Accurate as of July 27, 2018 11:59 PM. Always use your most recent med list.        enoxaparin 40 MG/0.4ML injection Commonly known as:  LOVENOX Inject into the skin.   oxybutynin 5 MG tablet Commonly known as:  DITROPAN Take 1 tablet by mouth daily.   oxyCODONE 5 MG immediate release tablet Commonly known as:  Oxy IR/ROXICODONE Take 1 tablet by mouth every 4 (four) hours as needed.   pravastatin 40 MG tablet Commonly known as:  PRAVACHOL Take 1 tablet (40 mg total) by mouth daily.   tamsulosin 0.4 MG Caps capsule Commonly known as:  FLOMAX TAKE ONE (1) CAPSULE EACH DAY          Objective:    BP 106/66   Pulse 81   Temp (!) 97.5 F (36.4 C) (Oral)   Ht 5' 11.5" (1.816 m)   Wt 188 lb 6.4 oz (85.5 kg)   BMI 25.91 kg/m   Wt Readings from Last 3 Encounters:  07/27/18 188 lb 6.4 oz (85.5 kg)  01/23/18 185 lb (83.9 kg)  09/17/17 190 lb 6.4 oz (86.4 kg)    Physical Exam Vitals signs and nursing note reviewed.  Constitutional:      General: He is not in acute distress.    Appearance: He is well-developed. He is not diaphoretic.  Eyes:     General: No scleral icterus.    Conjunctiva/sclera: Conjunctivae normal.  Neck:     Musculoskeletal: Neck supple.     Thyroid: No thyromegaly.  Cardiovascular:     Rate and Rhythm: Normal rate and regular rhythm.     Heart sounds: Normal heart sounds. No murmur.  Pulmonary:     Effort: Pulmonary effort is normal. No respiratory distress.     Breath sounds: Normal breath sounds. No wheezing.  Musculoskeletal: Normal range of motion.  Lymphadenopathy:     Cervical: No cervical adenopathy.  Skin:    General: Skin is warm and dry.     Findings: No rash.  Neurological:     Mental Status: He is alert and oriented to person, place, and time.     Coordination: Coordination normal.  Psychiatric:        Behavior: Behavior normal.          Assessment & Plan:   Problem List Items Addressed This Visit      Genitourinary   BPH (benign prostatic hyperplasia)   Relevant Orders   CBC with Differential/Platelet (Completed)   CMP14+EGFR (Completed)   PSA, total and free (Completed)     Other   Mixed hyperlipidemia - Primary   Relevant Orders   CBC with Differential/Platelet (Completed)   CMP14+EGFR (Completed)   Lipid panel (Completed)   History of total left knee replacement   Relevant Orders   CMP14+EGFR (Completed)   Overweight (BMI 25.0-29.9)   Relevant Orders   CBC with Differential/Platelet (Completed)   CMP14+EGFR (Completed)   Lipid panel (Completed)      Will check blood work and continue current medications, follow-up in 6 months Follow up plan: Return in about 6 months (around 01/25/2019) for BPH and cholesterol.  Counseling provided for all of the vaccine components Orders Placed This Encounter  Procedures  . CBC with Differential/Platelet  . CMP14+EGFR  . Lipid panel  . PSA, total and free     , MD [facility] 08/02/2018, 11:04 AM     

## 2018-07-28 LAB — CMP14+EGFR
A/G RATIO: 1.8 (ref 1.2–2.2)
ALBUMIN: 4 g/dL (ref 3.7–4.7)
ALT: 47 IU/L — ABNORMAL HIGH (ref 0–44)
AST: 32 IU/L (ref 0–40)
Alkaline Phosphatase: 231 IU/L — ABNORMAL HIGH (ref 39–117)
BUN/Creatinine Ratio: 8 — ABNORMAL LOW (ref 10–24)
BUN: 13 mg/dL (ref 8–27)
Bilirubin Total: 0.9 mg/dL (ref 0.0–1.2)
CALCIUM: 8.9 mg/dL (ref 8.6–10.2)
CO2: 24 mmol/L (ref 20–29)
CREATININE: 1.53 mg/dL — AB (ref 0.76–1.27)
Chloride: 102 mmol/L (ref 96–106)
GFR calc Af Amer: 49 mL/min/{1.73_m2} — ABNORMAL LOW (ref >59)
GFR, EST NON AFRICAN AMERICAN: 43 mL/min/{1.73_m2} — AB (ref >59)
GLOBULIN, TOTAL: 2.2 g/dL (ref 1.5–4.5)
Glucose: 109 mg/dL — ABNORMAL HIGH (ref 65–99)
POTASSIUM: 4.2 mmol/L (ref 3.5–5.2)
SODIUM: 140 mmol/L (ref 134–144)
Total Protein: 6.2 g/dL (ref 6.0–8.5)

## 2018-07-28 LAB — CBC WITH DIFFERENTIAL/PLATELET
BASOS: 1 %
Basophils Absolute: 0 10*3/uL (ref 0.0–0.2)
EOS (ABSOLUTE): 0.2 10*3/uL (ref 0.0–0.4)
EOS: 3 %
HEMOGLOBIN: 11.5 g/dL — AB (ref 13.0–17.7)
Hematocrit: 33.7 % — ABNORMAL LOW (ref 37.5–51.0)
IMMATURE GRANULOCYTES: 0 %
Immature Grans (Abs): 0 10*3/uL (ref 0.0–0.1)
LYMPHS ABS: 1.3 10*3/uL (ref 0.7–3.1)
Lymphs: 25 %
MCH: 32.5 pg (ref 26.6–33.0)
MCHC: 34.1 g/dL (ref 31.5–35.7)
MCV: 95 fL (ref 79–97)
MONOCYTES: 14 %
MONOS ABS: 0.8 10*3/uL (ref 0.1–0.9)
Neutrophils Absolute: 3 10*3/uL (ref 1.4–7.0)
Neutrophils: 57 %
Platelets: 369 10*3/uL (ref 150–450)
RBC: 3.54 x10E6/uL — AB (ref 4.14–5.80)
RDW: 12.4 % (ref 11.6–15.4)
WBC: 5.2 10*3/uL (ref 3.4–10.8)

## 2018-07-28 LAB — LIPID PANEL
CHOL/HDL RATIO: 4.9 ratio (ref 0.0–5.0)
Cholesterol, Total: 166 mg/dL (ref 100–199)
HDL: 34 mg/dL — ABNORMAL LOW (ref >39)
LDL CALC: 111 mg/dL — AB (ref 0–99)
Triglycerides: 104 mg/dL (ref 0–149)
VLDL Cholesterol Cal: 21 mg/dL (ref 5–40)

## 2018-07-28 LAB — PSA, TOTAL AND FREE
PSA FREE PCT: 24.7 %
PSA FREE: 0.42 ng/mL
Prostate Specific Ag, Serum: 1.7 ng/mL (ref 0.0–4.0)

## 2018-07-29 DIAGNOSIS — M25562 Pain in left knee: Secondary | ICD-10-CM | POA: Diagnosis not present

## 2018-07-29 DIAGNOSIS — M6281 Muscle weakness (generalized): Secondary | ICD-10-CM | POA: Diagnosis not present

## 2018-07-29 DIAGNOSIS — R2689 Other abnormalities of gait and mobility: Secondary | ICD-10-CM | POA: Diagnosis not present

## 2018-07-29 DIAGNOSIS — Z471 Aftercare following joint replacement surgery: Secondary | ICD-10-CM | POA: Diagnosis not present

## 2018-07-29 DIAGNOSIS — M25662 Stiffness of left knee, not elsewhere classified: Secondary | ICD-10-CM | POA: Diagnosis not present

## 2018-07-29 DIAGNOSIS — Z96652 Presence of left artificial knee joint: Secondary | ICD-10-CM | POA: Diagnosis not present

## 2018-07-31 DIAGNOSIS — Z471 Aftercare following joint replacement surgery: Secondary | ICD-10-CM | POA: Diagnosis not present

## 2018-07-31 DIAGNOSIS — M6281 Muscle weakness (generalized): Secondary | ICD-10-CM | POA: Diagnosis not present

## 2018-07-31 DIAGNOSIS — R2689 Other abnormalities of gait and mobility: Secondary | ICD-10-CM | POA: Diagnosis not present

## 2018-07-31 DIAGNOSIS — M25662 Stiffness of left knee, not elsewhere classified: Secondary | ICD-10-CM | POA: Diagnosis not present

## 2018-07-31 DIAGNOSIS — M25562 Pain in left knee: Secondary | ICD-10-CM | POA: Diagnosis not present

## 2018-07-31 DIAGNOSIS — Z96652 Presence of left artificial knee joint: Secondary | ICD-10-CM | POA: Diagnosis not present

## 2018-08-03 DIAGNOSIS — Z96652 Presence of left artificial knee joint: Secondary | ICD-10-CM | POA: Diagnosis not present

## 2018-08-03 DIAGNOSIS — M25662 Stiffness of left knee, not elsewhere classified: Secondary | ICD-10-CM | POA: Diagnosis not present

## 2018-08-03 DIAGNOSIS — Z471 Aftercare following joint replacement surgery: Secondary | ICD-10-CM | POA: Diagnosis not present

## 2018-08-03 DIAGNOSIS — M25562 Pain in left knee: Secondary | ICD-10-CM | POA: Diagnosis not present

## 2018-08-03 DIAGNOSIS — R2689 Other abnormalities of gait and mobility: Secondary | ICD-10-CM | POA: Diagnosis not present

## 2018-08-03 DIAGNOSIS — M6281 Muscle weakness (generalized): Secondary | ICD-10-CM | POA: Diagnosis not present

## 2018-08-04 DIAGNOSIS — Z96652 Presence of left artificial knee joint: Secondary | ICD-10-CM | POA: Diagnosis not present

## 2018-08-04 DIAGNOSIS — Z471 Aftercare following joint replacement surgery: Secondary | ICD-10-CM | POA: Diagnosis not present

## 2018-08-05 ENCOUNTER — Other Ambulatory Visit: Payer: Self-pay | Admitting: Family Medicine

## 2018-08-05 DIAGNOSIS — M25662 Stiffness of left knee, not elsewhere classified: Secondary | ICD-10-CM | POA: Diagnosis not present

## 2018-08-05 DIAGNOSIS — R2689 Other abnormalities of gait and mobility: Secondary | ICD-10-CM | POA: Diagnosis not present

## 2018-08-05 DIAGNOSIS — Z471 Aftercare following joint replacement surgery: Secondary | ICD-10-CM | POA: Diagnosis not present

## 2018-08-05 DIAGNOSIS — Z96652 Presence of left artificial knee joint: Secondary | ICD-10-CM | POA: Diagnosis not present

## 2018-08-05 DIAGNOSIS — M25562 Pain in left knee: Secondary | ICD-10-CM | POA: Diagnosis not present

## 2018-08-05 DIAGNOSIS — M6281 Muscle weakness (generalized): Secondary | ICD-10-CM | POA: Diagnosis not present

## 2018-08-07 DIAGNOSIS — Z96652 Presence of left artificial knee joint: Secondary | ICD-10-CM | POA: Diagnosis not present

## 2018-08-07 DIAGNOSIS — Z471 Aftercare following joint replacement surgery: Secondary | ICD-10-CM | POA: Diagnosis not present

## 2018-08-07 DIAGNOSIS — M25562 Pain in left knee: Secondary | ICD-10-CM | POA: Diagnosis not present

## 2018-08-07 DIAGNOSIS — M6281 Muscle weakness (generalized): Secondary | ICD-10-CM | POA: Diagnosis not present

## 2018-08-07 DIAGNOSIS — R2689 Other abnormalities of gait and mobility: Secondary | ICD-10-CM | POA: Diagnosis not present

## 2018-08-07 DIAGNOSIS — M25662 Stiffness of left knee, not elsewhere classified: Secondary | ICD-10-CM | POA: Diagnosis not present

## 2018-08-10 DIAGNOSIS — R2689 Other abnormalities of gait and mobility: Secondary | ICD-10-CM | POA: Diagnosis not present

## 2018-08-10 DIAGNOSIS — Z96652 Presence of left artificial knee joint: Secondary | ICD-10-CM | POA: Diagnosis not present

## 2018-08-10 DIAGNOSIS — M25562 Pain in left knee: Secondary | ICD-10-CM | POA: Diagnosis not present

## 2018-08-10 DIAGNOSIS — M6281 Muscle weakness (generalized): Secondary | ICD-10-CM | POA: Diagnosis not present

## 2018-08-10 DIAGNOSIS — M25662 Stiffness of left knee, not elsewhere classified: Secondary | ICD-10-CM | POA: Diagnosis not present

## 2018-08-10 DIAGNOSIS — Z471 Aftercare following joint replacement surgery: Secondary | ICD-10-CM | POA: Diagnosis not present

## 2018-08-12 DIAGNOSIS — M25662 Stiffness of left knee, not elsewhere classified: Secondary | ICD-10-CM | POA: Diagnosis not present

## 2018-08-12 DIAGNOSIS — Z96652 Presence of left artificial knee joint: Secondary | ICD-10-CM | POA: Diagnosis not present

## 2018-08-12 DIAGNOSIS — M6281 Muscle weakness (generalized): Secondary | ICD-10-CM | POA: Diagnosis not present

## 2018-08-12 DIAGNOSIS — Z471 Aftercare following joint replacement surgery: Secondary | ICD-10-CM | POA: Diagnosis not present

## 2018-08-12 DIAGNOSIS — R2689 Other abnormalities of gait and mobility: Secondary | ICD-10-CM | POA: Diagnosis not present

## 2018-08-12 DIAGNOSIS — M25562 Pain in left knee: Secondary | ICD-10-CM | POA: Diagnosis not present

## 2018-08-14 DIAGNOSIS — M6281 Muscle weakness (generalized): Secondary | ICD-10-CM | POA: Diagnosis not present

## 2018-08-14 DIAGNOSIS — R2689 Other abnormalities of gait and mobility: Secondary | ICD-10-CM | POA: Diagnosis not present

## 2018-08-14 DIAGNOSIS — M25562 Pain in left knee: Secondary | ICD-10-CM | POA: Diagnosis not present

## 2018-08-14 DIAGNOSIS — M25662 Stiffness of left knee, not elsewhere classified: Secondary | ICD-10-CM | POA: Diagnosis not present

## 2018-08-14 DIAGNOSIS — Z471 Aftercare following joint replacement surgery: Secondary | ICD-10-CM | POA: Diagnosis not present

## 2018-08-14 DIAGNOSIS — Z96652 Presence of left artificial knee joint: Secondary | ICD-10-CM | POA: Diagnosis not present

## 2018-10-31 ENCOUNTER — Other Ambulatory Visit: Payer: Self-pay | Admitting: Family Medicine

## 2018-11-06 IMAGING — MR MR LUMBAR SPINE W/O CM
4 of 5 series · 15 of 48 positions shown · non-contrast
Comparison: Lumbar MRI 09/07/2007

CLINICAL DATA: Lumbar disc degeneration.  Low back pain

EXAM:
MRI LUMBAR SPINE WITHOUT CONTRAST
TECHNIQUE: Multiplanar, multisequence MR imaging of the lumbar spine was
performed. No intravenous contrast was administered.

[Series 3: T2 · sagittal · 4.0mm · 0.70mm/px · 6 of 15 slices shown (1 of 2)]
[im 1/15]
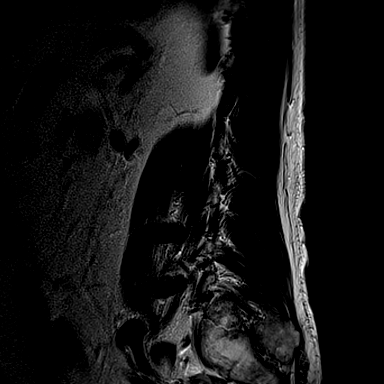
[im 3/15]
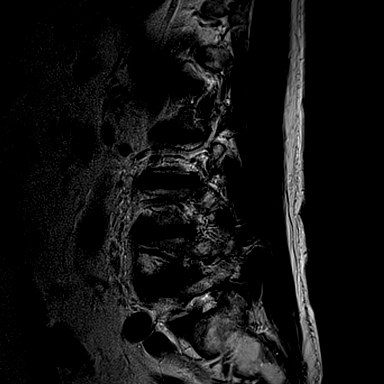
[im 6/15]
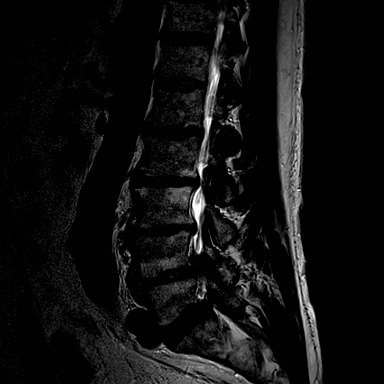
[im 9/15]
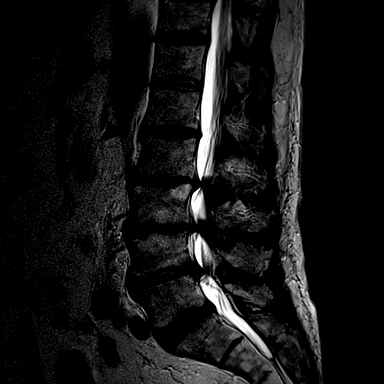
[im 12/15]
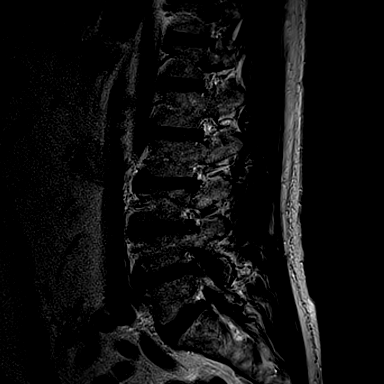
[im 15/15]
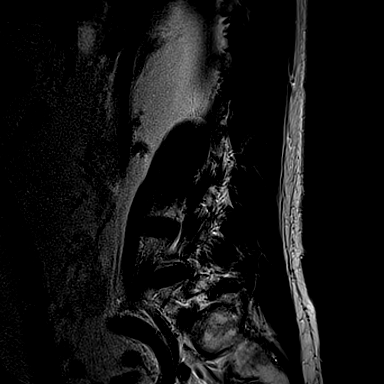

[Series 4: T1 · sagittal · 4.0mm · 0.35mm/px · 3 of 15 slices shown (1 of 2)]
[im 3/15]
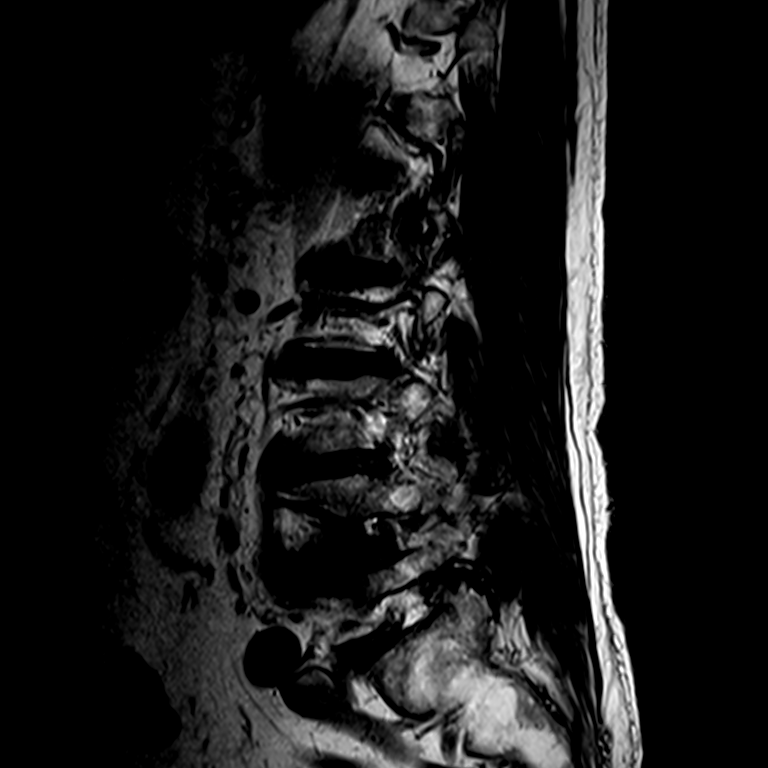
[im 9/15]
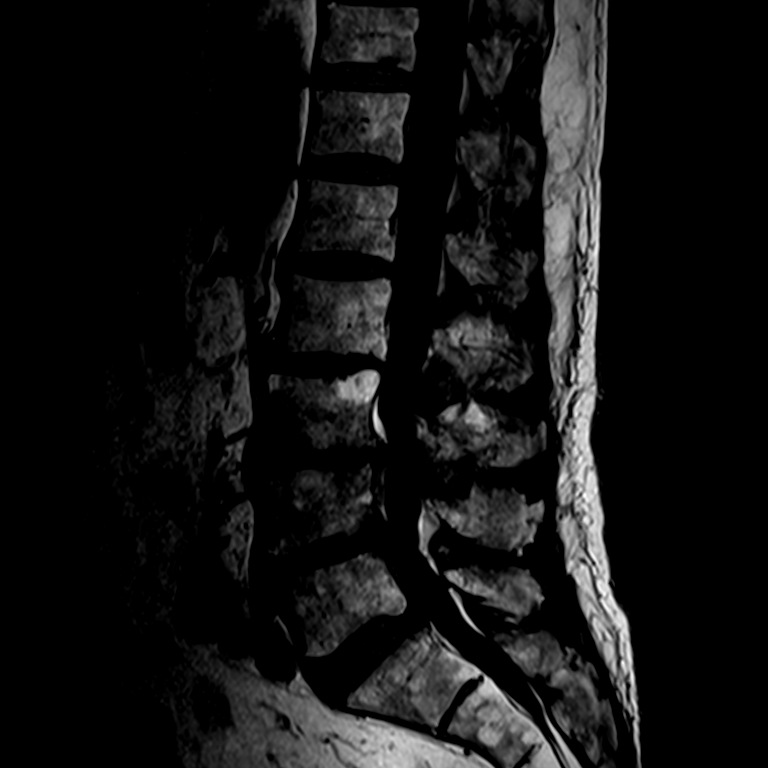
[im 15/15]
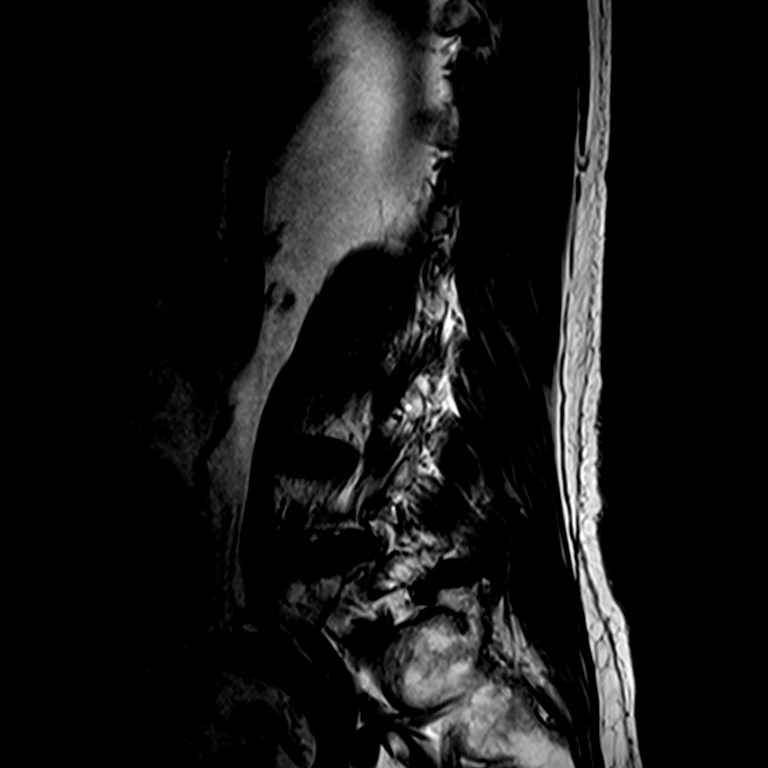

[Series 6: T2 · axial · 4.0mm · 0.22mm/px · z∈[-69,+65]mm · 3 of 40 slices shown (2 of 2)]
[im 6/40]
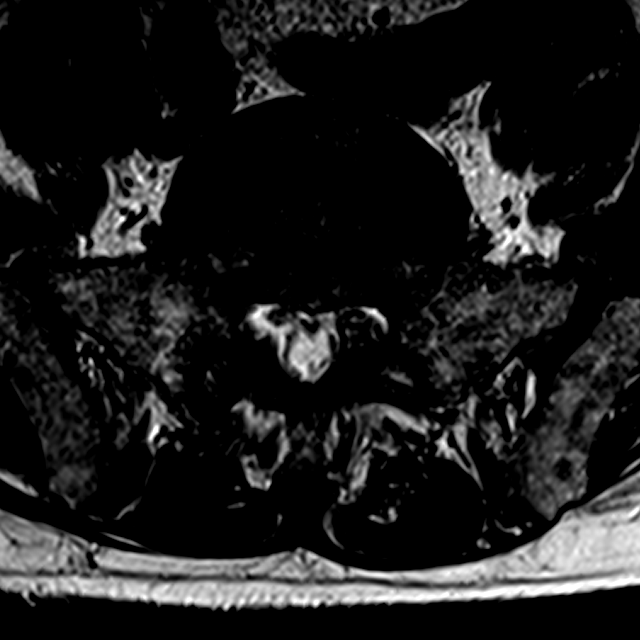
[im 20/40]
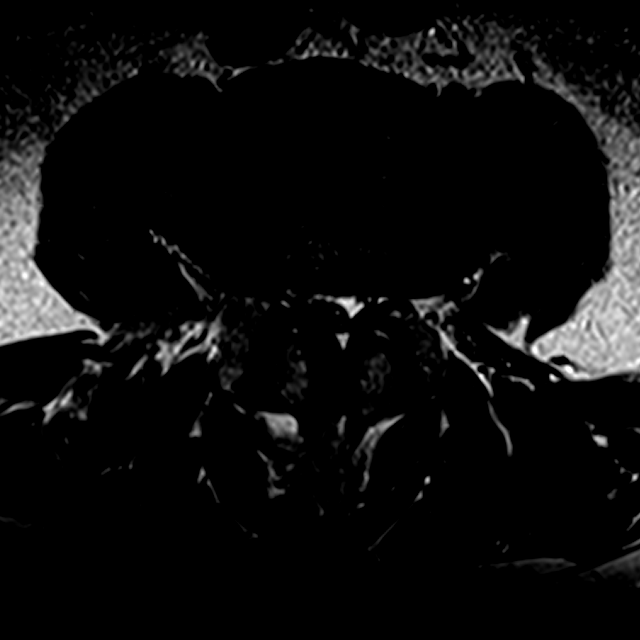
[im 34/40]
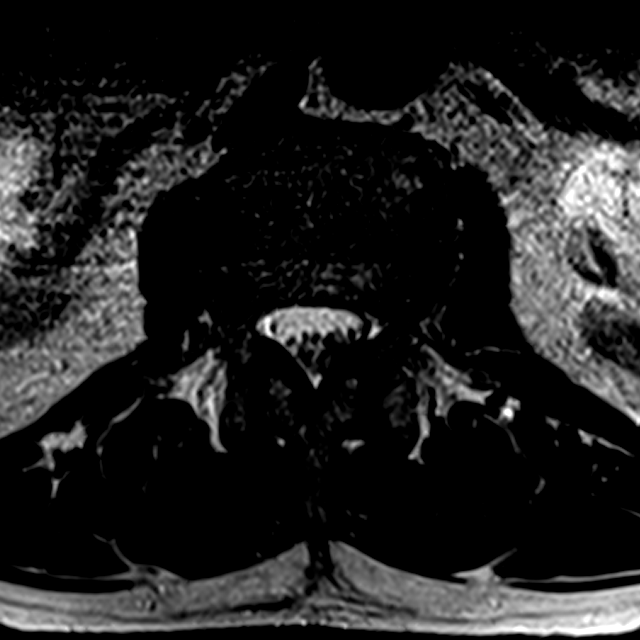

[Series 7: T1 · axial · 4.0mm · 0.22mm/px · z∈[-69,+65]mm · 3 of 40 slices shown (2 of 2)]
[im 6/40]
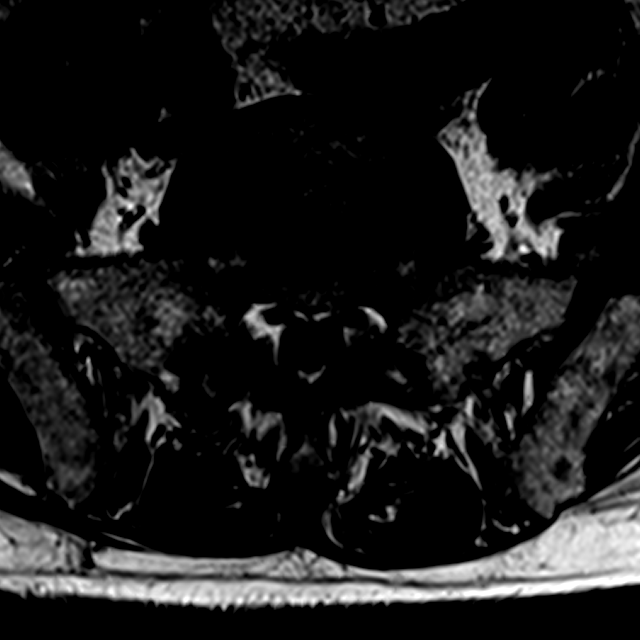
[im 20/40]
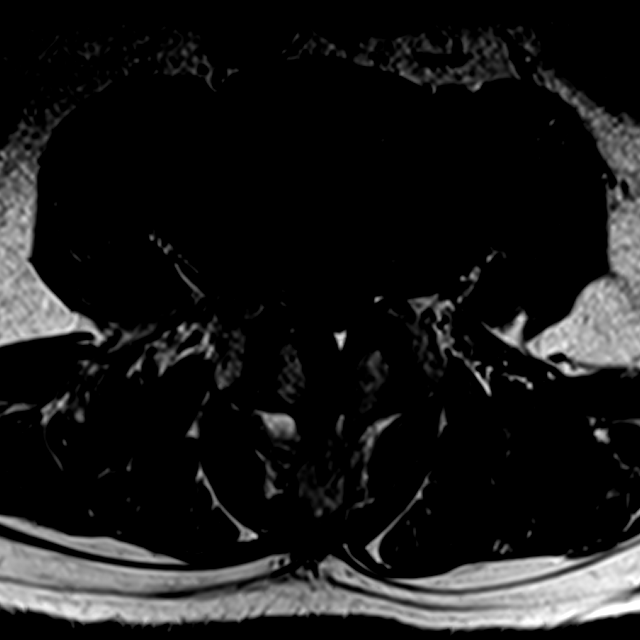
[im 34/40]
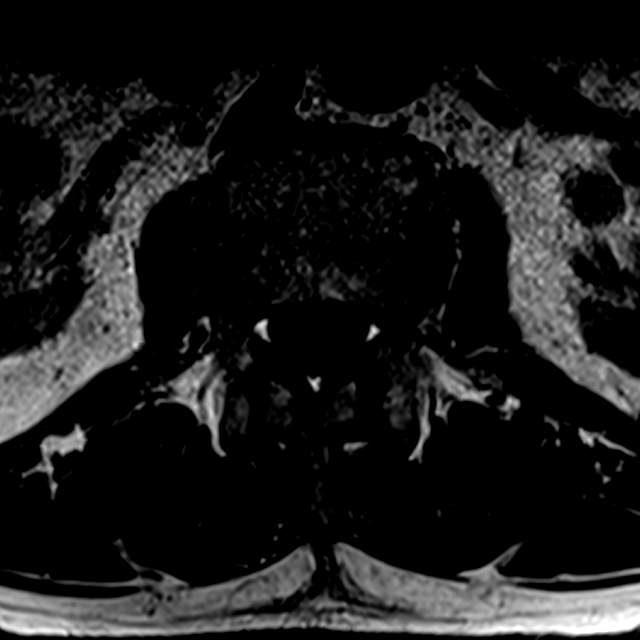

[15 of 48 positions shown; findings below may reference images not displayed]

FINDINGS: Segmentation:  Normal

Alignment:  Normal

Vertebrae: Negative for fracture or mass. Discogenic edema on the
right at L3-4. Discogenic edema on the left at L4-5. Probable
hemangioma L3 vertebral body superiorly on the right.

Conus medullaris and cauda equina: Conus extends to the L1 level.
Conus and cauda equina appear normal.

Paraspinal and other soft tissues: Negative for paraspinous mass or
edema.

Disc levels:

L1-2: Mild disc and facet degeneration without significant stenosis

L2-3: Severe spinal stenosis. This has progressed since the prior
study. Progressive disc and facet degeneration with diffuse bulging
of the disc. Moderate subarticular stenosis bilaterally right
greater than left.

L3-4: Moderate spinal stenosis approximately the same. Moderate disc
degeneration and spurring and bilateral facet and ligamentum flavum
hypertrophy. Moderate to severe subarticular stenosis bilaterally

L4-5: Severe spinal stenosis has progressed in the interval. Central
and left-sided disc protrusion has developed since the prior study.
Moderate facet hypertrophy contributes to stenosis. Severe
subarticular stenosis and foraminal stenosis bilaterally, left more
severe than the right has progressed in the interval.

L5-S1: Mild disc degeneration. Moderate facet degeneration. Moderate
subarticular stenosis bilaterally could affect the S1 nerve root
bilaterally, left greater than right.
IMPRESSION: Severe spinal stenosis L2-3 with progression. Moderate subarticular
stenosis right greater than left

Moderate spinal stenosis L3-4 unchanged from the prior study.
Moderate to severe subarticular stenosis bilaterally

Severe spinal stenosis L4-5 with progression. Central left-sided
disc protrusion. Severe subarticular and foraminal stenosis
bilaterally left greater than right has progressed in the interval

Moderate subarticular stenosis bilaterally L5-S1 left greater than
right

## 2018-11-13 DIAGNOSIS — H43393 Other vitreous opacities, bilateral: Secondary | ICD-10-CM | POA: Diagnosis not present

## 2018-11-13 DIAGNOSIS — H40033 Anatomical narrow angle, bilateral: Secondary | ICD-10-CM | POA: Diagnosis not present

## 2018-11-17 ENCOUNTER — Encounter (INDEPENDENT_AMBULATORY_CARE_PROVIDER_SITE_OTHER): Payer: Medicare HMO | Admitting: Ophthalmology

## 2018-11-17 ENCOUNTER — Other Ambulatory Visit: Payer: Self-pay

## 2018-11-17 DIAGNOSIS — H43813 Vitreous degeneration, bilateral: Secondary | ICD-10-CM | POA: Diagnosis not present

## 2018-11-17 DIAGNOSIS — H353132 Nonexudative age-related macular degeneration, bilateral, intermediate dry stage: Secondary | ICD-10-CM | POA: Diagnosis not present

## 2018-11-18 DIAGNOSIS — Z96652 Presence of left artificial knee joint: Secondary | ICD-10-CM | POA: Diagnosis not present

## 2018-11-18 DIAGNOSIS — Z471 Aftercare following joint replacement surgery: Secondary | ICD-10-CM | POA: Diagnosis not present

## 2018-12-16 ENCOUNTER — Telehealth: Payer: Self-pay | Admitting: Family Medicine

## 2018-12-16 NOTE — Chronic Care Management (AMB) (Signed)
°  Chronic Care Management   Outreach Note  12/16/2018 Name: Terry Sloan MRN: [id number] DOB: 1940-10-09  Referred by: Dettinger, Fransisca Kaufmann, MD Reason for referral : Chronic Care Management (Initial CCM outreach was unsuccessful)   An unsuccessful telephone outreach was attempted today. The patient was referred to the case management team by for assistance with chronic care management and care coordination.   Follow Up Plan: A HIPPA compliant phone message was left for the patient providing contact information and requesting a return call.  The care management team will reach out to the patient again over the next 7 days.  If patient returns call to provider office, please advise to call [place] [facility] [place]  [email or web address]   ??[phone]

## 2018-12-17 ENCOUNTER — Other Ambulatory Visit: Payer: Self-pay

## 2018-12-17 ENCOUNTER — Encounter (INDEPENDENT_AMBULATORY_CARE_PROVIDER_SITE_OTHER): Payer: Medicare HMO | Admitting: Ophthalmology

## 2018-12-17 DIAGNOSIS — H353132 Nonexudative age-related macular degeneration, bilateral, intermediate dry stage: Secondary | ICD-10-CM

## 2018-12-17 DIAGNOSIS — H43813 Vitreous degeneration, bilateral: Secondary | ICD-10-CM

## 2018-12-22 NOTE — Chronic Care Management (AMB) (Signed)
°  Chronic Care Management   Outreach Note  12/22/2018 Name: Terry Sloan MRN: [id number] DOB: 1940-10-19  Referred by: Dettinger, Fransisca Kaufmann, MD Reason for referral : Chronic Care Management (Initial CCM outreach was unsuccessful)   Third unsuccessful telephone outreach was attempted today. The patient was referred to the case management team for assistance with chronic care management and care coordination. The patient's primary care provider has been notified of our unsuccessful attempts to make or maintain contact with the patient. The care management team is pleased to engage with this patient at any time in the future should he/she be interested in assistance from the care management team.   Follow Up Plan: The care management team is available to follow up with the patient after provider conversation with the patient regarding recommendation for care management engagement and subsequent re-referral to the care management team.   [place]  [email or web address]   ??[phone]

## 2019-01-22 ENCOUNTER — Telehealth: Payer: Self-pay | Admitting: Family Medicine

## 2019-01-22 ENCOUNTER — Other Ambulatory Visit: Payer: Self-pay

## 2019-01-25 ENCOUNTER — Ambulatory Visit (INDEPENDENT_AMBULATORY_CARE_PROVIDER_SITE_OTHER): Payer: Medicare HMO | Admitting: Family Medicine

## 2019-01-25 ENCOUNTER — Ambulatory Visit: Payer: Medicare HMO

## 2019-01-25 ENCOUNTER — Encounter (INDEPENDENT_AMBULATORY_CARE_PROVIDER_SITE_OTHER): Payer: Self-pay

## 2019-01-25 ENCOUNTER — Other Ambulatory Visit: Payer: Self-pay

## 2019-01-25 ENCOUNTER — Ambulatory Visit: Payer: Medicare HMO | Admitting: Family Medicine

## 2019-01-25 ENCOUNTER — Encounter: Payer: Self-pay | Admitting: Family Medicine

## 2019-01-25 VITALS — BP 100/67 | HR 81 | Temp 98.4°F | Ht 71.5 in | Wt 177.2 lb

## 2019-01-25 DIAGNOSIS — Z131 Encounter for screening for diabetes mellitus: Secondary | ICD-10-CM | POA: Diagnosis not present

## 2019-01-25 DIAGNOSIS — N401 Enlarged prostate with lower urinary tract symptoms: Secondary | ICD-10-CM | POA: Diagnosis not present

## 2019-01-25 DIAGNOSIS — Z23 Encounter for immunization: Secondary | ICD-10-CM

## 2019-01-25 DIAGNOSIS — N3281 Overactive bladder: Secondary | ICD-10-CM | POA: Insufficient documentation

## 2019-01-25 DIAGNOSIS — E782 Mixed hyperlipidemia: Secondary | ICD-10-CM | POA: Diagnosis not present

## 2019-01-25 MED ORDER — PRAVASTATIN SODIUM 40 MG PO TABS: ORAL_TABLET | ORAL | 3 refills | Status: DC

## 2019-01-25 MED ORDER — TAMSULOSIN HCL 0.4 MG PO CAPS: 0.4000 mg | ORAL_CAPSULE | Freq: Every day | ORAL | 3 refills | Status: DC

## 2019-01-25 NOTE — Progress Notes (Signed)
BP 100/67   Pulse 81   Temp 98.4 F (36.9 C) (Temporal)   Ht 5' 11.5" (1.816 m)   Wt 177 lb 3.2 oz (80.4 kg)   BMI 24.37 kg/m    Subjective:   Patient ID: Terry Sloan, male    DOB: Dec 24, 1940, 78 y.o.   MRN: [id number]  HPI: Terry Sloan is a 78 y.o. male presenting on 01/25/2019 for Hyperlipidemia (6 month follow up)   HPI Hyperlipidemia Patient is coming in for recheck of his hyperlipidemia. The patient is currently taking pravastatin. They deny any issues with myalgias or history of liver damage from it. They deny any focal numbness or weakness or chest pain.   Patient has BPH and overactive bladder for which he takes Ditropan and tamsulosin, sometimes he does have some orthostatic dizziness and we tried to get Myrbetriq but he could not afford, we will continue with this and will watch for getting up slowly.  Relevant past medical, surgical, family and social history reviewed and updated as indicated. Interim medical history since our last visit reviewed. Allergies and medications reviewed and updated.  Review of Systems  Constitutional: Negative for chills and fever.  Respiratory: Negative for shortness of breath and wheezing.   Cardiovascular: Negative for chest pain and leg swelling.  Musculoskeletal: Negative for back pain and gait problem.  Skin: Negative for rash.  Neurological: Positive for dizziness and light-headedness. Negative for weakness and numbness.  All other systems reviewed and are negative.   Per HPI unless specifically indicated above   Allergies as of 01/25/2019      Reactions   Celebrex [celecoxib] Other (See Comments)   Leg swelling, broke out in rash      Medication List       Accurate as of January 25, 2019  2:13 PM. If you have any questions, ask your nurse or doctor.        STOP taking these medications   enoxaparin 40 MG/0.4ML injection Commonly known as: LOVENOX Stopped by: Fransisca Kaufmann Akera Snowberger, MD   oxyCODONE 5 MG immediate  release tablet Commonly known as: Oxy IR/ROXICODONE Stopped by: Fransisca Kaufmann Ishana Blades, MD     TAKE these medications   oxybutynin 5 MG tablet Commonly known as: DITROPAN Take 1 tablet by mouth daily.   pravastatin 40 MG tablet Commonly known as: PRAVACHOL TAKE ONE (1) TABLET EACH DAY   tamsulosin 0.4 MG Caps capsule Commonly known as: FLOMAX TAKE ONE (1) CAPSULE EACH DAY        Objective:   BP 100/67   Pulse 81   Temp 98.4 F (36.9 C) (Temporal)   Ht 5' 11.5" (1.816 m)   Wt 177 lb 3.2 oz (80.4 kg)   BMI 24.37 kg/m   Wt Readings from Last 3 Encounters:  01/25/19 177 lb 3.2 oz (80.4 kg)  07/27/18 188 lb 6.4 oz (85.5 kg)  01/23/18 185 lb (83.9 kg)    Physical Exam Vitals signs and nursing note reviewed.  Constitutional:      General: He is not in acute distress.    Appearance: He is well-developed. He is not diaphoretic.  Eyes:     General: No scleral icterus.    Conjunctiva/sclera: Conjunctivae normal.  Neck:     Musculoskeletal: Neck supple.     Thyroid: No thyromegaly.  Cardiovascular:     Rate and Rhythm: Normal rate and regular rhythm.     Heart sounds: Normal heart sounds. No murmur.  Pulmonary:  Effort: Pulmonary effort is normal. No respiratory distress.     Breath sounds: Normal breath sounds. No wheezing.  Musculoskeletal: Normal range of motion.  Lymphadenopathy:     Cervical: No cervical adenopathy.  Skin:    General: Skin is warm and dry.     Findings: No rash.  Neurological:     Mental Status: He is alert and oriented to person, place, and time.     Coordination: Coordination normal.  Psychiatric:        Behavior: Behavior normal.       Assessment & Plan:   Problem List Items Addressed This Visit      Genitourinary   BPH (benign prostatic hyperplasia) - Primary   Relevant Medications   tamsulosin (FLOMAX) 0.4 MG CAPS capsule   OAB (overactive bladder)     Other   Mixed hyperlipidemia   Relevant Medications   pravastatin  (PRAVACHOL) 40 MG tablet   Other Relevant Orders   CBC with Differential/Platelet   CMP14+EGFR   Lipid panel    Other Visit Diagnoses    Diabetes mellitus screening       Relevant Orders   CBC with Differential/Platelet   CMP14+EGFR   Lipid panel      Continue pravastatin and tamsulosin and Ditropan.  Discussed urology referral but patient would not like to do that at this point. Follow up plan: No follow-ups on file.  Counseling provided for all of the vaccine components No orders of the defined types were placed in this encounter.   Caryl Pina, MD [facility] 01/25/2019, 2:14 PM

## 2019-01-25 NOTE — Addendum Note (Signed)
Addended by: Karle Plumber on: 01/25/2019 03:12 PM   Modules accepted: Orders

## 2019-01-26 LAB — CMP14+EGFR
ALT: 12 IU/L (ref 0–44)
AST: 17 IU/L (ref 0–40)
Albumin/Globulin Ratio: 1.7 (ref 1.2–2.2)
Albumin: 4.1 g/dL (ref 3.7–4.7)
Alkaline Phosphatase: 84 IU/L (ref 39–117)
BUN/Creatinine Ratio: 13 (ref 10–24)
BUN: 18 mg/dL (ref 8–27)
Bilirubin Total: 0.3 mg/dL (ref 0.0–1.2)
CO2: 21 mmol/L (ref 20–29)
Calcium: 9.4 mg/dL (ref 8.6–10.2)
Chloride: 106 mmol/L (ref 96–106)
Creatinine, Ser: 1.39 mg/dL — ABNORMAL HIGH (ref 0.76–1.27)
GFR calc Af Amer: 55 mL/min/{1.73_m2} — ABNORMAL LOW (ref >59)
GFR calc non Af Amer: 48 mL/min/{1.73_m2} — ABNORMAL LOW (ref >59)
Globulin, Total: 2.4 g/dL (ref 1.5–4.5)
Glucose: 103 mg/dL — ABNORMAL HIGH (ref 65–99)
Potassium: 4.1 mmol/L (ref 3.5–5.2)
Sodium: 142 mmol/L (ref 134–144)
Total Protein: 6.5 g/dL (ref 6.0–8.5)

## 2019-01-26 LAB — CBC WITH DIFFERENTIAL/PLATELET
Basophils Absolute: 0 10*3/uL (ref 0.0–0.2)
Basos: 1 %
EOS (ABSOLUTE): 0.1 10*3/uL (ref 0.0–0.4)
Eos: 1 %
Hematocrit: 38.3 % (ref 37.5–51.0)
Hemoglobin: 12.8 g/dL — ABNORMAL LOW (ref 13.0–17.7)
Immature Grans (Abs): 0 10*3/uL (ref 0.0–0.1)
Immature Granulocytes: 0 %
Lymphocytes Absolute: 1.1 10*3/uL (ref 0.7–3.1)
Lymphs: 28 %
MCH: 30.8 pg (ref 26.6–33.0)
MCHC: 33.4 g/dL (ref 31.5–35.7)
MCV: 92 fL (ref 79–97)
Monocytes Absolute: 0.4 10*3/uL (ref 0.1–0.9)
Monocytes: 11 %
Neutrophils Absolute: 2.4 10*3/uL (ref 1.4–7.0)
Neutrophils: 59 %
Platelets: 203 10*3/uL (ref 150–450)
RBC: 4.15 x10E6/uL (ref 4.14–5.80)
RDW: 12.6 % (ref 11.6–15.4)
WBC: 4.1 10*3/uL (ref 3.4–10.8)

## 2019-01-26 LAB — LIPID PANEL
Chol/HDL Ratio: 4 ratio (ref 0.0–5.0)
Cholesterol, Total: 160 mg/dL (ref 100–199)
HDL: 40 mg/dL (ref >39)
LDL Calculated: 101 mg/dL — ABNORMAL HIGH (ref 0–99)
Triglycerides: 97 mg/dL (ref 0–149)
VLDL Cholesterol Cal: 19 mg/dL (ref 5–40)

## 2019-04-14 ENCOUNTER — Ambulatory Visit (INDEPENDENT_AMBULATORY_CARE_PROVIDER_SITE_OTHER): Payer: Medicare HMO

## 2019-04-14 ENCOUNTER — Ambulatory Visit (INDEPENDENT_AMBULATORY_CARE_PROVIDER_SITE_OTHER): Payer: Medicare HMO | Admitting: Family Medicine

## 2019-04-14 ENCOUNTER — Encounter: Payer: Self-pay | Admitting: Family Medicine

## 2019-04-14 ENCOUNTER — Other Ambulatory Visit: Payer: Self-pay

## 2019-04-14 VITALS — BP 130/73 | HR 79 | Temp 98.6°F | Ht 71.5 in | Wt 179.6 lb

## 2019-04-14 DIAGNOSIS — M542 Cervicalgia: Secondary | ICD-10-CM

## 2019-04-14 DIAGNOSIS — S161XXA Strain of muscle, fascia and tendon at neck level, initial encounter: Secondary | ICD-10-CM

## 2019-04-14 MED ORDER — PREDNISONE 20 MG PO TABS: ORAL_TABLET | ORAL | 0 refills | Status: DC

## 2019-04-14 NOTE — Progress Notes (Signed)
BP 130/73   Pulse 79   Temp 98.6 F (37 C) (Temporal)   Ht 5' 11.5" (1.816 m)   Wt 179 lb 9.6 oz (81.5 kg)   SpO2 98%   BMI 24.70 kg/m    Subjective:   Patient ID: Terry Sloan, male    DOB: 06-20-1940, 78 y.o.   MRN: [id number]  HPI: Terry Sloan is a 78 y.o. male presenting on 04/14/2019 for Neck Pain (States it has been "grinding". Patient states that it has not gotten any better. Patient states it has been going on a few months after falling outside of the bed)   HPI Patient is calling in today for neck pain and grinding in his neck and pain on the right side that is been bothering him over the past couple months.  He says he fell out of the bed and hit the top of his head on a nightstand a couple months ago and then he said it was not really getting better and then he did the same thing about 3 weeks ago.  Since that time he is still having pain on that right side of his neck that goes sometimes down into his shoulder and it hurts more when he turns to the right.  He says that has been improving but not resolving.  Patient denies any lightheadedness or dizziness or numbness or weakness in either of his arms.  Patient denies any pain radiating anywhere else besides the top of the neck and down towards the shoulder.  Relevant past medical, surgical, family and social history reviewed and updated as indicated. Interim medical history since our last visit reviewed. Allergies and medications reviewed and updated.  Review of Systems  Constitutional: Negative for chills and fever.  Respiratory: Negative for shortness of breath and wheezing.   Cardiovascular: Negative for chest pain and leg swelling.  Musculoskeletal: Positive for myalgias and neck pain. Negative for back pain and gait problem.  Skin: Negative for rash.  All other systems reviewed and are negative.   Per HPI unless specifically indicated above   Allergies as of 04/14/2019      Reactions   Celebrex [celecoxib] Other  (See Comments)   Leg swelling, broke out in rash      Medication List       Accurate as of April 14, 2019  4:44 PM. If you have any questions, ask your nurse or doctor.        oxybutynin 5 MG tablet Commonly known as: DITROPAN Take 1 tablet by mouth daily.   pravastatin 40 MG tablet Commonly known as: PRAVACHOL TAKE ONE (1) TABLET EACH DAY   predniSONE 20 MG tablet Commonly known as: DELTASONE 2 po at same time daily for 5 days Started by: Elige Radon Shantelle Alles, MD   tamsulosin 0.4 MG Caps capsule Commonly known as: FLOMAX Take 1 capsule (0.4 mg total) by mouth daily after supper.        Objective:   BP 130/73   Pulse 79   Temp 98.6 F (37 C) (Temporal)   Ht 5' 11.5" (1.816 m)   Wt 179 lb 9.6 oz (81.5 kg)   SpO2 98%   BMI 24.70 kg/m   Wt Readings from Last 3 Encounters:  04/14/19 179 lb 9.6 oz (81.5 kg)  01/25/19 177 lb 3.2 oz (80.4 kg)  07/27/18 188 lb 6.4 oz (85.5 kg)    Physical Exam Vitals signs and nursing note reviewed.  Constitutional:  General: He is not in acute distress.    Appearance: He is well-developed. He is not diaphoretic.  Eyes:     General: No scleral icterus.    Conjunctiva/sclera: Conjunctivae normal.  Neck:     Musculoskeletal: Neck supple. Normal range of motion. Crepitus, pain with movement and muscular tenderness present. No erythema, neck rigidity, torticollis or spinous process tenderness.     Thyroid: No thyromegaly.   Musculoskeletal: Normal range of motion.  Lymphadenopathy:     Cervical: No cervical adenopathy.  Skin:    General: Skin is warm and dry.     Findings: No rash.  Neurological:     Mental Status: He is alert and oriented to person, place, and time.     Coordination: Coordination normal.  Psychiatric:        Behavior: Behavior normal.     C-spine x-ray: Significant arthritic changes but no acute bony abnormalities or fractures noted, await final read from radiology  Assessment & Plan:   Problem  List Items Addressed This Visit    None    Visit Diagnoses    Neck pain    -  Primary   Relevant Medications   predniSONE (DELTASONE) 20 MG tablet   Other Relevant Orders   DG Cervical Spine Complete   Strain of neck muscle, initial encounter       Relevant Medications   predniSONE (DELTASONE) 20 MG tablet   Other Relevant Orders   DG Cervical Spine Complete      Will do short course of prednisone and if not improving then we will send him to physical therapy, he will call back in a week if not improved and we will see physical therapy. Follow up plan: Return if symptoms worsen or fail to improve.  Counseling provided for all of the vaccine components Orders Placed This Encounter  Procedures  . DG Cervical Spine Complete    Caryl Pina, MD [facility] 04/14/2019, 4:44 PM

## 2019-06-01 ENCOUNTER — Telehealth: Payer: Self-pay | Admitting: Family Medicine

## 2019-06-01 ENCOUNTER — Other Ambulatory Visit: Payer: Self-pay | Admitting: Family Medicine

## 2019-06-01 DIAGNOSIS — M2550 Pain in unspecified joint: Secondary | ICD-10-CM

## 2019-06-01 DIAGNOSIS — M542 Cervicalgia: Secondary | ICD-10-CM

## 2019-06-01 DIAGNOSIS — Z96652 Presence of left artificial knee joint: Secondary | ICD-10-CM

## 2019-06-01 NOTE — Telephone Encounter (Signed)
Would like to get the rheumatology referral ordered.  Patient was offered physical therapy but was not interested. Daughter hopes for an appointment as close to this area as possible. Thanks for information about prednisone.

## 2019-06-01 NOTE — Telephone Encounter (Signed)
I reviewed the last note with Dr. Warrick Parisian.  He gave the patient 5-day burst of prednisone to see if this would improve inflammation.  It was not intended to be continued long-term as it can impact his blood pressure and blood sugar as well as bone density.  May need to consider evaluation with rheumatology if his pain has come back but was responsive to prednisone.  Sometimes this can indicate underlying autoimmune disease.

## 2019-06-08 NOTE — Telephone Encounter (Signed)
I agree with Dr. Nadine Counts, looks like she already placed the rheumatology referral but prednisone is not intended to be a long-term medicine.

## 2019-06-18 ENCOUNTER — Other Ambulatory Visit: Payer: Self-pay

## 2019-06-18 DIAGNOSIS — N401 Enlarged prostate with lower urinary tract symptoms: Secondary | ICD-10-CM

## 2019-06-18 MED ORDER — TAMSULOSIN HCL 0.4 MG PO CAPS: 0.4000 mg | ORAL_CAPSULE | Freq: Every day | ORAL | 0 refills | Status: DC

## 2019-06-25 ENCOUNTER — Ambulatory Visit: Payer: Medicare HMO | Admitting: Urology

## 2019-07-05 ENCOUNTER — Telehealth: Payer: Self-pay | Admitting: Urology

## 2019-07-05 NOTE — Telephone Encounter (Signed)
Patient needs refill on oxybutin sent to [facility]. Pts family member wants to make sure I note that he was scheduled in January for f/u and we called to reschedule.

## 2019-07-06 ENCOUNTER — Other Ambulatory Visit: Payer: Self-pay

## 2019-07-06 MED ORDER — OXYBUTYNIN CHLORIDE 5 MG PO TABS: 5.0000 mg | ORAL_TABLET | Freq: Every day | ORAL | 2 refills | Status: DC

## 2019-07-06 NOTE — Telephone Encounter (Signed)
Medication refilled with 1 refill until next appt

## 2019-07-30 ENCOUNTER — Encounter: Payer: Self-pay | Admitting: Family Medicine

## 2019-07-30 ENCOUNTER — Other Ambulatory Visit: Payer: Self-pay

## 2019-07-30 ENCOUNTER — Ambulatory Visit (INDEPENDENT_AMBULATORY_CARE_PROVIDER_SITE_OTHER): Payer: Medicare HMO | Admitting: Family Medicine

## 2019-07-30 VITALS — BP 143/73 | HR 60 | Temp 97.8°F | Ht 71.5 in | Wt 177.4 lb

## 2019-07-30 DIAGNOSIS — Z23 Encounter for immunization: Secondary | ICD-10-CM

## 2019-07-30 DIAGNOSIS — E782 Mixed hyperlipidemia: Secondary | ICD-10-CM

## 2019-07-30 DIAGNOSIS — N401 Enlarged prostate with lower urinary tract symptoms: Secondary | ICD-10-CM | POA: Diagnosis not present

## 2019-07-30 DIAGNOSIS — N3281 Overactive bladder: Secondary | ICD-10-CM | POA: Diagnosis not present

## 2019-07-30 MED ORDER — TAMSULOSIN HCL 0.4 MG PO CAPS: 0.4000 mg | ORAL_CAPSULE | Freq: Every day | ORAL | 3 refills | Status: DC

## 2019-07-30 NOTE — Addendum Note (Signed)
Addended by: Angela Adam on: 07/30/2019 04:11 PM   Modules accepted: Orders

## 2019-07-30 NOTE — Progress Notes (Signed)
BP (!) 143/73   Pulse 60   Temp 97.8 F (36.6 C) (Temporal)   Ht 5' 11.5" (1.816 m)   Wt 177 lb 6.4 oz (80.5 kg)   SpO2 98%   BMI 24.40 kg/m    Subjective:   Patient ID: Terry Sloan, male    DOB: 12-16-1940, 79 y.o.   MRN: [id number]  HPI: Terry Sloan is a 79 y.o. male presenting on 07/30/2019 for Hyperlipidemia (6 month follow up )   HPI Hyperlipidemia Patient is coming in for recheck of his hyperlipidemia. The patient is currently taking pravastatin. They deny any issues with myalgias or history of liver damage from it. They deny any focal numbness or weakness or chest pain.   BPH and OAB Patient is coming in for recheck on BPH and OAB Symptoms: Urinary frequency but improved Medication: Oxybutynin 5 mg and Flomax 0.4 Last PSA: 1.7 on 07/27/2018  Relevant past medical, surgical, family and social history reviewed and updated as indicated. Interim medical history since our last visit reviewed. Allergies and medications reviewed and updated.  Review of Systems  Constitutional: Negative for chills and fever.  HENT: Negative for congestion.   Eyes: Negative for discharge.  Respiratory: Negative for cough, shortness of breath and wheezing.   Cardiovascular: Negative for chest pain and leg swelling.  Gastrointestinal: Negative for abdominal pain.  Musculoskeletal: Negative for back pain and gait problem.  Skin: Negative for rash.  Neurological: Negative for dizziness, weakness and light-headedness.  All other systems reviewed and are negative.   Per HPI unless specifically indicated above   Allergies as of 07/30/2019      Reactions   Celebrex [celecoxib] Other (See Comments)   Leg swelling, broke out in rash      Medication List       Accurate as of July 30, 2019  3:48 PM. If you have any questions, ask your nurse or doctor.        STOP taking these medications   predniSONE 20 MG tablet Commonly known as: DELTASONE Stopped by: Fransisca Kaufmann Evagelia Knack, MD     TAKE these medications   oxybutynin 5 MG tablet Commonly known as: DITROPAN Take 1 tablet (5 mg total) by mouth daily.   pravastatin 40 MG tablet Commonly known as: PRAVACHOL TAKE ONE (1) TABLET EACH DAY   tamsulosin 0.4 MG Caps capsule Commonly known as: FLOMAX Take 1 capsule (0.4 mg total) by mouth daily after supper.        Objective:   BP (!) 143/73   Pulse 60   Temp 97.8 F (36.6 C) (Temporal)   Ht 5' 11.5" (1.816 m)   Wt 177 lb 6.4 oz (80.5 kg)   SpO2 98%   BMI 24.40 kg/m   Wt Readings from Last 3 Encounters:  07/30/19 177 lb 6.4 oz (80.5 kg)  04/14/19 179 lb 9.6 oz (81.5 kg)  01/25/19 177 lb 3.2 oz (80.4 kg)    Physical Exam Vitals and nursing note reviewed.  Constitutional:      General: He is not in acute distress.    Appearance: He is well-developed. He is not diaphoretic.  Eyes:     General: No scleral icterus.    Conjunctiva/sclera: Conjunctivae normal.  Neck:     Thyroid: No thyromegaly.  Cardiovascular:     Rate and Rhythm: Normal rate and regular rhythm.     Heart sounds: Normal heart sounds. No murmur.  Pulmonary:     Effort: Pulmonary effort is normal.  No respiratory distress.     Breath sounds: Normal breath sounds. No wheezing.  Musculoskeletal:        General: Normal range of motion.     Cervical back: Neck supple.  Lymphadenopathy:     Cervical: No cervical adenopathy.  Skin:    General: Skin is warm and dry.     Findings: No rash.  Neurological:     Mental Status: He is alert and oriented to person, place, and time.     Coordination: Coordination normal.  Psychiatric:        Behavior: Behavior normal.       Assessment & Plan:   Problem List Items Addressed This Visit      Genitourinary   BPH (benign prostatic hyperplasia) - Primary   Relevant Medications   tamsulosin (FLOMAX) 0.4 MG CAPS capsule   Other Relevant Orders   CBC with Differential/Platelet   PSA, total and free   OAB (overactive bladder)   Relevant  Orders   CBC with Differential/Platelet   CMP14+EGFR     Other   Mixed hyperlipidemia   Relevant Orders   Lipid panel      Continue current medication, will see urology still. Follow up plan: Return in about 6 months (around 01/27/2020), or if symptoms worsen or fail to improve, for BPH and hyperlipidemia.  Counseling provided for all of the vaccine components Orders Placed This Encounter  Procedures  . CBC with Differential/Platelet  . CMP14+EGFR  . Lipid panel  . PSA, total and free    Caryl Pina, MD [facility] 07/30/2019, 3:48 PM

## 2019-07-31 LAB — CMP14+EGFR
ALT: 12 IU/L (ref 0–44)
AST: 22 IU/L (ref 0–40)
Albumin/Globulin Ratio: 2 (ref 1.2–2.2)
Albumin: 4.1 g/dL (ref 3.7–4.7)
Alkaline Phosphatase: 90 IU/L (ref 39–117)
BUN/Creatinine Ratio: 14 (ref 10–24)
BUN: 18 mg/dL (ref 8–27)
Bilirubin Total: 0.3 mg/dL (ref 0.0–1.2)
CO2: 22 mmol/L (ref 20–29)
Calcium: 9.1 mg/dL (ref 8.6–10.2)
Chloride: 108 mmol/L — ABNORMAL HIGH (ref 96–106)
Creatinine, Ser: 1.33 mg/dL — ABNORMAL HIGH (ref 0.76–1.27)
GFR calc Af Amer: 58 mL/min/{1.73_m2} — ABNORMAL LOW (ref >59)
GFR calc non Af Amer: 50 mL/min/{1.73_m2} — ABNORMAL LOW (ref >59)
Globulin, Total: 2.1 g/dL (ref 1.5–4.5)
Glucose: 97 mg/dL (ref 65–99)
Potassium: 4.1 mmol/L (ref 3.5–5.2)
Sodium: 141 mmol/L (ref 134–144)
Total Protein: 6.2 g/dL (ref 6.0–8.5)

## 2019-07-31 LAB — CBC WITH DIFFERENTIAL/PLATELET
Basophils Absolute: 0 10*3/uL (ref 0.0–0.2)
Basos: 1 %
EOS (ABSOLUTE): 0.1 10*3/uL (ref 0.0–0.4)
Eos: 2 %
Hematocrit: 36.5 % — ABNORMAL LOW (ref 37.5–51.0)
Hemoglobin: 12.5 g/dL — ABNORMAL LOW (ref 13.0–17.7)
Immature Grans (Abs): 0 10*3/uL (ref 0.0–0.1)
Immature Granulocytes: 0 %
Lymphocytes Absolute: 1.2 10*3/uL (ref 0.7–3.1)
Lymphs: 32 %
MCH: 31.6 pg (ref 26.6–33.0)
MCHC: 34.2 g/dL (ref 31.5–35.7)
MCV: 92 fL (ref 79–97)
Monocytes Absolute: 0.4 10*3/uL (ref 0.1–0.9)
Monocytes: 11 %
Neutrophils Absolute: 2 10*3/uL (ref 1.4–7.0)
Neutrophils: 54 %
Platelets: 203 10*3/uL (ref 150–450)
RBC: 3.95 x10E6/uL — ABNORMAL LOW (ref 4.14–5.80)
RDW: 12.3 % (ref 11.6–15.4)
WBC: 3.8 10*3/uL (ref 3.4–10.8)

## 2019-07-31 LAB — LIPID PANEL
Chol/HDL Ratio: 3.3 ratio (ref 0.0–5.0)
Cholesterol, Total: 148 mg/dL (ref 100–199)
HDL: 45 mg/dL (ref >39)
LDL Chol Calc (NIH): 87 mg/dL (ref 0–99)
Triglycerides: 84 mg/dL (ref 0–149)
VLDL Cholesterol Cal: 16 mg/dL (ref 5–40)

## 2019-07-31 LAB — PSA, TOTAL AND FREE
PSA, Free Pct: 45 %
PSA, Free: 0.45 ng/mL
Prostate Specific Ag, Serum: 1 ng/mL (ref 0.0–4.0)

## 2019-08-09 NOTE — Progress Notes (Signed)
Office Visit Note  Patient: Terry Sloan             Date of Birth: Sep 04, 1940           MRN: [id number]             PCP: Dettinger, Fransisca Kaufmann, MD Referring: Janora Norlander, DO Visit Date: 08/13/2019 Occupation: @GUAROCC @  Subjective:  New Patient (Initial Visit) (Neck pain)   History of Present Illness: Terry Sloan is a 79 y.o. male seen in consultation per request of his PCP.  According to patient he had cervical spine discectomy in his 28s due to an injury.  He also had an injury with a tree falling on his head which required surgery and plate placement which she describes over the right frontal region.  He states in January 2021 he rolled out of the bed and fell where he hurt his neck.  He was seen by his PCP and with some x-rays which showed degenerative changes he was given prednisone taper for a week which helped.  But he has had recurrent pain in his cervical spine since then.  Sometimes the pain could be excruciating so that he has to stop and lay down.  There is no radiculopathy.  The pain is localized to his neck.  None of the other joints are painful.  He had left total knee replacement and revision in 2018.  Which is doing well.  Activities of Daily Living:  Patient reports morning stiffness for 0 none.   Patient Denies nocturnal pain.  Difficulty dressing/grooming: Denies Difficulty climbing stairs: Denies Difficulty getting out of chair: Denies Difficulty using hands for taps, buttons, cutlery, and/or writing: Denies  Review of Systems  Constitutional: Negative for fatigue and night sweats.  HENT: Negative for mouth sores, mouth dryness and nose dryness.   Eyes: Positive for dryness. Negative for redness.  Respiratory: Negative for shortness of breath and difficulty breathing.   Cardiovascular: Negative for chest pain, palpitations, hypertension, irregular heartbeat and swelling in legs/feet.  Gastrointestinal: Negative for constipation and diarrhea.  Endocrine:  Positive for increased urination.  Genitourinary: Negative for difficulty urinating.  Musculoskeletal: Positive for arthralgias and joint pain. Negative for gait problem, joint swelling, myalgias, muscle weakness, morning stiffness, muscle tenderness and myalgias.  Skin: Negative for color change, rash, hair loss, nodules/bumps, skin tightness, ulcers and sensitivity to sunlight.  Allergic/Immunologic: Negative for susceptible to infections.  Neurological: Negative for dizziness, fainting, numbness, memory loss, night sweats and weakness ( ).  Hematological: Positive for bruising/bleeding tendency. Negative for swollen glands.  Psychiatric/Behavioral: Negative for depressed mood and sleep disturbance. The patient is not nervous/anxious.     PMFS History:  Patient Active Problem List   Diagnosis Date Noted  . OAB (overactive bladder) 01/25/2019  . BPH (benign prostatic hyperplasia) 07/27/2018  . Overweight (BMI 25.0-29.9) 01/23/2018  . History of total left knee replacement 04/17/2017  . History of TIA (transient ischemic attack) 10/16/2016  . Aphasia 10/16/2016  . Anxiety and depression 10/16/2016  . PVD (peripheral vascular disease) ([place]) 09/23/2016  . Mixed hyperlipidemia 09/23/2016  . Weight loss, unintentional 09/23/2016    Past Medical History:  Diagnosis Date  . Arthritis   . GERD (gastroesophageal reflux disease)    [name]  . History of kidney stones   . Hyperlipidemia   . Macular degeneration   . PVD (peripheral vascular disease) ([place])   . Stroke [facility])    TIA      Family History  Problem  Relation Age of Onset  . Stroke Neg Hx    Past Surgical History:  Procedure Laterality Date  . Bone turmor Left    foreheah  . EYE SURGERY Bilateral    cataract removal  . FRACTURE SURGERY Left    leg   pins  . KNEE ARTHROPLASTY    . KNEE SURGERY Left   . NECK SURGERY     Tumor reomoved from head/neck in the 60s  . REPLACEMENT TOTAL KNEE Left 07/14/2018  . ROTATOR CUFF  REPAIR Right    times 2  . TOTAL KNEE ARTHROPLASTY WITH HARDWARE REMOVAL Left 02/14/2017   Procedure: Removal Left Tibia Nail, Cemented Total Knee Arthroplasty;  Surgeon: Eldred Manges, MD;  Location: [facility] OR;  Service: Orthopedics;  Laterality: Left;   Social History   Social History Narrative   Lives at home w/ his significant other   Right-handed   Caffeine: some coffee, 3 diet Mt Dew's per day   Immunization History  Administered Date(s) Administered  . Pneumococcal Conjugate-13 07/30/2019  . Pneumococcal Polysaccharide-23 07/15/2018  . Tdap 01/25/2019     Objective: Vital Signs: BP 120/69 (BP Location: Right Arm, Patient Position: Sitting, Cuff Size: Normal)   Pulse 75   Resp 16   Ht 5\' 10"  (1.778 m)   Wt 176 lb (79.8 kg)   BMI 25.25 kg/m    Physical Exam Vitals and nursing note reviewed.  Constitutional:      Appearance: He is well-developed.  HENT:     Head: Normocephalic and atraumatic.  Eyes:     Conjunctiva/sclera: Conjunctivae normal.     Pupils: Pupils are equal, round, and reactive to light.  Cardiovascular:     Rate and Rhythm: Normal rate and regular rhythm.     Heart sounds: Normal heart sounds.  Pulmonary:     Effort: Pulmonary effort is normal.     Breath sounds: Normal breath sounds.  Abdominal:     General: Bowel sounds are normal.     Palpations: Abdomen is soft.  Musculoskeletal:     Cervical back: Normal range of motion and neck supple.  Skin:    General: Skin is warm and dry.     Capillary Refill: Capillary refill takes less than 2 seconds.  Neurological:     Mental Status: He is alert and oriented to person, place, and time.  Psychiatric:        Behavior: Behavior normal.      Musculoskeletal Exam: Patient has some limitation with lateral rotation.  Extension and flexion was full.  He had discomfort with lateral rotation on his right side.  Shoulder joints, elbow joints with good range of motion.  No MCP or PIP swelling or thickening was  noted.  DIP and PIP thickening was noted.  Hip joints, knee joints in good range of motion.  Left knee joint is replaced.  He has some DIP and PIP thickening in his feet. CDAI Exam: CDAI Score: -- Patient Global: --; Provider Global: -- Swollen: --; Tender: -- Joint Exam 08/13/2019   No joint exam has been documented for this visit   There is currently no information documented on the homunculus. Go to the Rheumatology activity and complete the homunculus joint exam.  Investigation: No additional findings.  Imaging: No results found.  Recent Labs: Lab Results  Component Value Date   WBC 3.8 07/30/2019   HGB 12.5 (L) 07/30/2019   PLT 203 07/30/2019   NA 141 07/30/2019   K 4.1 07/30/2019  CL 108 (H) 07/30/2019   CO2 22 07/30/2019   GLUCOSE 97 07/30/2019   BUN 18 07/30/2019   CREATININE 1.33 (H) 07/30/2019   BILITOT 0.3 07/30/2019   ALKPHOS 90 07/30/2019   AST 22 07/30/2019   ALT 12 07/30/2019   PROT 6.2 07/30/2019   ALBUMIN 4.1 07/30/2019   CALCIUM 9.1 07/30/2019   GFRAA 58 (L) 07/30/2019    Speciality Comments: No specialty comments available.  Procedures:  No procedures performed Allergies: Celebrex [celecoxib]   Assessment / Plan:     Visit Diagnoses: Neck pain-patient has been experiencing pain and discomfort in his cervical spine since he had a fall in January.  At that time x-rays show some degenerative changes.  I reviewed the x-rays today which showed C6-7 narrowing.  He describes ongoing pain and discomfort in his cervical spine.  He states sometimes the pain could be excruciating that he has to stop everything and lay down.  He had history of discectomy many years ago.  He denies any radiculopathy.  We will schedule MRI of the cervical spine to evaluate this further.  Primary osteoarthritis of both hands-he has some discomfort in his hands.  No synovitis was noted.  He has DIP and PIP thickening consistent with osteoarthritis.  History of total left knee  replacement-doing well with no warmth swelling or effusion.  Other medical problems are listed as follows:  Mixed hyperlipidemia  PVD (peripheral vascular disease) ([facility])  History of TIA (transient ischemic attack)  History of kidney stones  Anxiety and depression  OAB (overactive bladder)  History of gastroesophageal reflux (GERD)  Cervicalgia  Orders: Orders Placed This Encounter  Procedures  . MR CERVICAL SPINE WO CONTRAST   No orders of the defined types were placed in this encounter.     Follow-Up Instructions: Return for neck pain.   Pollyann Savoy, MD  Note - This record has been created using Animal nutritionist.  Chart creation errors have been sought, but may not always  have been located. Such creation errors do not reflect on  the standard of medical care.

## 2019-08-13 ENCOUNTER — Ambulatory Visit (INDEPENDENT_AMBULATORY_CARE_PROVIDER_SITE_OTHER): Payer: Medicare HMO | Admitting: Rheumatology

## 2019-08-13 ENCOUNTER — Encounter: Payer: Self-pay | Admitting: Rheumatology

## 2019-08-13 ENCOUNTER — Other Ambulatory Visit: Payer: Self-pay

## 2019-08-13 VITALS — BP 120/69 | HR 75 | Resp 16 | Ht 70.0 in | Wt 176.0 lb

## 2019-08-13 DIAGNOSIS — Z87442 Personal history of urinary calculi: Secondary | ICD-10-CM

## 2019-08-13 DIAGNOSIS — M19042 Primary osteoarthritis, left hand: Secondary | ICD-10-CM

## 2019-08-13 DIAGNOSIS — M542 Cervicalgia: Secondary | ICD-10-CM

## 2019-08-13 DIAGNOSIS — F419 Anxiety disorder, unspecified: Secondary | ICD-10-CM

## 2019-08-13 DIAGNOSIS — N3281 Overactive bladder: Secondary | ICD-10-CM | POA: Diagnosis not present

## 2019-08-13 DIAGNOSIS — E782 Mixed hyperlipidemia: Secondary | ICD-10-CM | POA: Diagnosis not present

## 2019-08-13 DIAGNOSIS — I739 Peripheral vascular disease, unspecified: Secondary | ICD-10-CM

## 2019-08-13 DIAGNOSIS — Z8673 Personal history of transient ischemic attack (TIA), and cerebral infarction without residual deficits: Secondary | ICD-10-CM | POA: Diagnosis not present

## 2019-08-13 DIAGNOSIS — Z96652 Presence of left artificial knee joint: Secondary | ICD-10-CM

## 2019-08-13 DIAGNOSIS — M19041 Primary osteoarthritis, right hand: Secondary | ICD-10-CM | POA: Diagnosis not present

## 2019-08-13 DIAGNOSIS — F329 Major depressive disorder, single episode, unspecified: Secondary | ICD-10-CM

## 2019-08-13 DIAGNOSIS — Z8719 Personal history of other diseases of the digestive system: Secondary | ICD-10-CM

## 2019-08-27 ENCOUNTER — Other Ambulatory Visit: Payer: Self-pay

## 2019-08-27 ENCOUNTER — Encounter: Payer: Self-pay | Admitting: Urology

## 2019-08-27 ENCOUNTER — Ambulatory Visit (INDEPENDENT_AMBULATORY_CARE_PROVIDER_SITE_OTHER): Payer: Medicare HMO | Admitting: Urology

## 2019-08-27 VITALS — BP 121/75 | HR 61 | Temp 98.1°F | Ht 69.0 in | Wt 176.0 lb

## 2019-08-27 DIAGNOSIS — R3915 Urgency of urination: Secondary | ICD-10-CM | POA: Diagnosis not present

## 2019-08-27 DIAGNOSIS — N401 Enlarged prostate with lower urinary tract symptoms: Secondary | ICD-10-CM | POA: Diagnosis not present

## 2019-08-27 LAB — POCT URINALYSIS DIPSTICK
Bilirubin, UA: NEGATIVE
Blood, UA: NEGATIVE
Glucose, UA: NEGATIVE
Ketones, UA: NEGATIVE
Leukocytes, UA: NEGATIVE
Nitrite, UA: NEGATIVE
Protein, UA: NEGATIVE
Spec Grav, UA: 1.01 (ref 1.010–1.025)
Urobilinogen, UA: NEGATIVE E.U./dL — AB
pH, UA: 5 (ref 5.0–8.0)

## 2019-08-27 MED ORDER — OXYBUTYNIN CHLORIDE 5 MG PO TABS: 5.0000 mg | ORAL_TABLET | Freq: Every day | ORAL | 3 refills | Status: DC

## 2019-08-27 NOTE — Progress Notes (Signed)
Subjective:  1. Terry Sloan with lower urinary tract symptoms, symptom details unspecified   2. Urgency of urination      Terry Sloan is a 79 year-old male established patient who is here for symptoms of enlarged prostate.    Terry Sloan returns today in f/u. He had a negative prostate biopsy on 11/21/17 for a prostate nodule. He has had resolution of his voiding symptoms with the tamsulosin and oxybutynin he was given for BPH with BOO.  He has no further GI complaints.  HIs IPSS is 6. He has nocturia x 2 and he has some urgency but he has a good stream.  He has had no hematuria or dysuria.   His PSA is 1.0 with 45% f/t ratio on 07/30/19.   He has no associated signs or symptoms.    GU Hx: Mr Sloan is a 79 yo WM who is sent in consultation by Dr. Ermalinda Memos for LUTs with incontinence. He has a several year history of progressive voiding symptoms. He had hesitancy with a reduced stream. The symptoms got worse about 2 years ago following a circumcision in [place]. He has not been able to get an erection since. He was started on Tamsulosin on 10/16/17 and his flow improved rapidly but he began to have nocturnal enuresis. He wakes having incontinence. He has no hematuria or dysuria. He feels like empties and has no intermittency. He has some daytime frequency. He has nocturia x 1. He has had no UTI's. He has had two prior stones. He passed them both. He has had no other GU surgery. His PSA was 2.4 2 years ago but only 1.2 this month. His UA is ok today.    IPSS    Row Name 08/27/19 1400         International Prostate Symptom Score   How often have you had the sensation of not emptying your bladder?  Not at All     How often have you had to urinate less than every two hours?  Less than 1 in 5 times     How often have you found you stopped and started again several times when you urinated?  Not at All     How often have you found it difficult to postpone urination?  Not at All     How often  have you had a weak urinary stream?  About half the time     How often have you had to strain to start urination?  Not at All     How many times did you typically get up at night to urinate?  2 Times     Total IPSS Score  6       Quality of Life due to urinary symptoms   If you were to spend the rest of your life with your urinary condition just the way it is now how would you feel about that?  Mostly Satisfied         ROS:  ROS:  A complete review of systems was performed.  All systems are negative except for pertinent findings as noted.   Review of Systems  Neurological: Positive for dizziness.  All other systems reviewed and are negative.   Allergies  Allergen Reactions  . Celebrex [Celecoxib] Other (See Comments)    Leg swelling, broke out in rash    Outpatient Encounter Medications as of 08/27/2019  Medication Sig  . oxybutynin (DITROPAN) 5 MG tablet Take 1 tablet (5 mg total) by  mouth daily.  . pravastatin (PRAVACHOL) 40 MG tablet TAKE ONE (1) TABLET EACH DAY  . tamsulosin (FLOMAX) 0.4 MG CAPS capsule Take 1 capsule (0.4 mg total) by mouth daily after supper.  . [DISCONTINUED] oxybutynin (DITROPAN) 5 MG tablet Take 1 tablet (5 mg total) by mouth daily.   No facility-administered encounter medications on file as of 08/27/2019.    Past Medical History:  Diagnosis Date  . Arthritis   . GERD (gastroesophageal reflux disease)    [name]  . History of kidney stones   . Hyperlipidemia   . Macular degeneration   . PVD (peripheral vascular disease) ([place])   . Stroke [facility])    TIA      Past Surgical History:  Procedure Laterality Date  . Bone turmor Left    foreheah  . EYE SURGERY Bilateral    cataract removal  . FRACTURE SURGERY Left    leg   pins  . KNEE ARTHROPLASTY    . KNEE SURGERY Left   . NECK SURGERY     Tumor reomoved from head/neck in the 60s  . REPLACEMENT TOTAL KNEE Left 07/14/2018  . ROTATOR CUFF REPAIR Right    times 2  . TOTAL KNEE ARTHROPLASTY  WITH HARDWARE REMOVAL Left 02/14/2017   Procedure: Removal Left Tibia Nail, Cemented Total Knee Arthroplasty;  Surgeon: Marybelle Killings, MD;  Location: [place];  Service: Orthopedics;  Laterality: Left;    Social History   Socioeconomic History  . Marital status: Married    Spouse name: Not on file  . Number of children: 85  . Years of education: Not on file  . Highest education level: Not on file  Occupational History  . Occupation: Retired  Tobacco Use  . Smoking status: Former Smoker    Packs/day: 1.50    Years: 20.00    Pack years: 30.00    Types: Cigarettes  . Smokeless tobacco: Never Used  . Tobacco comment: Quit 20+ year ago  Substance and Sexual Activity  . Alcohol use: Yes    Comment: Occasional beer  . Drug use: No  . Sexual activity: Not on file  Other Topics Concern  . Not on file  Social History Narrative   Lives at home w/ his significant other   Right-handed   Caffeine: some coffee, 3 diet Mt Dew's per day   Social Determinants of Health   Financial Resource Strain:   . Difficulty of Paying Living Expenses:   Food Insecurity:   . Worried About Charity fundraiser in the Last Year:   . Arboriculturist in the Last Year:   Transportation Needs:   . Film/video editor (Medical):   Marland Kitchen Lack of Transportation (Non-Medical):   Physical Activity:   . Days of Exercise per Week:   . Minutes of Exercise per Session:   Stress:   . Feeling of Stress :   Social Connections:   . Frequency of Communication with Friends and Family:   . Frequency of Social Gatherings with Friends and Family:   . Attends Religious Services:   . Active Member of Clubs or Organizations:   . Attends Archivist Meetings:   Marland Kitchen Marital Status:   Intimate Partner Violence:   . Fear of Current or Ex-Partner:   . Emotionally Abused:   Marland Kitchen Physically Abused:   . Sexually Abused:     Family History  Problem Relation Age of Onset  . Stroke Neg Hx  Objective: Vitals:    08/27/19 1358  BP: 121/75  Pulse: 61  Temp: 98.1 F (36.7 C)     Physical Exam Vitals reviewed.  Constitutional:      Appearance: Normal appearance.  Neurological:     Mental Status: He is alert.     Lab Results:  Results for orders placed or performed in visit on 08/27/19 (from the past 24 hour(s))  POCT urinalysis dipstick     Status: Abnormal   Collection Time: 08/27/19  2:10 PM  Result Value Ref Range   Color, UA yellow    Clarity, UA clear    Glucose, UA Negative Negative   Bilirubin, UA neg    Ketones, UA neg    Spec Grav, UA 1.010 1.010 - 1.025   Blood, UA neg    pH, UA 5.0 5.0 - 8.0   Protein, UA Negative Negative   Urobilinogen, UA negative (A) 0.2 or 1.0 E.U./dL   Nitrite, UA neg    Leukocytes, UA Negative Negative   Appearance clear    Odor      BMET No results for input(s): NA, K, CL, CO2, GLUCOSE, BUN, CREATININE, CALCIUM in the last 72 hours. PSA No results found for: PSA No results found for: TESTOSTERONE    Studies/Results: No results found.    Assessment & Plan: BPH with BOO and urgency.  He is doing well on current therapy.  Oxybutynin refilled.  Tamsulosin is current.   He doesn't really need further PSA testing.     Meds ordered this encounter  Medications  . oxybutynin (DITROPAN) 5 MG tablet    Sig: Take 1 tablet (5 mg total) by mouth daily.    Dispense:  90 tablet    Refill:  3     Orders Placed This Encounter  Procedures  . POCT urinalysis dipstick      Return in about 1 year (around 08/26/2020) for for routine f/u. Marland Kitchen   CC: Dettinger, Elige Radon, MD      Bjorn Pippin 08/27/2019

## 2019-09-09 ENCOUNTER — Ambulatory Visit: Payer: Medicare HMO | Admitting: Rheumatology

## 2019-09-23 ENCOUNTER — Telehealth: Payer: Self-pay | Admitting: Rheumatology

## 2019-09-23 NOTE — Telephone Encounter (Signed)
Patient's daughter calling to let you know he is ready to schedule Cspine MRI now. Patient is very Claustrophobia, and needs a rx called into [facility] in [place]. Patient wants to make sure rx is for something before MRI, and at MRI so it has time to get in his system. Patient can go to [facility] for MRI. Please advise.

## 2019-09-23 NOTE — Telephone Encounter (Signed)
Please call in Valium 5 mg, 1 tablet p.o. prior to the MRI.

## 2019-09-24 NOTE — Telephone Encounter (Signed)
Attempted to contact patient and left message on machine to advise patient to call the office.  

## 2019-09-27 ENCOUNTER — Telehealth: Payer: Self-pay | Admitting: Rheumatology

## 2019-09-27 NOTE — Telephone Encounter (Signed)
Returned the call and provided patient's daughter with number for centralized scheduling to reschedule the MRI.

## 2019-09-27 NOTE — Telephone Encounter (Signed)
Patient left a voicemail requesting a return call regarding his MRI.

## 2019-09-27 NOTE — Telephone Encounter (Signed)
Patient's daughter returned the call and states he will need something to relax him prior to going and when he gets there.   Please send the prescription to [facility] in [place].

## 2019-09-28 MED ORDER — DIAZEPAM 5 MG PO TABS: ORAL_TABLET | ORAL | 0 refills | Status: DC

## 2019-09-28 NOTE — Addendum Note (Signed)
Addended by: Pollyann Savoy on: 09/28/2019 01:05 PM   Modules accepted: Orders

## 2019-10-26 ENCOUNTER — Ambulatory Visit (HOSPITAL_COMMUNITY): Payer: Medicare HMO

## 2019-10-28 ENCOUNTER — Ambulatory Visit (INDEPENDENT_AMBULATORY_CARE_PROVIDER_SITE_OTHER): Payer: Medicare HMO | Admitting: Family Medicine

## 2019-10-28 ENCOUNTER — Encounter: Payer: Self-pay | Admitting: Family Medicine

## 2019-10-28 ENCOUNTER — Other Ambulatory Visit: Payer: Self-pay

## 2019-10-28 VITALS — BP 118/64 | HR 71 | Temp 98.2°F | Ht 69.0 in | Wt 175.0 lb

## 2019-10-28 DIAGNOSIS — R479 Unspecified speech disturbances: Secondary | ICD-10-CM | POA: Diagnosis not present

## 2019-10-28 DIAGNOSIS — R29898 Other symptoms and signs involving the musculoskeletal system: Secondary | ICD-10-CM | POA: Diagnosis not present

## 2019-10-28 NOTE — Progress Notes (Signed)
BP 118/64   Pulse 71   Temp 98.2 F (36.8 C)   Ht 5\' 9"  (1.753 m)   Wt 175 lb (79.4 kg)   SpO2 96%   BMI 25.84 kg/m    Subjective:   Patient ID: Terry Sloan, male    DOB: 08/16/1940, 79 y.o.   MRN: [id number]  HPI: Terry Sloan is a 79 y.o. male presenting on 10/28/2019 for Blood Pressure Check (Monday could not speak, 188/79, Not weak and light headed at that time.  pain in right neck. Has MRI scheduled next weak)   HPI Patient had an episode 4 days ago where he developed difficulty speaking and his blood pressure was up to 188/79.  He said that he was also having a headache and some weakness in one of his hands but denies any facial drooping.  This had been after.  We have been working outside in the garden for a while and he tried to call his daughter and could not speak.  He said it lasted about 15 to 20 minutes and then it went away.  He has not had similar symptoms since but he did have a similar episode of a TIA possibly years ago.  Relevant past medical, surgical, family and social history reviewed and updated as indicated. Interim medical history since our last visit reviewed. Allergies and medications reviewed and updated.  Review of Systems  Constitutional: Negative for chills and fever.  Eyes: Negative for discharge and visual disturbance.  Respiratory: Negative for shortness of breath and wheezing.   Cardiovascular: Negative for chest pain and leg swelling.  Musculoskeletal: Negative for back pain and gait problem.  Skin: Negative for rash.  Neurological: Positive for speech difficulty and weakness. Negative for dizziness and light-headedness.  All other systems reviewed and are negative.   Per HPI unless specifically indicated above   Allergies as of 10/28/2019      Reactions   Celebrex [celecoxib] Other (See Comments)   Leg swelling, broke out in rash      Medication List       Accurate as of Oct 28, 2019  4:25 PM. If you have any questions, ask your nurse  or doctor.        diazepam 5 MG tablet Commonly known as: VALIUM Take 1 tablet by mouth 30 minutes before and  5 minutes before the procedure.   oxybutynin 5 MG tablet Commonly known as: DITROPAN Take 1 tablet (5 mg total) by mouth daily.   pravastatin 40 MG tablet Commonly known as: PRAVACHOL TAKE ONE (1) TABLET EACH DAY   tamsulosin 0.4 MG Caps capsule Commonly known as: FLOMAX Take 1 capsule (0.4 mg total) by mouth daily after supper.        Objective:   BP 118/64   Pulse 71   Temp 98.2 F (36.8 C)   Ht 5\' 9"  (1.753 m)   Wt 175 lb (79.4 kg)   SpO2 96%   BMI 25.84 kg/m   Wt Readings from Last 3 Encounters:  10/28/19 175 lb (79.4 kg)  08/27/19 176 lb (79.8 kg)  08/13/19 176 lb (79.8 kg)    Physical Exam Vitals and nursing note reviewed.  Constitutional:      General: He is not in acute distress.    Appearance: He is well-developed. He is not diaphoretic.  Eyes:     General: No scleral icterus.    Conjunctiva/sclera: Conjunctivae normal.  Neck:     Thyroid: No thyromegaly.  Cardiovascular:  Rate and Rhythm: Normal rate and regular rhythm.     Heart sounds: Normal heart sounds. No murmur.  Pulmonary:     Effort: Pulmonary effort is normal. No respiratory distress.     Breath sounds: Normal breath sounds. No wheezing.  Musculoskeletal:        General: Normal range of motion.     Cervical back: Neck supple.  Lymphadenopathy:     Cervical: No cervical adenopathy.  Skin:    General: Skin is warm and dry.     Findings: No rash.  Neurological:     General: No focal deficit present.     Mental Status: He is alert and oriented to person, place, and time. Mental status is at baseline.     Cranial Nerves: No cranial nerve deficit.     Sensory: No sensory deficit.     Motor: No weakness.     Coordination: Coordination normal.     Gait: Gait normal.  Psychiatric:        Behavior: Behavior normal.       Assessment & Plan:   Problem List Items  Addressed This Visit    None    Visit Diagnoses    Transient speech disturbance    -  Primary   Relevant Orders   MR Brain Wo Contrast   Left arm weakness       Transient, none left   Relevant Orders   MR Brain Wo Contrast      Neuro exam normal but based on his history and having this 2 times, will order an MRI.  Blood pressure looks great today Follow up plan: Return if symptoms worsen or fail to improve.  Counseling provided for all of the vaccine components No orders of the defined types were placed in this encounter.   Caryl Pina, MD [facility] 10/28/2019, 4:25 PM

## 2019-10-29 ENCOUNTER — Telehealth: Payer: Self-pay | Admitting: Family Medicine

## 2019-11-05 ENCOUNTER — Ambulatory Visit (HOSPITAL_COMMUNITY): Payer: Medicare HMO

## 2019-11-05 ENCOUNTER — Ambulatory Visit (HOSPITAL_COMMUNITY): Admission: RE | Admit: 2019-11-05 | Payer: Medicare HMO | Source: Ambulatory Visit

## 2019-11-12 ENCOUNTER — Other Ambulatory Visit: Payer: Self-pay

## 2019-11-12 ENCOUNTER — Ambulatory Visit (HOSPITAL_COMMUNITY)
Admission: RE | Admit: 2019-11-12 | Discharge: 2019-11-12 | Disposition: A | Discharge status: Home / Self Care | Payer: Medicare HMO | Source: Ambulatory Visit | Attending: Family Medicine | Admitting: Family Medicine

## 2019-11-12 DIAGNOSIS — M542 Cervicalgia: Secondary | ICD-10-CM | POA: Insufficient documentation

## 2019-11-12 DIAGNOSIS — R479 Unspecified speech disturbances: Secondary | ICD-10-CM | POA: Diagnosis not present

## 2019-11-12 DIAGNOSIS — G459 Transient cerebral ischemic attack, unspecified: Secondary | ICD-10-CM | POA: Diagnosis not present

## 2019-11-12 DIAGNOSIS — R29898 Other symptoms and signs involving the musculoskeletal system: Secondary | ICD-10-CM | POA: Diagnosis not present

## 2019-11-15 ENCOUNTER — Telehealth: Payer: Self-pay | Admitting: *Deleted

## 2019-11-15 DIAGNOSIS — M542 Cervicalgia: Secondary | ICD-10-CM

## 2019-11-15 NOTE — Telephone Encounter (Signed)
Referral placed.

## 2019-11-15 NOTE — Telephone Encounter (Signed)
-----   Message from Pollyann Savoy, MD sent at 11/15/2019  8:05 AM EDT ----- MRI showed multilevel disc disease and foraminal narrowing.  Patient is having severe discomfort.  Please refer patient to spine specialist.

## 2019-11-15 NOTE — Progress Notes (Signed)
MRI showed multilevel disc disease and foraminal narrowing.  Patient is having severe discomfort.  Please refer patient to spine specialist.

## 2019-12-03 ENCOUNTER — Encounter (INDEPENDENT_AMBULATORY_CARE_PROVIDER_SITE_OTHER): Payer: Medicare HMO | Admitting: Ophthalmology

## 2019-12-17 ENCOUNTER — Encounter (INDEPENDENT_AMBULATORY_CARE_PROVIDER_SITE_OTHER): Payer: Medicare HMO | Admitting: Ophthalmology

## 2019-12-17 ENCOUNTER — Telehealth: Payer: Self-pay | Admitting: Family Medicine

## 2019-12-17 DIAGNOSIS — R479 Unspecified speech disturbances: Secondary | ICD-10-CM

## 2019-12-17 NOTE — Telephone Encounter (Signed)
  REFERRAL REQUEST Telephone Note 12/17/2019  What type of referral do you need? Neurologist  Why do you need this referral? Light-headed/dizzy/slur speech/stroke?  Have you been seen at our office for this problem? Yes (Advise that they may need an appointment with their PCP before a referral can be done)  Is there a particular doctor or location that you prefer? Dr Tawanna Sat  Patient notified that referrals can take up to a week or longer to process. If they haven't heard anything within a week they should call back and speak with the referral department.   Dettinger's pt.  Appt August 9th already  He had 2 mri's already-Dettinger & [facility] ordered.  Can Dr Venetia Maxon see pt for both mri results?  Please call wife.

## 2019-12-20 DIAGNOSIS — H43393 Other vitreous opacities, bilateral: Secondary | ICD-10-CM | POA: Diagnosis not present

## 2019-12-20 DIAGNOSIS — H40033 Anatomical narrow angle, bilateral: Secondary | ICD-10-CM | POA: Diagnosis not present

## 2019-12-21 NOTE — Telephone Encounter (Signed)
LOV 10/28/2019 please advise if referral approved or if patient needs OV

## 2019-12-24 NOTE — Telephone Encounter (Signed)
Daughter calling to checkup on message from 12/17/2019. She has not gotten a cal. Its been a week. Please call back

## 2019-12-27 NOTE — Telephone Encounter (Signed)
Dettinger patient  Note on MRI brain states neurology recommended - Sr. Venetia Maxon is neurosurgeon.  Please review and advise if referral okay to place and which type patient needs.

## 2019-12-28 NOTE — Telephone Encounter (Signed)
Referral to Neurologists placed.  

## 2019-12-29 ENCOUNTER — Telehealth: Payer: Self-pay | Admitting: Family Medicine

## 2020-01-21 ENCOUNTER — Encounter (INDEPENDENT_AMBULATORY_CARE_PROVIDER_SITE_OTHER): Payer: Medicare HMO | Admitting: Ophthalmology

## 2020-01-21 ENCOUNTER — Other Ambulatory Visit: Payer: Self-pay

## 2020-01-21 DIAGNOSIS — H43813 Vitreous degeneration, bilateral: Secondary | ICD-10-CM | POA: Diagnosis not present

## 2020-01-21 DIAGNOSIS — H353132 Nonexudative age-related macular degeneration, bilateral, intermediate dry stage: Secondary | ICD-10-CM | POA: Diagnosis not present

## 2020-01-28 ENCOUNTER — Encounter: Payer: Self-pay | Admitting: Family Medicine

## 2020-01-28 ENCOUNTER — Ambulatory Visit (INDEPENDENT_AMBULATORY_CARE_PROVIDER_SITE_OTHER): Payer: Medicare HMO | Admitting: Family Medicine

## 2020-01-28 ENCOUNTER — Other Ambulatory Visit: Payer: Self-pay

## 2020-01-28 VITALS — BP 105/66 | HR 67 | Temp 97.6°F | Ht 69.0 in | Wt 168.1 lb

## 2020-01-28 DIAGNOSIS — N401 Enlarged prostate with lower urinary tract symptoms: Secondary | ICD-10-CM

## 2020-01-28 DIAGNOSIS — E782 Mixed hyperlipidemia: Secondary | ICD-10-CM | POA: Diagnosis not present

## 2020-01-28 DIAGNOSIS — N3281 Overactive bladder: Secondary | ICD-10-CM

## 2020-01-28 DIAGNOSIS — F419 Anxiety disorder, unspecified: Secondary | ICD-10-CM

## 2020-01-28 MED ORDER — CITALOPRAM HYDROBROMIDE 20 MG PO TABS: 20.0000 mg | ORAL_TABLET | Freq: Every day | ORAL | 5 refills | Status: DC

## 2020-01-28 NOTE — Progress Notes (Signed)
BP 105/66   Pulse 67   Temp 97.6 F (36.4 C)   Ht _0  (1.753 m)   Wt 168 lb 2 oz (76.3 kg)   SpO2 97%   BMI 24.83 kg/m    Subjective:   Patient ID: Terry Sloan, male    DOB: 31-Mar-1941, 79 y.o.   MRN: [id number]  HPI: Terry Sloan is a 79 y.o. male presenting on 01/28/2020 for Medical Management of Chronic Issues   HPI Hyperlipidemia Patient is coming in for recheck of his hyperlipidemia. The patient is currently taking pravastatin. They deny any issues with myalgias or history of liver damage from it. They deny any focal numbness or weakness or chest pain.   BPH and OAB Patient is coming in for recheck on BPH patient does get the occasional orthostatic dizziness but distress get up slowly, likely from the bladder medicine Symptoms: None currently, does not urinate at night and is doing a lot better Medication: Flomax and Ditropan Last PSA: 6 months ago, normal  Patient also comes in complaining of anxiety and difficulty sleeping and stress that is been building up.  His daughter is here with him and says that that has been a big issue that been fighting and he has been like that all his life but just has been getting worse.  Relevant past medical, surgical, family and social history reviewed and updated as indicated. Interim medical history since our last visit reviewed. Allergies and medications reviewed and updated.  Review of Systems  Constitutional: Negative for chills and fever.  Respiratory: Negative for shortness of breath and wheezing.   Cardiovascular: Negative for chest pain and leg swelling.  Musculoskeletal: Negative for back pain and gait problem.  Skin: Negative for rash.  Neurological: Negative for dizziness and light-headedness.  Psychiatric/Behavioral: Positive for sleep disturbance. Negative for self-injury and suicidal ideas. The patient is nervous/anxious.   All other systems reviewed and are negative.   Per HPI unless specifically indicated  above   Allergies as of 01/28/2020      Reactions   Celebrex [celecoxib] Other (See Comments)   Leg swelling, broke out in rash      Medication List       Accurate as of January 28, 2020  4:08 PM. If you have any questions, ask your nurse or doctor.        STOP taking these medications   diazepam 5 MG tablet Commonly known as: VALIUM Stopped by: Fransisca Kaufmann Lafern Brinkley, MD     TAKE these medications   citalopram 20 MG tablet Commonly known as: CeleXA Take 1 tablet (20 mg total) by mouth daily. Started by: Worthy Rancher, MD   oxybutynin 5 MG tablet Commonly known as: DITROPAN Take 1 tablet (5 mg total) by mouth daily.   pravastatin 40 MG tablet Commonly known as: PRAVACHOL TAKE ONE (1) TABLET EACH DAY   tamsulosin 0.4 MG Caps capsule Commonly known as: FLOMAX Take 1 capsule (0.4 mg total) by mouth daily after supper.        Objective:   BP 105/66   Pulse 67   Temp 97.6 F (36.4 C)   Ht _1  (1.753 m)   Wt 168 lb 2 oz (76.3 kg)   SpO2 97%   BMI 24.83 kg/m   Wt Readings from Last 3 Encounters:  01/28/20 168 lb 2 oz (76.3 kg)  10/28/19 175 lb (79.4 kg)  08/27/19 176 lb (79.8 kg)    Physical Exam Vitals and  nursing note reviewed.  Constitutional:      General: He is not in acute distress.    Appearance: He is well-developed. He is not diaphoretic.  Eyes:     General: No scleral icterus.    Conjunctiva/sclera: Conjunctivae normal.  Neck:     Thyroid: No thyromegaly.  Cardiovascular:     Rate and Rhythm: Normal rate and regular rhythm.     Heart sounds: Normal heart sounds. No murmur heard.   Pulmonary:     Effort: Pulmonary effort is normal. No respiratory distress.     Breath sounds: Normal breath sounds. No wheezing.  Musculoskeletal:        General: Normal range of motion.     Cervical back: Neck supple.  Lymphadenopathy:     Cervical: No cervical adenopathy.  Skin:    General: Skin is warm and dry.     Findings: No rash.  Neurological:      Mental Status: He is alert and oriented to person, place, and time.     Coordination: Coordination normal.  Psychiatric:        Mood and Affect: Mood is anxious. Mood is not depressed.        Behavior: Behavior normal.        Thought Content: Thought content does not include suicidal ideation. Thought content does not include suicidal plan.       Assessment & Plan:   Problem List Items Addressed This Visit      Genitourinary   BPH (benign prostatic hyperplasia)   OAB (overactive bladder)   Relevant Orders   CMP14+EGFR   CBC with Differential/Platelet     Other   Mixed hyperlipidemia - Primary   Relevant Orders   Lipid panel    Other Visit Diagnoses    Anxiety       Relevant Medications   citalopram (CELEXA) 20 MG tablet      Will start citalopram for anxiety, follow-up in a month or 2 to see how its going. Follow up plan: Return if symptoms worsen or fail to improve, for 1 to 39-monthanxiety recheck.  Counseling provided for all of the vaccine components Orders Placed This Encounter  Procedures  . CMP14+EGFR  . Lipid panel  . CBC with Differential/Platelet    JCaryl Pina MD [facility] 01/28/2020, 4:08 PM

## 2020-01-29 LAB — CMP14+EGFR
ALT: 11 IU/L (ref 0–44)
AST: 19 IU/L (ref 0–40)
Albumin/Globulin Ratio: 2 (ref 1.2–2.2)
Albumin: 4.3 g/dL (ref 3.7–4.7)
Alkaline Phosphatase: 76 IU/L (ref 48–121)
BUN/Creatinine Ratio: 15 (ref 10–24)
BUN: 22 mg/dL (ref 8–27)
Bilirubin Total: 0.4 mg/dL (ref 0.0–1.2)
CO2: 23 mmol/L (ref 20–29)
Calcium: 9.4 mg/dL (ref 8.6–10.2)
Chloride: 108 mmol/L — ABNORMAL HIGH (ref 96–106)
Creatinine, Ser: 1.43 mg/dL — ABNORMAL HIGH (ref 0.76–1.27)
GFR calc Af Amer: 53 mL/min/{1.73_m2} — ABNORMAL LOW (ref >59)
GFR calc non Af Amer: 46 mL/min/{1.73_m2} — ABNORMAL LOW (ref >59)
Globulin, Total: 2.1 g/dL (ref 1.5–4.5)
Glucose: 110 mg/dL — ABNORMAL HIGH (ref 65–99)
Potassium: 4.1 mmol/L (ref 3.5–5.2)
Sodium: 142 mmol/L (ref 134–144)
Total Protein: 6.4 g/dL (ref 6.0–8.5)

## 2020-01-29 LAB — CBC WITH DIFFERENTIAL/PLATELET
Basophils Absolute: 0 10*3/uL (ref 0.0–0.2)
Basos: 0 %
EOS (ABSOLUTE): 0.1 10*3/uL (ref 0.0–0.4)
Eos: 2 %
Hematocrit: 37.6 % (ref 37.5–51.0)
Hemoglobin: 12.4 g/dL — ABNORMAL LOW (ref 13.0–17.7)
Immature Grans (Abs): 0 10*3/uL (ref 0.0–0.1)
Immature Granulocytes: 0 %
Lymphocytes Absolute: 1.3 10*3/uL (ref 0.7–3.1)
Lymphs: 25 %
MCH: 31.1 pg (ref 26.6–33.0)
MCHC: 33 g/dL (ref 31.5–35.7)
MCV: 94 fL (ref 79–97)
Monocytes Absolute: 0.6 10*3/uL (ref 0.1–0.9)
Monocytes: 11 %
Neutrophils Absolute: 3 10*3/uL (ref 1.4–7.0)
Neutrophils: 62 %
Platelets: 219 10*3/uL (ref 150–450)
RBC: 3.99 x10E6/uL — ABNORMAL LOW (ref 4.14–5.80)
RDW: 12.3 % (ref 11.6–15.4)
WBC: 4.9 10*3/uL (ref 3.4–10.8)

## 2020-01-29 LAB — LIPID PANEL
Chol/HDL Ratio: 3.2 ratio (ref 0.0–5.0)
Cholesterol, Total: 133 mg/dL (ref 100–199)
HDL: 41 mg/dL (ref >39)
LDL Chol Calc (NIH): 77 mg/dL (ref 0–99)
Triglycerides: 74 mg/dL (ref 0–149)
VLDL Cholesterol Cal: 15 mg/dL (ref 5–40)

## 2020-02-04 ENCOUNTER — Telehealth: Payer: Self-pay | Admitting: Family Medicine

## 2020-02-04 NOTE — Telephone Encounter (Signed)
Daughter wants to know which is suppose to take in the morning and at night--oxybutynin (DITROPAN) 5 MG tablet and tamsulosin (FLOMAX) 0.4 MG CAPS capsule. Please call back

## 2020-02-04 NOTE — Telephone Encounter (Signed)
Daughter aware of directions

## 2020-02-08 ENCOUNTER — Telehealth: Payer: Self-pay

## 2020-03-10 ENCOUNTER — Ambulatory Visit: Payer: Medicare HMO | Admitting: Family Medicine

## 2020-04-17 ENCOUNTER — Other Ambulatory Visit: Payer: Self-pay | Admitting: Family Medicine

## 2020-04-17 DIAGNOSIS — E782 Mixed hyperlipidemia: Secondary | ICD-10-CM

## 2020-07-04 ENCOUNTER — Ambulatory Visit (INDEPENDENT_AMBULATORY_CARE_PROVIDER_SITE_OTHER): Payer: Medicare HMO | Admitting: Nurse Practitioner

## 2020-07-04 ENCOUNTER — Encounter: Payer: Self-pay | Admitting: Nurse Practitioner

## 2020-07-04 DIAGNOSIS — J069 Acute upper respiratory infection, unspecified: Secondary | ICD-10-CM

## 2020-07-04 MED ORDER — AMOXICILLIN-POT CLAVULANATE 875-125 MG PO TABS: 1.0000 | ORAL_TABLET | Freq: Two times a day (BID) | ORAL | 0 refills | Status: DC

## 2020-07-04 NOTE — Progress Notes (Signed)
Virtual Visit via telephone Note Due to COVID-19 pandemic this visit was conducted virtually. This visit type was conducted due to national recommendations for restrictions regarding the COVID-19 Pandemic (e.g. social distancing, sheltering in place) in an effort to limit this patient's exposure and mitigate transmission in our community. All issues noted in this document were discussed and addressed.  A physical exam was not performed with this format.  I connected with Terry Sloan on 07/04/20 at 10:10 by telephone and verified that I am speaking with the correct person using two identifiers. Terry Sloan is currently located at home and no one is currently with him during visit. The provider, [name], FNP is located in their office at time of visit.  I discussed the limitations, risks, security and privacy concerns of performing an evaluation and management service by telephone and the availability of in person appointments. I also discussed with the patient that there may be a patient responsible charge related to this service. The patient expressed understanding and agreed to proceed.   History and Present Illness:   Chief Complaint: Sinusitis   HPI Patient calls in today c/o cough and congestion that started 2 weeks ago. Has slight wheezing. He says he really does not feel all that bad. He denies being exposed to covid. Has not had covid vaccine.    Review of Systems  Constitutional: Negative for chills and fever.  HENT: Positive for congestion and sore throat. Negative for sinus pain.   Respiratory: Positive for cough and sputum production (occasionally). Negative for shortness of breath.   Musculoskeletal: Negative for myalgias.  Neurological: Negative for dizziness and headaches.     Observations/Objective: Alert and oriented- answers all questions appropriately No distress Voice hoarse No cough during appointment   Assessment and Plan: Terry Sloan in today  with chief complaint of Sinusitis   1. URI with cough and congestion 1. Take meds as prescribed 2. Use a cool mist humidifier especially during the winter months and when heat has been humid. 3. Use saline nose sprays frequently 4. Saline irrigations of the nose can be very helpful if done frequently.  * 4X daily for 1 week*  * Use of a nettie pot can be helpful with this. Follow directions with this* 5. Drink plenty of fluids 6. Keep thermostat turn down low 7.For any cough or congestion  Use plain Mucinex- regular strength or max strength is fine   * Children- consult with Pharmacist for dosing 8. For fever or aces or pains- take tylenol or ibuprofen appropriate for age and weight.  * for fevers greater than 101 orally you may alternate ibuprofen and tylenol every  3 hours.   Meds ordered this encounter  Medications  . amoxicillin-clavulanate (AUGMENTIN) 875-125 MG tablet    Sig: Take 1 tablet by mouth 2 (two) times daily.    Dispense:  14 tablet    Refill:  0    Order Specific Question:   Supervising Provider    Answer:   Arville Care A [[id number]]         Follow Up Instructions: prn    I discussed the assessment and treatment plan with the patient. The patient was provided an opportunity to ask questions and all were answered. The patient agreed with the plan and demonstrated an understanding of the instructions.   The patient was advised to call back or seek an in-person evaluation if the symptoms worsen or if the condition fails to improve as anticipated.  The above assessment and management plan was discussed with the patient. The patient verbalized understanding of and has agreed to the management plan. Patient is aware to call the clinic if symptoms persist or worsen. Patient is aware when to return to the clinic for a follow-up visit. Patient educated on when it is appropriate to go to the emergency department.   Time call ended:  10:23  I provided 13 minutes  of non-face-to-face time during this encounter.    [name], FNP

## 2020-08-10 ENCOUNTER — Emergency Department (HOSPITAL_COMMUNITY): Payer: Medicare HMO

## 2020-08-10 ENCOUNTER — Other Ambulatory Visit: Payer: Self-pay

## 2020-08-10 ENCOUNTER — Emergency Department (HOSPITAL_COMMUNITY)
Admission: EM | Admit: 2020-08-10 | Discharge: 2020-08-10 | Disposition: A | Discharge status: Home / Self Care | Payer: Medicare HMO | Attending: Emergency Medicine | Admitting: Emergency Medicine

## 2020-08-10 ENCOUNTER — Encounter (HOSPITAL_COMMUNITY): Payer: Self-pay | Admitting: *Deleted

## 2020-08-10 DIAGNOSIS — Z87891 Personal history of nicotine dependence: Secondary | ICD-10-CM | POA: Insufficient documentation

## 2020-08-10 DIAGNOSIS — W01198A Fall on same level from slipping, tripping and stumbling with subsequent striking against other object, initial encounter: Secondary | ICD-10-CM | POA: Diagnosis not present

## 2020-08-10 DIAGNOSIS — Z96652 Presence of left artificial knee joint: Secondary | ICD-10-CM | POA: Insufficient documentation

## 2020-08-10 DIAGNOSIS — M25562 Pain in left knee: Secondary | ICD-10-CM | POA: Diagnosis not present

## 2020-08-10 DIAGNOSIS — W19XXXA Unspecified fall, initial encounter: Secondary | ICD-10-CM

## 2020-08-10 DIAGNOSIS — M5432 Sciatica, left side: Secondary | ICD-10-CM | POA: Insufficient documentation

## 2020-08-10 DIAGNOSIS — M25552 Pain in left hip: Secondary | ICD-10-CM | POA: Insufficient documentation

## 2020-08-10 DIAGNOSIS — Z8673 Personal history of transient ischemic attack (TIA), and cerebral infarction without residual deficits: Secondary | ICD-10-CM | POA: Insufficient documentation

## 2020-08-10 DIAGNOSIS — M533 Sacrococcygeal disorders, not elsewhere classified: Secondary | ICD-10-CM | POA: Diagnosis not present

## 2020-08-10 DIAGNOSIS — S9002XA Contusion of left ankle, initial encounter: Secondary | ICD-10-CM | POA: Insufficient documentation

## 2020-08-10 DIAGNOSIS — S99912A Unspecified injury of left ankle, initial encounter: Secondary | ICD-10-CM | POA: Diagnosis present

## 2020-08-10 DIAGNOSIS — M1612 Unilateral primary osteoarthritis, left hip: Secondary | ICD-10-CM | POA: Diagnosis not present

## 2020-08-10 DIAGNOSIS — I10 Essential (primary) hypertension: Secondary | ICD-10-CM | POA: Diagnosis not present

## 2020-08-10 DIAGNOSIS — M47816 Spondylosis without myelopathy or radiculopathy, lumbar region: Secondary | ICD-10-CM | POA: Diagnosis not present

## 2020-08-10 MED ORDER — PREDNISONE 10 MG PO TABS: 40.0000 mg | ORAL_TABLET | Freq: Every day | ORAL | 0 refills | Status: AC

## 2020-08-10 MED ORDER — MORPHINE SULFATE (PF) 2 MG/ML IV SOLN
2.0000 mg | Freq: Once | INTRAVENOUS | Status: AC
  Administered 2020-08-10: 2 mg via INTRAVENOUS
  Filled 2020-08-10: qty 1

## 2020-08-10 MED ORDER — LIDOCAINE 5 % EX PTCH: 1.0000 | MEDICATED_PATCH | CUTANEOUS | 0 refills | Status: DC

## 2020-08-10 MED ORDER — HYDROMORPHONE HCL 1 MG/ML IJ SOLN
0.5000 mg | Freq: Once | INTRAMUSCULAR | Status: AC
  Administered 2020-08-10: 0.5 mg via INTRAVENOUS
  Filled 2020-08-10: qty 1

## 2020-08-10 NOTE — ED Provider Notes (Addendum)
[facility] EMERGENCY DEPARTMENT Provider Note   CSN: [id number] Arrival date & time: 08/10/20  1638     History Chief Complaint  Patient presents with  . Knee Pain    Terry Sloan is a 80 y.o. male history of GERD, arthritis, kidney stone disease, hyperlipidemia, macular degeneration, TIA, PVD, BPH.  Patient presents today for left leg pain.  Patient reports several years ago he had left knee replacement, since that time he has had intermittent left knee pain which will occasionally cause his leg to give out on him causing him to lose his balance.  Patient reports that over the weekend he was outside chopping wood with a family member, when the wood split it fell and hit him on his left lower leg, this caused him to have some increased left knee pain and he began to lose his balance.  He reports that his family member was able to catch him and stop him from falling all the way to the ground.  Patient reports that since that time he has had constant left hip pain he reports it is sharp severe occasionally rating down to his left knee worsened with movement and palpation no alleviating factors.  Patient reports that he feels that when the wood hit his leg that this may have caused his knee replacement to come out.  Patient arrives with his family member who corroborates story.  Denies history of head injury, loss of consciousness, headache, blood thinner use, neck pain, back pain, chest pain, abdominal pain, numbness/weakness, tingling, saddle paresthesias, bowel/bladder incontinence, urinary retention or any additional concerns  HPI     Past Medical History:  Diagnosis Date  . Arthritis   . GERD (gastroesophageal reflux disease)    [name]  . History of kidney stones   . Hyperlipidemia   . Macular degeneration   . PVD (peripheral vascular disease) (HCC)   . Stroke [facility])    TIA      Patient Active Problem List   Diagnosis Date Noted  . OAB (overactive bladder) 01/25/2019  . BPH  (benign prostatic hyperplasia) 07/27/2018  . Overweight (BMI 25.0-29.9) 01/23/2018  . History of total left knee replacement 04/17/2017  . History of TIA (transient ischemic attack) 10/16/2016  . Aphasia 10/16/2016  . Anxiety and depression 10/16/2016  . PVD (peripheral vascular disease) (HCC) 09/23/2016  . Mixed hyperlipidemia 09/23/2016  . Weight loss, unintentional 09/23/2016    Past Surgical History:  Procedure Laterality Date  . Bone turmor Left    foreheah  . EYE SURGERY Bilateral    cataract removal  . FRACTURE SURGERY Left    leg   pins  . KNEE ARTHROPLASTY    . KNEE SURGERY Left   . NECK SURGERY     Tumor reomoved from head/neck in the 60s  . REPLACEMENT TOTAL KNEE Left 07/14/2018  . ROTATOR CUFF REPAIR Right    times 2  . TOTAL KNEE ARTHROPLASTY WITH HARDWARE REMOVAL Left 02/14/2017   Procedure: Removal Left Tibia Nail, Cemented Total Knee Arthroplasty;  Surgeon: Eldred Manges, MD;  Location: [facility] OR;  Service: Orthopedics;  Laterality: Left;       Family History  Problem Relation Age of Onset  . Stroke Neg Hx     Social History   Tobacco Use  . Smoking status: Former Smoker    Packs/day: 1.50    Years: 20.00    Pack years: 30.00    Types: Cigarettes  . Smokeless tobacco: Never Used  .  Tobacco comment: Quit 20+ year ago  Vaping Use  . Vaping Use: Never used  Substance Use Topics  . Alcohol use: Yes    Comment: Occasional beer  . Drug use: No    Home Medications Prior to Admission medications   Medication Sig Start Date End Date Taking? Authorizing Provider  lidocaine (LIDODERM) 5 % Place 1 patch onto the skin daily. Remove & Discard patch within 12 hours or as directed by MD 08/10/20  Yes Harlene Salts A, PA-C  oxybutynin (DITROPAN) 5 MG tablet Take 1 tablet (5 mg total) by mouth daily. 08/27/19  Yes Bjorn Pippin, MD  pravastatin (PRAVACHOL) 40 MG tablet TAKE ONE (1) TABLET EACH DAY Patient taking differently: Take 40 mg by mouth daily. 04/17/20   Yes Dettinger, Elige Radon, MD  predniSONE (DELTASONE) 10 MG tablet Take 4 tablets (40 mg total) by mouth daily for 5 days. 08/10/20 08/15/20 Yes Harlene Salts A, PA-C  tamsulosin (FLOMAX) 0.4 MG CAPS capsule Take 1 capsule (0.4 mg total) by mouth daily after supper. 07/30/19  Yes Dettinger, Elige Radon, MD  amoxicillin-clavulanate (AUGMENTIN) 875-125 MG tablet Take 1 tablet by mouth 2 (two) times daily. Patient not taking: Reported on 08/10/2020 07/04/20   Bennie Pierini, FNP  citalopram (CELEXA) 20 MG tablet Take 1 tablet (20 mg total) by mouth daily. Patient not taking: Reported on 08/10/2020 01/28/20   Dettinger, Elige Radon, MD    Allergies    Celebrex [celecoxib]  Review of Systems   Review of Systems Ten systems are reviewed and are negative for acute change except as noted in the HPI  Physical Exam Updated Vital Signs BP 126/62   Pulse 60   Temp 98.8 F (37.1 C) (Oral)   Resp 16   SpO2 98%   Physical Exam Constitutional:      General: He is not in acute distress.    Appearance: Normal appearance. He is well-developed. He is not ill-appearing or diaphoretic.  HENT:     Head: Normocephalic and atraumatic.  Eyes:     General: Vision grossly intact. Gaze aligned appropriately.     Pupils: Pupils are equal, round, and reactive to light.  Neck:     Trachea: Trachea and phonation normal.  Cardiovascular:     Rate and Rhythm: Normal rate and regular rhythm.     Pulses:          Dorsalis pedis pulses are 2+ on the right side and 2+ on the left side.  Pulmonary:     Effort: Pulmonary effort is normal. No respiratory distress.  Abdominal:     General: There is no distension.     Palpations: Abdomen is soft.     Tenderness: There is no abdominal tenderness. There is no guarding or rebound.  Musculoskeletal:        General: Normal range of motion.     Cervical back: Normal range of motion.       Back:     Comments: No midline C/T/L spinal tenderness to palpation, no paraspinal  muscle tenderness, no deformity, crepitus, or step-off noted. No sign of injury to the neck or back. - Point tenderness over left gluteal muscles without overlying skin change.  Patient is able to flex and extend at the left hip with minimal pain.  Positive left straight leg raise.  Well-healed surgical scar over left knee no evidence of injury or infection.  Flexion extension at the left knee intact without pain.  Small amount of bruising around the left  medial ankle flexion and extension inversion eversion intact of the left ankle without pain.  No swelling or skin break.  Capillary refill and sensation intact to all toes.  Strong equal pedal pulses.  Feet:     Right foot:     Protective Sensation: 3 sites tested. 3 sites sensed.     Left foot:     Protective Sensation: 3 sites tested. 3 sites sensed.  Skin:    General: Skin is warm and dry.  Neurological:     Mental Status: He is alert.     GCS: GCS eye subscore is 4. GCS verbal subscore is 5. GCS motor subscore is 6.     Comments: Speech is clear and goal oriented, follows commands Major Cranial nerves without deficit, no facial droop Moves extremities without ataxia, coordination intact  Psychiatric:        Behavior: Behavior normal.     ED Results / Procedures / Treatments   Labs (all labs ordered are listed, but only abnormal results are displayed) Labs Reviewed - No data to display  EKG None  Radiology CT Hip Left Wo Contrast  Result Date: 08/10/2020 CLINICAL DATA:  Recent fall with left hip pain and normal x-rays, initial encounter EXAM: CT OF THE LEFT HIP WITHOUT CONTRAST TECHNIQUE: Multidetector CT imaging of the left hip was performed according to the standard protocol. Multiplanar CT image reconstructions were also generated. COMPARISON:  Plain film from earlier in the same day. FINDINGS: Bones/Joint/Cartilage Degenerative changes are noted in the left sacroiliac joint. Visualized pelvic ring appears intact. No acetabular  abnormality is seen. Proximal left femur is well visualized and within normal limits. No acute fracture is seen. Ligaments Suboptimally assessed by CT. Muscles and Tendons Surrounding musculature appears within normal limits. Soft tissues Soft tissue structures within the pelvis appear within normal limits. No hematoma is seen. No significant joint effusion is seen. IMPRESSION: No evidence of acute fracture or soft tissue abnormality. Electronically Signed   By: Alcide Clever M.D.   On: 08/10/2020 20:55   DG Knee Complete 4 Views Left  Result Date: 08/10/2020 CLINICAL DATA:  Recent fall with left knee pain, initial encounter EXAM: LEFT KNEE - COMPLETE 4+ VIEW COMPARISON:  05/15/2017 FINDINGS: Interval revision of left knee prosthesis is noted. No loosening is seen. No acute fracture or dislocation is noted. No soft tissue abnormality is seen. IMPRESSION: Left knee replacement without acute abnormality. Electronically Signed   By: Alcide Clever M.D.   On: 08/10/2020 18:33   DG HIP UNILAT WITH PELVIS 2-3 VIEWS LEFT  Result Date: 08/10/2020 CLINICAL DATA:  Recent fall with knee pain and hip pain, initial encounter EXAM: DG HIP (WITH OR WITHOUT PELVIS) 3V LEFT COMPARISON:  None. FINDINGS: Pelvic ring is intact. No acute fracture or dislocation is noted. No soft tissue abnormality is seen. Mild degenerative changes of the hip joints are noted bilaterally. Mild degenerative change in the lumbar spine is seen as well. IMPRESSION: Mild degenerative change without acute abnormality. Electronically Signed   By: Alcide Clever M.D.   On: 08/10/2020 18:32    Procedures Procedures   Medications Ordered in ED Medications  morphine 2 MG/ML injection 2 mg (2 mg Intravenous Given 08/10/20 1923)  HYDROmorphone (DILAUDID) injection 0.5 mg (0.5 mg Intravenous Given 08/10/20 2126)    ED Course  I have reviewed the triage vital signs and the nursing notes.  Pertinent labs & imaging results that were available during my  care of the patient were reviewed  by me and considered in my medical decision making (see chart for details).    MDM Rules/Calculators/A&P                         Additional history obtained from: 1. Nursing notes from this visit. 2. Review of EMR. 3. Family member. ========= 80 year old male presented with left leg pain onset few days ago.  He reports chronic left knee pain and his knee often gives out on him.  His knee gave out on him while he was chopping wood the other day and a logrolled up onto his left ankle causing a small bruise of the area.  He has no pain about the ankle full range of motion and strength of the area no indication for imaging of the ankle at this time.  Patient's main concern is pain at the left hip which radiates down to his left knee.  On exam but this seems consistent with sciatica he is point tender at the left gluteal muscles and pain radiates down to the knee.  He has no back pain or evidence of injury on exam.  He has no complaints to suggest cauda equina, spinal dural abscess, AAA or other emergent pathologies.  X-rays of the left knee and left hip were obtained in triage and are both negative for acute findings.  Given patient's advanced age did consider possible occult fracture of the left hip given his significant tenderness of the area and a CT of the left hip was performed which also did not show any emergent findings.  Patient is neurovascularly intact the leg no evidence for fracture/dislocation, compartment syndrome, cellulitis, septic arthritis, neurovascular compromise or other emergent pathologies at this time.  Patient's pain was controlled in the ER and he was ambulatory with minimal assistance.  Patient and his daughter both report that he lives with his girlfriend as well as one of his kids and they are able to help with ADLs.  Suspect patient symptoms today are secondary to left-sided sciatica, will start patient on course of prednisone as well as Lidoderm  he may also use OTC acetaminophen to help with symptoms.  Patient denies any history of diabetes or adverse reaction to steroid medications additionally he denies any history of severe GERD or gastric ulcers to contraindicate steroids.  Additionally triage documented a fall, further history from both patient and his daughter reports the patient did not truly fall he did lose his balance but was helped to the ground by a family member.  There is no head injury and patient is not on a blood thinner denies injuries elsewhere on the body there is no indication for further work-up at this time.   At this time there does not appear to be any evidence of an acute emergency medical condition and the patient appears stable for discharge with appropriate outpatient follow up. Diagnosis was discussed with patient who verbalizes understanding of care plan and is agreeable to discharge. I have discussed return precautions with patient and daughter who verbalizes understanding. Patient encouraged to follow-up with their PCP. All questions answered.  Patient's case discussed with Dr. Estell Harpin who agrees with plan to discharge with follow-up.  - Addendum: Of note small scab present to the left medial malleolus no overlying tenderness this is where the patient's bruise was he reports from the patient what hitting his ankle last weekend.  Patient reports his tetanus shot is up-to-date.  There are no overlying signs of infection.  Note: Portions of this report may have been transcribed using voice recognition software. Every effort was made to ensure accuracy; however, inadvertent computerized transcription errors may still be present. Final Clinical Impression(s) / ED Diagnoses Final diagnoses:  Left sciatic nerve pain  Contusion of left ankle, initial encounter    Rx / DC Orders ED Discharge Orders         Ordered    predniSONE (DELTASONE) 10 MG tablet  Daily        08/10/20 2254    lidocaine (LIDODERM) 5 %   Every 24 hours        08/10/20 2254           Bill SalinasMorelli, Marrion Accomando A, PA-C 08/10/20 2255    Bill SalinasMorelli, Emelio Schneller A, PA-C 08/10/20 2258    Bethann BerkshireZammit, Joseph, MD 08/13/20 1130

## 2020-08-10 NOTE — ED Triage Notes (Signed)
Pt with left knee pain with ambulating today.  Pt with recent fall and landed on left hip couple days ago.  Pt states pain radiates from knee to hip.  Pt with hx of two left knee replacements and states knee gave out that caused him to fall. Pt screaming and restless due to pain.

## 2020-08-10 NOTE — Discharge Instructions (Addendum)
At this time there does not appear to be the presence of an emergent medical condition, however there is always the potential for conditions to change. Please read and follow the below instructions.  Please return to the Emergency Department immediately for any new or worsening symptoms. Please see your primary care provider this week for recheck. You may use the medication prednisone as prescribed to help with your sciatica symptoms.  Please drink plenty water and get plenty of rest. You may use the Lidoderm patch as prescribed to help with your symptoms.  Lidoderm may be expensive so you may speak with your pharmacist about finding over-the-counter medications that work similarly.   Go to the nearest Emergency Department immediately if: You have fever or chills You cannot control when you pee (urinate) or poop (have a bowel movement). You have weakness in any of these areas and it gets worse: Lower back. The area between your hip bones. Butt. Legs. You have redness or swelling of your back. You have a burning feeling when you pee. You have any new/concerning or worsening of symptoms  Please read the additional information packets attached to your discharge summary.  Do not take your medicine if  develop an itchy rash, swelling in your mouth or lips, or difficulty breathing; call 911 and seek immediate emergency medical attention if this occurs.  You may review your lab tests and imaging results in their entirety on your MyChart account.  Please discuss all results of fully with your primary care provider and other specialist at your follow-up visit.  Note: Portions of this text may have been transcribed using voice recognition software. Every effort was made to ensure accuracy; however, inadvertent computerized transcription errors may still be present.

## 2020-08-10 NOTE — ED Notes (Signed)
Pt undressed and into a gown

## 2020-08-11 ENCOUNTER — Other Ambulatory Visit: Payer: Self-pay | Admitting: Urology

## 2020-08-14 ENCOUNTER — Telehealth: Payer: Self-pay

## 2020-08-14 ENCOUNTER — Ambulatory Visit (INDEPENDENT_AMBULATORY_CARE_PROVIDER_SITE_OTHER): Payer: Medicare HMO

## 2020-08-14 ENCOUNTER — Other Ambulatory Visit: Payer: Self-pay

## 2020-08-14 ENCOUNTER — Encounter: Payer: Self-pay | Admitting: Nurse Practitioner

## 2020-08-14 ENCOUNTER — Ambulatory Visit (INDEPENDENT_AMBULATORY_CARE_PROVIDER_SITE_OTHER): Payer: Medicare HMO | Admitting: Nurse Practitioner

## 2020-08-14 VITALS — BP 107/65 | HR 74 | Temp 98.9°F | Resp 20

## 2020-08-14 DIAGNOSIS — M25572 Pain in left ankle and joints of left foot: Secondary | ICD-10-CM

## 2020-08-14 MED ORDER — TRAMADOL HCL 50 MG PO TABS: 50.0000 mg | ORAL_TABLET | Freq: Two times a day (BID) | ORAL | 0 refills | Status: AC

## 2020-08-14 NOTE — Progress Notes (Signed)
   Subjective:    Patient ID: Terry Sloan, male    DOB: 1941-03-22, 80 y.o.   MRN: [id number]   Chief Complaint: Ankle Pain   HPI Patient comes in today c/o left ankle pain. He wa spitting wood and a piece of wood fell and hit his left leg and ankle.    Review of Systems  Musculoskeletal: Positive for arthralgias (left ankle and lower leg) and gait problem.  All other systems reviewed and are negative.      Objective:   Physical Exam Vitals and nursing note reviewed.  Constitutional:      Appearance: Normal appearance.  Musculoskeletal:     Comments: palpable nodule left mid tibia- has been there since previous fracture Left medial ankle edema Pain in ext and flexion of left foot  Skin:    Comments: contusion on left medial ankle  Neurological:     Mental Status: He is alert.     BP 107/65   Pulse 74   Temp 98.9 F (37.2 C) (Temporal)   Resp 20   Left ankle xray- waiting on radiology report      Assessment & Plan:  Terry Sloan in today with chief complaint of Ankle Pain   1. Acute left ankle pain Wrapped with ace Elevated and ice Will call with xray report - DG Ankle Complete Left - traMADol (ULTRAM) 50 MG tablet; Take 1 tablet (50 mg total) by mouth 2 (two) times daily for 5 days.  Dispense: 40 tablet; Refill: 0    The above assessment and management plan was discussed with the patient. The patient verbalized understanding of and has agreed to the management plan. Patient is aware to call the clinic if symptoms persist or worsen. Patient is aware when to return to the clinic for a follow-up visit. Patient educated on when it is appropriate to go to the emergency department.   Terry Daphine Deutscher, FNP

## 2020-08-14 NOTE — Telephone Encounter (Signed)
Pt was seen in the ER this past Thursday for Sciatic Nerve pain. Pt also complaining about his left ankle due to a piece of wood hitting his ankle. Daughter unsure if his ankle was checked at ER. Says pt is in so much pain he is unable to move around/stand. Wants to know if Dr Dettinger can go a telephone visit with them since he cant come in?  Please advise and call Natasha. [phone]  (for telephone visit, call [phone])

## 2020-08-14 NOTE — Telephone Encounter (Signed)
Pt scheduled with MMM at 12:30 today.

## 2020-08-14 NOTE — Telephone Encounter (Signed)
If ankle is hurting to the point where there concerned about it that he would need to be seen in person for an x-ray anyways to make sure is not fractured so I would recommend an in person visit.  I do not know that a telephone visit is going to help much because we would mainly want to see if it is fractured or not especially if they did not x-ray the ankle in the ER.

## 2020-08-14 NOTE — Patient Instructions (Signed)
Ankle Sprain  An ankle sprain is a stretch or tear in one of the tough tissues (ligaments) that connect the bones in your ankle. An ankle sprain can happen when the ankle rolls outward (inversion sprain) or inward (eversion sprain). What are the causes? This condition is caused by rolling or twisting the ankle. What increases the risk? You are more likely to develop this condition if you play sports. What are the signs or symptoms? Symptoms of this condition include:  Pain in your ankle.  Swelling.  Bruising. This may happen right after you sprain your ankle or 1-2 days later.  Trouble standing or walking. How is this diagnosed? This condition is diagnosed with:  A physical exam. During the exam, your doctor will press on certain parts of your foot and ankle and try to move them in certain ways.  X-ray imaging. These may be taken to see how bad the sprain is and to check for broken bones. How is this treated? This condition may be treated with:  A brace or splint. This is used to keep the ankle from moving until it heals.  An elastic bandage. This is used to support the ankle.  Crutches.  Pain medicine.  Surgery. This may be needed if the sprain is very bad.  Physical therapy. This may help to improve movement in the ankle. Follow these instructions at home: If you have a brace or a splint:  Wear the brace or splint as told by your doctor. Remove it only as told by your doctor.  Loosen the brace or splint if your toes: ? Tingle. ? Lose feeling (become numb). ? Turn cold and blue.  Keep the brace or splint clean.  If the brace or splint is not waterproof: ? Do not let it get wet. ? Cover it with a watertight covering when you take a bath or a shower. If you have an elastic bandage (dressing):  Remove it to shower or bathe.  Try not to move your ankle much, but wiggle your toes from time to time. This helps to prevent swelling.  Adjust the dressing if it feels  too tight.  Loosen the dressing if your foot: ? Loses feeling. ? Tingles. ? Becomes cold and blue. Managing pain, stiffness, and swelling  Take over-the-counter and prescription medicines only as told by doctor.  For 2-3 days, keep your ankle raised (elevated) above the level of your heart.  If told, put ice on the injured area: ? If you have a removable brace or splint, remove it as told by your doctor. ? Put ice in a plastic bag. ? Place a towel between your skin and the bag. ? Leave the ice on for 20 minutes, 2-3 times a day.   General instructions  Rest your ankle.  Do not use your injured leg to support your body weight until your doctor says that you can. Use crutches as told by your doctor.  Do not use any products that contain nicotine or tobacco, such as cigarettes, e-cigarettes, and chewing tobacco. If you need help quitting, ask your doctor.  Keep all follow-up visits as told by your doctor. Contact a doctor if:  Your bruises or swelling are quickly getting worse.  Your pain does not get better after you take medicine. Get help right away if:  You cannot feel your toes or foot.  Your foot or toes look blue.  You have very bad pain that gets worse. Summary  An ankle sprain is a   stretch or tear in one of the tough tissues (ligaments) that connect the bones in your ankle.  This condition is caused by rolling or twisting the ankle.  Symptoms include pain, swelling, bruising, and trouble walking.  To help with pain and swelling, put ice on the injured ankle, raise your ankle above the level of your heart, and use an elastic bandage. Also, rest as told by your doctor.  Keep all follow-up visits as told by your doctor. This is important. This information is not intended to replace advice given to you by your health care provider. Make sure you discuss any questions you have with your health care provider. Document Revised: 10/14/2017 Document Reviewed:  10/14/2017 Elsevier Patient Education  2021 Elsevier Inc.  

## 2020-08-21 NOTE — Progress Notes (Signed)
Subjective:  1. Benign prostatic hyperplasia with lower urinary tract symptoms, symptom details unspecified   2. Urgency of urination      Terry Sloan is a 80 year-old male established patient who is here for symptoms of enlarged prostate.    Terry Sloan returns today in f/u. He had a negative prostate biopsy on 11/21/17 for a prostate nodule. He has had resolution of his voiding symptoms with the tamsulosin and oxybutynin he was given for BPH with BOO.  He has no further GI complaints.  HIs IPSS is 7. He has nocturia x12 and he has some urgency but he has a good stream.  He has had no hematuria or dysuria.   His PSA is 1.0 with 45% f/t ratio on 07/30/19.   He has no associated signs or symptoms.  He was unable to get a UA today.    GU Hx: Terry Sloan is a 80 yo WM who is sent in consultation by Dr. Ermalinda Sloan for LUTs with incontinence. He has a several year history of progressive voiding symptoms. He had hesitancy with a reduced stream. The symptoms got worse about 2 years ago following a circumcision in [place]. He has not been able to get an erection since. He was started on Tamsulosin on 10/16/17 and his flow improved rapidly but he began to have nocturnal enuresis. He wakes having incontinence. He has no hematuria or dysuria. He feels like empties and has no intermittency. He has some daytime frequency. He has nocturia x 1. He has had no UTI's. He has had two prior stones. He passed them both. He has had no other GU surgery. His PSA was 2.4 2 years ago but only 1.2 this month. His UA is ok today.     IPSS    Row Name 08/24/20 1400         International Prostate Symptom Score   How often have you had the sensation of not emptying your bladder? Less than 1 in 5     How often have you had to urinate less than every two hours? Less than 1 in 5 times     How often have you found you stopped and started again several times when you urinated? Less than 1 in 5 times     How often have you found it difficult  to postpone urination? Less than 1 in 5 times     How often have you had a weak urinary stream? Less than 1 in 5 times     How often have you had to strain to start urination? Less than 1 in 5 times     How many times did you typically get up at night to urinate? 1 Time     Total IPSS Score 7           Quality of Life due to urinary symptoms   If you were to spend the rest of your life with your urinary condition just the way it is now how would you feel about that? Pleased             ROS:  ROS:  A complete review of systems was performed.  All systems are negative except for pertinent findings as noted.   Review of Systems  Neurological: Positive for dizziness.  All other systems reviewed and are negative.   Allergies  Allergen Reactions  . Celebrex [Celecoxib] Other (See Comments)    Leg swelling, broke out in rash    Outpatient Encounter Medications  as of 08/24/2020  Medication Sig  . oxybutynin (DITROPAN) 5 MG tablet TAKE ONE (1) TABLET EACH DAY  . pravastatin (PRAVACHOL) 40 MG tablet TAKE ONE (1) TABLET EACH DAY (Patient taking differently: Take 40 mg by mouth daily.)  . [DISCONTINUED] tamsulosin (FLOMAX) 0.4 MG CAPS capsule Take 1 capsule (0.4 mg total) by mouth daily after supper.  . tamsulosin (FLOMAX) 0.4 MG CAPS capsule Take 1 capsule (0.4 mg total) by mouth daily after supper.  . [DISCONTINUED] citalopram (CELEXA) 20 MG tablet Take 1 tablet (20 mg total) by mouth daily. (Patient not taking: Reported on 08/24/2020)  . [DISCONTINUED] lidocaine (LIDODERM) 5 % Place 1 patch onto the skin daily. Remove & Discard patch within 12 hours or as directed by MD (Patient not taking: Reported on 08/24/2020)   No facility-administered encounter medications on file as of 08/24/2020.    Past Medical History:  Diagnosis Date  . Arthritis   . GERD (gastroesophageal reflux disease)    [name]  . History of kidney stones   . Hyperlipidemia   . Macular degeneration   . PVD  (peripheral vascular disease) (HCC)   . Stroke [facility])    TIA      Past Surgical History:  Procedure Laterality Date  . Bone turmor Left    foreheah  . EYE SURGERY Bilateral    cataract removal  . FRACTURE SURGERY Left    leg   pins  . KNEE ARTHROPLASTY    . KNEE SURGERY Left   . NECK SURGERY     Tumor reomoved from head/neck in the 60s  . REPLACEMENT TOTAL KNEE Left 07/14/2018  . ROTATOR CUFF REPAIR Right    times 2  . TOTAL KNEE ARTHROPLASTY WITH HARDWARE REMOVAL Left 02/14/2017   Procedure: Removal Left Tibia Nail, Cemented Total Knee Arthroplasty;  Surgeon: Eldred Manges, MD;  Location: [facility] OR;  Service: Orthopedics;  Laterality: Left;    Social History   Socioeconomic History  . Marital status: Single    Spouse name: Not on file  . Number of children: 9  . Years of education: Not on file  . Highest education level: Not on file  Occupational History  . Occupation: Retired  Tobacco Use  . Smoking status: Former Smoker    Packs/day: 1.50    Years: 20.00    Pack years: 30.00    Types: Cigarettes  . Smokeless tobacco: Never Used  . Tobacco comment: Quit 20+ year ago  Vaping Use  . Vaping Use: Never used  Substance and Sexual Activity  . Alcohol use: Yes    Comment: Occasional beer  . Drug use: No  . Sexual activity: Not on file  Other Topics Concern  . Not on file  Social History Narrative   Lives at home w/ his significant other   Right-handed   Caffeine: some coffee, 3 diet Mt Dew[employer] per day   Social Determinants of Health   Financial Resource Strain: Not on file  Food Insecurity: Not on file  Transportation Needs: Not on file  Physical Activity: Not on file  Stress: Not on file  Social Connections: Not on file  Intimate Partner Violence: Not on file    Family History  Problem Relation Age of Onset  . Stroke Neg Hx        Objective: Vitals:   08/24/20 1359  BP: 108/61  Pulse: 82  Temp: (!) 97.2 F (36.2 C)     Physical Exam Vitals  reviewed.  Constitutional:  Appearance: Normal appearance.  Neurological:     Mental Status: He is alert.     Lab Results:  No results found for this or any previous visit (from the past 24 hour(s)).  BMET No results for input(s): NA, K, CL, CO2, GLUCOSE, BUN, CREATININE, CALCIUM in the last 72 hours. PSA No results found for: PSA No results found for: TESTOSTERONE    Studies/Results: No results found.    Assessment & Plan: BPH with BOO and urgency.  He is doing well on current therapy.  tamsulosin refilled.  Oxybutynin is current.      Meds ordered this encounter  Medications  . tamsulosin (FLOMAX) 0.4 MG CAPS capsule    Sig: Take 1 capsule (0.4 mg total) by mouth daily after supper.    Dispense:  90 capsule    Refill:  3     Orders Placed This Encounter  Procedures  . Urinalysis, Routine w reflex microscopic      Return in about 1 year (around 08/24/2021).   CC: Dettinger, Elige Radon, MD      Bjorn Pippin 08/24/2020

## 2020-08-24 ENCOUNTER — Ambulatory Visit (INDEPENDENT_AMBULATORY_CARE_PROVIDER_SITE_OTHER): Payer: Medicare HMO | Admitting: Urology

## 2020-08-24 ENCOUNTER — Other Ambulatory Visit: Payer: Self-pay

## 2020-08-24 ENCOUNTER — Encounter: Payer: Self-pay | Admitting: Urology

## 2020-08-24 VITALS — BP 108/61 | HR 82 | Temp 97.2°F | Ht 69.0 in | Wt 164.0 lb

## 2020-08-24 DIAGNOSIS — N401 Enlarged prostate with lower urinary tract symptoms: Secondary | ICD-10-CM

## 2020-08-24 DIAGNOSIS — R3915 Urgency of urination: Secondary | ICD-10-CM

## 2020-08-24 MED ORDER — TAMSULOSIN HCL 0.4 MG PO CAPS: 0.4000 mg | ORAL_CAPSULE | Freq: Every day | ORAL | 3 refills | Status: DC

## 2020-08-25 ENCOUNTER — Ambulatory Visit: Payer: Medicare HMO | Admitting: Urology

## 2020-11-09 DIAGNOSIS — H2513 Age-related nuclear cataract, bilateral: Secondary | ICD-10-CM | POA: Diagnosis not present

## 2020-11-09 DIAGNOSIS — H40033 Anatomical narrow angle, bilateral: Secondary | ICD-10-CM | POA: Diagnosis not present

## 2021-01-22 ENCOUNTER — Encounter (INDEPENDENT_AMBULATORY_CARE_PROVIDER_SITE_OTHER): Payer: Medicare HMO | Admitting: Ophthalmology

## 2021-01-22 ENCOUNTER — Other Ambulatory Visit: Payer: Self-pay

## 2021-01-22 DIAGNOSIS — H43813 Vitreous degeneration, bilateral: Secondary | ICD-10-CM | POA: Diagnosis not present

## 2021-01-22 DIAGNOSIS — H353132 Nonexudative age-related macular degeneration, bilateral, intermediate dry stage: Secondary | ICD-10-CM | POA: Diagnosis not present

## 2021-02-08 ENCOUNTER — Other Ambulatory Visit: Payer: Self-pay | Admitting: Family Medicine

## 2021-02-08 DIAGNOSIS — E782 Mixed hyperlipidemia: Secondary | ICD-10-CM

## 2021-02-23 ENCOUNTER — Encounter: Payer: Self-pay | Admitting: Family Medicine

## 2021-02-23 ENCOUNTER — Other Ambulatory Visit: Payer: Self-pay

## 2021-02-23 ENCOUNTER — Ambulatory Visit (INDEPENDENT_AMBULATORY_CARE_PROVIDER_SITE_OTHER): Payer: Medicare HMO | Admitting: Family Medicine

## 2021-02-23 VITALS — BP 111/64 | HR 73 | Temp 97.0°F | Ht 69.0 in | Wt 162.8 lb

## 2021-02-23 DIAGNOSIS — R413 Other amnesia: Secondary | ICD-10-CM | POA: Diagnosis not present

## 2021-02-23 DIAGNOSIS — F32A Depression, unspecified: Secondary | ICD-10-CM

## 2021-02-23 DIAGNOSIS — F419 Anxiety disorder, unspecified: Secondary | ICD-10-CM

## 2021-02-23 MED ORDER — PAROXETINE HCL 20 MG PO TABS: 20.0000 mg | ORAL_TABLET | Freq: Every day | ORAL | 3 refills | Status: DC

## 2021-02-23 NOTE — Progress Notes (Signed)
BP 111/64   Pulse 73   Temp (!) 97 F (36.1 C) (Temporal)   Ht 5\' 9"  (1.753 m)   Wt 162 lb 12.8 oz (73.8 kg)   SpO2 97%   BMI 24.04 kg/m    Subjective:   Patient ID: Terry Sloan, male    DOB: Nov 12, 1940, 80 y.o.   MRN: [id number]  HPI: Terry Sloan is a 80 y.o. male presenting on 02/23/2021 for No chief complaint on file.   HPI Memory issues and anxiety Patient has been having increased memory issues with anxiety and depression and worsening memory.  She has noticed that some days are good and some days are really bad and some days he is with it and able to make decisions and remember things and then some days eating and doing the same things over and over and over again. MMSE - Mini Mental State Exam 02/23/2021  Orientation to time 2  Orientation to Place 4  Registration 3  Attention/ Calculation 0  Recall 0  Language- name 2 objects 2  Language- repeat 0  Language- follow 3 step command 3  Language- read & follow direction 1  Write a sentence 0  Copy design 0  Total score 15      Relevant past medical, surgical, family and social history reviewed and updated as indicated. Interim medical history since our last visit reviewed. Allergies and medications reviewed and updated.  Review of Systems  Constitutional:  Negative for chills and fever.  Eyes:  Negative for visual disturbance.  Respiratory:  Negative for shortness of breath and wheezing.   Cardiovascular:  Negative for chest pain and leg swelling.  Musculoskeletal:  Negative for back pain and gait problem.  Skin:  Negative for rash.  Psychiatric/Behavioral:  Positive for confusion, decreased concentration and dysphoric mood. Negative for self-injury, sleep disturbance and suicidal ideas. The patient is nervous/anxious.   All other systems reviewed and are negative.  Per HPI unless specifically indicated above   Allergies as of 02/23/2021       Reactions   Celebrex [celecoxib] Other (See Comments)   Leg  swelling, broke out in rash        Medication List        Accurate as of February 23, 2021  4:53 PM. If you have any questions, ask your nurse or doctor.          oxybutynin 5 MG tablet Commonly known as: DITROPAN TAKE ONE (1) TABLET EACH DAY   PARoxetine 20 MG tablet Commonly known as: Paxil Take 1 tablet (20 mg total) by mouth daily. Started by: February 25, 2021 Keisha Amer, MD   pravastatin 40 MG tablet Commonly known as: PRAVACHOL TAKE ONE (1) TABLET EACH DAY (NEEDS TO BE SEEN BEFORE NEXT REFILL)   tamsulosin 0.4 MG Caps capsule Commonly known as: FLOMAX Take 1 capsule (0.4 mg total) by mouth daily after supper.         Objective:   BP 111/64   Pulse 73   Temp (!) 97 F (36.1 C) (Temporal)   Ht 5\' 9"  (1.753 m)   Wt 162 lb 12.8 oz (73.8 kg)   SpO2 97%   BMI 24.04 kg/m   Wt Readings from Last 3 Encounters:  02/23/21 162 lb 12.8 oz (73.8 kg)  08/24/20 164 lb (74.4 kg)  01/28/20 168 lb 2 oz (76.3 kg)    Physical Exam Vitals and nursing note reviewed.  Constitutional:      General: He is not  in acute distress.    Appearance: He is well-developed. He is not diaphoretic.  Eyes:     General: No scleral icterus.    Conjunctiva/sclera: Conjunctivae normal.  Neck:     Thyroid: No thyromegaly.  Cardiovascular:     Rate and Rhythm: Normal rate and regular rhythm.     Heart sounds: Normal heart sounds. No murmur heard. Pulmonary:     Effort: Pulmonary effort is normal. No respiratory distress.     Breath sounds: Normal breath sounds. No wheezing.  Musculoskeletal:     Cervical back: Neck supple.  Lymphadenopathy:     Cervical: No cervical adenopathy.  Skin:    General: Skin is warm and dry.     Findings: Rash (She also has been doing a lot of picking because of his anxiousness special the back of his neck and his forearms, recommended hydrocortisone cream and moisturizing cream) present.  Neurological:     Mental Status: He is alert and oriented to person,  place, and time.     Coordination: Coordination normal.  Psychiatric:        Behavior: Behavior normal.      Assessment & Plan:   Problem List Items Addressed This Visit       Other   Anxiety and depression   Relevant Medications   PARoxetine (PAXIL) 20 MG tablet   Other Visit Diagnoses     Memory difficulties    -  Primary   Relevant Medications   PARoxetine (PAXIL) 20 MG tablet     Will start Paxil and follow-up in a couple months to see how he is doing.  Follow up plan: Return in about 2 months (around 04/25/2021), or if symptoms worsen or fail to improve, for Anxiety and memory.  Counseling provided for all of the vaccine components No orders of the defined types were placed in this encounter.   Arville Care, MD [facility] 02/23/2021, 4:53 PM

## 2021-04-20 ENCOUNTER — Ambulatory Visit (INDEPENDENT_AMBULATORY_CARE_PROVIDER_SITE_OTHER): Payer: Medicare HMO | Admitting: Family Medicine

## 2021-04-20 ENCOUNTER — Encounter: Payer: Self-pay | Admitting: Family Medicine

## 2021-04-20 ENCOUNTER — Other Ambulatory Visit: Payer: Self-pay

## 2021-04-20 VITALS — BP 125/61 | HR 70 | Wt 172.0 lb

## 2021-04-20 DIAGNOSIS — N401 Enlarged prostate with lower urinary tract symptoms: Secondary | ICD-10-CM

## 2021-04-20 DIAGNOSIS — F32A Depression, unspecified: Secondary | ICD-10-CM | POA: Diagnosis not present

## 2021-04-20 DIAGNOSIS — E782 Mixed hyperlipidemia: Secondary | ICD-10-CM

## 2021-04-20 DIAGNOSIS — R413 Other amnesia: Secondary | ICD-10-CM | POA: Diagnosis not present

## 2021-04-20 DIAGNOSIS — N3281 Overactive bladder: Secondary | ICD-10-CM

## 2021-04-20 DIAGNOSIS — F419 Anxiety disorder, unspecified: Secondary | ICD-10-CM

## 2021-04-20 MED ORDER — PAROXETINE HCL 20 MG PO TABS: 20.0000 mg | ORAL_TABLET | Freq: Every day | ORAL | 3 refills | Status: DC

## 2021-04-20 MED ORDER — PRAVASTATIN SODIUM 40 MG PO TABS: 40.0000 mg | ORAL_TABLET | Freq: Every day | ORAL | 3 refills | Status: DC

## 2021-04-20 NOTE — Progress Notes (Signed)
BP 125/61   Pulse 70   Wt 172 lb (78 kg)   SpO2 97%   BMI 25.40 kg/m    Subjective:   Patient ID: CHIMAOBI CASEBOLT, male    DOB: [birth year], 80 y.o.   MRN: [id number]  HPI: JAEVIN MEDEARIS is a 80 y.o. male presenting on 04/20/2021 for Medical Management of Chronic Issues and Hyperlipidemia   HPI Patient sees Dr. Jeffie Pollock for his prostate and BPH and overactive bladder.  Currently takes Flomax and Ditropan  Hyperlipidemia Patient is coming in for recheck of his hyperlipidemia. The patient is currently taking pravastatin. They deny any issues with myalgias or history of liver damage from it. They deny any focal numbness or weakness or chest pain.   Anxiety and memory Daughter is here with him today and says that the Paxil 20 seems to be helping him and she even thinks that the memory has gotten somewhat better with that.  Relevant past medical, surgical, family and social history reviewed and updated as indicated. Interim medical history since our last visit reviewed. Allergies and medications reviewed and updated.  Review of Systems  Constitutional:  Negative for chills and fever.  Eyes:  Negative for visual disturbance.  Respiratory:  Negative for shortness of breath and wheezing.   Cardiovascular:  Negative for chest pain and leg swelling.  Musculoskeletal:  Negative for back pain and gait problem.  Skin:  Negative for rash.  Neurological:  Negative for dizziness, weakness and numbness.  All other systems reviewed and are negative.  Per HPI unless specifically indicated above   Allergies as of 04/20/2021       Reactions   Celebrex [celecoxib] Other (See Comments)   Leg swelling, broke out in rash        Medication List        Accurate as of April 20, 2021  4:28 PM. If you have any questions, ask your nurse or doctor.          oxybutynin 5 MG tablet Commonly known as: DITROPAN TAKE ONE (1) TABLET EACH DAY   PARoxetine 20 MG tablet Commonly known as:  Paxil Take 1 tablet (20 mg total) by mouth daily.   pravastatin 40 MG tablet Commonly known as: PRAVACHOL Take 1 tablet (40 mg total) by mouth daily. What changed:  how much to take how to take this when to take this additional instructions Changed by: Fransisca Kaufmann Thaddeaus Monica, MD   tamsulosin 0.4 MG Caps capsule Commonly known as: FLOMAX Take 1 capsule (0.4 mg total) by mouth daily after supper.         Objective:   BP 125/61   Pulse 70   Wt 172 lb (78 kg)   SpO2 97%   BMI 25.40 kg/m   Wt Readings from Last 3 Encounters:  04/20/21 172 lb (78 kg)  02/23/21 162 lb 12.8 oz (73.8 kg)  08/24/20 164 lb (74.4 kg)    Physical Exam Vitals and nursing note reviewed.  Constitutional:      General: He is not in acute distress.    Appearance: He is well-developed. He is not diaphoretic.  Eyes:     General: No scleral icterus.    Conjunctiva/sclera: Conjunctivae normal.  Neck:     Thyroid: No thyromegaly.  Cardiovascular:     Rate and Rhythm: Normal rate and regular rhythm.     Heart sounds: Normal heart sounds. No murmur heard. Pulmonary:     Effort: Pulmonary effort is normal. No respiratory  distress.     Breath sounds: Normal breath sounds. No wheezing.  Musculoskeletal:        General: No swelling. Normal range of motion.     Cervical back: Neck supple.  Lymphadenopathy:     Cervical: No cervical adenopathy.  Skin:    General: Skin is warm and dry.     Findings: No rash.  Neurological:     Mental Status: He is alert and oriented to person, place, and time.     Coordination: Coordination normal.  Psychiatric:        Behavior: Behavior normal.        Cognition and Memory: He exhibits impaired recent memory.      Assessment & Plan:   Problem List Items Addressed This Visit       Genitourinary   BPH (benign prostatic hyperplasia)   Relevant Orders   PSA, total and free   OAB (overactive bladder)     Other   Anxiety and depression   Relevant Medications    PARoxetine (PAXIL) 20 MG tablet   Other Relevant Orders   CBC with Differential/Platelet   CMP14+EGFR   Mixed hyperlipidemia - Primary   Relevant Medications   pravastatin (PRAVACHOL) 40 MG tablet   Other Relevant Orders   CBC with Differential/Platelet   CMP14+EGFR   Lipid panel   Other Visit Diagnoses     Memory difficulties       Relevant Medications   PARoxetine (PAXIL) 20 MG tablet       Continue current medicine, seems to be doing well, will check blood work. Follow up plan: Return in about 6 months (around 10/18/2021), or if symptoms worsen or fail to improve, for Hyperlipidemia and anxiety and memory.  Counseling provided for all of the vaccine components Orders Placed This Encounter  Procedures   CBC with Differential/Platelet   CMP14+EGFR   Lipid panel   PSA, total and free    Caryl Pina, MD [facility] 04/20/2021, 4:28 PM

## 2021-04-21 LAB — CMP14+EGFR
ALT: 21 IU/L (ref 0–44)
AST: 25 IU/L (ref 0–40)
Albumin/Globulin Ratio: 1.9 (ref 1.2–2.2)
Albumin: 4 g/dL (ref 3.6–4.6)
Alkaline Phosphatase: 90 IU/L (ref 44–121)
BUN/Creatinine Ratio: 14 (ref 10–24)
BUN: 19 mg/dL (ref 8–27)
Bilirubin Total: 0.4 mg/dL (ref 0.0–1.2)
CO2: 24 mmol/L (ref 20–29)
Calcium: 9.2 mg/dL (ref 8.6–10.2)
Chloride: 103 mmol/L (ref 96–106)
Creatinine, Ser: 1.32 mg/dL — ABNORMAL HIGH (ref 0.76–1.27)
Globulin, Total: 2.1 g/dL (ref 1.5–4.5)
Glucose: 118 mg/dL — ABNORMAL HIGH (ref 70–99)
Potassium: 4.4 mmol/L (ref 3.5–5.2)
Sodium: 141 mmol/L (ref 134–144)
Total Protein: 6.1 g/dL (ref 6.0–8.5)
eGFR: 54 mL/min/{1.73_m2} — ABNORMAL LOW (ref >59)

## 2021-04-21 LAB — CBC WITH DIFFERENTIAL/PLATELET
Basophils Absolute: 0 10*3/uL (ref 0.0–0.2)
Basos: 1 %
EOS (ABSOLUTE): 0.1 10*3/uL (ref 0.0–0.4)
Eos: 2 %
Hematocrit: 36.6 % — ABNORMAL LOW (ref 37.5–51.0)
Hemoglobin: 12.2 g/dL — ABNORMAL LOW (ref 13.0–17.7)
Immature Grans (Abs): 0 10*3/uL (ref 0.0–0.1)
Immature Granulocytes: 0 %
Lymphocytes Absolute: 1.2 10*3/uL (ref 0.7–3.1)
Lymphs: 24 %
MCH: 31.9 pg (ref 26.6–33.0)
MCHC: 33.3 g/dL (ref 31.5–35.7)
MCV: 96 fL (ref 79–97)
Monocytes Absolute: 0.6 10*3/uL (ref 0.1–0.9)
Monocytes: 12 %
Neutrophils Absolute: 3 10*3/uL (ref 1.4–7.0)
Neutrophils: 61 %
Platelets: 203 10*3/uL (ref 150–450)
RBC: 3.82 x10E6/uL — ABNORMAL LOW (ref 4.14–5.80)
RDW: 12 % (ref 11.6–15.4)
WBC: 4.9 10*3/uL (ref 3.4–10.8)

## 2021-04-21 LAB — LIPID PANEL
Chol/HDL Ratio: 3 ratio (ref 0.0–5.0)
Cholesterol, Total: 148 mg/dL (ref 100–199)
HDL: 49 mg/dL (ref >39)
LDL Chol Calc (NIH): 87 mg/dL (ref 0–99)
Triglycerides: 60 mg/dL (ref 0–149)
VLDL Cholesterol Cal: 12 mg/dL (ref 5–40)

## 2021-04-21 LAB — PSA, TOTAL AND FREE
PSA, Free Pct: 45 %
PSA, Free: 0.54 ng/mL
Prostate Specific Ag, Serum: 1.2 ng/mL (ref 0.0–4.0)

## 2021-05-03 ENCOUNTER — Telehealth: Payer: Self-pay | Admitting: Family Medicine

## 2021-05-23 ENCOUNTER — Telehealth: Payer: Self-pay | Admitting: Family Medicine

## 2021-06-25 ENCOUNTER — Telehealth: Payer: Self-pay | Admitting: Family Medicine

## 2021-06-25 NOTE — Telephone Encounter (Signed)
Called and left message for pt to call back and set up AWV

## 2021-06-26 ENCOUNTER — Telehealth: Payer: Self-pay | Admitting: Family Medicine

## 2021-06-26 NOTE — Telephone Encounter (Signed)
°  Left message for patient to call back and schedule Medicare Annual Wellness Visit (AWV) to be completed by video or phone. ° °No hx of AWV eligible for AWVI as of 06/03/2009 per [employer]  ° °Please schedule at anytime with [facility] Nurse Health Advisor --- Mary Jane ° °45 Minutes appointment  ° °Any questions, please call me at [phone]   °

## 2021-06-29 ENCOUNTER — Encounter: Payer: Self-pay | Admitting: Nurse Practitioner

## 2021-06-29 ENCOUNTER — Ambulatory Visit (INDEPENDENT_AMBULATORY_CARE_PROVIDER_SITE_OTHER): Payer: Medicare HMO | Admitting: Nurse Practitioner

## 2021-06-29 ENCOUNTER — Ambulatory Visit (INDEPENDENT_AMBULATORY_CARE_PROVIDER_SITE_OTHER): Payer: Medicare HMO

## 2021-06-29 VITALS — BP 131/64 | HR 56 | Temp 97.8°F | Ht 69.0 in | Wt 177.1 lb

## 2021-06-29 DIAGNOSIS — W19XXXA Unspecified fall, initial encounter: Secondary | ICD-10-CM | POA: Diagnosis not present

## 2021-06-29 DIAGNOSIS — R0781 Pleurodynia: Secondary | ICD-10-CM | POA: Diagnosis not present

## 2021-06-29 MED ORDER — ACETAMINOPHEN 500 MG PO TABS: 500.0000 mg | ORAL_TABLET | Freq: Four times a day (QID) | ORAL | 0 refills | Status: DC | PRN

## 2021-06-29 NOTE — Progress Notes (Signed)
Acute Office Visit  Subjective:    Patient ID: Terry Sloan, male    DOB: 1940-06-25, 81 y.o.   MRN: [id number]  Chief Complaint  Patient presents with   Fall    Fall The accident occurred 5 to 7 days ago. The fall occurred in unknown circumstances. He fell from an unknown height. There was no blood loss. Pain location: Left chest wall/rib. The pain is at a severity of 9/10. The pain is severe. Pertinent negatives include no fever, headaches, nausea, numbness or vomiting. He has tried acetaminophen for the symptoms. The treatment provided mild relief.  Patient is in today for  Pain  He reports new onset right rib pain. was an injury that may have caused the pain. The pain started a few days ago and is staying constant. The pain does not radiate . The pain is described as aching and sharp, is 8/10 in intensity, occurring constantly. Symptoms are worse in the: all day  Aggravating factors: bending backwards, bending forwards, bending sideways, and walking Relieving factors: none.  He has tried application of ice and acetaminophen with no relief.       Past Medical History:  Diagnosis Date   Arthritis    GERD (gastroesophageal reflux disease)    [name]   History of kidney stones    Hyperlipidemia    Macular degeneration    PVD (peripheral vascular disease) (HCC)    Stroke (HCC)    TIA      Past Surgical History:  Procedure Laterality Date   Bone turmor Left    foreheah   EYE SURGERY Bilateral    cataract removal   FRACTURE SURGERY Left    leg   pins   KNEE ARTHROPLASTY     KNEE SURGERY Left    NECK SURGERY     Tumor reomoved from head/neck in the 60s   REPLACEMENT TOTAL KNEE Left 07/14/2018   ROTATOR CUFF REPAIR Right    times 2   TOTAL KNEE ARTHROPLASTY WITH HARDWARE REMOVAL Left 02/14/2017   Procedure: Removal Left Tibia Nail, Cemented Total Knee Arthroplasty;  Surgeon: Marybelle Killings, MD;  Location: [facility];  Service: Orthopedics;  Laterality: Left;    Family  History  Problem Relation Age of Onset   Stroke Neg Hx     Social History   Socioeconomic History   Marital status: Single    Spouse name: Not on file   Number of children: 9   Years of education: Not on file   Highest education level: Not on file  Occupational History   Occupation: Retired  Tobacco Use   Smoking status: Former    Packs/day: 1.50    Years: 20.00    Pack years: 30.00    Types: Cigarettes   Smokeless tobacco: Never   Tobacco comments:    Quit 20+ year ago  Vaping Use   Vaping Use: Never used  Substance and Sexual Activity   Alcohol use: Yes    Comment: Occasional beer   Drug use: No   Sexual activity: Not on file  Other Topics Concern   Not on file  Social History Narrative   Lives at home w/ his significant other   Right-handed   Caffeine: some coffee, 3 diet [employer] per day   Social Determinants of Health   Financial Resource Strain: Not on file  Food Insecurity: Not on file  Transportation Needs: Not on file  Physical Activity: Not on file  Stress: Not on file  Social Connections: Not on file  Intimate Partner Violence: Not on file    Outpatient Medications Prior to Visit  Medication Sig Dispense Refill   oxybutynin (DITROPAN) 5 MG tablet TAKE ONE (1) TABLET EACH DAY 90 tablet 3   PARoxetine (PAXIL) 20 MG tablet Take 1 tablet (20 mg total) by mouth daily. 90 tablet 3   pravastatin (PRAVACHOL) 40 MG tablet Take 1 tablet (40 mg total) by mouth daily. 90 tablet 3   tamsulosin (FLOMAX) 0.4 MG CAPS capsule Take 1 capsule (0.4 mg total) by mouth daily after supper. 90 capsule 3   No facility-administered medications prior to visit.    Allergies  Allergen Reactions   Celebrex [Celecoxib] Other (See Comments)    Leg swelling, broke out in rash    Review of Systems  Constitutional: Negative.  Negative for fever.  HENT: Negative.    Eyes: Negative.   Respiratory: Negative.    Cardiovascular: Negative.   Gastrointestinal: Negative.   Negative for nausea and vomiting.  Skin:  Negative for rash.  Neurological:  Negative for numbness and headaches.  All other systems reviewed and are negative.     Objective:    Physical Exam Vitals and nursing note reviewed.  Constitutional:      Appearance: Normal appearance.  HENT:     Head: Normocephalic.     Right Ear: External ear normal.     Left Ear: External ear normal.     Nose: Nose normal.     Mouth/Throat:     Mouth: Mucous membranes are moist.     Pharynx: Oropharynx is clear.  Eyes:     Conjunctiva/sclera: Conjunctivae normal.  Cardiovascular:     Rate and Rhythm: Normal rate and regular rhythm.     Pulses: Normal pulses.     Heart sounds: Normal heart sounds.  Abdominal:     General: Bowel sounds are normal.  Musculoskeletal:     Comments: Left rib cage pain  Skin:    General: Skin is warm.     Findings: No rash.  Neurological:     Mental Status: He is alert and oriented to person, place, and time.  Psychiatric:        Mood and Affect: Mood normal.        Behavior: Behavior normal.    BP 131/64    Pulse (!) 56    Temp 97.8 F (36.6 C) (Temporal)    Ht 5' 9"  (1.753 m)    Wt 177 lb 2 oz (80.3 kg)    BMI 26.16 kg/m  Wt Readings from Last 3 Encounters:  06/29/21 177 lb 2 oz (80.3 kg)  04/20/21 172 lb (78 kg)  02/23/21 162 lb 12.8 oz (73.8 kg)    Health Maintenance Due  Topic Date Due   Zoster Vaccines- Shingrix (1 of 2) Never done    There are no preventive care reminders to display for this patient.   Lab Results  Component Value Date   TSH 1.490 09/23/2016   Lab Results  Component Value Date   WBC 4.9 04/20/2021   HGB 12.2 (L) 04/20/2021   HCT 36.6 (L) 04/20/2021   MCV 96 04/20/2021   PLT 203 04/20/2021   Lab Results  Component Value Date   NA 141 04/20/2021   K 4.4 04/20/2021   CO2 24 04/20/2021   GLUCOSE 118 (H) 04/20/2021   BUN 19 04/20/2021   CREATININE 1.32 (H) 04/20/2021   BILITOT 0.4 04/20/2021   ALKPHOS 90 04/20/2021  AST 25 04/20/2021   ALT 21 04/20/2021   PROT 6.1 04/20/2021   ALBUMIN 4.0 04/20/2021   CALCIUM 9.2 04/20/2021   ANIONGAP 5 02/15/2017   EGFR 54 (L) 04/20/2021   Lab Results  Component Value Date   CHOL 148 04/20/2021   Lab Results  Component Value Date   HDL 49 04/20/2021   Lab Results  Component Value Date   LDLCALC 87 04/20/2021   Lab Results  Component Value Date   TRIG 60 04/20/2021   Lab Results  Component Value Date   CHOLHDL 3.0 04/20/2021   No results found for: HGBA1C     Assessment & Plan:  Tylenol for pain Ice compress Chest brace Incentive spirometer Chest x-ray completed to rule out rib fracture Education provided for fall prevention Follow up with worsening unresolved symptoms   Problem List Items Addressed This Visit       Other   Fall   Relevant Orders   DG Chest 2 View   Other Visit Diagnoses     Rib pain    -  Primary   Relevant Medications   acetaminophen (TYLENOL) 500 MG tablet   Other Relevant Orders   DG Chest 2 View        Meds ordered this encounter  Medications   acetaminophen (TYLENOL) 500 MG tablet    Sig: Take 1 tablet (500 mg total) by mouth every 6 (six) hours as needed.    Dispense:  30 tablet    Refill:  0    Order Specific Question:   Supervising Provider    Answer:   Claretta Fraise [[id number]]     Ivy Lynn, NP

## 2021-06-29 NOTE — Patient Instructions (Addendum)
Fall Prevention in the Home, Adult Falls can cause injuries and affect people of all ages. There are many simple things that you can do to make your home safe and to help prevent falls. Ask for help when making these changes, if needed. What actions can I take to prevent falls? General instructions Use good lighting in all rooms. Replace any light bulbs that burn out, turn on lights if it is dark, and use night-lights. Place frequently used items in easy-to-reach places. Lower the shelves around your home if necessary. Set up furniture so that there are clear paths around it. Avoid moving your furniture around. Remove throw rugs and other tripping hazards from the floor. Avoid walking on wet floors. Fix any uneven floor surfaces. Add color or contrast paint or tape to grab bars and handrails in your home. Place contrasting color strips on the first and last steps of staircases. When you use a stepladder, make sure that it is completely opened and that the sides and supports are firmly locked. Have someone hold the ladder while you are using it. Do not climb a closed stepladder. Know where your pets are when moving through your home. What can I do in the bathroom?   Keep the floor dry. Immediately clean up any water that is on the floor. Remove soap buildup in the tub or shower regularly. Use nonskid mats or decals on the floor of the tub or shower. Attach bath mats securely with double-sided, nonslip rug tape. If you need to sit down while you are in the shower, use a plastic, nonslip stool. Install grab bars by the toilet and in the tub and shower. Do not use towel bars as grab bars. What can I do in the bedroom? Make sure that a bedside light is easy to reach. Do not use oversized bedding that reaches the floor. Have a firm chair that has side arms to use for getting dressed. What can I do in the kitchen? Clean up any spills right away. If you need to reach for something above you, use a  sturdy step stool that has a grab bar. Keep electrical cables out of the way. Do not use floor polish or wax that makes floors slippery. If you must use wax, make sure that it is non-skid floor wax. What can I do with my stairs? Do not leave any items on the stairs. Make sure that you have a light switch at the top and the bottom of the stairs. Have them installed if you do not have them. Make sure that there are handrails on both sides of the stairs. Fix handrails that are broken or loose. Make sure that handrails are as long as the staircases. Install non-slip stair treads on all stairs in your home. Avoid having throw rugs at the top or bottom of stairs, or secure the rugs with carpet tape to prevent them from moving. Choose a carpet design that does not hide the edge of steps on the stairs. Check any carpeting to make sure that it is firmly attached to the stairs. Fix any carpet that is loose or worn. What can I do on the outside of my home? Use bright outdoor lighting. Regularly repair the edges of walkways and driveways and fix any cracks. Remove high doorway thresholds. Trim any shrubbery on the main path into your home. Regularly check that handrails are securely fastened and in good repair. Both sides of all steps should have handrails. Install guardrails along the edges  of any raised decks or porches. Clear walkways of debris and clutter, including tools and rocks. Have leaves, snow, and ice cleared regularly. Use sand or salt on walkways during winter months. In the garage, clean up any spills right away, including grease or oil spills. What other actions can I take? Wear closed-toe shoes that fit well and support your feet. Wear shoes that have rubber soles or low heels. Use mobility aids as needed, such as canes, walkers, scooters, and crutches. Review your medicines with your health care provider. Some medicines can cause dizziness or changes in blood pressure, which increase  your risk of falling. Talk with your health care provider about other ways that you can decrease your risk of falls. This may include working with a physical therapist or trainer to improve your strength, balance, and endurance. Where to find more information Centers for Disease Control and Prevention, [employer]: FootballExhibition.com.br [employer] on Aging: https://walker.com/ Contact a health care provider if: You are afraid of falling at home. You feel weak, drowsy, or dizzy at home. You fall at home. Summary There are many simple things that you can do to make your home safe and to help prevent falls. Ways to make your home safe include removing tripping hazards and installing grab bars in the bathroom. Ask for help when making these changes in your home. This information is not intended to replace advice given to you by your health care provider. Make sure you discuss any questions you have with your health care provider. Document Revised: 12/22/2019 Document Reviewed: 12/22/2019 Elsevier Patient Education  2022 Elsevier Inc. Chest Wall Pain Chest wall pain is pain in or around the bones and muscles of your chest. Chest wall pain may be caused by: An injury. Coughing a lot. Using your chest and arm muscles too much. Sometimes, the cause may not be known. This pain may take a few weeks or longer to get better. Follow these instructions at home: Managing pain, stiffness, and swelling If told, put ice on the painful area: Put ice in a plastic bag. Place a towel between your skin and the bag. Leave the ice on for 20 minutes, 2-3 times a day.  Activity Rest as told by your doctor. Avoid doing things that cause pain. This includes lifting heavy items. Ask your doctor what activities are safe for you. General instructions  Take over-the-counter and prescription medicines only as told by your doctor. Do not use any products that contain nicotine or tobacco, such as cigarettes, e-cigarettes, and  chewing tobacco. If you need help quitting, ask your doctor. Keep all follow-up visits as told by your doctor. This is important. Contact a doctor if: You have a fever. Your chest pain gets worse. You have new symptoms. Get help right away if: You feel sick to your stomach (nauseous) or you throw up (vomit). You feel sweaty or light-headed. You have a cough with mucus from your lungs (sputum) or you cough up blood. You are short of breath. These symptoms may be an emergency. Do not wait to see if the symptoms will go away. Get medical help right away. Call your local emergency services (911 in the [place] Do not drive yourself to the hospital. Summary Chest wall pain is pain in or around the bones and muscles of your chest. It may be treated with ice, rest, and medicines. Your condition may also get better if you avoid doing things that cause pain. Contact a doctor if you have a fever, chest  pain that gets worse, or new symptoms. Get help right away if you feel light-headed or you get short of breath. These symptoms may be an emergency. This information is not intended to replace advice given to you by your health care provider. Make sure you discuss any questions you have with your health care provider. Document Revised: 08/22/2020 Document Reviewed: 08/04/2020 Elsevier Patient Education  2022 [employer].

## 2021-07-20 ENCOUNTER — Ambulatory Visit (INDEPENDENT_AMBULATORY_CARE_PROVIDER_SITE_OTHER): Payer: Medicare HMO

## 2021-07-20 VITALS — Wt 177.0 lb

## 2021-07-20 DIAGNOSIS — Z0000 Encounter for general adult medical examination without abnormal findings: Secondary | ICD-10-CM | POA: Diagnosis not present

## 2021-07-20 NOTE — Patient Instructions (Signed)
Terry Sloan , Thank you for taking time to come for your Medicare Wellness Visit. I appreciate your ongoing commitment to your health goals. Please review the following plan we discussed and let me know if I can assist you in the future.   Screening recommendations/referrals: Colonoscopy: No longer required Recommended yearly ophthalmology/optometry visit for glaucoma screening and checkup Recommended yearly dental visit for hygiene and checkup  Vaccinations: Influenza vaccine: Declined Pneumococcal vaccine: Done 07/15/2018 & 07/30/2019 Tdap vaccine: Done 01/25/2019 -Repeat in 10 years Shingles vaccine: Due - get at next visit - 2 doses 2-6 months apart   Covid-19: Declined  Advanced directives: Please bring a copy of your health care power of attorney and living will to the office to be added to your chart at your convenience.   Conditions/risks identified: Aim for 30 minutes of exercise or brisk walking each day, drink 6-8 glasses of water and eat lots of fruits and vegetables.   Next appointment: Follow up in one year for your annual wellness visit.   Preventive Care 25 Years and Older, Male  Preventive care refers to lifestyle choices and visits with your health care provider that can promote health and wellness. What does preventive care include? A yearly physical exam. This is also called an annual well check. Dental exams once or twice a year. Routine eye exams. Ask your health care provider how often you should have your eyes checked. Personal lifestyle choices, including: Daily care of your teeth and gums. Regular physical activity. Eating a healthy diet. Avoiding tobacco and drug use. Limiting alcohol use. Practicing safe sex. Taking low doses of aspirin every day. Taking vitamin and mineral supplements as recommended by your health care provider. What happens during an annual well check? The services and screenings done by your health care provider during your annual well  check will depend on your age, overall health, lifestyle risk factors, and family history of disease. Counseling  Your health care provider may ask you questions about your: Alcohol use. Tobacco use. Drug use. Emotional well-being. Home and relationship well-being. Sexual activity. Eating habits. History of falls. Memory and ability to understand (cognition). Work and work Statistician. Screening  You may have the following tests or measurements: Height, weight, and BMI. Blood pressure. Lipid and cholesterol levels. These may be checked every 5 years, or more frequently if you are over 18 years old. Skin check. Lung cancer screening. You may have this screening every year starting at age 32 if you have a 30-pack-year history of smoking and currently smoke or have quit within the past 15 years. Fecal occult blood test (FOBT) of the stool. You may have this test every year starting at age 67. Flexible sigmoidoscopy or colonoscopy. You may have a sigmoidoscopy every 5 years or a colonoscopy every 10 years starting at age 28. Prostate cancer screening. Recommendations will vary depending on your family history and other risks. Hepatitis C blood test. Hepatitis B blood test. Sexually transmitted disease (STD) testing. Diabetes screening. This is done by checking your blood sugar (glucose) after you have not eaten for a while (fasting). You may have this done every 1-3 years. Abdominal aortic aneurysm (AAA) screening. You may need this if you are a current or former smoker. Osteoporosis. You may be screened starting at age 58 if you are at high risk. Talk with your health care provider about your test results, treatment options, and if necessary, the need for more tests. Vaccines  Your health care provider may recommend certain  vaccines, such as: Influenza vaccine. This is recommended every year. Tetanus, diphtheria, and acellular pertussis (Tdap, Td) vaccine. You may need a Td booster  every 10 years. Zoster vaccine. You may need this after age 69. Pneumococcal 13-valent conjugate (PCV13) vaccine. One dose is recommended after age 63. Pneumococcal polysaccharide (PPSV23) vaccine. One dose is recommended after age 16. Talk to your health care provider about which screenings and vaccines you need and how often you need them. This information is not intended to replace advice given to you by your health care provider. Make sure you discuss any questions you have with your health care provider. Document Released: 06/16/2015 Document Revised: 02/07/2016 Document Reviewed: 03/21/2015 Elsevier Interactive Patient Education  2017 [employer] Prevention in the Home Falls can cause injuries. They can happen to people of all ages. There are many things you can do to make your home safe and to help prevent falls. What can I do on the outside of my home? Regularly fix the edges of walkways and driveways and fix any cracks. Remove anything that might make you trip as you walk through a door, such as a raised step or threshold. Trim any bushes or trees on the path to your home. Use bright outdoor lighting. Clear any walking paths of anything that might make someone trip, such as rocks or tools. Regularly check to see if handrails are loose or broken. Make sure that both sides of any steps have handrails. Any raised decks and porches should have guardrails on the edges. Have any leaves, snow, or ice cleared regularly. Use sand or salt on walking paths during winter. Clean up any spills in your garage right away. This includes oil or grease spills. What can I do in the bathroom? Use night lights. Install grab bars by the toilet and in the tub and shower. Do not use towel bars as grab bars. Use non-skid mats or decals in the tub or shower. If you need to sit down in the shower, use a plastic, non-slip stool. Keep the floor dry. Clean up any water that spills on the floor as soon  as it happens. Remove soap buildup in the tub or shower regularly. Attach bath mats securely with double-sided non-slip rug tape. Do not have throw rugs and other things on the floor that can make you trip. What can I do in the bedroom? Use night lights. Make sure that you have a light by your bed that is easy to reach. Do not use any sheets or blankets that are too big for your bed. They should not hang down onto the floor. Have a firm chair that has side arms. You can use this for support while you get dressed. Do not have throw rugs and other things on the floor that can make you trip. What can I do in the kitchen? Clean up any spills right away. Avoid walking on wet floors. Keep items that you use a lot in easy-to-reach places. If you need to reach something above you, use a strong step stool that has a grab bar. Keep electrical cords out of the way. Do not use floor polish or wax that makes floors slippery. If you must use wax, use non-skid floor wax. Do not have throw rugs and other things on the floor that can make you trip. What can I do with my stairs? Do not leave any items on the stairs. Make sure that there are handrails on both sides of the stairs  and use them. Fix handrails that are broken or loose. Make sure that handrails are as long as the stairways. Check any carpeting to make sure that it is firmly attached to the stairs. Fix any carpet that is loose or worn. Avoid having throw rugs at the top or bottom of the stairs. If you do have throw rugs, attach them to the floor with carpet tape. Make sure that you have a light switch at the top of the stairs and the bottom of the stairs. If you do not have them, ask someone to add them for you. What else can I do to help prevent falls? Wear shoes that: Do not have high heels. Have rubber bottoms. Are comfortable and fit you well. Are closed at the toe. Do not wear sandals. If you use a stepladder: Make sure that it is fully  opened. Do not climb a closed stepladder. Make sure that both sides of the stepladder are locked into place. Ask someone to hold it for you, if possible. Clearly mark and make sure that you can see: Any grab bars or handrails. First and last steps. Where the edge of each step is. Use tools that help you move around (mobility aids) if they are needed. These include: Canes. Walkers. Scooters. Crutches. Turn on the lights when you go into a dark area. Replace any light bulbs as soon as they burn out. Set up your furniture so you have a clear path. Avoid moving your furniture around. If any of your floors are uneven, fix them. If there are any pets around you, be aware of where they are. Review your medicines with your doctor. Some medicines can make you feel dizzy. This can increase your chance of falling. Ask your doctor what other things that you can do to help prevent falls. This information is not intended to replace advice given to you by your health care provider. Make sure you discuss any questions you have with your health care provider. Document Released: 03/16/2009 Document Revised: 10/26/2015 Document Reviewed: 06/24/2014 Elsevier Interactive Patient Education  2017 [employer].

## 2021-07-20 NOTE — Progress Notes (Signed)
Subjective:   Terry Sloan is a 81 y.o. male who presents for Medicare Annual/Subsequent preventive examination.  Virtual Visit via Telephone Note  I connected with  Terry Sloan on 07/20/21 at  3:30 PM EST by telephone and verified that I am speaking with the correct person using two identifiers.  Location: Patient: Home Provider: [facility] Persons participating in the virtual visit: patient and daughter Terry Sloan/Nurse Health Advisor   I discussed the limitations, risks, security and privacy concerns of performing an evaluation and management service by telephone and the availability of in person appointments. The patient expressed understanding and agreed to proceed.  Interactive audio and video telecommunications were attempted between this nurse and patient, however failed, due to patient having technical difficulties OR patient did not have access to video capability.  We continued and completed visit with audio only.  Some vital signs may be absent or patient reported.   Xiong Haidar E Steele Ledonne, LPN   Review of Systems     Cardiac Risk Factors include: advanced age (>64men, >44 women);sedentary lifestyle;male gender;dyslipidemia;Other (see comment), Risk factor comments: hx of TIA, PVD     Objective:    Today's Vitals   07/20/21 1525  Weight: 177 lb (80.3 kg)   Body mass index is 26.14 kg/m.  Advanced Directives 07/20/2021 02/07/2017  Does Patient Have a Medical Advance Directive? Yes No  Type of Estate agent of [place];Living will -  Copy of Healthcare Power of Attorney in Chart? No - copy requested -  Would patient like information on creating a medical advance directive? - Yes (MAU/Ambulatory/Procedural Areas - Information given)    Current Medications (verified) Outpatient Encounter Medications as of 07/20/2021  Medication Sig   acetaminophen (TYLENOL) 500 MG tablet Take 1 tablet (500 mg total) by mouth every 6 (six) hours as needed.   oxybutynin (DITROPAN)  5 MG tablet TAKE ONE (1) TABLET EACH DAY   PARoxetine (PAXIL) 20 MG tablet Take 1 tablet (20 mg total) by mouth daily.   pravastatin (PRAVACHOL) 40 MG tablet Take 1 tablet (40 mg total) by mouth daily.   tamsulosin (FLOMAX) 0.4 MG CAPS capsule Take 1 capsule (0.4 mg total) by mouth daily after supper.   No facility-administered encounter medications on file as of 07/20/2021.    Allergies (verified) Celebrex [celecoxib]   History: Past Medical History:  Diagnosis Date   Arthritis    GERD (gastroesophageal reflux disease)    [name]   History of kidney stones    Hyperlipidemia    Macular degeneration    PVD (peripheral vascular disease) (HCC)    Stroke (HCC)    TIA     Past Surgical History:  Procedure Laterality Date   Bone turmor Left    foreheah   EYE SURGERY Bilateral    cataract removal   FRACTURE SURGERY Left    leg   pins   KNEE ARTHROPLASTY     KNEE SURGERY Left    NECK SURGERY     Tumor reomoved from head/neck in the 60s   REPLACEMENT TOTAL KNEE Left 07/14/2018   ROTATOR CUFF REPAIR Right    times 2   TOTAL KNEE ARTHROPLASTY WITH HARDWARE REMOVAL Left 02/14/2017   Procedure: Removal Left Tibia Nail, Cemented Total Knee Arthroplasty;  Surgeon: Eldred Manges, MD;  Location: [facility];  Service: Orthopedics;  Laterality: Left;   Family History  Problem Relation Age of Onset   Stroke Neg Hx    Social History   Socioeconomic History  Marital status: Media plannerDomestic Partner    Spouse name: Not on file   Number of children: 9   Years of education: Not on file   Highest education level: Not on file  Occupational History   Occupation: Retired  Tobacco Use   Smoking status: Former    Packs/day: 1.50    Years: 20.00    Pack years: 30.00    Types: Cigarettes   Smokeless tobacco: Never   Tobacco comments:    Quit 20+ year ago  Vaping Use   Vaping Use: Never used  Substance and Sexual Activity   Alcohol use: Yes    Comment: Occasional beer   Drug use: No   Sexual  activity: Not on file  Other Topics Concern   Not on file  Social History Narrative   Lives at home w/ his significant other   Right-handed   Caffeine: some coffee, 3 diet Mt Dew's per day   Social Determinants of Health   Financial Resource Strain: Low Risk    Difficulty of Paying Living Expenses: Not hard at all  Food Insecurity: No Food Insecurity   Worried About Programme researcher, broadcasting/film/videounning Out of Food in the Last Year: Never true   Baristaan Out of Food in the Last Year: Never true  Transportation Needs: No Transportation Needs   Lack of Transportation (Medical): No   Lack of Transportation (Non-Medical): No  Physical Activity: Insufficiently Active   Days of Exercise per Week: 7 days   Minutes of Exercise per Session: 10 min  Stress: No Stress Concern Present   Feeling of Stress : Only a little  Social Connections: Unknown   Frequency of Communication with Friends and Family: More than three times a week   Frequency of Social Gatherings with Friends and Family: More than three times a week   Attends Religious Services: Not on file   Active Member of Clubs or Organizations: No   Attends BankerClub or Organization Meetings: Never   Marital Status: Widowed    Tobacco Counseling Counseling given: Not Answered Tobacco comments: Quit 20+ year ago   Clinical Intake:  Pre-visit preparation completed: Yes  Pain : No/denies pain     BMI - recorded: 26.14 Nutritional Status: BMI 25 -29 Overweight Nutritional Risks: None Diabetes: No  How often do you need to have someone help you when you read instructions, pamphlets, or other written materials from your doctor or pharmacy?: 1 - Never  Diabetic? no  Interpreter Needed?: No  Information entered by :: Rexton Greulich, LPN   Activities of Daily Living In your present state of health, do you have any difficulty performing the following activities: 07/20/2021  Hearing? Y  Comment mild  Vision? N  Difficulty concentrating or making decisions? Y   Walking or climbing stairs? Y  Dressing or bathing? Y  Doing errands, shopping? Y  Preparing Food and eating ? Y  Using the Toilet? N  In the past six months, have you accidently leaked urine? Y  Comment mild- improved since starting med - seeing Urology  Do you have problems with loss of bowel control? N  Managing your Medications? Y  Managing your Finances? Y  Housekeeping or managing your Housekeeping? Y  Some recent data might be hidden    Patient Care Team: Dettinger, Elige RadonJoshua A, MD as PCP - General (Family Medicine)  Indicate any recent Medical Services you may have received from other than [facility] providers in the past year (date may be approximate).     Assessment:  This is a routine wellness examination for [facility].  Hearing/Vision screen Hearing Screening - Comments:: C/o mild hearing difficulties   Vision Screening - Comments:: Wears rx glasses - up to date with routine eye exams with [facility]  Dietary issues and exercise activities discussed: Current Exercise Habits: Home exercise routine, Type of exercise: walking, Time (Minutes): 10, Frequency (Times/Week): 7, Weekly Exercise (Minutes/Week): 70, Intensity: Mild, Exercise limited by: neurologic condition(s)   Goals Addressed             This Visit's Progress    LIFESTYLE - DECREASE FALLS RISK         Depression Screen PHQ 2/9 Scores 07/20/2021 04/20/2021 01/28/2020 10/28/2019 07/30/2019 04/14/2019 01/25/2019  PHQ - 2 Score 0 0 2 0 0 0 0  PHQ- 9 Score - 0 9 - - - -    Fall Risk Fall Risk  07/20/2021 04/20/2021 01/28/2020 10/28/2019 07/30/2019  Falls in the past year? 1 0 0 1 1  Number falls in past yr: 1 - - 1 1  Injury with Fall? 1 - - 1 0  Risk Factor Category  - - - - -  Risk for fall due to : History of fall(s);Impaired balance/gait;Orthopedic patient - - Impaired balance/gait -  Follow up Education provided;Falls prevention discussed - - Falls evaluation completed -    FALL RISK PREVENTION  PERTAINING TO THE HOME:  Any stairs in or around the home? No  If so, are there any without handrails? No  Home free of loose throw rugs in walkways, pet beds, electrical cords, etc? Yes  Adequate lighting in your home to reduce risk of falls? Yes   ASSISTIVE DEVICES UTILIZED TO PREVENT FALLS:  Life alert? No  Use of a cane, walker or w/c? Yes  Grab bars in the bathroom? Yes  Shower chair or bench in shower? No  Elevated toilet seat or a handicapped toilet? No   TIMED UP AND GO:  Was the test performed? No . Telephonic visit  Cognitive Function: MMSE - Mini Mental State Exam 02/23/2021  Orientation to time 2  Orientation to Place 4  Registration 3  Attention/ Calculation 0  Recall 0  Language- name 2 objects 2  Language- repeat 0  Language- follow 3 step command 3  Language- read & follow direction 1  Write a sentence 0  Copy design 0  Total score 15     6CIT Screen 07/20/2021  What Year? 0 points  What month? 0 points  What time? 0 points  Count back from 20 0 points  Months in reverse 4 points  Repeat phrase 10 points  Total Score 14    Immunizations Immunization History  Administered Date(s) Administered   Pneumococcal Conjugate-13 07/30/2019   Pneumococcal Polysaccharide-23 07/15/2018   Tdap 01/25/2019    TDAP status: Up to date  Flu Vaccine status: Declined, Education has been provided regarding the importance of this vaccine but patient still declined. Advised may receive this vaccine at local pharmacy or Health Dept. Aware to provide a copy of the vaccination record if obtained from local pharmacy or Health Dept. Verbalized acceptance and understanding.  Pneumococcal vaccine status: Up to date  Covid-19 vaccine status: Declined, Education has been provided regarding the importance of this vaccine but patient still declined. Advised may receive this vaccine at local pharmacy or Health Dept.or vaccine clinic. Aware to provide a copy of the vaccination  record if obtained from local pharmacy or Health Dept. Verbalized acceptance and  understanding.  Qualifies for Shingles Vaccine? Yes   Zostavax completed No   Shingrix Completed?: No.    Education has been provided regarding the importance of this vaccine. Patient has been advised to call insurance company to determine out of pocket expense if they have not yet received this vaccine. Advised may also receive vaccine at local pharmacy or Health Dept. Verbalized acceptance and understanding.  Screening Tests Health Maintenance  Topic Date Due   Zoster Vaccines- Shingrix (1 of 2) Never done   TETANUS/TDAP  01/24/2029   Pneumonia Vaccine 1+ Years old  Completed   HPV VACCINES  Aged Out   INFLUENZA VACCINE  Discontinued   COVID-19 Vaccine  Discontinued    Health Maintenance  Health Maintenance Due  Topic Date Due   Zoster Vaccines- Shingrix (1 of 2) Never done    Colorectal cancer screening: No longer required.   Lung Cancer Screening: (Low Dose CT Chest recommended if Age 47-80 years, 30 pack-year currently smoking OR have quit w/in 15years.) does not qualify.   Additional Screening:  Hepatitis C Screening: does not qualify  Vision Screening: Recommended annual ophthalmology exams for early detection of glaucoma and other disorders of the eye. Is the patient up to date with their annual eye exam?  Yes  Who is the provider or what is the name of the office in which the patient attends annual eye exams? [facility] [place] If pt is not established with a provider, would they like to be referred to a provider to establish care? No .   Dental Screening: Recommended annual dental exams for proper oral hygiene  Community Resource Referral / Chronic Care Management: CRR required this visit?  No   CCM required this visit?  No      Plan:     I have personally reviewed and noted the following in the patients chart:   Medical and social history Use of alcohol, tobacco or  illicit drugs  Current medications and supplements including opioid prescriptions. Patient is not currently taking opioid prescriptions. Functional ability and status Nutritional status Physical activity Advanced directives List of other physicians Hospitalizations, surgeries, and ER visits in previous 12 months Vitals Screenings to include cognitive, depression, and falls Referrals and appointments  In addition, I have reviewed and discussed with patient certain preventive protocols, quality metrics, and best practice recommendations. A written personalized care plan for preventive services as well as general preventive health recommendations were provided to patient.     [place] Constable, LPN   0/34/7425   Nurse Notes: 6CIT = 14

## 2021-08-30 ENCOUNTER — Ambulatory Visit: Payer: Medicare HMO | Admitting: Urology

## 2021-09-17 ENCOUNTER — Telehealth: Payer: Self-pay

## 2021-09-17 DIAGNOSIS — N401 Enlarged prostate with lower urinary tract symptoms: Secondary | ICD-10-CM

## 2021-09-17 MED ORDER — TAMSULOSIN HCL 0.4 MG PO CAPS: 0.4000 mg | ORAL_CAPSULE | Freq: Every day | ORAL | 3 refills | Status: DC

## 2021-09-17 MED ORDER — OXYBUTYNIN CHLORIDE 5 MG PO TABS: ORAL_TABLET | ORAL | 3 refills | Status: DC

## 2021-09-17 NOTE — Telephone Encounter (Signed)
Patient needing refill on medication.  ? ? ?Medication: ?tamsulosin (FLOMAX) 0.4 MG CAPS capsule ?oxybutynin (DITROPAN) 5 MG tablet ? ? ?Pharmacy: ? ?[place], [place] ?

## 2021-09-17 NOTE — Telephone Encounter (Signed)
Refill submitted as requested.

## 2021-09-27 ENCOUNTER — Encounter: Payer: Self-pay | Admitting: Urology

## 2021-09-27 ENCOUNTER — Ambulatory Visit: Payer: Medicare HMO | Admitting: Urology

## 2021-09-27 VITALS — BP 92/51 | HR 81

## 2021-09-27 DIAGNOSIS — R339 Retention of urine, unspecified: Secondary | ICD-10-CM

## 2021-09-27 DIAGNOSIS — R3915 Urgency of urination: Secondary | ICD-10-CM | POA: Diagnosis not present

## 2021-09-27 DIAGNOSIS — N401 Enlarged prostate with lower urinary tract symptoms: Secondary | ICD-10-CM | POA: Diagnosis not present

## 2021-09-27 NOTE — Progress Notes (Signed)
?Subjective: ? ?1. Benign prostatic hyperplasia with lower urinary tract symptoms, symptom details unspecified   ?2. Urgency of urination   ?3. Incomplete bladder emptying   ? ?  ? ?Terry Sloan is a 81 year-old male established patient who is here for symptoms of enlarged prostate. ? ?09/27/21: Terry Sloan returns today in f/u for his history of BPH with BOO.  He remains on tamsulosin and oxybutybnin at bed time.  He is doing well with an IPSS of 15 and nocturia x 1.  He had been off of the meds for a while but it is back on it.  He has a sensation of incomplete emptying off of the med.  His UA is clear.   He has had no dysuria or hematuria.   His PVR is 47ml.  ? ?08/24/20: Terry Sloan returns today in f/u. He had a negative prostate biopsy on 11/21/17 for a prostate nodule. He has had resolution of his voiding symptoms with the tamsulosin and oxybutynin he was given for BPH with BOO.  He has no further GI complaints.  HIs IPSS is 7. He has nocturia x12 and he has some urgency but he has a good stream.  He has had no hematuria or dysuria.   His PSA is 1.0 with 45% f/t ratio on 07/30/19.   He has no associated signs or symptoms.  He was unable to get a UA today.  ? ? ?GU Hx: Terry Sloan is a 81 yo WM who is sent in consultation by Dr. Ermalinda Memos for LUTs with incontinence. He has a several year history of progressive voiding symptoms. He had hesitancy with a reduced stream. The symptoms got worse about 2 years ago following a circumcision in [place]. He has not been able to get an erection since. He was started on Tamsulosin on 10/16/17 and his flow improved rapidly but he began to have nocturnal enuresis. He wakes having incontinence. He has no hematuria or dysuria. He feels like empties and has no intermittency. He has some daytime frequency. He has nocturia x 1. He has had no UTI's. He has had two prior stones. He passed them both. He has had no other GU surgery. His PSA was 2.4 2 years ago but only 1.2 this month. His UA is ok today.   ? ? ? IPSS   ? ? Row Name 09/27/21 1500  ?  ?  ?  ? International Prostate Symptom Score  ? How often have you had the sensation of not emptying your bladder? More than half the time    ? How often have you had to urinate less than every two hours? Less than half the time    ? How often have you found you stopped and started again several times when you urinated? Less than half the time    ? How often have you found it difficult to postpone urination? Less than half the time    ? How often have you had a weak urinary stream? Less than half the time    ? How often have you had to strain to start urination? Less than half the time    ? How many times did you typically get up at night to urinate? 1 Time    ? Total IPSS Score 15    ? ?  ?  ? ?  ?  ? ?ROS: ? ?ROS:  ?A complete review of systems was performed.  All systems are negative except for pertinent findings as  noted.  ? ?Review of Systems  ?Neurological:  Positive for dizziness.  ?All other systems reviewed and are negative. ? ?Allergies  ?Allergen Reactions  ? Celebrex [Celecoxib] Other (See Comments)  ?  Leg swelling, broke out in rash  ? ? ?Outpatient Encounter Medications as of 09/27/2021  ?Medication Sig  ? oxybutynin (DITROPAN) 5 MG tablet TAKE ONE (1) TABLET EACH DAY  ? PARoxetine (PAXIL) 20 MG tablet Take 1 tablet (20 mg total) by mouth daily.  ? pravastatin (PRAVACHOL) 40 MG tablet Take 1 tablet (40 mg total) by mouth daily.  ? tamsulosin (FLOMAX) 0.4 MG CAPS capsule Take 1 capsule (0.4 mg total) by mouth daily after supper.  ? acetaminophen (TYLENOL) 500 MG tablet Take 1 tablet (500 mg total) by mouth every 6 (six) hours as needed. (Patient not taking: Reported on 09/27/2021)  ? ?No facility-administered encounter medications on file as of 09/27/2021.  ? ? ?Past Medical History:  ?Diagnosis Date  ? Arthritis   ? GERD (gastroesophageal reflux disease)   ? [name]  ? History of kidney stones   ? Hyperlipidemia   ? Macular degeneration   ? PVD (peripheral  vascular disease) (HCC)   ? Stroke [facility](HCC)   ? TIA    ? ? ?Past Surgical History:  ?Procedure Laterality Date  ? Bone turmor Left   ? foreheah  ? EYE SURGERY Bilateral   ? cataract removal  ? FRACTURE SURGERY Left   ? leg   pins  ? KNEE ARTHROPLASTY    ? KNEE SURGERY Left   ? NECK SURGERY    ? Tumor reomoved from head/neck in the 60s  ? REPLACEMENT TOTAL KNEE Left 07/14/2018  ? ROTATOR CUFF REPAIR Right   ? times 2  ? TOTAL KNEE ARTHROPLASTY WITH HARDWARE REMOVAL Left 02/14/2017  ? Procedure: Removal Left Tibia Nail, Cemented Total Knee Arthroplasty;  Surgeon: Eldred MangesYates, Mark C, MD;  Location: [facility] OR;  Service: Orthopedics;  Laterality: Left;  ? ? ?Social History  ? ?Socioeconomic History  ? Marital status: Media plannerDomestic Partner  ?  Spouse name: Not on file  ? Number of children: 9  ? Years of education: Not on file  ? Highest education level: Not on file  ?Occupational History  ? Occupation: Retired  ?Tobacco Use  ? Smoking status: Former  ?  Packs/day: 1.50  ?  Years: 20.00  ?  Pack years: 30.00  ?  Types: Cigarettes  ? Smokeless tobacco: Never  ? Tobacco comments:  ?  Quit 20+ year ago  ?Vaping Use  ? Vaping Use: Never used  ?Substance and Sexual Activity  ? Alcohol use: Yes  ?  Comment: Occasional beer  ? Drug use: No  ? Sexual activity: Not on file  ?Other Topics Concern  ? Not on file  ?Social History Narrative  ? Lives at home w/ his significant other  ? Right-handed  ? Caffeine: some coffee, 3 diet Mt Dew's per day  ? ?Social Determinants of Health  ? ?Financial Resource Strain: Low Risk   ? Difficulty of Paying Living Expenses: Not hard at all  ?Food Insecurity: No Food Insecurity  ? Worried About Programme researcher, broadcasting/film/videounning Out of Food in the Last Year: Never true  ? Ran Out of Food in the Last Year: Never true  ?Transportation Needs: No Transportation Needs  ? Lack of Transportation (Medical): No  ? Lack of Transportation (Non-Medical): No  ?Physical Activity: Insufficiently Active  ? Days of Exercise per Week: 7 days  ? Minutes  of  Exercise per Session: 10 min  ?Stress: No Stress Concern Present  ? Feeling of Stress : Only a little  ?Social Connections: Unknown  ? Frequency of Communication with Friends and Family: More than three times a week  ? Frequency of Social Gatherings with Friends and Family: More than three times a week  ? Attends Religious Services: Not on file  ? Active Member of Clubs or Organizations: No  ? Attends Banker Meetings: Never  ? Marital Status: Widowed  ?Intimate Partner Violence: Not At Risk  ? Fear of Current or Ex-Partner: No  ? Emotionally Abused: No  ? Physically Abused: No  ? Sexually Abused: No  ? ? ?Family History  ?Problem Relation Age of Onset  ? Stroke Neg Hx   ? ? ? ? ? ?Objective: ?Vitals:  ? 09/27/21 1514  ?BP: (!) 92/51  ?Pulse: 81  ? ? ? ?Physical Exam ?Vitals reviewed.  ?Constitutional:   ?   Appearance: Normal appearance.  ?Neurological:  ?   Mental Status: He is alert.  ? ? ?Lab Results:  ?No results found for this or any previous visit (from the past 24 hour(s)).  ?BMET ?No results for input(s): NA, K, CL, CO2, GLUCOSE, BUN, CREATININE, CALCIUM in the last 72 hours. ?PSA ?No results found for: PSA ?No results found for: TESTOSTERONE ? ? ? ?Studies/Results: ?No results found. ? ? ? ?Assessment & Plan: ?BPH with BOO and urgency.  He is doing well on current therapy.  Tamsulosin and oxybutynin were refilled on 09/17/21.    F/u in 1 year.  ? ?  ? ?No orders of the defined types were placed in this encounter. ?  ? ?Orders Placed This Encounter  ?Procedures  ? Urinalysis, Routine w reflex microscopic  ? Bladder Scan (Post Void Residual) in office  ?  ? ? ?Return in about 1 year (around 09/28/2022). ? ? ?CC: Dettinger, Elige Radon, MD   ? ? ? ?Terry Sloan ?09/27/2021 ? ?

## 2021-09-28 LAB — URINALYSIS, ROUTINE W REFLEX MICROSCOPIC
Bilirubin, UA: NEGATIVE
Glucose, UA: NEGATIVE
Leukocytes,UA: NEGATIVE
Nitrite, UA: NEGATIVE
Protein,UA: NEGATIVE
RBC, UA: NEGATIVE
Specific Gravity, UA: 1.025 (ref 1.005–1.030)
Urobilinogen, Ur: 0.2 mg/dL (ref 0.2–1.0)
pH, UA: 5.5 (ref 5.0–7.5)

## 2021-10-22 ENCOUNTER — Encounter: Payer: Self-pay | Admitting: Family Medicine

## 2021-10-22 ENCOUNTER — Ambulatory Visit (INDEPENDENT_AMBULATORY_CARE_PROVIDER_SITE_OTHER): Payer: Medicare HMO | Admitting: Family Medicine

## 2021-10-22 VITALS — BP 125/76 | HR 73 | Temp 98.0°F | Ht 69.0 in | Wt 180.0 lb

## 2021-10-22 DIAGNOSIS — Z23 Encounter for immunization: Secondary | ICD-10-CM

## 2021-10-22 DIAGNOSIS — N3281 Overactive bladder: Secondary | ICD-10-CM

## 2021-10-22 DIAGNOSIS — N401 Enlarged prostate with lower urinary tract symptoms: Secondary | ICD-10-CM | POA: Diagnosis not present

## 2021-10-22 DIAGNOSIS — M21372 Foot drop, left foot: Secondary | ICD-10-CM | POA: Diagnosis not present

## 2021-10-22 DIAGNOSIS — E782 Mixed hyperlipidemia: Secondary | ICD-10-CM | POA: Diagnosis not present

## 2021-10-22 DIAGNOSIS — R296 Repeated falls: Secondary | ICD-10-CM | POA: Diagnosis not present

## 2021-10-22 DIAGNOSIS — F419 Anxiety disorder, unspecified: Secondary | ICD-10-CM

## 2021-10-22 DIAGNOSIS — F32A Depression, unspecified: Secondary | ICD-10-CM | POA: Diagnosis not present

## 2021-10-22 NOTE — Progress Notes (Signed)
BP 125/76   Pulse 73   Temp 98 F (36.7 C)   Ht 5\' 9"  (1.753 m)   Wt 180 lb (81.6 kg)   SpO2 95%   BMI 26.58 kg/m    Subjective:   Patient ID: Terry Sloan, male    DOB: 25-Mar-1941, 81 y.o.   MRN: [id number]  HPI: AROL COTTMAN is a 81 y.o. male presenting on 10/22/2021 for Medical Management of Chronic Issues   HPI Hyperlipidemia Patient is coming in for recheck of his hyperlipidemia. The patient is currently taking pravastatin. They deny any issues with myalgias or history of liver damage from it. They deny any focal numbness or weakness or chest pain.   Overactive bladder and BPH Patient sees urology for overactive bladder and BPH and feels like things are going well.  Patient currently takes oxybutynin and Flomax  Patient is on Paxil for anxiety depression.  Per his daughter he has been much more calm and much happier and doing well.  Patient is brought in here with his daughter who does complain that he has been falling a lot more and he complains of his left foot that he has a foot drop and cannot lift it up and sometimes will catch and then he will stumble and fall.  Most recently he fell 3 weeks ago and stumbled and fell and bruised up the whole front of his legs and part of his arms and hands.  He did not fracture anything.  Relevant past medical, surgical, family and social history reviewed and updated as indicated. Interim medical history since our last visit reviewed. Allergies and medications reviewed and updated.  Review of Systems  Constitutional:  Negative for chills and fever.  Eyes:  Negative for visual disturbance.  Respiratory:  Negative for shortness of breath and wheezing.   Cardiovascular:  Negative for chest pain and leg swelling.  Musculoskeletal:  Positive for arthralgias and gait problem. Negative for back pain and joint swelling.  Skin:  Negative for rash.  Neurological:  Positive for weakness. Negative for speech difficulty and numbness.  All other  systems reviewed and are negative.  Per HPI unless specifically indicated above   Allergies as of 10/22/2021       Reactions   Celebrex [celecoxib] Other (See Comments)   Leg swelling, broke out in rash        Medication List        Accurate as of Oct 22, 2021  3:50 PM. If you have any questions, ask your nurse or doctor.          STOP taking these medications    acetaminophen 500 MG tablet Commonly known as: TYLENOL Stopped by: Fransisca Kaufmann Jullien Granquist, MD       TAKE these medications    naproxen sodium 220 MG tablet Commonly known as: ALEVE Take 220 mg by mouth.   oxybutynin 5 MG tablet Commonly known as: DITROPAN TAKE ONE (1) TABLET EACH DAY   PARoxetine 20 MG tablet Commonly known as: Paxil Take 1 tablet (20 mg total) by mouth daily.   pravastatin 40 MG tablet Commonly known as: PRAVACHOL Take 1 tablet (40 mg total) by mouth daily.   tamsulosin 0.4 MG Caps capsule Commonly known as: FLOMAX Take 1 capsule (0.4 mg total) by mouth daily after supper.         Objective:   BP 125/76   Pulse 73   Temp 98 F (36.7 C)   Ht 5\' 9"  (1.753 m)  Wt 180 lb (81.6 kg)   SpO2 95%   BMI 26.58 kg/m   Wt Readings from Last 3 Encounters:  10/22/21 180 lb (81.6 kg)  07/20/21 177 lb (80.3 kg)  06/29/21 177 lb 2 oz (80.3 kg)    Physical Exam Vitals and nursing note reviewed.  Constitutional:      General: He is not in acute distress.    Appearance: He is well-developed. He is not diaphoretic.  Eyes:     General: No scleral icterus.    Conjunctiva/sclera: Conjunctivae normal.  Neck:     Thyroid: No thyromegaly.  Cardiovascular:     Rate and Rhythm: Normal rate and regular rhythm.     Heart sounds: Normal heart sounds. No murmur heard. Pulmonary:     Effort: Pulmonary effort is normal. No respiratory distress.     Breath sounds: Normal breath sounds. No wheezing.  Musculoskeletal:        General: No swelling. Normal range of motion.     Cervical back:  Neck supple.     Comments: 4 out of 5 strength in both lower extremities  Lymphadenopathy:     Cervical: No cervical adenopathy.  Skin:    General: Skin is warm and dry.     Findings: No rash.  Neurological:     Mental Status: He is alert and oriented to person, place, and time.     Coordination: Coordination normal.  Psychiatric:        Behavior: Behavior normal.      Assessment & Plan:   Problem List Items Addressed This Visit       Genitourinary   OAB (overactive bladder)   BPH (benign prostatic hyperplasia)     Other   Mixed hyperlipidemia - Primary   Anxiety and depression   Other Visit Diagnoses     Need for shingles vaccine       Relevant Orders   Varicella-zoster vaccine IM (Shingrix) (Completed)   Recurrent falls       Left foot drop           Will send to physical therapy because of recurrent falls to see if we can prevent future falls.  Also recommended a walker but patient will not use 1 per daughter.  We will do blood work today but no other changes. Follow up plan: Return in about 6 months (around 04/24/2022), or if symptoms worsen or fail to improve, for Anxiety and hyperlipidemia.  Counseling provided for all of the vaccine components Orders Placed This Encounter  Procedures   Varicella-zoster vaccine IM (Shingrix)    Caryl Pina, MD [facility] 10/22/2021, 3:50 PM

## 2021-10-23 LAB — CMP14+EGFR
ALT: 11 IU/L (ref 0–44)
AST: 21 IU/L (ref 0–40)
Albumin/Globulin Ratio: 1.8 (ref 1.2–2.2)
Albumin: 4.3 g/dL (ref 3.6–4.6)
Alkaline Phosphatase: 81 IU/L (ref 44–121)
BUN/Creatinine Ratio: 10 (ref 10–24)
BUN: 16 mg/dL (ref 8–27)
Bilirubin Total: 0.5 mg/dL (ref 0.0–1.2)
CO2: 19 mmol/L — ABNORMAL LOW (ref 20–29)
Calcium: 9 mg/dL (ref 8.6–10.2)
Chloride: 104 mmol/L (ref 96–106)
Creatinine, Ser: 1.62 mg/dL — ABNORMAL HIGH (ref 0.76–1.27)
Globulin, Total: 2.4 g/dL (ref 1.5–4.5)
Glucose: 90 mg/dL (ref 70–99)
Potassium: 4.3 mmol/L (ref 3.5–5.2)
Sodium: 138 mmol/L (ref 134–144)
Total Protein: 6.7 g/dL (ref 6.0–8.5)
eGFR: 42 mL/min/{1.73_m2} — ABNORMAL LOW (ref >59)

## 2021-10-23 LAB — CBC WITH DIFFERENTIAL/PLATELET
Basophils Absolute: 0 10*3/uL (ref 0.0–0.2)
Basos: 0 %
EOS (ABSOLUTE): 0.1 10*3/uL (ref 0.0–0.4)
Eos: 3 %
Hematocrit: 38.6 % (ref 37.5–51.0)
Hemoglobin: 12.9 g/dL — ABNORMAL LOW (ref 13.0–17.7)
Immature Grans (Abs): 0 10*3/uL (ref 0.0–0.1)
Immature Granulocytes: 0 %
Lymphocytes Absolute: 1.2 10*3/uL (ref 0.7–3.1)
Lymphs: 27 %
MCH: 31.9 pg (ref 26.6–33.0)
MCHC: 33.4 g/dL (ref 31.5–35.7)
MCV: 96 fL (ref 79–97)
Monocytes Absolute: 0.6 10*3/uL (ref 0.1–0.9)
Monocytes: 12 %
Neutrophils Absolute: 2.6 10*3/uL (ref 1.4–7.0)
Neutrophils: 58 %
Platelets: 196 10*3/uL (ref 150–450)
RBC: 4.04 x10E6/uL — ABNORMAL LOW (ref 4.14–5.80)
RDW: 12.5 % (ref 11.6–15.4)
WBC: 4.6 10*3/uL (ref 3.4–10.8)

## 2021-10-23 LAB — LIPID PANEL
Chol/HDL Ratio: 4 ratio (ref 0.0–5.0)
Cholesterol, Total: 170 mg/dL (ref 100–199)
HDL: 43 mg/dL (ref >39)
LDL Chol Calc (NIH): 98 mg/dL (ref 0–99)
Triglycerides: 164 mg/dL — ABNORMAL HIGH (ref 0–149)
VLDL Cholesterol Cal: 29 mg/dL (ref 5–40)

## 2021-11-01 ENCOUNTER — Telehealth: Payer: Self-pay | Admitting: Family Medicine

## 2021-11-01 NOTE — Telephone Encounter (Signed)
[facility] called to let PCP know that they have reached out to pt twice for scheduling PT and pt has refused. Says he is going to go somewhere in [place] for PT.

## 2021-11-12 NOTE — Telephone Encounter (Signed)
Pts daughter called stating that pt would like to have PT in the building located in front of [facility] so that his daughter can go with him to his appts.

## 2021-11-13 ENCOUNTER — Other Ambulatory Visit: Payer: Self-pay | Admitting: Family Medicine

## 2021-11-13 DIAGNOSIS — M21372 Foot drop, left foot: Secondary | ICD-10-CM

## 2021-11-13 DIAGNOSIS — R296 Repeated falls: Secondary | ICD-10-CM

## 2021-11-13 NOTE — Telephone Encounter (Signed)
Placed new referral for medicine

## 2021-11-13 NOTE — Progress Notes (Signed)
Placed new referral for patient.

## 2021-11-20 ENCOUNTER — Ambulatory Visit: Payer: Medicare HMO | Attending: Family Medicine

## 2021-11-20 DIAGNOSIS — M21372 Foot drop, left foot: Secondary | ICD-10-CM | POA: Insufficient documentation

## 2021-11-20 DIAGNOSIS — Z9181 History of falling: Secondary | ICD-10-CM | POA: Diagnosis not present

## 2021-11-20 DIAGNOSIS — R296 Repeated falls: Secondary | ICD-10-CM | POA: Diagnosis not present

## 2021-11-20 DIAGNOSIS — M6281 Muscle weakness (generalized): Secondary | ICD-10-CM | POA: Diagnosis not present

## 2021-11-20 NOTE — Therapy (Addendum)
[facility] [place], [place], [address] Phone: [phone]   Fax:  [phone]  Physical Therapy Evaluation  Patient Details  Name: Terry Sloan MRN: [id number] Date of Birth: 02/18/1941 Referring Provider (PT): Dettinger, MD   Encounter Date: 11/20/2021   PT End of Session - 11/20/21 1519     Visit Number 1    Number of Visits 12    Date for PT Re-Evaluation 01/25/22    PT Start Time 1520    PT Stop Time 1600    PT Time Calculation (min) 40 min    Activity Tolerance Patient tolerated treatment well    Behavior During Therapy [facility] for tasks assessed/performed             Past Medical History:  Diagnosis Date   Arthritis    GERD (gastroesophageal reflux disease)    [name]   History of kidney stones    Hyperlipidemia    Macular degeneration    PVD (peripheral vascular disease) ([place])    Stroke ([place])    TIA      Past Surgical History:  Procedure Laterality Date   Bone turmor Left    foreheah   EYE SURGERY Bilateral    cataract removal   FRACTURE SURGERY Left    leg   pins   KNEE ARTHROPLASTY     KNEE SURGERY Left    NECK SURGERY     Tumor reomoved from head/neck in the 60s   REPLACEMENT TOTAL KNEE Left 07/14/2018   ROTATOR CUFF REPAIR Right    times 2   TOTAL KNEE ARTHROPLASTY WITH HARDWARE REMOVAL Left 02/14/2017   Procedure: Removal Left Tibia Nail, Cemented Total Knee Arthroplasty;  Surgeon: Marybelle Killings, MD;  Location: [facility];  Service: Orthopedics;  Laterality: Left;    There were no vitals filed for this visit.    Subjective Assessment - 11/20/21 1519     Subjective Patient reports that he has fallen about 7-8 times over the past 3-6 months with the most recent fall being this week. He feels that it is his left foot that causes him to fall. His daughter notes that he has been falling for about the past year, but it has gotten worse over the past 6 months. His daughter notes that his left leg  was giving out on him and he had a left TKA, but he has not been the same since.    Pertinent History multiple TKA's, memory impairments    Patient Stated Goals improved safety and mobility    Currently in Pain? No/denies                [facility] PT Assessment - 11/20/21 0001       Assessment   Medical Diagnosis Recurrent falls    Referring Provider (PT) Dettinger, MD    Onset Date/Surgical Date --   about year ago   Next MD Visit none scheduled    Prior Therapy No      Precautions   Precautions Fall      Restrictions   Weight Bearing Restrictions No      Balance Screen   Has the patient fallen in the past 6 months Yes    How many times? 6-7    Has the patient had a decrease in activity level because of a fear of falling?  Yes    Is the patient reluctant to leave their home because of a fear of falling?  Yes  Home Environment   Living Environment Private residence    Living Arrangements Spouse/significant other    [place] to enter;Ramped entrance    [place] of Steps 3-4    Entrance Stairs-Rails Can reach both    Home Layout One level      Prior Function   Level of Independence Independent    Vocation Retired    Leisure gardening, working in his garage      Cognition   Overall Cognitive Status Impaired/Different from baseline    Area of Impairment Memory    Attention Focused    Focused Attention Appears intact    Memory Impaired    Memory Impairment Decreased long term memory;Decreased short term memory;Retrieval deficit    Awareness Appears intact    Problem Solving Appears intact      ROM / Strength   AROM / PROM / Strength Strength;AROM;PROM      AROM   Overall AROM  Deficits   Left ankle     PROM   Overall PROM  Deficits   Left ankle     Strength   Strength Assessment Site Hip;Ankle;Knee    Right/Left Hip Right;Left    Right Hip Flexion 5/5    Left Hip Flexion 4/5    Right/Left Knee Right;Left    Right Knee Flexion  5/5    Right Knee Extension 5/5    Left Knee Flexion 5/5    Left Knee Extension 5/5    Right/Left Ankle Right;Left    Right Ankle Dorsiflexion 4/5    Left Ankle Dorsiflexion 3+/5      Transfers   Transfers Sit to Stand;Stand to Sit    Sit to Stand 6: Modified independent (Device/Increase time);Without upper extremity assist    Five time sit to stand comments  16 seconds    Stand to Sit 6: Modified independent (Device/Increase time);Without upper extremity assist      Ambulation/Gait   Ambulation/Gait Yes    Ambulation/Gait Assistance 6: Modified independent (Device/Increase time)    Assistive device None    Gait Pattern Decreased stride length;Step-through pattern;Right foot flat;Left foot flat;Poor foot clearance - left    Ambulation Surface Level;Indoor    Gait velocity decreased      Standardized Balance Assessment   Standardized Balance Assessment Timed Up and Go Test      Timed Up and Go Test   TUG Normal TUG    Normal TUG (seconds) 15.9                        Objective measurements completed on examination: See above findings.       [facility] Adult PT Treatment/Exercise - 11/20/21 0001       Exercises   Exercises Knee/Hip      Knee/Hip Exercises: Stretches   Gastroc Stretch Left;2 reps;60 seconds      Knee/Hip Exercises: Seated   Other Seated Knee/Hip Exercises Toe raises   20 reps   Marching Both;20 reps                          PT Long Term Goals - 11/20/21 1647       PT LONG TERM GOAL #1   Title Patient will be able to complete his HEP with moderate cueing for proper exercise performance.    Time 6    Period Weeks    Status New    Target Date 01/01/22  PT LONG TERM GOAL #2   Title Patient will improve his five time sit to stand time to 14 seconds or less for improved lower extremity power and safety.    Time 6    Period Weeks    Status New    Target Date 01/01/22      PT LONG TERM GOAL #3   Title Patient will  improve his TUG time to 12 seconds or less for improved mobility and safety.    Time 6    Period Weeks    Status New    Target Date 01/01/22                    Plan - 11/20/21 1620     Clinical Impression Statement Patient is a 80 year old male presenting to physical therapy due to multiple falls due to poor left foot clearance. He exhibited foot left foot clearance while ambulating, but this did not occur consistently. He exhibited decreased left hip flexor and ankle dorsiflexor strength. He also exhibited reduced left ankle passive and AROM. He was provided a HEP which he was able to properly demonstrate. Recommend that he continue with skilled physical therapy to address his remaining impairments to maximize his safety and functional mboilty.    Personal Factors and Comorbidities Transportation;Other;Time since onset of injury/illness/exacerbation;Comorbidity 1;Age    Comorbidities History of TIA    Examination-Activity Limitations Locomotion Level;Transfers;Stairs    Examination-Participation Restrictions Community Activity;Yard Work    Stability/Clinical Decision Making Unstable/Unpredictable    Surveyor, mining    Rehab Potential Fair    PT Frequency 2x / week    PT Duration 6 weeks    PT Treatment/Interventions ADLs/Self Care Home Management;Electrical Stimulation;Gait training;Stair training;Functional mobility training;Therapeutic activities;Therapeutic exercise;Balance training;Neuromuscular re-education;Patient/family education;Manual techniques;Passive range of motion    PT Next Visit Plan nustep, ankle ROM, and LE strengthening    PT Home Exercise Plan Access Code: [email or web address]  URL: [email or web address]  Date: 11/20/2021  Prepared by: Jacqulynn Cadet    Exercises  - Long Sitting Calf Stretch with Strap  - 2 x daily - 7 x weekly - 3 sets - 30 seconds  hold  - Seated Toe Raise  - 2 x daily - 7 x weekly - 2 sets - 10 reps  - Seated March  - 2 x daily -  7 x weekly - 2 sets - 10 reps    Consulted and Agree with Plan of Care Patient;Family member/caregiver    Family Member Consulted daughter             Patient will benefit from skilled therapeutic intervention in order to improve the following deficits and impairments:  Abnormal gait, Decreased range of motion, Difficulty walking, Decreased activity tolerance, Decreased balance, Decreased strength, Decreased mobility  Visit Diagnosis: History of falling  Muscle weakness (generalized)     Problem List Patient Active Problem List   Diagnosis Date Noted   OAB (overactive bladder) 01/25/2019   BPH (benign prostatic hyperplasia) 07/27/2018   History of total left knee replacement 04/17/2017   History of TIA (transient ischemic attack) 10/16/2016   Aphasia 10/16/2016   Anxiety and depression 10/16/2016   PVD (peripheral vascular disease) ([place]) 09/23/2016   Mixed hyperlipidemia 09/23/2016   Weight loss, unintentional 09/23/2016   Rationale for Evaluation and Treatment Rehabilitation   Darlin Coco, PT 11/20/2021, 7:27 PM  [facility] [address] [place], [place], [address] Phone: [phone]   Fax:  [phone]  Name: Terry Sloan MRN: [id number] Date of Birth: [birth year]  PHYSICAL THERAPY DISCHARGE SUMMARY  Visits from Start of Care: 1  Current functional level related to goals / functional outcomes: Patient is being discharged at this time as he did not return following his initial evaluation   Remaining deficits: See evaluation    Education / Equipment: HEP   Patient agrees to discharge. Patient goals were not met. Patient is being discharged due to not returning since the last visit.  Jacqulynn Cadet, PT, DPT

## 2021-11-27 ENCOUNTER — Encounter: Payer: Medicare HMO | Admitting: Physical Therapy

## 2021-11-29 ENCOUNTER — Encounter: Payer: Medicare HMO | Admitting: Physical Therapy

## 2021-12-10 ENCOUNTER — Ambulatory Visit (INDEPENDENT_AMBULATORY_CARE_PROVIDER_SITE_OTHER): Payer: Medicare HMO | Admitting: Nurse Practitioner

## 2021-12-10 ENCOUNTER — Ambulatory Visit (HOSPITAL_COMMUNITY)
Admission: RE | Admit: 2021-12-10 | Discharge: 2021-12-10 | Disposition: A | Discharge status: Home / Self Care | Payer: Medicare HMO | Source: Ambulatory Visit | Attending: Nurse Practitioner | Admitting: Nurse Practitioner

## 2021-12-10 ENCOUNTER — Encounter: Payer: Self-pay | Admitting: Nurse Practitioner

## 2021-12-10 VITALS — BP 136/71 | HR 68 | Temp 98.7°F | Ht 69.0 in | Wt 186.0 lb

## 2021-12-10 DIAGNOSIS — M7989 Other specified soft tissue disorders: Secondary | ICD-10-CM | POA: Diagnosis not present

## 2021-12-10 DIAGNOSIS — M79604 Pain in right leg: Secondary | ICD-10-CM | POA: Insufficient documentation

## 2021-12-10 DIAGNOSIS — R609 Edema, unspecified: Secondary | ICD-10-CM | POA: Insufficient documentation

## 2021-12-10 NOTE — Progress Notes (Signed)
Acute Office Visit  Subjective:     Patient ID: Terry Sloan, male    DOB: 11/17/1940, 81 y.o.   MRN: [id number]  Chief Complaint  Patient presents with   Leg Swelling    Leg and feet swelling     Leg Pain  The incident occurred 2 days ago. The incident occurred at home. The injury mechanism was a fall. The pain is present in the left leg and right leg. The quality of the pain is described as aching. The pain is at a severity of 7/10. The pain has been Constant since onset. Pertinent negatives include no loss of motion, loss of sensation, numbness or tingling. He reports no foreign bodies present. The symptoms are aggravated by movement. He has tried nothing for the symptoms.   Patient is in today for Edema: Patient complains of edema. The location of the edema is bilateral lower feet.  The edema has been mild.  Onset of symptoms was a few days ago, unchanged since that time. The edema is present all day. The patient states never.  The swelling has been aggravated by nothing, relieved by nothing, and been associated with risk for DVT warm extremities . Cardiac risk factors include advanced age (older than 66 for men, 31 for women) and male gender.   Review of Systems  Constitutional: Negative.   HENT: Negative.    Respiratory: Negative.    Cardiovascular:  Positive for leg swelling.  Genitourinary: Negative.   Musculoskeletal: Negative.   Skin: Negative.   Neurological:  Negative for tingling and numbness.  All other systems reviewed and are negative.       Objective:    BP 136/71   Pulse 68   Temp 98.7 F (37.1 C)   Ht 5\' 9"  (1.753 m)   Wt 186 lb (84.4 kg)   SpO2 96%   BMI 27.47 kg/m  BP Readings from Last 3 Encounters:  12/10/21 136/71  10/22/21 125/76  09/27/21 (!) 92/51   Wt Readings from Last 3 Encounters:  12/10/21 186 lb (84.4 kg)  10/22/21 180 lb (81.6 kg)  07/20/21 177 lb (80.3 kg)      Physical Exam Vitals and nursing note reviewed.  Constitutional:       Appearance: Normal appearance.  HENT:     Head: Normocephalic.     Right Ear: External ear normal.     Left Ear: External ear normal.     Nose: Nose normal.  Eyes:     Conjunctiva/sclera: Conjunctivae normal.  Cardiovascular:     Rate and Rhythm: Normal rate and regular rhythm.     Pulses: Normal pulses.     Heart sounds: Normal heart sounds.  Pulmonary:     Effort: Pulmonary effort is normal.     Breath sounds: Normal breath sounds.  Abdominal:     General: Bowel sounds are normal.  Musculoskeletal:        General: Swelling present.       Legs:     Comments: +1 bilateral lower extremity edema with right leg greater than left.  Feet:     Right foot:     Skin integrity: Warmth present.  Skin:    General: Skin is warm.     Findings: No erythema.  Neurological:     Mental Status: He is alert and oriented to person, place, and time.  Psychiatric:        Behavior: Behavior normal.     No results found for any visits  on 12/10/21.      Assessment & Plan:  Patient presents with bilateral extremity edema with right greater than left.  Pulses present.  Patient is afebrile and denies pain.  Advised patient to elevate feet and wear compression socks.  DME orders completed, ultrasound ordered to rule out DVT right leg.  Tylenol or ibuprofen as needed for pain.  Patient verbalized understanding.  He is told to follow-up with worsening or unresolved symptoms. Problem List Items Addressed This Visit   None Visit Diagnoses     Edema, unspecified type    -  Primary   Relevant Orders   For home use only DME Other see comment   US Venous Img Lower Unilateral Right (DVT) (Completed)   Right leg pain       Relevant Orders   US Venous Img Lower Unilateral Right (DVT) (Completed)       No orders of the defined types were placed in this encounter.   Return if symptoms worsen or fail to improve.  Daryll Drown, NP

## 2021-12-10 NOTE — Patient Instructions (Signed)
Edema ? ?Edema is when you have too much fluid in your body or under your skin. Edema may make your legs, feet, and ankles swell. Swelling often happens in looser tissues, such as around your eyes. This is a common condition. It gets more common as you get older. ?There are many possible causes of edema. These include: ?Eating too much salt (sodium). ?Being on your feet or sitting for a long time. ?Certain medical conditions, such as: ?Pregnancy. ?Heart failure. ?Liver disease. ?Kidney disease. ?Cancer. ?Hot weather may make edema worse. Edema is usually painless. Your skin may look swollen or shiny. ?Follow these instructions at home: ?Medicines ?Take over-the-counter and prescription medicines only as told by your doctor. ?Your doctor may prescribe a medicine to help your body get rid of extra water (diuretic). Take this medicine if you are told to take it. ?Eating and drinking ?Eat a low-salt (low-sodium) diet as told by your doctor. Sometimes, eating less salt may reduce swelling. ?Depending on the cause of your swelling, you may need to limit how much fluid you drink (fluid restriction). ?General instructions ?Raise the injured area above the level of your heart while you are sitting or lying down. ?Do not sit still or stand for a long time. ?Do not wear tight clothes. Do not wear garters on your upper legs. ?Exercise your legs. This can help the swelling go down. ?Wear compression stockings as told by your doctor. It is important that these are the right size. These should be prescribed by your doctor to prevent possible injuries. ?If elastic bandages or wraps are recommended, use them as told by your doctor. ?Contact a doctor if: ?Treatment is not working. ?You have heart, liver, or kidney disease and have symptoms of edema. ?You have sudden and unexplained weight gain. ?Get help right away if: ?You have shortness of breath or chest pain. ?You cannot breathe when you lie down. ?You have pain, redness, or  warmth in the swollen areas. ?You have heart, liver, or kidney disease and get edema all of a sudden. ?You have a fever and your symptoms get worse all of a sudden. ?These symptoms may be an emergency. Get help right away. Call 911. ?Do not wait to see if the symptoms will go away. ?Do not drive yourself to the hospital. ?Summary ?Edema is when you have too much fluid in your body or under your skin. ?Edema may make your legs, feet, and ankles swell. Swelling often happens in looser tissues, such as around your eyes. ?Raise the injured area above the level of your heart while you are sitting or lying down. ?Follow your doctor's instructions about diet and how much fluid you can drink. ?This information is not intended to replace advice given to you by your health care provider. Make sure you discuss any questions you have with your health care provider. ?Document Revised: 01/22/2021 Document Reviewed: 01/22/2021 ?Elsevier Patient Education ? 2023 [employer] ? ?

## 2022-01-25 ENCOUNTER — Encounter (INDEPENDENT_AMBULATORY_CARE_PROVIDER_SITE_OTHER): Payer: Medicare HMO | Admitting: Ophthalmology

## 2022-01-25 DIAGNOSIS — H43813 Vitreous degeneration, bilateral: Secondary | ICD-10-CM

## 2022-01-25 DIAGNOSIS — H353132 Nonexudative age-related macular degeneration, bilateral, intermediate dry stage: Secondary | ICD-10-CM

## 2022-02-21 ENCOUNTER — Other Ambulatory Visit: Payer: Self-pay | Admitting: Family Medicine

## 2022-02-21 ENCOUNTER — Encounter: Payer: Self-pay | Admitting: Family Medicine

## 2022-02-21 DIAGNOSIS — E782 Mixed hyperlipidemia: Secondary | ICD-10-CM

## 2022-02-21 NOTE — Telephone Encounter (Signed)
Letter sent.

## 2022-02-21 NOTE — Telephone Encounter (Signed)
Dettinger NTBS in Nov for 6 mos ckup. RF sent to pharmacy

## 2022-03-12 ENCOUNTER — Other Ambulatory Visit: Payer: Self-pay | Admitting: Family Medicine

## 2022-03-12 DIAGNOSIS — R413 Other amnesia: Secondary | ICD-10-CM

## 2022-03-12 DIAGNOSIS — F32A Depression, unspecified: Secondary | ICD-10-CM

## 2022-05-13 ENCOUNTER — Encounter: Payer: Self-pay | Admitting: Family Medicine

## 2022-05-13 ENCOUNTER — Ambulatory Visit (INDEPENDENT_AMBULATORY_CARE_PROVIDER_SITE_OTHER): Payer: Medicare HMO | Admitting: Family Medicine

## 2022-05-13 VITALS — BP 136/78 | HR 77 | Temp 97.3°F | Ht 69.0 in | Wt 188.0 lb

## 2022-05-13 DIAGNOSIS — S39012A Strain of muscle, fascia and tendon of lower back, initial encounter: Secondary | ICD-10-CM | POA: Diagnosis not present

## 2022-05-13 MED ORDER — METHYLPREDNISOLONE ACETATE 80 MG/ML IJ SUSP
80.0000 mg | Freq: Once | INTRAMUSCULAR | Status: AC
  Administered 2022-05-13: 80 mg via INTRA_ARTICULAR

## 2022-05-13 NOTE — Progress Notes (Signed)
BP 136/78   Pulse 77   Temp (!) 97.3 F (36.3 C)   Ht 5\' 9"  (1.753 m)   Wt 188 lb (85.3 kg)   SpO2 96%   BMI 27.76 kg/m    Subjective:   Patient ID: Terry Sloan, male    DOB: 1941-04-10, 81 y.o.   MRN: [id number]  HPI: Terry Sloan is a 81 y.o. male presenting on 05/13/2022 for Back Pain and Hip Pain (Radiates down both legs)   HPI Back pain and hip pain Patient comes in today with back pain and hip pain that is been going on for the past week.  He is here with his daughter because he does have some memory issues and says that he did have a little bit of a fall last week and has been complaining a little bit more about back pain and hip pain since that time.  He has also been progressively weaker in his legs and we tried to send her to physical therapy but she said he would only do 1 session for it and did not want to continue with that.  We have had that discussion multiple times before.  She will continue to try and work on him getting to do exercises at home because he will go to physical therapy.  Relevant past medical, surgical, family and social history reviewed and updated as indicated. Interim medical history since our last visit reviewed. Allergies and medications reviewed and updated.  Review of Systems  Constitutional:  Negative for chills and fever.  Respiratory:  Negative for shortness of breath and wheezing.   Cardiovascular:  Negative for chest pain and leg swelling.  Musculoskeletal:  Positive for arthralgias, back pain and myalgias. Negative for gait problem.  Skin:  Negative for rash.  All other systems reviewed and are negative.   Per HPI unless specifically indicated above   Allergies as of 05/13/2022       Reactions   Celebrex [celecoxib] Other (See Comments)   Leg swelling, broke out in rash        Medication List        Accurate as of May 13, 2022  4:22 PM. If you have any questions, ask your nurse or doctor.          naproxen  sodium 220 MG tablet Commonly known as: ALEVE Take 220 mg by mouth.   oxybutynin 5 MG tablet Commonly known as: DITROPAN TAKE ONE (1) TABLET EACH DAY   PARoxetine 20 MG tablet Commonly known as: PAXIL Take 1 tablet (20 mg total) by mouth daily. (NEEDS TO BE SEEN BEFORE NEXT REFILL)   pravastatin 40 MG tablet Commonly known as: PRAVACHOL TAKE ONE (1) TABLET BY MOUTH EVERY DAY   tamsulosin 0.4 MG Caps capsule Commonly known as: FLOMAX Take 1 capsule (0.4 mg total) by mouth daily after supper.         Objective:   BP 136/78   Pulse 77   Temp (!) 97.3 F (36.3 C)   Ht 5\' 9"  (1.753 m)   Wt 188 lb (85.3 kg)   SpO2 96%   BMI 27.76 kg/m   Wt Readings from Last 3 Encounters:  05/13/22 188 lb (85.3 kg)  12/10/21 186 lb (84.4 kg)  10/22/21 180 lb (81.6 kg)    Physical Exam Vitals and nursing note reviewed.  Constitutional:      General: He is not in acute distress.    Appearance: He is well-developed. He is not  diaphoretic.  Eyes:     General: No scleral icterus.    Conjunctiva/sclera: Conjunctivae normal.  Neck:     Thyroid: No thyromegaly.  Musculoskeletal:        General: Normal range of motion.     Lumbar back: Tenderness present. No spasms or bony tenderness. Normal range of motion. Negative right straight leg raise test and negative left straight leg raise test. No scoliosis.       Back:  Skin:    General: Skin is warm and dry.     Findings: No rash.  Neurological:     Mental Status: He is alert and oriented to person, place, and time.     Coordination: Coordination normal.  Psychiatric:        Behavior: Behavior normal.       Assessment & Plan:   Problem List Items Addressed This Visit   None Visit Diagnoses     Strain of lumbar region, initial encounter    -  Primary   Relevant Medications   methylPREDNISolone acetate (DEPO-MEDROL) injection 80 mg (Start on 05/13/2022  4:30 PM)       Give Depo-Medrol IM 80 mg.  Will see if that does well  for him.  Continue with Aleve and Tylenol and exercises.  If it does not improve then we may consider imaging in the future. Follow up plan: Return if symptoms worsen or fail to improve.  Counseling provided for all of the vaccine components No orders of the defined types were placed in this encounter.   Arville Care, MD [facility] 05/13/2022, 4:22 PM

## 2022-06-11 ENCOUNTER — Other Ambulatory Visit: Payer: Self-pay | Admitting: Family Medicine

## 2022-06-11 DIAGNOSIS — F419 Anxiety disorder, unspecified: Secondary | ICD-10-CM

## 2022-06-11 DIAGNOSIS — R413 Other amnesia: Secondary | ICD-10-CM

## 2022-06-11 DIAGNOSIS — E782 Mixed hyperlipidemia: Secondary | ICD-10-CM

## 2022-06-17 ENCOUNTER — Encounter: Payer: Self-pay | Admitting: Family Medicine

## 2022-06-17 ENCOUNTER — Ambulatory Visit (INDEPENDENT_AMBULATORY_CARE_PROVIDER_SITE_OTHER): Payer: Medicare HMO | Admitting: Family Medicine

## 2022-06-17 VITALS — BP 134/75 | HR 63 | Temp 98.7°F | Ht 69.0 in | Wt 179.0 lb

## 2022-06-17 DIAGNOSIS — N401 Enlarged prostate with lower urinary tract symptoms: Secondary | ICD-10-CM

## 2022-06-17 DIAGNOSIS — N3281 Overactive bladder: Secondary | ICD-10-CM | POA: Diagnosis not present

## 2022-06-17 DIAGNOSIS — E782 Mixed hyperlipidemia: Secondary | ICD-10-CM | POA: Diagnosis not present

## 2022-06-17 DIAGNOSIS — F32A Depression, unspecified: Secondary | ICD-10-CM | POA: Diagnosis not present

## 2022-06-17 DIAGNOSIS — F419 Anxiety disorder, unspecified: Secondary | ICD-10-CM | POA: Diagnosis not present

## 2022-06-17 DIAGNOSIS — Z23 Encounter for immunization: Secondary | ICD-10-CM

## 2022-06-17 MED ORDER — PAROXETINE HCL 40 MG PO TABS: 40.0000 mg | ORAL_TABLET | ORAL | 3 refills | Status: DC

## 2022-06-17 NOTE — Addendum Note (Signed)
Addended by: Geryl Rankins D on: 06/17/2022 05:00 PM   Modules accepted: Orders

## 2022-06-17 NOTE — Progress Notes (Signed)
BP 134/75   Pulse 63   Temp 98.7 F (37.1 C)   Ht 5\' 9"  (1.753 m)   Wt 179 lb (81.2 kg)   SpO2 97%   BMI 26.43 kg/m    Subjective:   Patient ID: Terry Sloan, male    DOB: November 09, 1940, 82 y.o.   MRN: [id number]  HPI: Terry Sloan is a 82 y.o. male presenting on 06/17/2022 for Medical Management of Chronic Issues   HPI Hyperlipidemia Patient is coming in for recheck of his hyperlipidemia. The patient is currently taking pravastatin. They deny any issues with myalgias or history of liver damage from it. They deny any focal numbness or weakness or chest pain.   Overactive and BPH bladder recheck Patient is currently on Ditropan for overactive bladder.  Has not been very well-controlled but does have a urology appointment coming up within the next couple weeks.  They will discuss it further with them.  He also takes Flomax for his prostate.  Anxiety and depression recheck Patient is not taking the Paxil for anxiety depression currently.  Stopped the Paxil because he did not feel like it was working although it did work initially, it seemed like it stopped.    06/17/2022    3:13 PM 05/13/2022    4:06 PM 12/10/2021   10:34 AM 07/20/2021    4:04 PM 04/20/2021    4:00 PM  Depression screen PHQ 2/9  Decreased Interest 1 0 0 0 0  Down, Depressed, Hopeless 1 0 0 0 0  PHQ - 2 Score 2 0 0 0 0  Altered sleeping 1 0   0  Tired, decreased energy 1 0   0  Change in appetite 0 0   0  Feeling bad or failure about yourself  1 0   0  Trouble concentrating 0 0   0  Moving slowly or fidgety/restless 1 0   0  Suicidal thoughts 0 0   0  PHQ-9 Score 6 0   0  Difficult doing work/chores Not difficult at all Not difficult at all        Relevant past medical, surgical, family and social history reviewed and updated as indicated. Interim medical history since our last visit reviewed. Allergies and medications reviewed and updated.  Review of Systems  Constitutional:  Negative for chills and fever.   Eyes:  Negative for visual disturbance.  Respiratory:  Negative for shortness of breath and wheezing.   Cardiovascular:  Negative for chest pain and leg swelling.  Musculoskeletal:  Negative for back pain and gait problem.  Skin:  Negative for rash.  Neurological:  Negative for dizziness, weakness and light-headedness.  All other systems reviewed and are negative.   Per HPI unless specifically indicated above   Allergies as of 06/17/2022       Reactions   Celebrex [celecoxib] Other (See Comments)   Leg swelling, broke out in rash        Medication List        Accurate as of June 17, 2022  3:27 PM. If you have any questions, ask your nurse or doctor.          naproxen sodium 220 MG tablet Commonly known as: ALEVE Take 220 mg by mouth.   oxybutynin 5 MG tablet Commonly known as: DITROPAN TAKE ONE (1) TABLET EACH DAY   PARoxetine 40 MG tablet Commonly known as: Paxil Take 1 tablet (40 mg total) by mouth every morning. What changed:  medication  strength how much to take when to take this Changed by: Worthy Rancher, MD   pravastatin 40 MG tablet Commonly known as: PRAVACHOL TAKE ONE (1) TABLET BY MOUTH EVERY DAY   tamsulosin 0.4 MG Caps capsule Commonly known as: FLOMAX Take 1 capsule (0.4 mg total) by mouth daily after supper.         Objective:   BP 134/75   Pulse 63   Temp 98.7 F (37.1 C)   Ht 5\' 9"  (1.753 m)   Wt 179 lb (81.2 kg)   SpO2 97%   BMI 26.43 kg/m   Wt Readings from Last 3 Encounters:  06/17/22 179 lb (81.2 kg)  05/13/22 188 lb (85.3 kg)  12/10/21 186 lb (84.4 kg)    Physical Exam Vitals and nursing note reviewed.  Constitutional:      General: He is not in acute distress.    Appearance: He is well-developed. He is not diaphoretic.  Eyes:     General: No scleral icterus.    Conjunctiva/sclera: Conjunctivae normal.  Neck:     Thyroid: No thyromegaly.  Cardiovascular:     Rate and Rhythm: Normal rate and regular  rhythm.     Heart sounds: Normal heart sounds. No murmur heard. Pulmonary:     Effort: Pulmonary effort is normal. No respiratory distress.     Breath sounds: Normal breath sounds. No wheezing.  Musculoskeletal:        General: No swelling. Normal range of motion.     Cervical back: Neck supple.  Lymphadenopathy:     Cervical: No cervical adenopathy.  Skin:    General: Skin is warm and dry.     Findings: No rash.  Neurological:     Mental Status: He is alert and oriented to person, place, and time.     Coordination: Coordination normal.  Psychiatric:        Behavior: Behavior normal.       Assessment & Plan:   Problem List Items Addressed This Visit       Genitourinary   OAB (overactive bladder) - Primary   Relevant Orders   CBC with Differential/Platelet   CMP14+EGFR   BPH (benign prostatic hyperplasia)   Relevant Orders   PSA, total and free     Other   Mixed hyperlipidemia   Relevant Orders   CMP14+EGFR   Lipid panel   Anxiety and depression   Relevant Medications   PARoxetine (PAXIL) 40 MG tablet   Other Relevant Orders   CBC with Differential/Platelet   CMP14+EGFR    Continue current medicine, no changes.  Will increase Paxil for mood other than that no changes.  He is to follow-up with urology for prostate. Follow up plan: Return in about 6 months (around 12/16/2022), or if symptoms worsen or fail to improve, for Hyperlipidemia and anxiety and OAB.Marland Kitchen  Counseling provided for all of the vaccine components Orders Placed This Encounter  Procedures   CBC with Differential/Platelet   CMP14+EGFR   Lipid panel   PSA, total and free    Caryl Pina, MD [facility] 06/17/2022, 3:27 PM

## 2022-06-18 LAB — CBC WITH DIFFERENTIAL/PLATELET
Basophils Absolute: 0 10*3/uL (ref 0.0–0.2)
Basos: 1 %
EOS (ABSOLUTE): 0.1 10*3/uL (ref 0.0–0.4)
Eos: 2 %
Hematocrit: 38.9 % (ref 37.5–51.0)
Hemoglobin: 13.2 g/dL (ref 13.0–17.7)
Immature Grans (Abs): 0 10*3/uL (ref 0.0–0.1)
Immature Granulocytes: 0 %
Lymphocytes Absolute: 1 10*3/uL (ref 0.7–3.1)
Lymphs: 23 %
MCH: 31.9 pg (ref 26.6–33.0)
MCHC: 33.9 g/dL (ref 31.5–35.7)
MCV: 94 fL (ref 79–97)
Monocytes Absolute: 0.5 10*3/uL (ref 0.1–0.9)
Monocytes: 12 %
Neutrophils Absolute: 2.6 10*3/uL (ref 1.4–7.0)
Neutrophils: 62 %
Platelets: 231 10*3/uL (ref 150–450)
RBC: 4.14 x10E6/uL (ref 4.14–5.80)
RDW: 12 % (ref 11.6–15.4)
WBC: 4.2 10*3/uL (ref 3.4–10.8)

## 2022-06-18 LAB — CMP14+EGFR
ALT: 16 IU/L (ref 0–44)
AST: 18 IU/L (ref 0–40)
Albumin/Globulin Ratio: 1.8 (ref 1.2–2.2)
Albumin: 4.1 g/dL (ref 3.7–4.7)
Alkaline Phosphatase: 101 IU/L (ref 44–121)
BUN/Creatinine Ratio: 9 — ABNORMAL LOW (ref 10–24)
BUN: 15 mg/dL (ref 8–27)
Bilirubin Total: 0.4 mg/dL (ref 0.0–1.2)
CO2: 25 mmol/L (ref 20–29)
Calcium: 9.1 mg/dL (ref 8.6–10.2)
Chloride: 105 mmol/L (ref 96–106)
Creatinine, Ser: 1.61 mg/dL — ABNORMAL HIGH (ref 0.76–1.27)
Globulin, Total: 2.3 g/dL (ref 1.5–4.5)
Glucose: 105 mg/dL — ABNORMAL HIGH (ref 70–99)
Potassium: 4.5 mmol/L (ref 3.5–5.2)
Sodium: 141 mmol/L (ref 134–144)
Total Protein: 6.4 g/dL (ref 6.0–8.5)
eGFR: 42 mL/min/{1.73_m2} — ABNORMAL LOW (ref >59)

## 2022-06-18 LAB — LIPID PANEL
Chol/HDL Ratio: 4.1 ratio (ref 0.0–5.0)
Cholesterol, Total: 170 mg/dL (ref 100–199)
HDL: 41 mg/dL (ref >39)
LDL Chol Calc (NIH): 111 mg/dL — ABNORMAL HIGH (ref 0–99)
Triglycerides: 95 mg/dL (ref 0–149)
VLDL Cholesterol Cal: 18 mg/dL (ref 5–40)

## 2022-06-18 LAB — PSA, TOTAL AND FREE
PSA, Free Pct: 38.2 %
PSA, Free: 0.65 ng/mL
Prostate Specific Ag, Serum: 1.7 ng/mL (ref 0.0–4.0)

## 2022-07-23 ENCOUNTER — Ambulatory Visit (INDEPENDENT_AMBULATORY_CARE_PROVIDER_SITE_OTHER): Payer: Medicare HMO

## 2022-07-23 VITALS — Ht 70.0 in | Wt 182.0 lb

## 2022-07-23 DIAGNOSIS — Z0000 Encounter for general adult medical examination without abnormal findings: Secondary | ICD-10-CM | POA: Diagnosis not present

## 2022-07-23 NOTE — Progress Notes (Signed)
Subjective:   Terry Sloan is a 82 y.o. male who presents for Medicare Annual/Subsequent preventive examination. I connected with  Areta Haber on 07/23/22 by a audio enabled telemedicine application and verified that I am speaking with the correct person using two identifiers.  Patient Location: Home  Provider Location: Home Office  I discussed the limitations of evaluation and management by telemedicine. The patient expressed understanding and agreed to proceed.  Review of Systems     Cardiac Risk Factors include: advanced age (>78mn, >>52women);male gender;hypertension     Objective:    Today's Vitals   07/23/22 1445  Weight: 182 lb (82.6 kg)  Height: '5\' 10"'$  (1.778 m)   Body mass index is 26.11 kg/m.     07/23/2022    2:48 PM 11/20/2021    7:22 PM 07/20/2021    3:38 PM 02/07/2017    8:30 AM  Advanced Directives  Does Patient Have a Medical Advance Directive? Yes Yes Yes No  Type of AParamedicof [place] will  [place] will   Copy of HSundownin Chart? No - copy requested  No - copy requested   Would patient like information on creating a medical advance directive?    Yes (MAU/Ambulatory/Procedural Areas - Information given)    Current Medications (verified) Outpatient Encounter Medications as of 07/23/2022  Medication Sig   naproxen sodium (ALEVE) 220 MG tablet Take 220 mg by mouth.   oxybutynin (DITROPAN) 5 MG tablet TAKE ONE (1) TABLET EACH DAY   PARoxetine (PAXIL) 40 MG tablet Take 1 tablet (40 mg total) by mouth every morning.   pravastatin (PRAVACHOL) 40 MG tablet TAKE ONE (1) TABLET BY MOUTH EVERY DAY   tamsulosin (FLOMAX) 0.4 MG CAPS capsule Take 1 capsule (0.4 mg total) by mouth daily after supper.   No facility-administered encounter medications on file as of 07/23/2022.    Allergies (verified) Celebrex [celecoxib]   History: Past Medical History:  Diagnosis Date   Arthritis     GERD (gastroesophageal reflux disease)    [name]   History of kidney stones    Hyperlipidemia    Macular degeneration    PVD (peripheral vascular disease) (HCC)    Stroke (HCC)    TIA     Past Surgical History:  Procedure Laterality Date   Bone turmor Left    foreheah   EYE SURGERY Bilateral    cataract removal   FRACTURE SURGERY Left    leg   pins   KNEE ARTHROPLASTY     KNEE SURGERY Left    NECK SURGERY     Tumor reomoved from head/neck in the 60s   REPLACEMENT TOTAL KNEE Left 07/14/2018   ROTATOR CUFF REPAIR Right    times 2   TOTAL KNEE ARTHROPLASTY WITH HARDWARE REMOVAL Left 02/14/2017   Procedure: Removal Left Tibia Nail, Cemented Total Knee Arthroplasty;  Surgeon: YMarybelle Killings MD;  [place]  Service: Orthopedics;  Laterality: Left;   Family History  Problem Relation Age of Onset   Stroke Neg Hx    Social History   Socioeconomic History   Marital status: DSoil scientist   Spouse name: Not on file   Number of children: 9   Years of education: Not on file   Highest education level: Not on file  Occupational History   Occupation: Retired  Tobacco Use   Smoking status: Former    Packs/day: 1.50    Years:  20.00    Total pack years: 30.00    Types: Cigarettes   Smokeless tobacco: Never   Tobacco comments:    Quit 20+ year ago  Vaping Use   Vaping Use: Never used  Substance and Sexual Activity   Alcohol use: Yes    Comment: Occasional beer   Drug use: No   Sexual activity: Not on file  Other Topics Concern   Not on file  Social History Narrative   Lives at home w/ his significant other   Right-handed   Caffeine: some coffee, 3 diet Mt Dew's per day   Social Determinants of Health   Financial Resource Strain: Low Risk  (07/23/2022)   Overall Financial Resource Strain (CARDIA)    Difficulty of Paying Living Expenses: Not hard at all  Food Insecurity: No Food Insecurity (07/23/2022)   Hunger Vital Sign    Worried About Running Out of  Food in the Last Year: Never true    Ran Out of Food in the Last Year: Never true  Transportation Needs: No Transportation Needs (07/23/2022)   PRAPARE - Hydrologist (Medical): No    Lack of Transportation (Non-Medical): No  Physical Activity: Insufficiently Active (07/23/2022)   Exercise Vital Sign    Days of Exercise per Week: 3 days    Minutes of Exercise per Session: 30 min  Stress: No Stress Concern Present (07/23/2022)   [place]    Feeling of Stress : Not at all  Social Connections: Socially Isolated (07/23/2022)   Social Connection and Isolation Panel [NHANES]    Frequency of Communication with Friends and Family: More than three times a week    Frequency of Social Gatherings with Friends and Family: Never    Attends Religious Services: Never    Marine scientist or Organizations: No    Attends Archivist Meetings: Never    Marital Status: Widowed    Tobacco Counseling Counseling given: Not Answered Tobacco comments: Quit 20+ year ago   Clinical Intake:  Pre-visit preparation completed: Yes  Pain : No/denies pain     Nutritional Risks: None Diabetes: No  How often do you need to have someone help you when you read instructions, pamphlets, or other written materials from your doctor or pharmacy?: 1 - Never  Diabetic?no  Interpreter Needed?: No  Information entered by :: Jadene Pierini, LPN   Activities of Daily Living    07/23/2022    2:48 PM  In your present state of health, do you have any difficulty performing the following activities:  Hearing? 0  Vision? 0  Difficulty concentrating or making decisions? 0  Walking or climbing stairs? 0  Dressing or bathing? 0  Doing errands, shopping? 0  Preparing Food and eating ? N  Using the Toilet? N  In the past six months, have you accidently leaked urine? N  Do you have problems with loss of bowel  control? N  Managing your Medications? N  Managing your Finances? N  Housekeeping or managing your Housekeeping? N    Patient Care Team: Dettinger, Fransisca Kaufmann, MD as PCP - General (Family Medicine)  Indicate any recent Medical Services you may have received from other than [facility] providers in the past year (date may be approximate).     Assessment:   This is a routine wellness examination for [facility].  Hearing/Vision screen Vision Screening - Comments:: Wears rx glasses - up to date with  routine eye exams with  Dr.Lee  Dietary issues and exercise activities discussed: Current Exercise Habits: Home exercise routine, Type of exercise: walking, Time (Minutes): 30, Frequency (Times/Week): 3, Weekly Exercise (Minutes/Week): 90, Intensity: Mild, Exercise limited by: None identified   Goals Addressed             This Visit's Progress    DIET - INCREASE WATER INTAKE         Depression Screen    07/23/2022    2:47 PM 06/17/2022    3:13 PM 05/13/2022    4:06 PM 12/10/2021   10:34 AM 07/20/2021    4:04 PM 04/20/2021    4:00 PM 01/28/2020    3:35 PM  PHQ 2/9 Scores  PHQ - 2 Score 0 2 0 0 0 0 2  PHQ- 9 Score 0 6 0   0 9    Fall Risk    07/23/2022    2:46 PM 06/17/2022    3:16 PM 05/13/2022    4:06 PM 10/22/2021    3:46 PM 07/20/2021    3:28 PM  [place] in the past year? '1 1 1 1 1  '$ Number falls in past yr: '1 1 1 1 1  '$ Injury with Fall? 1 0  0 1  Risk for fall due to : History of fall(s);Impaired balance/gait;Orthopedic patient Impaired balance/gait;History of fall(s) Impaired balance/gait;History of fall(s) Impaired balance/gait History of fall(s);Impaired balance/gait;Orthopedic patient  Follow up Education provided;Falls prevention discussed Falls evaluation completed Falls evaluation completed Falls evaluation completed Education provided;Falls prevention discussed    FALL RISK PREVENTION PERTAINING TO THE HOME:  Any stairs in or around the home? Yes  If so, are there  any without handrails? No  Home free of loose throw rugs in walkways, pet beds, electrical cords, etc? Yes  Adequate lighting in your home to reduce risk of falls? Yes   ASSISTIVE DEVICES UTILIZED TO PREVENT FALLS:  Life alert? No  Use of a cane, walker or w/c? Yes  Grab bars in the bathroom? Yes  Shower chair or bench in shower? Yes  Elevated toilet seat or a handicapped toilet? Yes       02/23/2021    4:18 PM  MMSE - Mini Mental State Exam  Orientation to time 2  Orientation to Place 4  Registration 3  Attention/ Calculation 0  Recall 0  Language- name 2 objects 2  Language- repeat 0  Language- follow 3 step command 3  Language- read & follow direction 1  Write a sentence 0  Copy design 0  Total score 15        07/23/2022    2:49 PM 07/20/2021    3:38 PM  6CIT Screen  What Year? 0 points 0 points  What month? 3 points 0 points  What time? 0 points 0 points  Count back from 20 0 points 0 points  Months in reverse 2 points 4 points  Repeat phrase 4 points 10 points  Total Score 9 points 14 points    Immunizations Immunization History  Administered Date(s) Administered   Pneumococcal Conjugate-13 07/30/2019   Pneumococcal Polysaccharide-23 07/15/2018   Tdap 01/25/2019   Zoster Recombinat (Shingrix) 10/22/2021, 06/17/2022    TDAP status: Up to date  Flu Vaccine status: Up to date  Pneumococcal vaccine status: Up to date  Covid-19 vaccine status: Completed vaccines  Qualifies for Shingles Vaccine? Yes   Zostavax completed Yes   Shingrix Completed?: Yes  Screening Tests Health Maintenance  Topic Date Due   INFLUENZA VACCINE  09/01/2022 (Originally 01/01/2022)   Medicare Annual Wellness (AWV)  07/24/2023   DTaP/Tdap/Td (2 - Td or Tdap) 01/24/2029   Pneumonia Vaccine 50+ Years old  Completed   Zoster Vaccines- Shingrix  Completed   HPV VACCINES  Aged Out   COVID-19 Vaccine  Discontinued    Health Maintenance  There are no preventive care reminders  to display for this patient.   Colorectal cancer screening: No longer required.   Lung Cancer Screening: (Low Dose CT Chest recommended if Age 86-80 years, 30 pack-year currently smoking OR have quit w/in 15years.) does not qualify.   Lung Cancer Screening Referral: n/a  Additional Screening:  Hepatitis C Screening: does not qualify;   Vision Screening: Recommended annual ophthalmology exams for early detection of glaucoma and other disorders of the eye. Is the patient up to date with their annual eye exam?  Yes  Who is the provider or what is the name of the office in which the patient attends annual eye exams? Dr.Lee  If pt is not established with a provider, would they like to be referred to a provider to establish care? No .   Dental Screening: Recommended annual dental exams for proper oral hygiene  Community Resource Referral / Chronic Care Management: CRR required this visit?  No   CCM required this visit?  No      Plan:     I have personally reviewed and noted the following in the patient's chart:   Medical and social history Use of alcohol, tobacco or illicit drugs  Current medications and supplements including opioid prescriptions. Patient is not currently taking opioid prescriptions. Functional ability and status Nutritional status Physical activity Advanced directives List of other physicians Hospitalizations, surgeries, and ER visits in previous 12 months Vitals Screenings to include cognitive, depression, and falls Referrals and appointments  In addition, I have reviewed and discussed with patient certain preventive protocols, quality metrics, and best practice recommendations. A written personalized care plan for preventive services as well as general preventive health recommendations were provided to patient.     Daphane Shepherd, LPN   [id number]   Nurse Notes: none

## 2022-07-23 NOTE — Patient Instructions (Signed)
Terry Sloan , Thank you for taking time to come for your Medicare Wellness Visit. I appreciate your ongoing commitment to your health goals. Please review the following plan we discussed and let me know if I can assist you in the future.   These are the goals we discussed:  Goals      DIET - INCREASE WATER INTAKE     LIFESTYLE - DECREASE FALLS RISK        This is a list of the screening recommended for you and due dates:  Health Maintenance  Topic Date Due   Flu Shot  09/01/2022*   Medicare Annual Wellness Visit  07/24/2023   DTaP/Tdap/Td vaccine (2 - Td or Tdap) 01/24/2029   Pneumonia Vaccine  Completed   Zoster (Shingles) Vaccine  Completed   HPV Vaccine  Aged Out   COVID-19 Vaccine  Discontinued  *Topic was postponed. The date shown is not the original due date.    Advanced directives: Please bring a copy of your health care power of attorney and living will to the office to be added to your chart at your convenience.   Conditions/risks identified: Aim for 30 minutes of exercise or brisk walking, 6-8 glasses of water, and 5 servings of fruits and vegetables each day.   Next appointment: Follow up in one year for your annual wellness visit.   Preventive Care 62 Years and Older, Male  Preventive care refers to lifestyle choices and visits with your health care provider that can promote health and wellness. What does preventive care include? A yearly physical exam. This is also called an annual well check. Dental exams once or twice a year. Routine eye exams. Ask your health care provider how often you should have your eyes checked. Personal lifestyle choices, including: Daily care of your teeth and gums. Regular physical activity. Eating a healthy diet. Avoiding tobacco and drug use. Limiting alcohol use. Practicing safe sex. Taking low doses of aspirin every day. Taking vitamin and mineral supplements as recommended by your health care provider. What happens during an  annual well check? The services and screenings done by your health care provider during your annual well check will depend on your age, overall health, lifestyle risk factors, and family history of disease. Counseling  Your health care provider may ask you questions about your: Alcohol use. Tobacco use. Drug use. Emotional well-being. Home and relationship well-being. Sexual activity. Eating habits. History of falls. Memory and ability to understand (cognition). Work and work Statistician. Screening  You may have the following tests or measurements: Height, weight, and BMI. Blood pressure. Lipid and cholesterol levels. These may be checked every 5 years, or more frequently if you are over 1 years old. Skin check. Lung cancer screening. You may have this screening every year starting at age 32 if you have a 30-pack-year history of smoking and currently smoke or have quit within the past 15 years. Fecal occult blood test (FOBT) of the stool. You may have this test every year starting at age 51. Flexible sigmoidoscopy or colonoscopy. You may have a sigmoidoscopy every 5 years or a colonoscopy every 10 years starting at age 74. Prostate cancer screening. Recommendations will vary depending on your family history and other risks. Hepatitis C blood test. Hepatitis B blood test. Sexually transmitted disease (STD) testing. Diabetes screening. This is done by checking your blood sugar (glucose) after you have not eaten for a while (fasting). You may have this done every 83 years. Abdominal aortic aneurysm (  AAA) screening. You may need this if you are a current or former smoker. Osteoporosis. You may be screened starting at age 83 if you are at high risk. Talk with your health care provider about your test results, treatment options, and if necessary, the need for more tests. Vaccines  Your health care provider may recommend certain vaccines, such as: Influenza vaccine. This is recommended  every year. Tetanus, diphtheria, and acellular pertussis (Tdap, Td) vaccine. You may need a Td booster every 10 years. Zoster vaccine. You may need this after age 83. Pneumococcal 13-valent conjugate (PCV13) vaccine. One dose is recommended after age 83. Pneumococcal polysaccharide (PPSV23) vaccine. One dose is recommended after age 83. Talk to your health care provider about which screenings and vaccines you need and how often you need them. This information is not intended to replace advice given to you by your health care provider. Make sure you discuss any questions you have with your health care provider. Document Released: 06/16/2015 Document Revised: 02/07/2016 Document Reviewed: 03/21/2015 Elsevier Interactive Patient Education  2017 [place] Prevention in the Home Falls can cause injuries. They can happen to people of all ages. There are many things you can do to make your home safe and to help prevent falls. What can I do on the outside of my home? Regularly fix the edges of walkways and driveways and fix any cracks. Remove anything that might make you trip as you walk through a door, such as a raised step or threshold. Trim any bushes or trees on the path to your home. Use bright outdoor lighting. Clear any walking paths of anything that might make someone trip, such as rocks or tools. Regularly check to see if handrails are loose or broken. Make sure that both sides of any steps have handrails. Any raised decks and porches should have guardrails on the edges. Have any leaves, snow, or ice cleared regularly. Use sand or salt on walking paths during winter. Clean up any spills in your garage right away. This includes oil or grease spills. What can I do in the bathroom? Use night lights. Install grab bars by the toilet and in the tub and shower. Do not use towel bars as grab bars. Use non-skid mats or decals in the tub or shower. If you need to sit down in the shower,  use a plastic, non-slip stool. Keep the floor dry. Clean up any water that spills on the floor as soon as it happens. Remove soap buildup in the tub or shower regularly. Attach bath mats securely with double-sided non-slip rug tape. Do not have throw rugs and other things on the floor that can make you trip. What can I do in the bedroom? Use night lights. Make sure that you have a light by your bed that is easy to reach. Do not use any sheets or blankets that are too big for your bed. They should not hang down onto the floor. Have a firm chair that has side arms. You can use this for support while you get dressed. Do not have throw rugs and other things on the floor that can make you trip. What can I do in the kitchen? Clean up any spills right away. Avoid walking on wet floors. Keep items that you use a lot in easy-to-reach places. If you need to reach something above you, use a strong step stool that has a grab bar. Keep electrical cords out of the way. Do not use floor polish  or wax that makes floors slippery. If you must use wax, use non-skid floor wax. Do not have throw rugs and other things on the floor that can make you trip. What can I do with my stairs? Do not leave any items on the stairs. Make sure that there are handrails on both sides of the stairs and use them. Fix handrails that are broken or loose. Make sure that handrails are as long as the stairways. Check any carpeting to make sure that it is firmly attached to the stairs. Fix any carpet that is loose or worn. Avoid having throw rugs at the top or bottom of the stairs. If you do have throw rugs, attach them to the floor with carpet tape. Make sure that you have a light switch at the top of the stairs and the bottom of the stairs. If you do not have them, ask someone to add them for you. What else can I do to help prevent falls? Wear shoes that: Do not have high heels. Have rubber bottoms. Are comfortable and fit you  well. Are closed at the toe. Do not wear sandals. If you use a stepladder: Make sure that it is fully opened. Do not climb a closed stepladder. Make sure that both sides of the stepladder are locked into place. Ask someone to hold it for you, if possible. Clearly mark and make sure that you can see: Any grab bars or handrails. First and last steps. Where the edge of each step is. Use tools that help you move around (mobility aids) if they are needed. These include: Canes. Walkers. Scooters. Crutches. Turn on the lights when you go into a dark area. Replace any light bulbs as soon as they burn out. Set up your furniture so you have a clear path. Avoid moving your furniture around. If any of your floors are uneven, fix them. If there are any pets around you, be aware of where they are. Review your medicines with your doctor. Some medicines can make you feel dizzy. This can increase your chance of falling. Ask your doctor what other things that you can do to help prevent falls. This information is not intended to replace advice given to you by your health care provider. Make sure you discuss any questions you have with your health care provider. Document Released: 03/16/2009 Document Revised: 10/26/2015 Document Reviewed: 06/24/2014 Elsevier Interactive Patient Education  2017 [employer].

## 2022-07-26 ENCOUNTER — Ambulatory Visit (INDEPENDENT_AMBULATORY_CARE_PROVIDER_SITE_OTHER): Payer: Medicare HMO | Admitting: Family Medicine

## 2022-07-26 ENCOUNTER — Encounter: Payer: Self-pay | Admitting: Family Medicine

## 2022-07-26 VITALS — BP 139/80 | HR 84 | Ht 70.0 in

## 2022-07-26 DIAGNOSIS — F03918 Unspecified dementia, unspecified severity, with other behavioral disturbance: Secondary | ICD-10-CM | POA: Diagnosis not present

## 2022-07-26 DIAGNOSIS — R399 Unspecified symptoms and signs involving the genitourinary system: Secondary | ICD-10-CM | POA: Diagnosis not present

## 2022-07-26 DIAGNOSIS — R410 Disorientation, unspecified: Secondary | ICD-10-CM

## 2022-07-26 LAB — URINALYSIS, ROUTINE W REFLEX MICROSCOPIC
Bilirubin, UA: NEGATIVE
Glucose, UA: NEGATIVE
Ketones, UA: NEGATIVE
Leukocytes,UA: NEGATIVE
Nitrite, UA: NEGATIVE
Protein,UA: NEGATIVE
RBC, UA: NEGATIVE
Specific Gravity, UA: 1.02 (ref 1.005–1.030)
Urobilinogen, Ur: 0.2 mg/dL (ref 0.2–1.0)
pH, UA: 6 (ref 5.0–7.5)

## 2022-07-26 MED ORDER — HYDROXYZINE PAMOATE 25 MG PO CAPS: 25.0000 mg | ORAL_CAPSULE | Freq: Three times a day (TID) | ORAL | 2 refills | Status: DC | PRN
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
## 2022-07-27 ENCOUNTER — Encounter (HOSPITAL_COMMUNITY): Payer: Self-pay

## 2022-07-27 ENCOUNTER — Emergency Department (HOSPITAL_COMMUNITY)
Admission: EM | Admit: 2022-07-27 | Discharge: 2022-08-02 | Disposition: A | Discharge status: Home / Self Care | Payer: Medicare HMO | Attending: Emergency Medicine | Admitting: Emergency Medicine

## 2022-07-27 ENCOUNTER — Emergency Department (HOSPITAL_COMMUNITY): Payer: Medicare HMO

## 2022-07-27 ENCOUNTER — Other Ambulatory Visit: Payer: Self-pay

## 2022-07-27 DIAGNOSIS — M6281 Muscle weakness (generalized): Secondary | ICD-10-CM | POA: Insufficient documentation

## 2022-07-27 DIAGNOSIS — M40203 Unspecified kyphosis, cervicothoracic region: Secondary | ICD-10-CM | POA: Diagnosis not present

## 2022-07-27 DIAGNOSIS — R2681 Unsteadiness on feet: Secondary | ICD-10-CM | POA: Insufficient documentation

## 2022-07-27 DIAGNOSIS — R456 Violent behavior: Secondary | ICD-10-CM | POA: Diagnosis not present

## 2022-07-27 DIAGNOSIS — R5383 Other fatigue: Secondary | ICD-10-CM | POA: Insufficient documentation

## 2022-07-27 DIAGNOSIS — Z1152 Encounter for screening for COVID-19: Secondary | ICD-10-CM | POA: Insufficient documentation

## 2022-07-27 DIAGNOSIS — R2689 Other abnormalities of gait and mobility: Secondary | ICD-10-CM | POA: Insufficient documentation

## 2022-07-27 DIAGNOSIS — Z7409 Other reduced mobility: Secondary | ICD-10-CM | POA: Insufficient documentation

## 2022-07-27 DIAGNOSIS — F0390 Unspecified dementia without behavioral disturbance: Secondary | ICD-10-CM | POA: Diagnosis not present

## 2022-07-27 DIAGNOSIS — R4182 Altered mental status, unspecified: Secondary | ICD-10-CM | POA: Insufficient documentation

## 2022-07-27 DIAGNOSIS — F03918 Unspecified dementia, unspecified severity, with other behavioral disturbance: Secondary | ICD-10-CM | POA: Diagnosis not present

## 2022-07-27 DIAGNOSIS — R9431 Abnormal electrocardiogram [ECG] [EKG]: Secondary | ICD-10-CM | POA: Diagnosis not present

## 2022-07-27 HISTORY — DX: Unspecified dementia, unspecified severity, without behavioral disturbance, psychotic disturbance, mood disturbance, and anxiety: F03.90

## 2022-07-27 LAB — CBC WITH DIFFERENTIAL/PLATELET
Abs Immature Granulocytes: 0.02 10*3/uL (ref 0.00–0.07)
Basophils Absolute: 0 10*3/uL (ref 0.0–0.2)
Basophils Absolute: 0 10*3/uL (ref 0.0–0.1)
Basophils Relative: 1 %
Basos: 1 %
EOS (ABSOLUTE): 0.1 10*3/uL (ref 0.0–0.4)
Eos: 2 %
Eosinophils Absolute: 0.1 10*3/uL (ref 0.0–0.5)
Eosinophils Relative: 1 %
HCT: 39.3 % (ref 39.0–52.0)
Hematocrit: 39.9 % (ref 37.5–51.0)
Hemoglobin: 13.3 g/dL (ref 13.0–17.7)
Hemoglobin: 12.8 g/dL — ABNORMAL LOW (ref 13.0–17.0)
Immature Grans (Abs): 0 10*3/uL (ref 0.0–0.1)
Immature Granulocytes: 0 %
Immature Granulocytes: 0 %
Lymphocytes Absolute: 1.1 10*3/uL (ref 0.7–3.1)
Lymphocytes Relative: 11 %
Lymphs Abs: 0.7 10*3/uL (ref 0.7–4.0)
Lymphs: 23 %
MCH: 31.3 pg (ref 26.6–33.0)
MCH: 31.5 pg (ref 26.0–34.0)
MCHC: 33.3 g/dL (ref 31.5–35.7)
MCHC: 32.6 g/dL (ref 30.0–36.0)
MCV: 94 fL (ref 79–97)
MCV: 96.8 fL (ref 80.0–100.0)
Monocytes Absolute: 0.5 10*3/uL (ref 0.1–0.9)
Monocytes Absolute: 0.6 10*3/uL (ref 0.1–1.0)
Monocytes Relative: 11 %
Monocytes: 11 %
Neutro Abs: 4.5 10*3/uL (ref 1.7–7.7)
Neutrophils Absolute: 3 10*3/uL (ref 1.4–7.0)
Neutrophils Relative %: 76 %
Neutrophils: 63 %
Platelets: 232 10*3/uL (ref 150–450)
Platelets: 212 10*3/uL (ref 150–400)
RBC: 4.25 x10E6/uL (ref 4.14–5.80)
RBC: 4.06 MIL/uL — ABNORMAL LOW (ref 4.22–5.81)
RDW: 12.3 % (ref 11.6–15.4)
RDW: 12.5 % (ref 11.5–15.5)
WBC: 4.8 10*3/uL (ref 3.4–10.8)
WBC: 5.9 10*3/uL (ref 4.0–10.5)
nRBC: 0 % (ref 0.0–0.2)

## 2022-07-27 LAB — CMP14+EGFR
ALT: 19 IU/L (ref 0–44)
AST: 29 IU/L (ref 0–40)
Albumin/Globulin Ratio: 1.8 (ref 1.2–2.2)
Albumin: 4.4 g/dL (ref 3.7–4.7)
Alkaline Phosphatase: 79 IU/L (ref 44–121)
BUN/Creatinine Ratio: 17 (ref 10–24)
BUN: 22 mg/dL (ref 8–27)
Bilirubin Total: 0.7 mg/dL (ref 0.0–1.2)
CO2: 22 mmol/L (ref 20–29)
Calcium: 9.4 mg/dL (ref 8.6–10.2)
Chloride: 103 mmol/L (ref 96–106)
Creatinine, Ser: 1.31 mg/dL — ABNORMAL HIGH (ref 0.76–1.27)
Globulin, Total: 2.5 g/dL (ref 1.5–4.5)
Glucose: 104 mg/dL — ABNORMAL HIGH (ref 70–99)
Potassium: 4.4 mmol/L (ref 3.5–5.2)
Sodium: 139 mmol/L (ref 134–144)
Total Protein: 6.9 g/dL (ref 6.0–8.5)
eGFR: 54 mL/min/{1.73_m2} — ABNORMAL LOW (ref >59)

## 2022-07-27 LAB — RESP PANEL BY RT-PCR (RSV, FLU A&B, COVID)  RVPGX2
Influenza A by PCR: NEGATIVE
Influenza B by PCR: NEGATIVE
Resp Syncytial Virus by PCR: NEGATIVE
SARS Coronavirus 2 by RT PCR: NEGATIVE

## 2022-07-27 LAB — RAPID URINE DRUG SCREEN, HOSP PERFORMED
Amphetamines: NOT DETECTED
Barbiturates: NOT DETECTED
Benzodiazepines: NOT DETECTED
Cocaine: NOT DETECTED
Opiates: NOT DETECTED
Tetrahydrocannabinol: NOT DETECTED

## 2022-07-27 LAB — URINALYSIS, ROUTINE W REFLEX MICROSCOPIC
Bilirubin Urine: NEGATIVE
Glucose, UA: NEGATIVE mg/dL
Hgb urine dipstick: NEGATIVE
Ketones, ur: NEGATIVE mg/dL
Leukocytes,Ua: NEGATIVE
Nitrite: NEGATIVE
Protein, ur: NEGATIVE mg/dL
Specific Gravity, Urine: 1.019 (ref 1.005–1.030)
pH: 6 (ref 5.0–8.0)

## 2022-07-27 LAB — COMPREHENSIVE METABOLIC PANEL
ALT: 22 U/L (ref 0–44)
AST: 29 U/L (ref 15–41)
Albumin: 3.9 g/dL (ref 3.5–5.0)
Alkaline Phosphatase: 63 U/L (ref 38–126)
Anion gap: 10 (ref 5–15)
BUN: 26 mg/dL — ABNORMAL HIGH (ref 8–23)
CO2: 24 mmol/L (ref 22–32)
Calcium: 9.3 mg/dL (ref 8.9–10.3)
Chloride: 106 mmol/L (ref 98–111)
Creatinine, Ser: 1.46 mg/dL — ABNORMAL HIGH (ref 0.61–1.24)
GFR, Estimated: 47 mL/min — ABNORMAL LOW (ref >60)
Glucose, Bld: 100 mg/dL — ABNORMAL HIGH (ref 70–99)
Potassium: 4.4 mmol/L (ref 3.5–5.1)
Sodium: 140 mmol/L (ref 135–145)
Total Bilirubin: 1 mg/dL (ref 0.3–1.2)
Total Protein: 6.6 g/dL (ref 6.5–8.1)

## 2022-07-27 MED ORDER — ZIPRASIDONE MESYLATE 20 MG IM SOLR
10.0000 mg | Freq: Once | INTRAMUSCULAR | Status: AC
  Administered 2022-07-27: 10 mg via INTRAMUSCULAR
  Filled 2022-07-27: qty 20

## 2022-07-27 MED ORDER — STERILE WATER FOR INJECTION IJ SOLN
INTRAMUSCULAR | Status: AC
  Filled 2022-07-27: qty 10

## 2022-07-27 MED ORDER — LORAZEPAM 2 MG/ML IJ SOLN
1.0000 mg | Freq: Once | INTRAMUSCULAR | Status: AC
  Administered 2022-07-27: 1 mg via INTRAMUSCULAR
  Filled 2022-07-27: qty 1

## 2022-07-27 MED ORDER — ZIPRASIDONE MESYLATE 20 MG IM SOLR
20.0000 mg | Freq: Once | INTRAMUSCULAR | Status: AC
  Administered 2022-07-27: 20 mg via INTRAMUSCULAR
  Filled 2022-07-27: qty 20

## 2022-07-27 MED ORDER — PRAVASTATIN SODIUM 10 MG PO TABS
10.0000 mg | ORAL_TABLET | Freq: Every day | ORAL | Status: DC
  Administered 2022-07-27 – 2022-08-02 (×7): 10 mg via ORAL
  Filled 2022-07-27 (×7): qty 1

## 2022-07-27 MED ORDER — LORAZEPAM 1 MG PO TABS
1.0000 mg | ORAL_TABLET | Freq: Once | ORAL | Status: AC
  Administered 2022-07-27: 1 mg via ORAL
  Filled 2022-07-27: qty 1

## 2022-07-27 MED ORDER — HALOPERIDOL LACTATE 5 MG/ML IJ SOLN
5.0000 mg | Freq: Once | INTRAMUSCULAR | Status: AC
  Administered 2022-07-27: 5 mg via INTRAMUSCULAR
  Filled 2022-07-27: qty 1

## 2022-07-27 MED ORDER — QUETIAPINE FUMARATE 25 MG PO TABS
25.0000 mg | ORAL_TABLET | Freq: Every day | ORAL | Status: DC
  Filled 2022-07-27: qty 1

## 2022-07-27 MED ORDER — LORAZEPAM 1 MG PO TABS
1.0000 mg | ORAL_TABLET | Freq: Four times a day (QID) | ORAL | Status: DC | PRN
  Administered 2022-07-27 – 2022-08-02 (×8): 1 mg via ORAL
  Filled 2022-07-27 (×9): qty 1

## 2022-07-27 MED ORDER — TAMSULOSIN HCL 0.4 MG PO CAPS
0.4000 mg | ORAL_CAPSULE | Freq: Every day | ORAL | Status: DC
  Administered 2022-07-28 – 2022-08-02 (×6): 0.4 mg via ORAL
  Filled 2022-07-27 (×6): qty 1

## 2022-07-27 NOTE — Progress Notes (Signed)
Per Provider Beatriz Stallion, FNP pt has been psych cleared. TOC consult has been made for follow up. This CSW will now remove pt from the [facility] shift report. TOC to assist and follow with pt's discharge needs.   Benjaman Kindler, MSW, [facility] 07/27/2022 2:17 PM
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
## 2022-07-27 NOTE — ED Notes (Signed)
Pt continues to yell out " mama". Redirection not helpful.

## 2022-07-27 NOTE — ED Notes (Signed)
Pt restless but calm, redirection helpful. Pt continues to walk around ED looking for things to do and places to go. Pt has attempted to go out [place] doors x 2 but it able to be redirected. No aggression shown.

## 2022-07-27 NOTE — ED Notes (Signed)
Patient increasing with agitation, fighting staff, kicking staff. Contacted doctor to get something else since he wasn't calming down.

## 2022-07-27 NOTE — ED Provider Notes (Signed)
[facility] Provider Note   CSN: [id number] Arrival date & time: 07/27/22  [id number]     History  Chief Complaint  Patient presents with   Aggressive Behavior   Dementia    Terry Sloan is a 82 y.o. male.  Patient has a history of dementia and GERD.  He has become aggressive at home and his daughter states she cannot take care of him anymore.  She wants him placed in a nursing home  The history is provided by the patient and medical records. No language interpreter was used.  Altered Mental Status Presenting symptoms: behavior changes   Severity:  Severe Most recent episode:  More than 2 days ago Episode history:  Continuous Timing:  Constant Progression:  Worsening Chronicity:  Recurrent Context: not alcohol use   Associated symptoms: no abdominal pain        Home Medications Prior to Admission medications   Medication Sig Start Date End Date Taking? Authorizing Provider  pravastatin (PRAVACHOL) 40 MG tablet TAKE ONE (1) TABLET BY MOUTH EVERY DAY 06/11/22   Dettinger, Fransisca Kaufmann, MD  tamsulosin (FLOMAX) 0.4 MG CAPS capsule Take 1 capsule (0.4 mg total) by mouth daily after supper. 09/17/21   McKenzie, Candee Furbish, MD      Allergies    Celebrex [celecoxib]    Review of Systems   Review of Systems  Unable to perform ROS: Dementia  Gastrointestinal:  Negative for abdominal pain.    Physical Exam Updated Vital Signs BP 125/72   Pulse 81   Temp 98.1 F (36.7 C) (Oral)   Resp 16   Ht '5\' 10"'$  (1.778 m)   Wt 81.1 kg   SpO2 100%   BMI 25.64 kg/m  Physical Exam Vitals and nursing note reviewed.  Constitutional:      Appearance: He is well-developed.  HENT:     Head: Normocephalic.     Nose: Nose normal.  Eyes:     General: No scleral icterus.    Conjunctiva/sclera: Conjunctivae normal.  Neck:     Thyroid: No thyromegaly.  Cardiovascular:     Rate and Rhythm: Normal rate and regular rhythm.     Heart sounds: No murmur  heard.    No friction rub. No gallop.  Pulmonary:     Breath sounds: No stridor. No wheezing or rales.  Chest:     Chest wall: No tenderness.  Abdominal:     General: There is no distension.     Tenderness: There is no abdominal tenderness. There is no rebound.  Musculoskeletal:        General: Normal range of motion.     Cervical back: Neck supple.  Lymphadenopathy:     Cervical: No cervical adenopathy.  Skin:    Findings: No erythema or rash.  Neurological:     Mental Status: He is alert.     Motor: No abnormal muscle tone.     Coordination: Coordination normal.     Comments: Patient is oriented to person only     ED Results / Procedures / Treatments   Labs (all labs ordered are listed, but only abnormal results are displayed) Labs Reviewed  CBC WITH DIFFERENTIAL/PLATELET - Abnormal; Notable for the following components:      Result Value   RBC 4.06 (*)    Hemoglobin 12.8 (*)    All other components within normal limits  COMPREHENSIVE METABOLIC PANEL - Abnormal; Notable for the following components:   Glucose, Bld  100 (*)    BUN 26 (*)    Creatinine, Ser 1.46 (*)    GFR, Estimated 47 (*)    All other components within normal limits  RESP PANEL BY RT-PCR (RSV, FLU A&B, COVID)  [id number]  URINALYSIS, ROUTINE W REFLEX MICROSCOPIC  RAPID URINE DRUG SCREEN, HOSP PERFORMED    EKG None  Radiology No results found.  Procedures Procedures    Medications Ordered in ED Medications  pravastatin (PRAVACHOL) tablet 10 mg (10 mg Oral Given 07/27/22 1304)  tamsulosin (FLOMAX) capsule 0.4 mg (has no administration in time range)  QUEtiapine (SEROQUEL) tablet 25 mg (has no administration in time range)  LORazepam (ATIVAN) tablet 1 mg (1 mg Oral Given 07/27/22 [id number])    ED Course/ Medical Decision Making/ A&P                             Medical Decision Making Amount and/or Complexity of Data Reviewed Labs: ordered. Radiology: ordered.  Risk Prescription drug  management.  This patient presents to the ED for concern of dementia, this involves an extensive number of treatment options, and is a complaint that carries with it a high risk of complications and morbidity.  The differential diagnosis includes vascular dementia   Co morbidities that complicate the patient evaluation  GERD   Additional history obtained:  Additional history obtained from daughter External records from outside source obtained and reviewed including full records   Lab Tests:  I Ordered, and personally interpreted labs.  The pertinent results include: CBC and chemistries unremarkable   Imaging Studies ordered:  I ordered imaging studies including CT head I independently visualized and interpreted imaging which showed no acute disease I agree with the radiologist interpretation   Cardiac Monitoring: / EKG:  The patient was maintained on a cardiac monitor.  I personally viewed and interpreted the cardiac monitored which showed an underlying rhythm of: Normal sinus rhythm   Consultations Obtained:  I requested consultation with the behavioral health and social worker,  and discussed lab and imaging findings as well as pertinent plan - they recommend: Behavioral health cleared the patient from psychiatric treatment.  This worker will arrange nursing home placement   Problem List / ED Course / Critical interventions / Medication management  GERD, dementia I ordered medication including Ativan for anxiety Reevaluation of the patient after these medicines showed that the patient stayed the same I have reviewed the patients home medicines and have made adjustments as needed   Social Determinants of Health:  Demented   Test / Admission - Considered:  None  Patient with significant dementia and aggressive behavior.  He has been seen by behavioral health and they are recommending no inpatient psych but placement to nursing home which is what his daughter  wants.        Final Clinical Impression(s) / ED Diagnoses Final diagnoses:  None    Rx / DC Orders ED Discharge Orders     None         [name], MD 07/27/22 1310

## 2022-07-27 NOTE — ED Notes (Signed)
Standing at side of bed. Redirected back to bed. Reluctantly cooperative. Pt updated. Given snack and drink.

## 2022-07-27 NOTE — ED Notes (Addendum)
Pt continues to be confused and very agitated. Hx of dementia. Pt continues to yell out about his mama and cursing constantly. Pt is fighting the restraints so hard he has removed the left soft wrist restraint once. Pt is very strong and has been able to loosen the wrist restraints enough to allow decent range of motion which helps him remove his clothing and attempt to throw himself over the stretcher rails. Nursing staff has continuously been trying to redirect pt, but all attempts have been unsuccessful. Pt has sitter at bedside. MD ordered additional medication to help promote relaxation. Geodon '20mg'$  and Ativan '1mg'$  IM administered to pt.

## 2022-07-27 NOTE — ED Notes (Signed)
Patient continued to fight with staff, kicking and screaming at staff. Requesting restraints.

## 2022-07-27 NOTE — ED Notes (Signed)
Patient very agitated, grabbing staff and would not stay in his room. Requested doctor to give ativan.

## 2022-07-27 NOTE — ED Notes (Signed)
Pt is aggressive and confused. Redirection not effective or helpful. Pt continues to talk about situations and people who are not around. 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 '

## 2022-07-27 NOTE — ED Notes (Addendum)
Pt alert, NAD, calm, interactive, resps e/u, speaking in clear complete sentences. Denies sx or complaints. Last ate last night. Last BM this am (normal). Urine sent. Lab at [facility]. States, "nothing is wrong, I feel normal, I want to be at home".

## 2022-07-27 NOTE — ED Notes (Signed)
Pt cursing staff and calling us "bitches" as we change his linens, brief and incontinent pad.

## 2022-07-27 NOTE — ED Notes (Signed)
Pt thrashing around on the bed, cursing, trying to flail his arms.
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
## 2022-07-27 NOTE — ED Notes (Signed)
TTS in progress 

## 2022-07-27 NOTE — ED Notes (Signed)
Patient still agitated with restraints on him. Patient fighting to get out.

## 2022-07-28 MED ORDER — QUETIAPINE FUMARATE 25 MG PO TABS
25.0000 mg | ORAL_TABLET | Freq: Two times a day (BID) | ORAL | Status: DC
  Administered 2022-07-28 – 2022-08-02 (×11): 25 mg via ORAL
  Filled 2022-07-28 (×11): qty 1

## 2022-07-28 MED ORDER — HALOPERIDOL LACTATE 5 MG/ML IJ SOLN
5.0000 mg | Freq: Once | INTRAMUSCULAR | Status: AC
  Administered 2022-07-28: 5 mg via INTRAMUSCULAR
  Filled 2022-07-28: qty 1

## 2022-07-28 MED ORDER — DIPHENHYDRAMINE HCL 50 MG/ML IJ SOLN
50.0000 mg | Freq: Once | INTRAMUSCULAR | Status: AC
  Administered 2022-07-28: 50 mg via INTRAMUSCULAR
  Filled 2022-07-28: qty 1

## 2022-07-28 MED ORDER — HYDROXYZINE HCL 25 MG PO TABS
25.0000 mg | ORAL_TABLET | Freq: Four times a day (QID) | ORAL | Status: DC | PRN
  Administered 2022-07-28 – 2022-08-01 (×9): 25 mg via ORAL
  Filled 2022-07-28 (×11): qty 1

## 2022-07-28 MED ORDER — DONEPEZIL HCL 5 MG PO TABS
5.0000 mg | ORAL_TABLET | Freq: Every day | ORAL | Status: DC
  Administered 2022-07-28 – 2022-08-02 (×6): 5 mg via ORAL
  Filled 2022-07-28 (×6): qty 1

## 2022-07-28 NOTE — NC FL2 (Signed)
[place] LEVEL OF CARE FORM     IDENTIFICATION  Patient Name: Terry Sloan Birthdate: May 26, 1941 Sex: male Admission Date (Current Location): 07/27/2022  [facility] and [place] Number:  [employer] and Address:  [facility] [address], [place]      Provider Number: [id number]  Attending Physician Name and Address:  Default, Provider, MD  Relative Name and Phone Number:  Madelyn Flavors (Daughter) [phone]    Current Level of Care: Hospital Recommended Level of Care: [place] Prior Approval Number:    Date Approved/Denied:   PASRR Number: [id number]  Discharge Plan: SNF    Current Diagnoses: Patient Active Problem List   Diagnosis Date Noted   Dementia with behavioral disturbance ([place]) 07/27/2022   OAB (overactive bladder) 01/25/2019   BPH (benign prostatic hyperplasia) 07/27/2018   History of total left knee replacement 04/17/2017   History of TIA (transient ischemic attack) 10/16/2016   Aphasia 10/16/2016   Anxiety and depression 10/16/2016   PVD (peripheral vascular disease) ([place]) 09/23/2016   Mixed hyperlipidemia 09/23/2016   Weight loss, unintentional 09/23/2016    Orientation RESPIRATION BLADDER Height & Weight     Self  Normal Continent Weight: 81.1 kg Height:  '5\' 10"'$  (177.8 cm)  BEHAVIORAL SYMPTOMS/MOOD NEUROLOGICAL BOWEL NUTRITION STATUS      Continent Diet (See DC summary)  AMBULATORY STATUS COMMUNICATION OF NEEDS Skin     Verbally Normal                       Personal Care Assistance Level of Assistance  Bathing, Feeding, Dressing Bathing Assistance: Maximum assistance Feeding assistance: Limited assistance Dressing Assistance: Maximum assistance     Functional Limitations Info  Sight, Hearing, Speech Sight Info: Impaired Hearing Info: Adequate Speech Info: Adequate    SPECIAL CARE FACTORS FREQUENCY  PT (By licensed PT)     PT Frequency: 5 Times a week               Contractures Contractures Info: Not present    Additional Factors Info  Code Status, Allergies Code Status Info: FULL Allergies Info: Celebrex           Current Medications (07/28/2022):  This is the current hospital active medication list Current Facility-Administered Medications  Medication Dose Route Frequency Provider Last Rate Last Admin   donepezil (ARICEPT) tablet 5 mg  5 mg Oral Daily [name], MD   5 mg at 07/28/22 1223   hydrOXYzine (ATARAX) tablet 25 mg  25 mg Oral Q6H PRN [name], MD   25 mg at 07/28/22 1222   LORazepam (ATIVAN) tablet 1 mg  1 mg Oral Q6H PRN [name], MD   1 mg at 07/27/22 1345   pravastatin (PRAVACHOL) tablet 10 mg  10 mg Oral Daily [name], MD   10 mg at 07/28/22 1222   QUEtiapine (SEROQUEL) tablet 25 mg  25 mg Oral BID [name], MD   25 mg at 07/28/22 1229   tamsulosin (FLOMAX) capsule 0.4 mg  0.4 mg Oral QPC supper [name], MD   0.4 mg at 07/28/22 1226   Current Outpatient Medications  Medication Sig Dispense Refill   HYDROXYZINE PAMOATE PO Take 25 mg by mouth every 8 (eight) hours as needed (anxiety).     Melatonin 3 MG CAPS Take 2 capsules by mouth at bedtime.     multivitamin-lutein (OCUVITE-LUTEIN) CAPS capsule Take 1 capsule by mouth daily.  oxybutynin (DITROPAN-XL) 5 MG 24 hr tablet Take 5 mg by mouth daily.     pravastatin (PRAVACHOL) 40 MG tablet TAKE ONE (1) TABLET BY MOUTH EVERY DAY 90 tablet 1   tamsulosin (FLOMAX) 0.4 MG CAPS capsule Take 1 capsule (0.4 mg total) by mouth daily after supper. 90 capsule 3     Discharge Medications: Please see discharge summary for a list of discharge medications.  Relevant Imaging Results:  Relevant Lab Results:   Additional Information SS# [id number]    Dementia  Boneta Lucks, RN

## 2022-07-28 NOTE — ED Notes (Addendum)
Gave pt a Happy Hands Overlay to help stimulate pt's mind.
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
## 2022-07-28 NOTE — ED Notes (Addendum)
Pt screaming and cursing loudly, pt is not redirectable. Pt moving all over bed, trying to get up

## 2022-07-28 NOTE — ED Notes (Signed)
Pt turned and changed, calm, compliant and following instructions.

## 2022-07-28 NOTE — Evaluation (Signed)
Physical Therapy Evaluation Patient Details Name: Terry Sloan MRN: [id number] DOB: 1941/04/01 Today's Date: 07/28/2022  History of Present Illness  Terry Sloan is a 82 y.o. male.     Patient has a history of dementia and GERD.  He has become aggressive at home and his daughter states she cannot take care of him anymore.  She wants him placed in a nursing home   Clinical Impression  Patient demonstrates slow labored movement for sitting up at bedside, very unsteady on feet and limited to a few side steps before having to sit due to c/o fatigue and generalized weakness.  Patient put back to bed with Min/mod assist to reposition.  Patient will benefit from continued skilled physical therapy in hospital and recommended venue below to increase strength, balance, endurance for safe ADLs and gait.         Recommendations for follow up therapy are one component of a multi-disciplinary discharge planning process, led by the attending physician.  Recommendations may be updated based on patient status, additional functional criteria and insurance authorization.  Follow Up Recommendations Skilled nursing-short term rehab (<3 hours/day) Can patient physically be transported by private vehicle: No    Assistance Recommended at Discharge Frequent or constant Supervision/Assistance  Patient can return home with the following  A lot of help with walking and/or transfers;A lot of help with bathing/dressing/bathroom;Assistance with cooking/housework;Help with stairs or ramp for entrance    Equipment Recommendations None recommended by PT  Recommendations for Other Services       Functional Status Assessment Patient has had a recent decline in their functional status and demonstrates the ability to make significant improvements in function in a reasonable and predictable amount of time.     Precautions / Restrictions Precautions Precautions: Fall Restrictions Weight Bearing Restrictions: No       Mobility  Bed Mobility Overal bed mobility: Needs Assistance Bed Mobility: Supine to Sit, Sit to Supine     Supine to sit: Min assist, Mod assist Sit to supine: Min assist, Mod assist   General bed mobility comments: increased time, labored movement    Transfers Overall transfer level: Needs assistance Equipment used: Rolling walker (2 wheels) Transfers: Sit to/from Stand Sit to Stand: Mod assist           General transfer comment: unsteady labored movement, unable to maintain standing balance without AD    Ambulation/Gait Ambulation/Gait assistance: Mod assist, Max assist Gait Distance (Feet): 4 Feet Assistive device: Rolling walker (2 wheels) Gait Pattern/deviations: Decreased step length - right, Decreased step length - left, Decreased stride length Gait velocity: slow     General Gait Details: limited to a few side steps before having to sit due to c/o fatigue and BLE weakness  Stairs            Wheelchair Mobility    Modified Rankin (Stroke Patients Only)       Balance                                             Pertinent Vitals/Pain Pain Assessment Pain Assessment: No/denies pain    Home Living Family/patient expects to be discharged to:: Private residence Living Arrangements: Spouse/significant other Available Help at Discharge: Family;Available PRN/intermittently Type of Home: House Home Access: Level entry       Home Layout: One level Home Equipment: Conservation officer, nature (2 wheels)  Additional Comments: most info from previous admission due to patient is poor historian    Prior Function Prior Level of Function : Independent/Modified Independent             Mobility Comments: household ambulator without AD, "per patient" ADLs Comments: assisted by family     Hand Dominance        Extremity/Trunk Assessment   Upper Extremity Assessment Upper Extremity Assessment: Generalized weakness    Lower Extremity  Assessment Lower Extremity Assessment: Generalized weakness    Cervical / Trunk Assessment Cervical / Trunk Assessment: Kyphotic  Communication   Communication: No difficulties  Cognition Arousal/Alertness: Awake/alert Behavior During Therapy: Restless Overall Cognitive Status: History of cognitive impairments - at baseline                                 General Comments: follows directions with repeated verbal/tactile cueing        General Comments      Exercises     Assessment/Plan    PT Assessment Patient needs continued PT services  PT Problem List Decreased strength;Decreased activity tolerance;Decreased balance;Decreased mobility       PT Treatment Interventions DME instruction;Gait training;Stair training;Functional mobility training;Therapeutic activities;Therapeutic exercise;Balance training;Patient/family education    PT Goals (Current goals can be found in the Care Plan section)  Acute Rehab PT Goals Patient Stated Goal: return home PT Goal Formulation: With patient Time For Goal Achievement: 08/11/22 Potential to Achieve Goals: Good    Frequency Min 2X/week     Co-evaluation               AM-PAC PT "6 Clicks" Mobility  Outcome Measure Help needed turning from your back to your side while in a flat bed without using bedrails?: A Lot Help needed moving from lying on your back to sitting on the side of a flat bed without using bedrails?: A Lot Help needed moving to and from a bed to a chair (including a wheelchair)?: A Lot Help needed standing up from a chair using your arms (e.g., wheelchair or bedside chair)?: A Lot Help needed to walk in hospital room?: A Lot Help needed climbing 3-5 steps with a railing? : A Lot 6 Click Score: 12    End of Session Equipment Utilized During Treatment: Gait belt Activity Tolerance: Patient tolerated treatment well;Patient limited by fatigue Patient left: in bed;with call bell/phone within  reach;with nursing/sitter in room Nurse Communication: Mobility status PT Visit Diagnosis: Unsteadiness on feet (R26.81);Other abnormalities of gait and mobility (R26.89);Muscle weakness (generalized) (M62.81)    Time: [id number] PT Time Calculation (min) (ACUTE ONLY): 25 min   Charges:   PT Evaluation $PT Eval Moderate Complexity: 1 Mod PT Treatments $Therapeutic Activity: 23-37 mins        1:37 PM, 07/28/22 Lonell Grandchild, MPT Physical Therapist with [facility] 336 [phone] office [phone] mobile phone

## 2022-07-28 NOTE — ED Notes (Signed)
Ronny Bacon (daughter ) called to check on pt. During conversation she verbalized her PCP already filled out a FL2 and will have access to getting it from her PCP on Monday. Nurse advised daughter to call Monday and get a copy sent to the hospital.

## 2022-07-28 NOTE — ED Notes (Signed)
Pt continues to scream and cursing loudly

## 2022-07-28 NOTE — ED Notes (Signed)
Daughter at bedside with pt.

## 2022-07-28 NOTE — ED Notes (Signed)
Verbal orders received for po meds

## 2022-07-28 NOTE — ED Notes (Signed)
Pt continues to do well without restraints on.

## 2022-07-28 NOTE — ED Notes (Signed)
Pt calmly sitting on bed talking with sitter.

## 2022-07-28 NOTE — ED Provider Notes (Signed)
Emergency Medicine Observation Re-evaluation Note  Terry Sloan is a 82 y.o. male, seen on rounds today.  Pt initially presented to the ED for complaints of Aggressive Behavior and Dementia Currently, the patient is stable.  Physical Exam  BP 129/88   Pulse 75   Temp 98.1 F (36.7 C) (Oral)   Resp 18   Ht '5\' 10"'$  (1.778 m)   Wt 81.1 kg   SpO2 96%   BMI 25.64 kg/m  Physical Exam Alert in mild distress  ED Course / MDM  EKG:EKG Interpretation  Date/Time:  Saturday July 27 2022 10:51:36 EST Ventricular Rate:  82 PR Interval:  163 QRS Duration: 86 QT Interval:  376 QTC Calculation: 440 R Axis:   3 Text Interpretation: Sinus rhythm Low voltage, precordial leads No old tracing to compare Confirmed by Isla Pence [phone]) on 07/27/2022 3:18:18 PM  I have reviewed the labs performed to date as well as medications administered while in observation.  Recent changes in the last 24 hours include none.  Plan  Current plan is for nh placement.    [name], MD 07/28/22 [phone]

## 2022-07-28 NOTE — ED Notes (Signed)
Son at bedside. Pt calm.

## 2022-07-28 NOTE — Plan of Care (Signed)
  Problem: Acute Rehab PT Goals(only PT should resolve) Goal: Pt Will Go Supine/Side To Sit Outcome: Progressing Flowsheets (Taken 07/28/2022 1339) Pt will go Supine/Side to Sit:  with min guard assist  with minimal assist Goal: Patient Will Transfer Sit To/From Stand Outcome: Progressing Flowsheets (Taken 07/28/2022 1339) Patient will transfer sit to/from stand:  with min guard assist  with minimal assist Goal: Pt Will Transfer Bed To Chair/Chair To Bed Outcome: Progressing Flowsheets (Taken 07/28/2022 1339) Pt will Transfer Bed to Chair/Chair to Bed:  with min assist  with mod assist Goal: Pt Will Ambulate Outcome: Progressing Flowsheets (Taken 07/28/2022 1339) Pt will Ambulate:  25 feet  with minimal assist  with rolling walker   1:40 PM, 07/28/22 Lonell Grandchild, MPT Physical Therapist with [facility] [phone] office [phone] mobile phone

## 2022-07-29 LAB — URINE CULTURE

## 2022-07-29 NOTE — ED Notes (Signed)
CSW spoke to pts daughter. CSW provided update that there are no bed offers at this time. Pts daughter states that she completed the Medicaid application with [employer] this morning. Pts daughter states that pt owns a truck and an acre of land that will need to be sold before he can be a candidate for Medicaid. Pts daughter and son are meeting tomorrow to begin process of selling land. TOC to follow.

## 2022-07-29 NOTE — ED Notes (Signed)
Pt very restless, getting agitated. Has been yelling out for his mama more frequently and scooting toward the end of the bed stating he is getting up to go find his mama and his wife.

## 2022-07-29 NOTE — ED Provider Notes (Signed)
Emergency Medicine Observation Re-evaluation Note  Terry Sloan is a 82 y.o. male, seen on rounds today.  Pt initially presented to the ED for complaints of Aggressive Behavior and Dementia Currently, the patient is nursing home placement.  Physical Exam  BP (!) 90/54 (BP Location: Right Arm)   Pulse 61   Temp 97.7 F (36.5 C) (Axillary)   Resp 14   Ht '5\' 10"'$  (1.778 m)   Wt 81.1 kg   SpO2 94%   BMI 25.64 kg/m  Physical Exam Alert no acute distress  ED Course / MDM  EKG:EKG Interpretation  Date/Time:  Saturday July 27 2022 10:51:36 EST Ventricular Rate:  82 PR Interval:  163 QRS Duration: 86 QT Interval:  376 QTC Calculation: 440 R Axis:   3 Text Interpretation: Sinus rhythm Low voltage, precordial leads No old tracing to compare Confirmed by Isla Pence [phone]) on 07/27/2022 3:18:18 PM  I have reviewed the labs performed to date as well as medications administered while in observation.  Recent changes in the last 24 hours include none.  Plan  Current plan is for nursing home placement.    [name], MD 07/29/22 1023

## 2022-07-29 NOTE — ED Notes (Signed)
Cleaned the bed and replaced the pure wick.

## 2022-07-29 NOTE — ED Notes (Signed)
Pt care taken, no complaints at this time.

## 2022-07-30 NOTE — ED Provider Notes (Signed)
Emergency Medicine Observation Re-evaluation Note  Terry Sloan is a 82 y.o. male, seen on rounds today.  Pt initially presented to the ED for complaints of Aggressive Behavior and Dementia Currently, the patient is sleeping.  Physical Exam  BP (!) 130/97 (BP Location: Right Arm)   Pulse 82   Temp 98.1 F (36.7 C) (Oral)   Resp 18   Ht '5\' 10"'$  (1.778 m)   Wt 81.1 kg   SpO2 99%   BMI 25.64 kg/m  Physical Exam General: Sleeping Cardiac: Extremities well-perfused Lungs: Breathing is unlabored Psych: Deferred  ED Course / MDM  EKG:EKG Interpretation  Date/Time:  Saturday July 27 2022 10:51:36 EST Ventricular Rate:  82 PR Interval:  163 QRS Duration: 86 QT Interval:  376 QTC Calculation: 440 R Axis:   3 Text Interpretation: Sinus rhythm Low voltage, precordial leads No old tracing to compare Confirmed by Isla Pence [phone]) on 07/27/2022 3:18:18 PM  I have reviewed the labs performed to date as well as medications administered while in observation.  Recent changes in the last 24 hours include none.  Plan  Current plan is for social work placement.    Godfrey Pick, MD 07/30/22 [phone]

## 2022-07-30 NOTE — ED Notes (Signed)
Pt received breakfast

## 2022-07-30 NOTE — ED Notes (Signed)
Pt's daughter called for an update to pt's disposition and behavior. Daughter updated

## 2022-07-30 NOTE — Progress Notes (Addendum)
Faxed over referral to [facility].   Addend @ 10:51 AM Terry Sloan has denied pt due to behaviors.

## 2022-07-30 NOTE — ED Notes (Signed)
Family at bedside. 

## 2022-07-30 NOTE — Progress Notes (Addendum)
Spoke with pt's daughter, Ronny Bacon, who reported she and her brother are working on selling the pt's truck today and will work on the [employer]. Ronny Bacon stated the case worker put the Medicaid application through and told them to provide receipts of the truck and land being sold. Ronny Bacon is interested in the pt going to STR and transitioning into LTC. Ebony Hail at [facility] stated they do not currently have a LTC bed but may be able to accommodate the pt in their skilled memory care unit. This CSW has reached out to [facility]. TOC following.   Addend @ 2:49 PM [facility] is unable to offer a bed at this time. Ebony Hail reported that pt is unable come into the SNF for STR and transition into LTC. Notified CSWs who will be covering AP today and tomorrow.

## 2022-07-30 NOTE — Progress Notes (Signed)
This CSW followed up with [facility]. Awaiting response.

## 2022-07-31 MED ORDER — ZIPRASIDONE MESYLATE 20 MG IM SOLR
10.0000 mg | Freq: Once | INTRAMUSCULAR | Status: AC
  Administered 2022-07-31: 10 mg via INTRAMUSCULAR
  Filled 2022-07-31: qty 20

## 2022-07-31 MED ORDER — STERILE WATER FOR INJECTION IJ SOLN
INTRAMUSCULAR | Status: AC
  Administered 2022-07-31: 10 mL
  Filled 2022-07-31: qty 10

## 2022-07-31 NOTE — ED Notes (Addendum)
Pt has calmed down, currently playing with the [facility]. Pt does continue to yell out for family, ask to leave and tries to pull at his male external urinary catheter. Has been redirectable.

## 2022-07-31 NOTE — ED Notes (Signed)
Pt a little agitated, trying to climb out of bed; medications given and pt encouraged to stay in bed; pt informed breakfast should be here soon; pt verbalized understanding; sitter at bedside

## 2022-07-31 NOTE — ED Provider Notes (Signed)
Emergency Medicine Observation Re-evaluation Note  Terry Sloan is a 82 y.o. male, seen on rounds today.  Pt initially presented to the ED for complaints of Aggressive Behavior and Dementia Currently, the patient is awaiting placement.  Physical Exam  BP 112/78 (BP Location: Right Arm)   Pulse 84   Temp 98 F (36.7 C) (Oral)   Resp 16   Ht '5\' 10"'$  (1.778 m)   Wt 81.1 kg   SpO2 96%   BMI 25.64 kg/m  Physical Exam General: Calm Cardiac: Well perfused.  Lungs: even respirations Psych: Calm  ED Course / MDM  EKG:EKG Interpretation  Date/Time:  Saturday July 27 2022 10:51:36 EST Ventricular Rate:  82 PR Interval:  163 QRS Duration: 86 QT Interval:  376 QTC Calculation: 440 R Axis:   3 Text Interpretation: Sinus rhythm Low voltage, precordial leads No old tracing to compare Confirmed by Isla Pence [phone]) on 07/27/2022 3:18:18 PM  I have reviewed the labs performed to date as well as medications administered while in observation.  Recent changes in the last 24 hours include awaiting placement.   02:05 PM  Called to bedside by nursing staff.  They were assisting the patient and states that he began to grab and swing toward them as of trying to assault them.  He required manual hold.  He is not redirectable on my assessment.  Plan for IM [facility].   Plan  Current plan is for TTS/placement.     Margette Fast, MD 08/01/22 2320

## 2022-07-31 NOTE — ED Notes (Signed)
Writer was sitting inside of pt's room and all of a sudden, pt began stating "are y'all going to kill me?". Writer attempted to calm patient down, but patient continued to ask about being hurt. Pt began scooting down to the edge of the bed, writer attempted to redirected patient back up to the ed. Pt has been all day attempting to scoot down the bed. Patient was no-redirectable this time. RN came into the room and asked patient to sit back down. Pt then took one step forward and attempting to push RN. Rn Risk analyst were able to put patient back onto bed, RN called for help. Pt began yelling, kicking and attempting to hit all staff members in the room. Pt now in restraints, hollering 'help'. Patient has also been calling out for people who are not there.

## 2022-07-31 NOTE — ED Notes (Addendum)
Pt assessed for restraint removal. It still very confused and restless and at risk to hurt self due to unawareness of surroundings. Pt not redirected and does not like to staff to get close.

## 2022-07-31 NOTE — ED Notes (Signed)
Pt's dinner has arrived, pt sat up on stretcher and ate 70% of their food. Writer kept rest of food at bedside table

## 2022-07-31 NOTE — ED Notes (Signed)
Pt's lunch arrived, pt sitting up on stretcher. Pt did well, ate all of food.

## 2022-07-31 NOTE — ED Notes (Signed)
Pt's breakfast has arrived, writer sat pt on the side of the bed due to pt complaining of back pain and leg pain. Writer then sat patient back up on the stretcher, pt ate all of his breakfast. Did need assistance.

## 2022-07-31 NOTE — ED Notes (Signed)
Patient screaming and yelling bc he is in restraints but patient refuses to comply and stay in the bed due to the danger of falling.

## 2022-07-31 NOTE — ED Notes (Signed)
Family at bedside visiting with patient. Patient is calm and cooperative at this time.

## 2022-07-31 NOTE — ED Notes (Addendum)
CSW reached out to [place], she requests that CSW send pts referral over via secure email. CSW sent requested clinicals over for review. TOC to follow.   Addendum 3:15pm: CSW spoke with DON at [facility], they are unable to offer pt a bed at this time. Referral to be sent out further.

## 2022-07-31 NOTE — ED Notes (Signed)
Patient became violent and aggressive.  Patient started kicking and hitting and trying to get out of the bed.  Security and multiple staff members called to bed to assist getting patient back in the bed to keep patient safe.

## 2022-07-31 NOTE — ED Notes (Signed)
Pt given an ice cream for a snack, family arrived at this time. Pt's daughter and son in the room at this moment. Pt's daughter brought in an Copy to shave his beard. Pt calm and cooperative at this time.

## 2022-07-31 NOTE — ED Provider Notes (Signed)
Was informed by the nurses that the patient became more agitated and was unable to be redirected.  Patient became violent towards nurse.  He was placed in soft restraints for his and staff safety.  He is currently calm   Terry Rasmussen, MD 07/31/22 [phone]

## 2022-07-31 NOTE — ED Notes (Signed)
Pt's grand-daughter is currently visiting patient

## 2022-08-01 ENCOUNTER — Encounter (HOSPITAL_COMMUNITY): Payer: Self-pay

## 2022-08-01 MED ORDER — MELATONIN 3 MG PO TABS
3.0000 mg | ORAL_TABLET | Freq: Every day | ORAL | Status: DC
  Administered 2022-08-01: 3 mg via ORAL
  Filled 2022-08-01: qty 1

## 2022-08-01 MED ORDER — OLANZAPINE 5 MG PO TBDP
10.0000 mg | ORAL_TABLET | Freq: Every day | ORAL | Status: DC
  Filled 2022-08-01: qty 2

## 2022-08-01 MED ORDER — ACETAMINOPHEN 325 MG PO TABS
650.0000 mg | ORAL_TABLET | Freq: Four times a day (QID) | ORAL | Status: DC | PRN
  Administered 2022-08-01 – 2022-08-02 (×3): 650 mg via ORAL
  Filled 2022-08-01 (×3): qty 2

## 2022-08-01 MED ORDER — ZIPRASIDONE MESYLATE 20 MG IM SOLR
20.0000 mg | Freq: Once | INTRAMUSCULAR | Status: AC
  Administered 2022-08-01: 20 mg via INTRAMUSCULAR
  Filled 2022-08-01: qty 20

## 2022-08-01 MED ORDER — TRAZODONE HCL 50 MG PO TABS
50.0000 mg | ORAL_TABLET | Freq: Once | ORAL | Status: AC
  Administered 2022-08-01: 50 mg via ORAL
  Filled 2022-08-01 (×2): qty 1

## 2022-08-01 NOTE — ED Notes (Signed)
Pt not restful and PRN medications given. Pt still attempting to get out of bed even with pharmacological interventions. Pt is alert but confused. Not redirected. Restraints still in place for safety of pt and staff.

## 2022-08-01 NOTE — ED Notes (Signed)
Pt c/o back pain and readjustment was not effective.

## 2022-08-01 NOTE — ED Notes (Signed)
Pt ambulated from one bed to another with assistance without any issues. Pt is weak and needs time for legs to adjust to walking. Daughter at bedside during transfer.

## 2022-08-01 NOTE — ED Notes (Signed)
Pt crying in bed softly. Sitter at bedside for comfort.

## 2022-08-01 NOTE — ED Notes (Signed)
Pt is confused and still irritable at this time. Pt in restraints but attempting to climb out of bed and pulling at all cords. Pt trying to get over side rail of the bed. RN tried redirecting pt but pt does not follow commands. Explained to pt ways to be able to come out of restraints. Pt is alert but confused. Sitter at bedside.

## 2022-08-01 NOTE — ED Provider Notes (Signed)
Patient is resting comfortably and is alert.  He is behaving appropriately and no restraints are being used   Terry Ferguson, MD 08/01/22 1011

## 2022-08-01 NOTE — ED Notes (Signed)
Pt has been up all night and very irritable. Multiple attempts to redirected pt and explanation on trying to get some rest. Assessed for possible restraint discontinuation but risk is too high.

## 2022-08-01 NOTE — ED Notes (Signed)
CSW updated by Ebony Hail at [facility] who states that they can offer pt a bed at their facility. CSW spoke to pts daughter Ronny Bacon who states she is agreeable to accepting the bed for pt. CSW to start insurance auth. Pt will need to have insurance auth before he can D/C. TOC to follow.

## 2022-08-01 NOTE — ED Provider Notes (Signed)
Emergency Medicine Observation Re-evaluation Note  Terry Sloan is a 82 y.o. male, seen on rounds today.  Pt initially presented to the ED for complaints of Aggressive Behavior and Dementia Currently, the patient is awaiting nursing home placement.  Physical Exam  BP (!) 112/93   Pulse 64   Temp 97.7 F (36.5 C) (Oral)   Resp 18   Ht '5\' 10"'$  (1.778 m)   Wt 81.1 kg   SpO2 98%   BMI 25.64 kg/m  Physical Exam Alert in no distress  ED Course / MDM  EKG:EKG Interpretation  Date/Time:  Saturday July 27 2022 10:51:36 EST Ventricular Rate:  82 PR Interval:  163 QRS Duration: 86 QT Interval:  376 QTC Calculation: 440 R Axis:   3 Text Interpretation: Sinus rhythm Low voltage, precordial leads No old tracing to compare Confirmed by Isla Pence [phone]) on 07/27/2022 3:18:18 PM  I have reviewed the labs performed to date as well as medications administered while in observation.  Recent changes in the last 24 hours include none.  Plan  Current plan is for nursing home placement.    [name], MD 08/01/22 [phone]

## 2022-08-01 NOTE — ED Notes (Signed)
CSW spoke to pts daughter for update on selling of pts truck and land. Pts daughter states that they have gotten the truck taken care of. At this time they are working on selling the land, it may take another week. CSW reached out to MD and RN requesting that pt be reassessed for a med adjustment. Pt is in restraints currently and there are no facilities able to accept pts in restraints. TOC to follow.

## 2022-08-01 NOTE — ED Notes (Signed)
Pt calm and talking with staff and daughter. Pt eating and drinking and making jokes with nurse.

## 2022-08-01 NOTE — ED Notes (Signed)
Pt resting at this time.

## 2022-08-02 DIAGNOSIS — M6281 Muscle weakness (generalized): Secondary | ICD-10-CM | POA: Diagnosis not present

## 2022-08-02 DIAGNOSIS — Z66 Do not resuscitate: Secondary | ICD-10-CM | POA: Diagnosis not present

## 2022-08-02 DIAGNOSIS — F02C2 Dementia in other diseases classified elsewhere, severe, with psychotic disturbance: Secondary | ICD-10-CM | POA: Diagnosis not present

## 2022-08-02 DIAGNOSIS — F419 Anxiety disorder, unspecified: Secondary | ICD-10-CM | POA: Diagnosis not present

## 2022-08-02 DIAGNOSIS — F0393 Unspecified dementia, unspecified severity, with mood disturbance: Secondary | ICD-10-CM | POA: Diagnosis not present

## 2022-08-02 DIAGNOSIS — N3281 Overactive bladder: Secondary | ICD-10-CM | POA: Diagnosis not present

## 2022-08-02 DIAGNOSIS — E860 Dehydration: Secondary | ICD-10-CM | POA: Diagnosis not present

## 2022-08-02 DIAGNOSIS — G2581 Restless legs syndrome: Secondary | ICD-10-CM | POA: Diagnosis present

## 2022-08-02 DIAGNOSIS — R456 Violent behavior: Secondary | ICD-10-CM | POA: Diagnosis not present

## 2022-08-02 DIAGNOSIS — N179 Acute kidney failure, unspecified: Secondary | ICD-10-CM | POA: Diagnosis not present

## 2022-08-02 DIAGNOSIS — B348 Other viral infections of unspecified site: Secondary | ICD-10-CM | POA: Diagnosis not present

## 2022-08-02 DIAGNOSIS — I679 Cerebrovascular disease, unspecified: Secondary | ICD-10-CM | POA: Diagnosis not present

## 2022-08-02 DIAGNOSIS — Z7409 Other reduced mobility: Secondary | ICD-10-CM | POA: Diagnosis not present

## 2022-08-02 DIAGNOSIS — Z87891 Personal history of nicotine dependence: Secondary | ICD-10-CM | POA: Diagnosis not present

## 2022-08-02 DIAGNOSIS — I739 Peripheral vascular disease, unspecified: Secondary | ICD-10-CM | POA: Diagnosis not present

## 2022-08-02 DIAGNOSIS — E782 Mixed hyperlipidemia: Secondary | ICD-10-CM | POA: Diagnosis not present

## 2022-08-02 DIAGNOSIS — R4181 Age-related cognitive decline: Secondary | ICD-10-CM | POA: Diagnosis not present

## 2022-08-02 DIAGNOSIS — Z1152 Encounter for screening for COVID-19: Secondary | ICD-10-CM | POA: Diagnosis not present

## 2022-08-02 DIAGNOSIS — Z7189 Other specified counseling: Secondary | ICD-10-CM | POA: Diagnosis not present

## 2022-08-02 DIAGNOSIS — R5383 Other fatigue: Secondary | ICD-10-CM | POA: Diagnosis not present

## 2022-08-02 DIAGNOSIS — Z515 Encounter for palliative care: Secondary | ICD-10-CM | POA: Diagnosis not present

## 2022-08-02 DIAGNOSIS — R410 Disorientation, unspecified: Secondary | ICD-10-CM | POA: Diagnosis not present

## 2022-08-02 DIAGNOSIS — F03918 Unspecified dementia, unspecified severity, with other behavioral disturbance: Secondary | ICD-10-CM | POA: Diagnosis not present

## 2022-08-02 DIAGNOSIS — M6289 Other specified disorders of muscle: Secondary | ICD-10-CM | POA: Diagnosis not present

## 2022-08-02 DIAGNOSIS — F0392 Unspecified dementia, unspecified severity, with psychotic disturbance: Secondary | ICD-10-CM | POA: Diagnosis present

## 2022-08-02 DIAGNOSIS — R680 Hypothermia, not associated with low environmental temperature: Secondary | ICD-10-CM | POA: Diagnosis present

## 2022-08-02 DIAGNOSIS — R4701 Aphasia: Secondary | ICD-10-CM | POA: Diagnosis not present

## 2022-08-02 DIAGNOSIS — R400 Somnolence: Secondary | ICD-10-CM | POA: Diagnosis not present

## 2022-08-02 DIAGNOSIS — F03911 Unspecified dementia, unspecified severity, with agitation: Secondary | ICD-10-CM | POA: Diagnosis present

## 2022-08-02 DIAGNOSIS — I959 Hypotension, unspecified: Secondary | ICD-10-CM | POA: Diagnosis not present

## 2022-08-02 DIAGNOSIS — F0390 Unspecified dementia without behavioral disturbance: Secondary | ICD-10-CM | POA: Diagnosis not present

## 2022-08-02 DIAGNOSIS — R4182 Altered mental status, unspecified: Secondary | ICD-10-CM | POA: Diagnosis not present

## 2022-08-02 DIAGNOSIS — E870 Hyperosmolality and hypernatremia: Secondary | ICD-10-CM | POA: Diagnosis not present

## 2022-08-02 DIAGNOSIS — N400 Enlarged prostate without lower urinary tract symptoms: Secondary | ICD-10-CM | POA: Diagnosis not present

## 2022-08-02 DIAGNOSIS — G4700 Insomnia, unspecified: Secondary | ICD-10-CM | POA: Diagnosis present

## 2022-08-02 DIAGNOSIS — E538 Deficiency of other specified B group vitamins: Secondary | ICD-10-CM | POA: Diagnosis present

## 2022-08-02 DIAGNOSIS — R2681 Unsteadiness on feet: Secondary | ICD-10-CM | POA: Diagnosis not present

## 2022-08-02 DIAGNOSIS — F32A Depression, unspecified: Secondary | ICD-10-CM | POA: Diagnosis not present

## 2022-08-02 DIAGNOSIS — Z79899 Other long term (current) drug therapy: Secondary | ICD-10-CM | POA: Diagnosis not present

## 2022-08-02 DIAGNOSIS — M40203 Unspecified kyphosis, cervicothoracic region: Secondary | ICD-10-CM | POA: Diagnosis not present

## 2022-08-02 DIAGNOSIS — W19XXXA Unspecified fall, initial encounter: Secondary | ICD-10-CM | POA: Diagnosis not present

## 2022-08-02 DIAGNOSIS — F0394 Unspecified dementia, unspecified severity, with anxiety: Secondary | ICD-10-CM | POA: Diagnosis present

## 2022-08-02 NOTE — ED Notes (Signed)
Pt attempting to get out of bed, pt attempting to bite sheets and bed. When staff attempting to help pt back in bed, pt kicking and hitting staff. MD notified.

## 2022-08-02 NOTE — ED Notes (Signed)
Pt cleaned and linen replaced. ROM performed, pt attempting to get out of bed, cussing staff and grabbing at staff while staff are cleaning patient.

## 2022-08-02 NOTE — ED Notes (Signed)
CSW confirmed pts insurance Josem Kaufmann has been approved. CSW spoke to [place] with [facility] who states they can accept pt today. CSW spoke to pts daughter to provide update on plan for D/C to SNF today, she is understanding and agreeable. CSW provided MD and RN with updates. CSW provided RN with numbers for room and report. Transportation to be called by Network engineer when pt is ready. TOC signing off.

## 2022-08-02 NOTE — ED Notes (Signed)
Pt continuing to attempt to get out of bed, kicking and hitting people. Soft waist belt and mitts applied.

## 2022-08-02 NOTE — ED Notes (Signed)
Pt yelling for water this sitter gave pt 2 cups of water and fed him an oatmeal cookie.

## 2022-08-02 NOTE — ED Notes (Addendum)
Pt broke left wrist restraint. Tough cuff restraint applied. Pt continuing to yell out.

## 2022-08-02 NOTE — NC FL2 (Signed)
[name] LEVEL OF CARE FORM     IDENTIFICATION  Patient Name: Terry Sloan Birthdate: [birth year] Sex: male Admission Date (Current Location): 07/27/2022  [facility] and [place] Number:  [employer] and Address:  [place] [address], [place]      Provider Number: [id number]  Attending Physician Name and Address:  Default, Provider, MD  Relative Name and Phone Number:  Terry Sloan (Daughter) [phone]    Current Level of Care: Hospital Recommended Level of Care: [place] Prior Approval Number:    Date Approved/Denied:   PASRR Number: [id number]  Discharge Plan: SNF    Current Diagnoses: Patient Active Problem List   Diagnosis Date Noted   Dementia with behavioral disturbance ([place]) 07/27/2022   OAB (overactive bladder) 01/25/2019   BPH (benign prostatic hyperplasia) 07/27/2018   History of total left knee replacement 04/17/2017   History of TIA (transient ischemic attack) 10/16/2016   Aphasia 10/16/2016   Anxiety and depression 10/16/2016   PVD (peripheral vascular disease) ([place]) 09/23/2016   Mixed hyperlipidemia 09/23/2016   Weight loss, unintentional 09/23/2016    Orientation RESPIRATION BLADDER Height & Weight     Self  Normal Continent Weight: 178 lb 11.2 oz (81.1 kg) Height:  '5\' 10"'$  (177.8 cm)  BEHAVIORAL SYMPTOMS/MOOD NEUROLOGICAL BOWEL NUTRITION STATUS      Continent Diet (See DC summary)  AMBULATORY STATUS COMMUNICATION OF NEEDS Skin     Verbally Normal                       Personal Care Assistance Level of Assistance  Bathing, Feeding, Dressing Bathing Assistance: Maximum assistance Feeding assistance: Limited assistance Dressing Assistance: Maximum assistance     Functional Limitations Info  Sight, Hearing, Speech Sight Info: Impaired Hearing Info: Adequate Speech Info: Adequate    SPECIAL CARE FACTORS FREQUENCY  PT (By licensed PT)     PT Frequency: 5 Times  a week              Contractures Contractures Info: Not present    Additional Factors Info  Code Status, Allergies Code Status Info: FULL Allergies Info: Celebrex           Current Medications (08/02/2022):  This is the current hospital active medication list Current Facility-Administered Medications  Medication Dose Route Frequency Provider Last Rate Last Admin   acetaminophen (TYLENOL) tablet 650 mg  650 mg Oral Q6H PRN [name], MD   650 mg at 08/02/22 0622   donepezil (ARICEPT) tablet 5 mg  5 mg Oral Daily [name], MD   5 mg at 08/01/22 [id number]   hydrOXYzine (ATARAX) tablet 25 mg  25 mg Oral Q6H PRN [name], MD   25 mg at 08/01/22 2104   LORazepam (ATIVAN) tablet 1 mg  1 mg Oral Q6H PRN [name], MD   1 mg at 08/02/22 [id number]   melatonin tablet 3 mg  3 mg Oral QHS [name], MD   3 mg at 08/01/22 2105   OLANZapine zydis (ZYPREXA) disintegrating tablet 10 mg  10 mg Oral QHS [name], [name], MD       pravastatin (PRAVACHOL) tablet 10 mg  10 mg Oral Daily [name], MD   10 mg at 08/01/22 0942   QUEtiapine (SEROQUEL) tablet 25 mg  25 mg Oral BID [name], MD   25 mg at 08/01/22 2104   tamsulosin (FLOMAX) capsule 0.4 mg  0.4 mg Oral QPC  supper [name], MD   0.4 mg at 08/01/22 [id number]   Current Outpatient Medications  Medication Sig Dispense Refill   HYDROXYZINE PAMOATE PO Take 25 mg by mouth every 8 (eight) hours as needed (anxiety).     Melatonin 3 MG CAPS Take 2 capsules by mouth at bedtime.     multivitamin-lutein (OCUVITE-LUTEIN) CAPS capsule Take 1 capsule by mouth daily.     oxybutynin (DITROPAN-XL) 5 MG 24 hr tablet Take 5 mg by mouth daily.     pravastatin (PRAVACHOL) 40 MG tablet TAKE ONE (1) TABLET BY MOUTH EVERY DAY 90 tablet 1   tamsulosin (FLOMAX) 0.4 MG CAPS capsule Take 1 capsule (0.4 mg total) by mouth daily after supper. 90 capsule 3     Discharge Medications: Please see after visit summary for a list of  discharge medications.  Relevant Imaging Results:  Relevant Lab Results:   Additional Information SS# [id number]    Dementia  Iona Beard, [place]

## 2022-08-02 NOTE — ED Notes (Signed)
Attempted to call pt's family to give them an update, left VM.

## 2022-08-02 NOTE — Progress Notes (Signed)
Physical Therapy Treatment Patient Details Name: Terry Sloan MRN: [id number] DOB: May 28, 1941 Today's Date: 08/02/2022   History of Present Illness Terry Sloan is a 82 y.o. male.     Patient has a history of dementia and GERD.  He has become aggressive at home and his daughter states she cannot take care of him anymore.  She wants him placed in a nursing home    PT Comments    Patient demonstrates slow labored movement for sitting up at bedside, fair/good sitting balance once seated, able to follow directions for completing BLE exercises with verbal and occasional tactile cueing and limited to a few steps at bedside secondary to generalized weakness and poor standing balance.  Patient tolerated sitting up at bedside to finish breakfast with nursing staff present after therapy.  Patient will benefit from continued skilled physical therapy in hospital and recommended venue below to increase strength, balance, endurance for safe ADLs and gait.      Recommendations for follow up therapy are one component of a multi-disciplinary discharge planning process, led by the attending physician.  Recommendations may be updated based on patient status, additional functional criteria and insurance authorization.  Follow Up Recommendations  Skilled nursing-short term rehab (<3 hours/day) Can patient physically be transported by private vehicle: Yes   Assistance Recommended at Discharge Frequent or constant Supervision/Assistance  Patient can return home with the following A lot of help with walking and/or transfers;A lot of help with bathing/dressing/bathroom;Assistance with cooking/housework;Help with stairs or ramp for entrance   Equipment Recommendations  Rolling walker (2 wheels)    Recommendations for Other Services       Precautions / Restrictions Precautions Precautions: Fall Restrictions Weight Bearing Restrictions: No     Mobility  Bed Mobility Overal bed mobility: Needs Assistance Bed  Mobility: Supine to Sit     Supine to sit: Min assist, Mod assist     General bed mobility comments: increased time, labored movement    Transfers Overall transfer level: Needs assistance Equipment used: Rolling walker (2 wheels) Transfers: Sit to/from Stand, Bed to chair/wheelchair/BSC Sit to Stand: Min assist   Step pivot transfers: Min assist       General transfer comment: slow labored unsteady movement    Ambulation/Gait Ambulation/Gait assistance: Mod assist Gait Distance (Feet): 15 Feet Assistive device: Rolling walker (2 wheels) Gait Pattern/deviations: Decreased step length - left, Decreased stance time - right, Decreased stride length, Shuffle, Staggering left, Staggering right Gait velocity: slow     General Gait Details: imited to a few side steps, steps forward/backward with frequent staggering left/right with frequent near loss of balance   Stairs             Wheelchair Mobility    Modified Rankin (Stroke Patients Only)       Balance Overall balance assessment: Needs assistance Sitting-balance support: Feet supported, No upper extremity supported Sitting balance-Leahy Scale: Fair Sitting balance - Comments: fair/good seated at EOB   Standing balance support: During functional activity, Bilateral upper extremity supported, Reliant on assistive device for balance Standing balance-Leahy Scale: Poor Standing balance comment: using RW                            Cognition Arousal/Alertness: Awake/alert Behavior During Therapy: WFL for tasks assessed/performed Overall Cognitive Status: History of cognitive impairments - at baseline  Exercises General Exercises - Lower Extremity Long Arc Quad: Both, Strengthening, AAROM, 10 reps, Seated Hip Flexion/Marching: AROM, Strengthening, Both, 10 reps, Seated Toe Raises: 10 reps, Both, Strengthening, AROM, Seated Heel Raises: Both,  10 reps, Strengthening, AROM, Seated    General Comments        Pertinent Vitals/Pain Pain Assessment Pain Assessment: No/denies pain    Home Living                          Prior Function            PT Goals (current goals can now be found in the care plan section) Acute Rehab PT Goals Patient Stated Goal: return home PT Goal Formulation: With patient Time For Goal Achievement: 08/11/22 Potential to Achieve Goals: Good Progress towards PT goals: Progressing toward goals    Frequency    Min 2X/week      PT Plan Current plan remains appropriate    Co-evaluation              AM-PAC PT "6 Clicks" Mobility   Outcome Measure  Help needed turning from your back to your side while in a flat bed without using bedrails?: A Little Help needed moving from lying on your back to sitting on the side of a flat bed without using bedrails?: A Little Help needed moving to and from a bed to a chair (including a wheelchair)?: A Lot Help needed standing up from a chair using your arms (e.g., wheelchair or bedside chair)?: A Lot Help needed to walk in hospital room?: A Lot Help needed climbing 3-5 steps with a railing? : A Lot 6 Click Score: 14    End of Session Equipment Utilized During Treatment: Gait belt Activity Tolerance: Patient tolerated treatment well;Patient limited by fatigue Patient left: in bed (left seated at bedside to finish breakfast with nursing staff present) Nurse Communication: Mobility status PT Visit Diagnosis: Unsteadiness on feet (R26.81);Other abnormalities of gait and mobility (R26.89);Muscle weakness (generalized) (M62.81)     Time: [id number] PT Time Calculation (min) (ACUTE ONLY): 30 min  Charges:  $Therapeutic Exercise: 8-22 mins $Therapeutic Activity: 8-22 mins                     9:41 AM, 08/02/22 Lonell Grandchild, MPT Physical Therapist with [facility] 336 [phone] office [phone] mobile phone

## 2022-08-02 NOTE — ED Notes (Signed)
Patient observed sleeping, 1:1 sitter at bedside.

## 2022-08-02 NOTE — ED Notes (Signed)
Pt continuing to yell out using aggressive and offensive language.

## 2022-08-02 NOTE — ED Notes (Signed)
Gave patient's family an update regarding patient status.

## 2022-08-02 NOTE — Discharge Instructions (Signed)
Go directly to [facility]`

## 2022-08-03 DIAGNOSIS — R4181 Age-related cognitive decline: Secondary | ICD-10-CM | POA: Diagnosis not present

## 2022-08-03 DIAGNOSIS — F03918 Unspecified dementia, unspecified severity, with other behavioral disturbance: Secondary | ICD-10-CM | POA: Diagnosis not present

## 2022-08-05 ENCOUNTER — Telehealth: Payer: Self-pay | Admitting: *Deleted

## 2022-08-05 DIAGNOSIS — F0390 Unspecified dementia without behavioral disturbance: Secondary | ICD-10-CM | POA: Diagnosis not present

## 2022-08-05 DIAGNOSIS — I679 Cerebrovascular disease, unspecified: Secondary | ICD-10-CM | POA: Diagnosis not present

## 2022-08-05 NOTE — Telephone Encounter (Signed)
LMOVM FL2 is ready, is there a place I need to fax it to or is she wanting to pick it up.

## 2022-08-06 DIAGNOSIS — R2681 Unsteadiness on feet: Secondary | ICD-10-CM | POA: Diagnosis not present

## 2022-08-06 DIAGNOSIS — F0390 Unspecified dementia without behavioral disturbance: Secondary | ICD-10-CM | POA: Diagnosis not present

## 2022-08-06 DIAGNOSIS — W19XXXA Unspecified fall, initial encounter: Secondary | ICD-10-CM | POA: Diagnosis not present

## 2022-08-08 ENCOUNTER — Inpatient Hospital Stay
Admission: EM | Admit: 2022-08-08 | Discharge: 2022-11-11 | DRG: 884 | Disposition: A | Discharge status: Hospice | Payer: Medicare HMO | Source: Skilled Nursing Facility | Attending: Internal Medicine | Admitting: Internal Medicine

## 2022-08-08 DIAGNOSIS — Z87442 Personal history of urinary calculi: Secondary | ICD-10-CM

## 2022-08-08 DIAGNOSIS — G4700 Insomnia, unspecified: Secondary | ICD-10-CM | POA: Diagnosis present

## 2022-08-08 DIAGNOSIS — F0392 Unspecified dementia, unspecified severity, with psychotic disturbance: Secondary | ICD-10-CM | POA: Diagnosis present

## 2022-08-08 DIAGNOSIS — Z7401 Bed confinement status: Secondary | ICD-10-CM | POA: Diagnosis not present

## 2022-08-08 DIAGNOSIS — R680 Hypothermia, not associated with low environmental temperature: Secondary | ICD-10-CM | POA: Diagnosis present

## 2022-08-08 DIAGNOSIS — F03918 Unspecified dementia, unspecified severity, with other behavioral disturbance: Principal | ICD-10-CM | POA: Diagnosis present

## 2022-08-08 DIAGNOSIS — F0394 Unspecified dementia, unspecified severity, with anxiety: Secondary | ICD-10-CM | POA: Diagnosis present

## 2022-08-08 DIAGNOSIS — Z0389 Encounter for observation for other suspected diseases and conditions ruled out: Secondary | ICD-10-CM | POA: Diagnosis not present

## 2022-08-08 DIAGNOSIS — Z8673 Personal history of transient ischemic attack (TIA), and cerebral infarction without residual deficits: Secondary | ICD-10-CM

## 2022-08-08 DIAGNOSIS — B348 Other viral infections of unspecified site: Secondary | ICD-10-CM | POA: Diagnosis not present

## 2022-08-08 DIAGNOSIS — N3281 Overactive bladder: Secondary | ICD-10-CM | POA: Diagnosis present

## 2022-08-08 DIAGNOSIS — R159 Full incontinence of feces: Secondary | ICD-10-CM | POA: Diagnosis not present

## 2022-08-08 DIAGNOSIS — G2581 Restless legs syndrome: Secondary | ICD-10-CM | POA: Diagnosis present

## 2022-08-08 DIAGNOSIS — Z96652 Presence of left artificial knee joint: Secondary | ICD-10-CM | POA: Diagnosis present

## 2022-08-08 DIAGNOSIS — E870 Hyperosmolality and hypernatremia: Secondary | ICD-10-CM | POA: Diagnosis not present

## 2022-08-08 DIAGNOSIS — I959 Hypotension, unspecified: Secondary | ICD-10-CM | POA: Diagnosis not present

## 2022-08-08 DIAGNOSIS — R32 Unspecified urinary incontinence: Secondary | ICD-10-CM | POA: Diagnosis not present

## 2022-08-08 DIAGNOSIS — F0393 Unspecified dementia, unspecified severity, with mood disturbance: Secondary | ICD-10-CM | POA: Diagnosis present

## 2022-08-08 DIAGNOSIS — I739 Peripheral vascular disease, unspecified: Secondary | ICD-10-CM | POA: Diagnosis present

## 2022-08-08 DIAGNOSIS — I10 Essential (primary) hypertension: Secondary | ICD-10-CM | POA: Diagnosis not present

## 2022-08-08 DIAGNOSIS — Z515 Encounter for palliative care: Secondary | ICD-10-CM

## 2022-08-08 DIAGNOSIS — N400 Enlarged prostate without lower urinary tract symptoms: Secondary | ICD-10-CM | POA: Diagnosis present

## 2022-08-08 DIAGNOSIS — Z87891 Personal history of nicotine dependence: Secondary | ICD-10-CM

## 2022-08-08 DIAGNOSIS — Z886 Allergy status to analgesic agent status: Secondary | ICD-10-CM

## 2022-08-08 DIAGNOSIS — E782 Mixed hyperlipidemia: Secondary | ICD-10-CM | POA: Diagnosis present

## 2022-08-08 DIAGNOSIS — E538 Deficiency of other specified B group vitamins: Secondary | ICD-10-CM | POA: Diagnosis present

## 2022-08-08 DIAGNOSIS — R4182 Altered mental status, unspecified: Secondary | ICD-10-CM | POA: Diagnosis not present

## 2022-08-08 DIAGNOSIS — R0902 Hypoxemia: Secondary | ICD-10-CM | POA: Diagnosis not present

## 2022-08-08 DIAGNOSIS — R1310 Dysphagia, unspecified: Secondary | ICD-10-CM | POA: Diagnosis not present

## 2022-08-08 DIAGNOSIS — E860 Dehydration: Secondary | ICD-10-CM | POA: Diagnosis not present

## 2022-08-08 DIAGNOSIS — H3530 Unspecified macular degeneration: Secondary | ICD-10-CM | POA: Diagnosis present

## 2022-08-08 DIAGNOSIS — Z1152 Encounter for screening for COVID-19: Secondary | ICD-10-CM | POA: Diagnosis not present

## 2022-08-08 DIAGNOSIS — R404 Transient alteration of awareness: Secondary | ICD-10-CM | POA: Diagnosis not present

## 2022-08-08 DIAGNOSIS — R4781 Slurred speech: Secondary | ICD-10-CM | POA: Diagnosis not present

## 2022-08-08 DIAGNOSIS — F32A Depression, unspecified: Secondary | ICD-10-CM | POA: Diagnosis present

## 2022-08-08 DIAGNOSIS — T68XXXA Hypothermia, initial encounter: Secondary | ICD-10-CM | POA: Insufficient documentation

## 2022-08-08 DIAGNOSIS — F419 Anxiety disorder, unspecified: Secondary | ICD-10-CM | POA: Diagnosis not present

## 2022-08-08 DIAGNOSIS — R9431 Abnormal electrocardiogram [ECG] [EKG]: Secondary | ICD-10-CM | POA: Diagnosis not present

## 2022-08-08 DIAGNOSIS — R600 Localized edema: Secondary | ICD-10-CM | POA: Diagnosis not present

## 2022-08-08 DIAGNOSIS — N179 Acute kidney failure, unspecified: Secondary | ICD-10-CM | POA: Diagnosis not present

## 2022-08-08 DIAGNOSIS — K5900 Constipation, unspecified: Secondary | ICD-10-CM | POA: Diagnosis not present

## 2022-08-08 DIAGNOSIS — Z66 Do not resuscitate: Secondary | ICD-10-CM | POA: Diagnosis present

## 2022-08-08 DIAGNOSIS — E559 Vitamin D deficiency, unspecified: Secondary | ICD-10-CM | POA: Diagnosis present

## 2022-08-08 DIAGNOSIS — R509 Fever, unspecified: Secondary | ICD-10-CM | POA: Diagnosis not present

## 2022-08-08 DIAGNOSIS — R400 Somnolence: Secondary | ICD-10-CM | POA: Diagnosis not present

## 2022-08-08 DIAGNOSIS — Z79899 Other long term (current) drug therapy: Secondary | ICD-10-CM | POA: Diagnosis not present

## 2022-08-08 DIAGNOSIS — R410 Disorientation, unspecified: Secondary | ICD-10-CM | POA: Diagnosis not present

## 2022-08-08 DIAGNOSIS — M1990 Unspecified osteoarthritis, unspecified site: Secondary | ICD-10-CM | POA: Diagnosis present

## 2022-08-08 DIAGNOSIS — Z9842 Cataract extraction status, left eye: Secondary | ICD-10-CM

## 2022-08-08 DIAGNOSIS — Z9841 Cataract extraction status, right eye: Secondary | ICD-10-CM

## 2022-08-08 DIAGNOSIS — R456 Violent behavior: Secondary | ICD-10-CM | POA: Diagnosis not present

## 2022-08-08 DIAGNOSIS — Z7189 Other specified counseling: Secondary | ICD-10-CM | POA: Diagnosis not present

## 2022-08-08 DIAGNOSIS — K219 Gastro-esophageal reflux disease without esophagitis: Secondary | ICD-10-CM | POA: Diagnosis present

## 2022-08-08 DIAGNOSIS — F03911 Unspecified dementia, unspecified severity, with agitation: Secondary | ICD-10-CM | POA: Diagnosis present

## 2022-08-08 LAB — URINE DRUG SCREEN, QUALITATIVE (ARMC ONLY)
Amphetamines, Ur Screen: NOT DETECTED
Barbiturates, Ur Screen: NOT DETECTED
Benzodiazepine, Ur Scrn: NOT DETECTED
Cannabinoid 50 Ng, Ur ~~LOC~~: NOT DETECTED
Cocaine Metabolite,Ur ~~LOC~~: NOT DETECTED
MDMA (Ecstasy)Ur Screen: NOT DETECTED
Methadone Scn, Ur: NOT DETECTED
Opiate, Ur Screen: NOT DETECTED
Phencyclidine (PCP) Ur S: NOT DETECTED
Tricyclic, Ur Screen: NOT DETECTED

## 2022-08-08 LAB — CBC
HCT: 36.4 % — ABNORMAL LOW (ref 39.0–52.0)
Hemoglobin: 12.3 g/dL — ABNORMAL LOW (ref 13.0–17.0)
MCH: 31.5 pg (ref 26.0–34.0)
MCHC: 33.8 g/dL (ref 30.0–36.0)
MCV: 93.3 fL (ref 80.0–100.0)
Platelets: 252 10*3/uL (ref 150–400)
RBC: 3.9 MIL/uL — ABNORMAL LOW (ref 4.22–5.81)
RDW: 12 % (ref 11.5–15.5)
WBC: 7.2 10*3/uL (ref 4.0–10.5)
nRBC: 0 % (ref 0.0–0.2)

## 2022-08-08 LAB — COMPREHENSIVE METABOLIC PANEL
ALT: 22 U/L (ref 0–44)
AST: 43 U/L — ABNORMAL HIGH (ref 15–41)
Albumin: 3.9 g/dL (ref 3.5–5.0)
Alkaline Phosphatase: 80 U/L (ref 38–126)
Anion gap: 6 (ref 5–15)
BUN: 25 mg/dL — ABNORMAL HIGH (ref 8–23)
CO2: 26 mmol/L (ref 22–32)
Calcium: 9.2 mg/dL (ref 8.9–10.3)
Chloride: 106 mmol/L (ref 98–111)
Creatinine, Ser: 1.47 mg/dL — ABNORMAL HIGH (ref 0.61–1.24)
GFR, Estimated: 47 mL/min — ABNORMAL LOW (ref >60)
Glucose, Bld: 118 mg/dL — ABNORMAL HIGH (ref 70–99)
Potassium: 4.2 mmol/L (ref 3.5–5.1)
Sodium: 138 mmol/L (ref 135–145)
Total Bilirubin: 0.8 mg/dL (ref 0.3–1.2)
Total Protein: 6.8 g/dL (ref 6.5–8.1)

## 2022-08-08 LAB — SALICYLATE LEVEL: Salicylate Lvl: <7 mg/dL — ABNORMAL LOW (ref 7.0–30.0)

## 2022-08-08 LAB — ETHANOL: Alcohol, Ethyl (B): <10 mg/dL (ref <10)

## 2022-08-08 LAB — ACETAMINOPHEN LEVEL: Acetaminophen (Tylenol), Serum: <10 ug/mL — ABNORMAL LOW (ref 10–30)

## 2022-08-08 MED ORDER — LORAZEPAM 2 MG PO TABS: 2.0000 mg | ORAL_TABLET | Freq: Two times a day (BID) | ORAL | Status: DC | PRN

## 2022-08-08 MED ORDER — HALOPERIDOL LACTATE 5 MG/ML IJ SOLN: 5.0000 mg | Freq: Two times a day (BID) | INTRAMUSCULAR | Status: AC | PRN

## 2022-08-08 MED ORDER — DIPHENHYDRAMINE HCL 50 MG/ML IJ SOLN: 50.0000 mg | Freq: Two times a day (BID) | INTRAMUSCULAR | Status: AC | PRN

## 2022-08-08 MED ORDER — HALOPERIDOL 5 MG PO TABS: 5.0000 mg | ORAL_TABLET | Freq: Two times a day (BID) | ORAL | Status: AC | PRN

## 2022-08-08 MED ORDER — LORAZEPAM 2 MG/ML IJ SOLN
2.0000 mg | Freq: Two times a day (BID) | INTRAMUSCULAR | Status: DC | PRN
  Administered 2022-08-08: 2 mg via INTRAMUSCULAR
  Filled 2022-08-08: qty 1

## 2022-08-08 MED ORDER — DIPHENHYDRAMINE HCL 25 MG PO CAPS: 50.0000 mg | ORAL_CAPSULE | Freq: Two times a day (BID) | ORAL | Status: AC | PRN

## 2022-08-08 NOTE — ED Provider Notes (Addendum)
[facility] Provider Note    Event Date/Time   First MD Initiated Contact with Patient 08/08/22 1211     (approximate)   History   Psychiatric Evaluation   HPI  Terry Sloan is a 82 y.o. male   Past medical history of, hyperlipidemia, prior stroke who presents to the emergency department from his dementia facility with aggressive behavior violent behavior lashing out against staff and being physically aggressive throwing chairs.  Demented disoriented at baseline and has no acute medical complaints looks comfortable.  Normal vital signs.  Was placed under IVC by his facility prior to coming to the emergency department.  External Medical Documents Reviewed: Emergency department visit dated February 2024 with aggressive behavior      Physical Exam   Triage Vital Signs: ED Triage Vitals [08/08/22 1205]  Enc Vitals Group     BP 113/76     Pulse Rate 79     Resp 19     Temp 98.4 F (36.9 C)     Temp Source Oral     SpO2 96 %     Weight 178 lb 9.2 oz (81 kg)     Height      Head Circumference      Peak Flow      Pain Score 0     Pain Loc      Pain Edu?      Excl. in [place]?     Most recent vital signs: Vitals:   08/08/22 1205 08/08/22 1245  BP: 113/76   Pulse: 79   Resp: 19   Temp: 98.4 F (36.9 C)   SpO2: 96% 96%    General: Awake, no distress.  CV:  Good peripheral perfusion.  Resp:  Normal effort.  Abd:  No distention.  Other:  Awake alert comfortable appearing no acute distress normal vital signs moving all extremities neck supple full range of motion no obvious signs of trauma lungs clear abdomen soft and nontender skin appears warm and well-perfused   ED Results / Procedures / Treatments   Labs (all labs ordered are listed, but only abnormal results are displayed) Labs Reviewed  COMPREHENSIVE METABOLIC PANEL - Abnormal; Notable for the following components:      Result Value   Glucose, Bld 118 (*)    BUN 25 (*)     Creatinine, Ser 1.47 (*)    AST 43 (*)    GFR, Estimated 47 (*)    All other components within normal limits  SALICYLATE LEVEL - Abnormal; Notable for the following components:   Salicylate Lvl [id number] (*)    All other components within normal limits  ACETAMINOPHEN LEVEL - Abnormal; Notable for the following components:   Acetaminophen (Tylenol), Serum <10 (*)    All other components within normal limits  CBC - Abnormal; Notable for the following components:   RBC 3.90 (*)    Hemoglobin 12.3 (*)    HCT 36.4 (*)    All other components within normal limits  ETHANOL  URINE DRUG SCREEN, QUALITATIVE ([facility] ONLY)     I ordered and reviewed the above labs they are notable for fattening is 1.4 at baseline negative acetaminophen and salicylate levels and baseline hemoglobin of 12.3   PROCEDURES:  Critical Care performed: No  Procedures   MEDICATIONS ORDERED IN ED: Medications - No data to display   IMPRESSION / MDM / [place] / ED COURSE  I reviewed the triage vital signs and the nursing  notes.                                Patient's presentation is most consistent with acute presentation with potential threat to life or bodily function.  Differential diagnosis includes, but is not limited to, dementia with behavioral disturbance, electrolyte derangements, toxicologic, trauma or infectious   The patient is on the cardiac monitor to evaluate for evidence of arrhythmia and/or significant heart rate changes.  MDM: With dementia and aggressive behavior similar occurrence back in February, now is calm disoriented no acute complaints with a normal exam and basic labs toxicologic labs within normal limits.  Psychiatric evaluation.  IVC by his facility   Psychiatry has cleared this patient, remains under IVC for TOC placement as his facility will not take him back.      FINAL CLINICAL IMPRESSION(S) / ED DIAGNOSES   Final diagnoses:  Dementia with behavioral  disturbance ([place])     Rx / DC Orders   ED Discharge Orders     None        Note:  This document was prepared using Dragon voice recognition software and may include unintentional dictation errors.    Lucillie Garfinkel, MD 08/08/22 1328    Lucillie Garfinkel, MD 08/08/22 [phone]
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
## 2022-08-08 NOTE — ED Notes (Signed)
Pt getting anxious and restless constantly wanting to get up and walk around trying to redirect but only lasting a couple minutes before needing it more and more frequently.
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
## 2022-08-08 NOTE — ED Notes (Signed)
Patient restless in bed wanting to get up and go home redirected each time and he will lay down.

## 2022-08-08 NOTE — ED Triage Notes (Addendum)
Pt arrived via [place]. Pt is a resident at [facility] health care center. Pt was IVC'd by them for suffering from anxiety and depression and is very combative with staff and not redirectable. Pt has a hx of Dementia with behavior disturbances. Pt is calm in triage now and acting appropriately.

## 2022-08-08 NOTE — ED Notes (Addendum)
2 of the NT's attempted to get EKG but unable to get one at this time due to movement of patient. Will try again.

## 2022-08-08 NOTE — ED Notes (Signed)
INVOLUNTARY with consult complete awaiting dispo

## 2022-08-08 NOTE — ED Notes (Signed)
INVOLUNTARY with all papers on chart/awaiting TTS/PSYCH consult ?

## 2022-08-08 NOTE — ED Notes (Signed)
Pt given snack but pt refused food.

## 2022-08-08 NOTE — ED Notes (Signed)
Pt provided dinner tray.

## 2022-08-09 DIAGNOSIS — F03918 Unspecified dementia, unspecified severity, with other behavioral disturbance: Secondary | ICD-10-CM | POA: Diagnosis not present

## 2022-08-09 MED ORDER — OLANZAPINE 10 MG PO TABS: 10.0000 mg | ORAL_TABLET | Freq: Every day | ORAL | Status: DC

## 2022-08-09 MED ORDER — HALOPERIDOL LACTATE 5 MG/ML IJ SOLN
5.0000 mg | Freq: Two times a day (BID) | INTRAMUSCULAR | Status: DC | PRN
  Administered 2022-08-09 – 2022-09-04 (×19): 5 mg via INTRAMUSCULAR
  Filled 2022-08-09 (×19): qty 1

## 2022-08-09 MED ORDER — ACETAMINOPHEN 325 MG PO TABS
650.0000 mg | ORAL_TABLET | Freq: Four times a day (QID) | ORAL | Status: DC | PRN
  Administered 2022-08-11 – 2022-09-01 (×8): 650 mg via ORAL
  Filled 2022-08-09 (×8): qty 2

## 2022-08-09 MED ORDER — TAMSULOSIN HCL 0.4 MG PO CAPS
0.4000 mg | ORAL_CAPSULE | Freq: Every day | ORAL | Status: DC
  Administered 2022-08-09 – 2022-11-07 (×82): 0.4 mg via ORAL
  Filled 2022-08-09 (×85): qty 1

## 2022-08-09 MED ORDER — DONEPEZIL HCL 5 MG PO TABS
5.0000 mg | ORAL_TABLET | Freq: Every day | ORAL | Status: DC
  Administered 2022-08-09 – 2022-11-08 (×84): 5 mg via ORAL
  Filled 2022-08-09 (×84): qty 1

## 2022-08-09 MED ORDER — OLANZAPINE 5 MG PO TABS: 15.0000 mg | ORAL_TABLET | Freq: Every evening | ORAL | Status: DC

## 2022-08-09 MED ORDER — LORAZEPAM 1 MG PO TABS: 1.0000 mg | ORAL_TABLET | Freq: Four times a day (QID) | ORAL | Status: DC | PRN

## 2022-08-09 MED ORDER — PRAVASTATIN SODIUM 20 MG PO TABS
40.0000 mg | ORAL_TABLET | Freq: Every day | ORAL | Status: DC
  Administered 2022-08-09 – 2022-11-07 (×89): 40 mg via ORAL
  Filled 2022-08-09 (×90): qty 2

## 2022-08-09 MED ORDER — DIVALPROEX SODIUM 250 MG PO DR TAB
250.0000 mg | DELAYED_RELEASE_TABLET | Freq: Two times a day (BID) | ORAL | Status: DC
  Administered 2022-08-09 – 2022-09-17 (×77): 250 mg via ORAL
  Filled 2022-08-09 (×79): qty 1

## 2022-08-09 MED ORDER — OLANZAPINE 7.5 MG PO TABS
15.0000 mg | ORAL_TABLET | Freq: Two times a day (BID) | ORAL | Status: DC
  Administered 2022-08-09 – 2022-09-17 (×76): 15 mg via ORAL
  Filled 2022-08-09 (×5): qty 1
  Filled 2022-08-09 (×2): qty 2
  Filled 2022-08-09 (×3): qty 1
  Filled 2022-08-09: qty 2
  Filled 2022-08-09: qty 1
  Filled 2022-08-09: qty 2
  Filled 2022-08-09 (×6): qty 1
  Filled 2022-08-09: qty 2
  Filled 2022-08-09 (×2): qty 1
  Filled 2022-08-09: qty 2
  Filled 2022-08-09: qty 1
  Filled 2022-08-09: qty 2
  Filled 2022-08-09 (×5): qty 1
  Filled 2022-08-09: qty 2
  Filled 2022-08-09 (×4): qty 1
  Filled 2022-08-09: qty 2
  Filled 2022-08-09 (×3): qty 1
  Filled 2022-08-09: qty 2
  Filled 2022-08-09 (×2): qty 1
  Filled 2022-08-09: qty 2
  Filled 2022-08-09 (×3): qty 1
  Filled 2022-08-09: qty 2
  Filled 2022-08-09 (×3): qty 1
  Filled 2022-08-09 (×4): qty 2
  Filled 2022-08-09 (×2): qty 1
  Filled 2022-08-09: qty 2
  Filled 2022-08-09 (×5): qty 1
  Filled 2022-08-09 (×2): qty 2
  Filled 2022-08-09 (×4): qty 1
  Filled 2022-08-09 (×3): qty 2
  Filled 2022-08-09 (×5): qty 1

## 2022-08-09 MED ORDER — MELATONIN 5 MG PO TABS
5.0000 mg | ORAL_TABLET | Freq: Every day | ORAL | Status: DC
  Administered 2022-08-09 – 2022-09-05 (×28): 5 mg via ORAL
  Filled 2022-08-09 (×28): qty 1

## 2022-08-09 NOTE — ED Notes (Signed)
Pt was able to take capsule whole with vanilla ice cream. Pt calm and cooperative.

## 2022-08-09 NOTE — ED Notes (Signed)
Patient was changed and reposition in bed. Patient states " I can not get comfortable."

## 2022-08-09 NOTE — ED Notes (Signed)
Requested home meds re-ordered as appropriate to [facility].

## 2022-08-09 NOTE — ED Notes (Signed)
Pt was unable to take pills whole as kept shifting them around in cheek pockets so crushed meds and gave them in apple sauce; pt tolerated this well.

## 2022-08-09 NOTE — ED Notes (Signed)
Pt given snack and apple juice at this time.

## 2022-08-09 NOTE — ED Notes (Signed)
IVC  psych clear TOC placement

## 2022-08-09 NOTE — ED Notes (Signed)
Patient keeps taking off his pants and is also, restless.

## 2022-08-09 NOTE — ED Notes (Signed)
Pt did not eat breakfast. Pt currently still resting. arouses when RN took VS, but then went back to sleep. Pt clean and dry at this  time.

## 2022-08-09 NOTE — ED Notes (Signed)
Breakfast placed at beside.

## 2022-08-09 NOTE — Telephone Encounter (Signed)
She is having to hold off on this for now, he got bad after his visit and she had to call EMS he is currently at a rehab facility in [place] and has a SW helping w/ placement, he will need placement into a memory unit.

## 2022-08-09 NOTE — ED Notes (Signed)
Patient continues to need constant redirection.

## 2022-08-09 NOTE — ED Notes (Signed)
Pt given dinner tray and drink. Pt appreciative and denies any other current needs.

## 2022-08-09 NOTE — ED Notes (Signed)
Pt continues to sleep; chest rise and fall noted.  

## 2022-08-09 NOTE — ED Provider Notes (Signed)
-----------------------------------------   6:48 AM on 08/09/2022 -----------------------------------------   Blood pressure 100/67, pulse 84, temperature 98.3 F (36.8 C), temperature source Oral, resp. rate 19, weight 81 kg, SpO2 98 %.  The patient is calm and cooperative at this time.  There have been no acute events since the last update.  Awaiting disposition plan from [facility] team.   Hinda Kehr, MD 08/09/22 [phone]

## 2022-08-09 NOTE — ED Notes (Signed)
Pt urinated in urinal at this time 249m

## 2022-08-10 NOTE — ED Notes (Signed)
Patient is vol pending placement 

## 2022-08-10 NOTE — ED Notes (Signed)
Placed brief on pt with a pair of mesh panties over them to keep in place

## 2022-08-10 NOTE — ED Notes (Signed)
LUNCH TRAY GIVEN. 

## 2022-08-10 NOTE — ED Notes (Signed)
Pt very fidgety, constantly adjusting and readjusting blankets.  Pt refuses to keep brief and pants on, but remains clean and dry.  Activity mat given to pt.  Pt has not slept all night.  Water given as per his request

## 2022-08-10 NOTE — ED Notes (Signed)
Assisted pt to the restroom. Pt given new scrub pants, underwear and socks.

## 2022-08-10 NOTE — ED Notes (Signed)
Assisted pt to the restroom. New beh health pants and linen change provided. Pt is currently sitting up in the recliner.

## 2022-08-10 NOTE — ED Notes (Signed)
Pt sleeping comfortably at this time, no distress noted

## 2022-08-10 NOTE — ED Notes (Signed)
IVC/Pending TOC placement

## 2022-08-10 NOTE — ED Notes (Signed)
Pt 2 person assist to bathroom.

## 2022-08-10 NOTE — ED Notes (Signed)
Daughter given update per request.

## 2022-08-10 NOTE — ED Notes (Addendum)
This NT assisted pt to the restroom. Pt given new scrub pants and underwear. Pt back in bed and appearing to be restless.

## 2022-08-11 DIAGNOSIS — F03918 Unspecified dementia, unspecified severity, with other behavioral disturbance: Secondary | ICD-10-CM | POA: Diagnosis not present

## 2022-08-11 MED ORDER — TRAZODONE HCL 50 MG PO TABS
50.0000 mg | ORAL_TABLET | Freq: Once | ORAL | Status: AC
  Administered 2022-08-11: 50 mg via ORAL
  Filled 2022-08-11: qty 1

## 2022-08-11 NOTE — ED Notes (Signed)
Meds crushed and given in ice cream

## 2022-08-11 NOTE — ED Notes (Signed)
Patient was asleep. Breakfast at bedside.

## 2022-08-11 NOTE — ED Notes (Signed)
Pt ambulated to bathroom with two-person assist. Pt voided and had a bowel movement. Pt ambulated back to bed.

## 2022-08-11 NOTE — ED Notes (Signed)
Patient ambulatory to bathroom w/ 2 assist, voided x1 without difficulty.

## 2022-08-11 NOTE — ED Notes (Signed)
Pt is given dinner tray and drink

## 2022-08-11 NOTE — ED Notes (Signed)
Pt c/o leg pain, PRN meds given

## 2022-08-11 NOTE — ED Provider Notes (Signed)
Emergency Medicine Observation Re-evaluation Note  Terry Sloan is a 82 y.o. male, seen on rounds today.  Pt initially presented to the ED for complaints of Psychiatric Evaluation  Currently, the patient is resting  Physical Exam  Blood pressure 117/70, pulse 76, temperature 98.7 F (37.1 C), temperature source Oral, resp. rate 18, weight 81 kg, SpO2 97 %.     Plan   Current plan is to continue to wait for placement Patient is under full IVC at this time.   Vanessa Agency, MD 08/11/22 [phone]

## 2022-08-11 NOTE — ED Notes (Signed)
Pt given phone and called daughter

## 2022-08-11 NOTE — ED Notes (Signed)
Patient ambulatory to bathroom with one assist.

## 2022-08-11 NOTE — ED Notes (Signed)
Pt given snack of apple sauce, graham crackers and sprite at this time. Pt sitting up eating in bed.

## 2022-08-12 NOTE — ED Notes (Addendum)
Pt family here to visit with patient. Supervised by patient relations [place]

## 2022-08-12 NOTE — ED Notes (Signed)
Lunch given.

## 2022-08-12 NOTE — ED Notes (Signed)
Ambulatory with walker AND staff assistance to and from bathroom. Pt now resting in recliner.

## 2022-08-12 NOTE — ED Provider Notes (Incomplete)
    08/12/2022    8:34 PM 08/12/2022    8:17 AM 08/11/2022    8:01 PM  Vitals with BMI  Systolic 888 280 034  Diastolic 68 71 62  Pulse 78 86 90    Patient is sitting on the edge of the bed and appears comfortable.  No complaints

## 2022-08-12 NOTE — ED Notes (Signed)
RN fed patient cup of applesauce with meds.

## 2022-08-12 NOTE — ED Notes (Signed)
Family requesting to speak with social work. RN reached out to A. Holcolmb LCSW via secure chat and then referred to [place]. A, LCSW who states that he has no updates for the family. RN provided daughter, Reginia Forts number to LCSW both and they are aware of daughter's request for phone call. Daughter informed.

## 2022-08-12 NOTE — ED Notes (Signed)
Assisted to and from bathroom with walker. Now eating breakfast in bed.

## 2022-08-12 NOTE — ED Notes (Signed)
IVC  TOC  PLACEMENT

## 2022-08-12 NOTE — ED Provider Notes (Signed)
    08/12/2022    8:34 PM 08/12/2022    8:17 AM 08/11/2022    8:01 PM  Vitals with BMI  Systolic 060 045 997  Diastolic 68 71 62  Pulse 78 86 90    Patient is sitting on the edge of the bed and appears comfortable.  Per nursing has not been sleeping at all.  I ordered 50 mg of trazodone.  He is already prescribed melatonin.  Patient has no complaints at this time.  He is pending social work placement.   Rada Hay, MD 08/13/22 [phone]

## 2022-08-12 NOTE — ED Notes (Addendum)
Supervised visit completed.

## 2022-08-13 DIAGNOSIS — F03918 Unspecified dementia, unspecified severity, with other behavioral disturbance: Secondary | ICD-10-CM | POA: Diagnosis not present

## 2022-08-13 MED ORDER — TRAZODONE HCL 50 MG PO TABS: 50.0000 mg | ORAL_TABLET | Freq: Every day | ORAL | Status: DC

## 2022-08-13 MED ORDER — TRAZODONE HCL 50 MG PO TABS
50.0000 mg | ORAL_TABLET | Freq: Once | ORAL | Status: AC
  Administered 2022-08-13: 50 mg via ORAL
  Filled 2022-08-13: qty 1

## 2022-08-13 NOTE — ED Notes (Signed)
Pt moved to room 1h.  Sitter with pt.  Report off to [employer].

## 2022-08-13 NOTE — ED Notes (Signed)
Taking over patient care. Patient is resting comfortable in bed with sitter at bedside. Patient is calm and cooperative at this time.

## 2022-08-13 NOTE — ED Notes (Signed)
Pt complaining of headache, prn tylenol given. See [facility]

## 2022-08-13 NOTE — ED Notes (Signed)
This NT and Mayra (NT) gave pt a wash up. Pt was able to stand up and walk to the restroom with the assistance of NT. Pt was given a new shirt, depend, pants and socks. Pt walked back to the bed with no issues. Pt is now sitting in bed with NT at bedside.

## 2022-08-13 NOTE — ED Notes (Signed)
Patient rounded on. Patient is sitting in bed eating dinner. Patient denies other needs at this time. Sitter is at bedside.

## 2022-08-13 NOTE — ED Notes (Signed)
Sitter wit pt.  Pt sitting up, alert.

## 2022-08-13 NOTE — ED Notes (Signed)
ivc/toc placement.Marland Kitchen

## 2022-08-14 DIAGNOSIS — F03918 Unspecified dementia, unspecified severity, with other behavioral disturbance: Secondary | ICD-10-CM | POA: Diagnosis not present

## 2022-08-14 MED ORDER — LORAZEPAM 2 MG/ML IJ SOLN
2.0000 mg | Freq: Once | INTRAMUSCULAR | Status: AC
  Administered 2022-08-14: 2 mg via INTRAMUSCULAR
  Filled 2022-08-14: qty 1

## 2022-08-14 NOTE — ED Notes (Signed)
Ivc /pending TOC placement 

## 2022-08-14 NOTE — ED Notes (Signed)
Pt states he has a headache 

## 2022-08-14 NOTE — ED Notes (Signed)
Pt bed linens and gown changed

## 2022-08-14 NOTE — ED Notes (Signed)
Patient was moved to room 26. Patient was able to ambulate to the hospital bed with assistance.

## 2022-08-14 NOTE — ED Notes (Signed)
Per  Barbaraann Share  NP  let  IVC papers  run out on  08/15/22  pt  TOC   placement

## 2022-08-14 NOTE — ED Provider Notes (Signed)
Emergency Medicine Observation Re-evaluation Note  Physical Exam   BP 109/68   Pulse 82   Temp 98.6 F (37 C)   Resp 15   Wt 78.2 kg   SpO2 97%   BMI 24.74 kg/m   Patient appears in no acute distress.  ED Course / MDM   No reported events during my shift at the time of this note.   Pt is awaiting dispo from SW   Lucillie Garfinkel MD    Lucillie Garfinkel, MD 08/14/22 [phone]

## 2022-08-14 NOTE — ED Notes (Signed)
Dinner tray provided for pt. Pt consumed 100% of food.

## 2022-08-14 NOTE — ED Notes (Signed)
Pt provided lunch tray. Pt ate 50% of tray. Waste discarded.

## 2022-08-14 NOTE — ED Notes (Signed)
Contacted Eric with Social Work per request of daughter for update of pt.

## 2022-08-15 DIAGNOSIS — F03918 Unspecified dementia, unspecified severity, with other behavioral disturbance: Secondary | ICD-10-CM | POA: Diagnosis not present

## 2022-08-15 NOTE — ED Notes (Addendum)
Pt agitated and trying to climb out of the bed. Tech has used the [employer] apron, tried to make conversation, Social research officer, government. Nothing is working. RN made aware.

## 2022-08-15 NOTE — ED Notes (Signed)
Attempted to walk pt to the bathroom with RN. RN had to get wheel chair  to take pt the rest of the way. Pt back in bed, attempting to climb out of bed. All safety precautions are being followed.

## 2022-08-15 NOTE — ED Notes (Signed)
Pt uninterested in eating when awake. Pt became restless due to soiled bed and brief. Pt changed by this tech. Pt cooperative and followed directions without difficulty. This tech will continue to sit at bedside.

## 2022-08-15 NOTE — ED Notes (Signed)
IVC/Pending TOC Placement 

## 2022-08-15 NOTE — ED Notes (Signed)
Pt was wet, brief and chucks pad changed.

## 2022-08-15 NOTE — ED Provider Notes (Signed)
    08/14/2022   10:17 AM 08/14/2022   12:38 AM 08/13/2022    6:49 PM  Vitals with BMI  Weight  172 lbs 6 oz   BMI  88.89   Systolic 169  450  Diastolic 86  68  Pulse 76  82    Patient did require some sedation during shift before mine.  He has been resting comfortably throughout my shift.  He is awaiting social work placement.   Rada Hay, MD 08/15/22 [phone]

## 2022-08-15 NOTE — ED Notes (Signed)
Lunch tray provided for pt

## 2022-08-15 NOTE — ED Notes (Signed)
Pt repositioned in the bed. Brief dry. Pt is resting.

## 2022-08-15 NOTE — ED Notes (Signed)
Pt urinated on self. Pt cleaned by this Probation officer with help from [employer], [place]. Clean brief placed on pt and bed linens changed. Pt provided with warm blankets.

## 2022-08-15 NOTE — ED Notes (Signed)
ivc/pending toc placement.. 

## 2022-08-15 NOTE — ED Notes (Signed)
Pt ambulated to restroom with walker. Sitter present. Pt had a BM. Pt is back in bed resting at this time.

## 2022-08-16 ENCOUNTER — Other Ambulatory Visit: Payer: Self-pay

## 2022-08-16 ENCOUNTER — Encounter: Payer: Self-pay | Admitting: Emergency Medicine

## 2022-08-16 DIAGNOSIS — F03918 Unspecified dementia, unspecified severity, with other behavioral disturbance: Secondary | ICD-10-CM | POA: Diagnosis not present

## 2022-08-16 DIAGNOSIS — R509 Fever, unspecified: Secondary | ICD-10-CM | POA: Diagnosis not present

## 2022-08-16 MED ORDER — LORAZEPAM 2 MG PO TABS
2.0000 mg | ORAL_TABLET | Freq: Once | ORAL | Status: AC
  Administered 2022-08-16: 2 mg via ORAL
  Filled 2022-08-16: qty 1

## 2022-08-16 NOTE — TOC Progression Note (Addendum)
Transition of Care [facility]) - Progression Note    Patient Details  Name: Terry Sloan MRN: [id number] Date of Birth: May 10, 1941  Transition of Care [facility]) CM/SW Contact  Ross Ludwig, [place] Phone Number: 08/16/2022, 12:15pm  Clinical Narrative:     CSW attempted to contact [facility] again left message waiting for call back.  Expected Discharge Plan:  (TBD) Barriers to Discharge: Continued Medical Work up  Expected Discharge Plan and Services                                               Social Determinants of Health (SDOH) Interventions SDOH Screenings   Food Insecurity: No Food Insecurity (07/23/2022)  Housing: Low Risk  (07/23/2022)  Transportation Needs: No Transportation Needs (07/23/2022)  Utilities: Not At Risk (07/23/2022)  Alcohol Screen: Low Risk  (07/23/2022)  Depression (PHQ2-9): Low Risk  (07/23/2022)  Recent Concern: Depression (PHQ2-9) - Medium Risk (06/17/2022)  Financial Resource Strain: Low Risk  (07/23/2022)  Physical Activity: Insufficiently Active (07/23/2022)  Social Connections: Socially Isolated (07/23/2022)  Stress: No Stress Concern Present (07/23/2022)  Tobacco Use: Medium Risk (08/16/2022)    Readmission Risk Interventions     No data to display

## 2022-08-16 NOTE — ED Notes (Signed)
Pt remains fidgeting in bed. Pt also continues to attempt getting out of bed but is still redirectable at this time.

## 2022-08-16 NOTE — ED Notes (Signed)
Pt ambulated to restroom with walker. EDT, Paige present. Pt did not void or have a BM.

## 2022-08-16 NOTE — NC FL2 (Cosign Needed Addendum)
[place] LEVEL OF CARE FORM     IDENTIFICATION  Patient Name: Terry Sloan Birthdate: 07-04-1940 Sex: male Admission Date (Current Location): 08/08/2022  [facility] and [place] Number:  Engineering geologist and Address:  [facility], [address] [place], [place] [address]      Provider Number: [id number]  Attending Physician Name and Address:  No att. providers found  Relative Name and Phone Number:  Madelyn Flavors Daughter   [phone]  Sahibjot, [place]   [phone]    Current Level of Care: Hospital Recommended Level of Care: [place] Prior Approval Number:    Date Approved/Denied:   PASRR Number: [id number]  Discharge Plan: SNF    Current Diagnoses: Patient Active Problem List   Diagnosis Date Noted   Dementia with behavioral disturbance ([place]) 07/27/2022   OAB (overactive bladder) 01/25/2019   BPH (benign prostatic hyperplasia) 07/27/2018   History of total left knee replacement 04/17/2017   History of TIA (transient ischemic attack) 10/16/2016   Aphasia 10/16/2016   Anxiety and depression 10/16/2016   PVD (peripheral vascular disease) ([place]) 09/23/2016   Mixed hyperlipidemia 09/23/2016   Weight loss, unintentional 09/23/2016    Orientation RESPIRATION BLADDER Height & Weight     Self  Normal Incontinent Weight: 172 lb 6.4 oz (78.2 kg) Height:     BEHAVIORAL SYMPTOMS/MOOD NEUROLOGICAL BOWEL NUTRITION STATUS      Continent, Incontinent Diet  AMBULATORY STATUS COMMUNICATION OF NEEDS Skin   Limited Assist Verbally Normal                       Personal Care Assistance Level of Assistance  Bathing, Feeding, Dressing Bathing Assistance: Limited assistance Feeding assistance: Independent Dressing Assistance: Limited assistance     Functional Limitations Info  Sight, Hearing, Speech Sight Info: Adequate Hearing Info: Adequate Speech Info: Adequate    SPECIAL CARE FACTORS FREQUENCY  PT (By  licensed PT), OT (By licensed OT)     PT Frequency: Minimum 5x a week OT Frequency: Minimum 5x a week            Contractures Contractures Info: Not present    Additional Factors Info  Code Status, Allergies, Psychotropic Code Status Info: Full code Allergies Info: Celebrex (Celecoxib) Psychotropic Info: divalproex (DEPAKOTE) DR tablet 250 mg, OLANZapine (ZYPREXA) tablet 15 mg         Current Medications (08/16/2022):  This is the current hospital active medication list Current Facility-Administered Medications  Medication Dose Route Frequency Provider Last Rate Last Admin   acetaminophen (TYLENOL) tablet 650 mg  650 mg Oral Q6H PRN Harvest Dark, MD   650 mg at 08/14/22 0911   divalproex (DEPAKOTE) DR tablet 250 mg  250 mg Oral Q12H Patrecia Pour, NP   250 mg at 08/16/22 2119   donepezil (ARICEPT) tablet 5 mg  5 mg Oral Daily Harvest Dark, MD   5 mg at 08/15/22 1000   haloperidol lactate (HALDOL) injection 5 mg  5 mg Intramuscular Q12H PRN Harvest Dark, MD   5 mg at 08/15/22 1619   melatonin tablet 5 mg  5 mg Oral QHS Harvest Dark, MD   5 mg at 08/16/22 2120   OLANZapine (ZYPREXA) tablet 15 mg  15 mg Oral BID Patrecia Pour, NP   15 mg at 08/16/22 2119   pravastatin (PRAVACHOL) tablet 40 mg  40 mg Oral Annamarie Dawley, MD   40 mg at 08/16/22 2120   tamsulosin [facility])  capsule 0.4 mg  0.4 mg Oral QPC supper Harvest Dark, MD   0.4 mg at 08/16/22 [id number]   Current Outpatient Medications  Medication Sig Dispense Refill   acetaminophen (TYLENOL) 325 MG tablet Take 2 tablets by mouth every 6 (six) hours as needed for mild pain or fever.     donepezil (ARICEPT ODT) 5 MG disintegrating tablet Take 5 mg by mouth daily.     haloperidol lactate (HALDOL) 5 MG/ML injection 5 mg once. For aggressive behavior     HYDROXYZINE PAMOATE PO Take 1 tablet by mouth every 6 (six) hours as needed (anxiety/agitation).     LORazepam (ATIVAN) 1 MG tablet Take 1 mg by  mouth every 6 (six) hours as needed for anxiety (anxiety/agitation).     melatonin 3 MG TABS tablet Take 1 tablet by mouth at bedtime.     OLANZapine (ZYPREXA) 10 MG tablet Take 1 tablet by mouth at bedtime.     OLANZapine (ZYPREXA) 15 MG tablet Take 1 tablet by mouth every evening.     pravastatin (PRAVACHOL) 40 MG tablet TAKE ONE (1) TABLET BY MOUTH EVERY DAY (Patient taking differently: Take 1 tablet by mouth at bedtime.) 90 tablet 1   tamsulosin (FLOMAX) 0.4 MG CAPS capsule Take 1 capsule (0.4 mg total) by mouth daily after supper. 90 capsule 3   multivitamin-lutein (OCUVITE-LUTEIN) CAPS capsule Take 1 capsule by mouth daily. (Patient not taking: Reported on 08/08/2022)     oxybutynin (DITROPAN-XL) 5 MG 24 hr tablet Take 5 mg by mouth daily. (Patient not taking: Reported on 08/08/2022)       Discharge Medications: Please see discharge summary for a list of discharge medications.  Relevant Imaging Results:  Relevant Lab Results:   Additional Information SS# [id number]  Ross Ludwig, LCSW

## 2022-08-16 NOTE — ED Notes (Signed)
Pt back in bed at this time. Pt provided with warm blankets.

## 2022-08-16 NOTE — ED Notes (Signed)
VOL/pending TOC placement 

## 2022-08-16 NOTE — ED Notes (Signed)
IVC papers expired 08/15/22 @ 930AM. Pt now voluntary.

## 2022-08-16 NOTE — TOC Progression Note (Addendum)
Transition of Care [facility]) - Progression Note    Patient Details  Name: Terry Sloan MRN: [id number] Date of Birth: [birth year]  Transition of Care [facility]) CM/SW Contact  Ross Ludwig, [place]  Phone Number: 08/16/2022, 11:01 PM  Clinical Narrative:     TOC received message from bedside nurse that patient's daughter was asking for updates on placement.  CSW informed bedside nurse that there are no updates on placement at this time, and CSW will update patient's daughter either later today or tomorrow with updates.  Expected Discharge Plan:  (TBD) Barriers to Discharge: Continued Medical Work up  Expected Discharge Plan and Services                                               Social Determinants of Health (SDOH) Interventions SDOH Screenings   Food Insecurity: No Food Insecurity (07/23/2022)  Housing: Low Risk  (07/23/2022)  Transportation Needs: No Transportation Needs (07/23/2022)  Utilities: Not At Risk (07/23/2022)  Alcohol Screen: Low Risk  (07/23/2022)  Depression (PHQ2-9): Low Risk  (07/23/2022)  Recent Concern: Depression (PHQ2-9) - Medium Risk (06/17/2022)  Financial Resource Strain: Low Risk  (07/23/2022)  Physical Activity: Insufficiently Active (07/23/2022)  Social Connections: Socially Isolated (07/23/2022)  Stress: No Stress Concern Present (07/23/2022)  Tobacco Use: Medium Risk (08/16/2022)    Readmission Risk Interventions     No data to display

## 2022-08-16 NOTE — ED Notes (Signed)
Pt stood up out of bed and pulled pants down. Pt had urinated in brief. Pt changed into a clean brief by this Probation officer and Therapist, sports, Eyvonne Mechanic. Pt redirected back into bed and provided with warm blankets.

## 2022-08-16 NOTE — ED Notes (Signed)
VOL  TOC  PLACEMENT 

## 2022-08-16 NOTE — ED Notes (Signed)
Assumed care of pt Pt asleep, VS were updated Sitter @ bedside.

## 2022-08-16 NOTE — ED Notes (Signed)
Bedding/Linens changed by this Tech.

## 2022-08-16 NOTE — ED Notes (Signed)
Pt is awake and fidgeting in bed at this time.

## 2022-08-16 NOTE — ED Notes (Addendum)
Sitter provided pt with a cookie and a cup of water. Pt ate approximately half of cookie.

## 2022-08-16 NOTE — TOC Progression Note (Signed)
Transition of Care [facility]) - Progression Note    Patient Details  Name: STACEY MATEJKA MRN: [id number] Date of Birth: August 25, 1940  Transition of Care [facility]) CM/SW Contact  Ross Ludwig, [place] Phone Number: 08/13/2022 4:35pm  Clinical Narrative:     CSW was informed that patient was from [facility].  CSW attempted to call [facility] admissions worker Ebony Hail.  CSW left a message waiting for call back to see if they can reconsider taking patient back at facility.   Expected Discharge Plan:  (TBD) Barriers to Discharge: Continued Medical Work up  Expected Discharge Plan and Services                                               Social Determinants of Health (SDOH) Interventions SDOH Screenings   Food Insecurity: No Food Insecurity (07/23/2022)  Housing: Low Risk  (07/23/2022)  Transportation Needs: No Transportation Needs (07/23/2022)  Utilities: Not At Risk (07/23/2022)  Alcohol Screen: Low Risk  (07/23/2022)  Depression (PHQ2-9): Low Risk  (07/23/2022)  Recent Concern: Depression (PHQ2-9) - Medium Risk (06/17/2022)  Financial Resource Strain: Low Risk  (07/23/2022)  Physical Activity: Insufficiently Active (07/23/2022)  Social Connections: Socially Isolated (07/23/2022)  Stress: No Stress Concern Present (07/23/2022)  Tobacco Use: Medium Risk (08/16/2022)    Readmission Risk Interventions     No data to display

## 2022-08-16 NOTE — ED Notes (Signed)
Pt urinated in brief. Pt cleaned and a clean brief placed on pt. 

## 2022-08-16 NOTE — TOC Progression Note (Addendum)
Transition of Care [facility]) - Progression Note    Patient Details  Name: Terry Sloan MRN: [id number] Date of Birth: [birth year]  Transition of Care [facility]) CM/SW Contact  Ross Ludwig, [place] Phone Number: 08/15/2022 12:30pm  Clinical Narrative:     Late entry  CSW received message that patient's daughter Terry Sloan wanted to speak to CSW to get an update on any bed offers for patient.  CSW contacted patient's daughter and talked to her about the challenges that TOC is facing trying to find placement for patient.  TOC discussed that during the previous hospitalization the [facility] team tried to find placement for patient and was only able to find [facility].  CSW informed her that due to his behaviors the SNFs are not always willing to take a chance to accept patient due to putting other patients at risk.  CSW informed her that the bed search can be started again at different facilities in the [place] area.  Patient's daughter stated that since patient has been in hospital he has not been in the hospital.  She asked what medications are being given to prevent behaviors from occurring.  CSW reviewed MAR and noticed that patient has been getting haldol injection daily.  CSW informed patient's daughter Terry Sloan that since patient has been getting haldol injection daily this is considered a chemical restraint and SNFs and ALFs are not able to accept chemical restraint through injection.  CSW informed patient's daughter that as long as patient is continuously getting haldol injections this will be a barrier for him to go to a SNF or ALF.  CSW told patient's daughter that [facility] team will continue looking for placement for patient, but to make her aware of what the barriers are going to be.  Patient also has to be sitter free for 24-48 hours before SNFs can accept patient.  CSW informed patient's daughter that patient is on the difficult to place list and [facility] team are working on trying to secure placement for patient.   TOC to continue to follow patient's progress throughout discharge planning.   Patient has been faxed out to SNFs awaiting bed offers.   Expected Discharge Plan:  (TBD) Barriers to Discharge: Continued Medical Work up  Expected Discharge Plan and Services                                               Social Determinants of Health (SDOH) Interventions SDOH Screenings   Food Insecurity: No Food Insecurity (07/23/2022)  Housing: Low Risk  (07/23/2022)  Transportation Needs: No Transportation Needs (07/23/2022)  Utilities: Not At Risk (07/23/2022)  Alcohol Screen: Low Risk  (07/23/2022)  Depression (PHQ2-9): Low Risk  (07/23/2022)  Recent Concern: Depression (PHQ2-9) - Medium Risk (06/17/2022)  Financial Resource Strain: Low Risk  (07/23/2022)  Physical Activity: Insufficiently Active (07/23/2022)  Social Connections: Socially Isolated (07/23/2022)  Stress: No Stress Concern Present (07/23/2022)  Tobacco Use: Medium Risk (08/16/2022)    Readmission Risk Interventions     No data to display

## 2022-08-16 NOTE — ED Notes (Signed)
Pt attempting to get out of bed. Is redirectable at this time.

## 2022-08-16 NOTE — ED Provider Notes (Signed)
-----------------------------------------   5:43 AM on 08/16/2022 -----------------------------------------   Blood pressure 114/62, pulse 74, temperature 98.1 F (36.7 C), temperature source Oral, resp. rate 18, weight 78.2 kg, SpO2 (!) 16 %.  The patient is calm and cooperative at this time.  There have been no acute events since the last update.  Awaiting disposition plan from Social Work team.   Paulette Blanch, MD 08/16/22 [phone]

## 2022-08-16 NOTE — TOC Progression Note (Signed)
Transition of Care [facility]) - Progression Note    Patient Details  Name: Terry Sloan MRN: [id number] Date of Birth: July 21, 1940  Transition of Care [facility]) CM/SW Contact  Ross Ludwig, [place] Phone Number: 08/16/2022, 10:54 PM  Clinical Narrative:    Patient on the DTP list.  Will continue to try to find placement for patient.   Expected Discharge Plan:  (TBD) Barriers to Discharge: Continued Medical Work up  Expected Discharge Plan and Services                                               Social Determinants of Health (SDOH) Interventions SDOH Screenings   Food Insecurity: No Food Insecurity (07/23/2022)  Housing: Low Risk  (07/23/2022)  Transportation Needs: No Transportation Needs (07/23/2022)  Utilities: Not At Risk (07/23/2022)  Alcohol Screen: Low Risk  (07/23/2022)  Depression (PHQ2-9): Low Risk  (07/23/2022)  Recent Concern: Depression (PHQ2-9) - Medium Risk (06/17/2022)  Financial Resource Strain: Low Risk  (07/23/2022)  Physical Activity: Insufficiently Active (07/23/2022)  Social Connections: Socially Isolated (07/23/2022)  Stress: No Stress Concern Present (07/23/2022)  Tobacco Use: Medium Risk (08/16/2022)    Readmission Risk Interventions     No data to display

## 2022-08-17 DIAGNOSIS — F03918 Unspecified dementia, unspecified severity, with other behavioral disturbance: Secondary | ICD-10-CM | POA: Diagnosis not present

## 2022-08-17 NOTE — ED Notes (Signed)
Patient swatting at staff and not calming down. Mitts placed on patient. Sitter at bedside.

## 2022-08-17 NOTE — ED Notes (Signed)
Patient is vol pending TOC placement 

## 2022-08-17 NOTE — ED Notes (Signed)
EDT checked pts brief. Brief clean and dry at this time. Safety sitter at bedside.

## 2022-08-17 NOTE — ED Notes (Signed)
Pt sat up in bed to facilitate safe eating. EDT fed bite sized pieces of his burger to pt, with a sip of his sweet tea in between bites. Pt able to chew small bites safely, but unable to bite into burger by himself. Pt ate half of his burger before stating he was "full". EDT will offer food again at a later time.   Pt still sitting up and safety mitts remain on pts hands. Finger width gap around the wrist of safety mitts to avoid over tightening. Pt talking to self at this time. Safety sitter at bedside.

## 2022-08-17 NOTE — ED Notes (Signed)
Pt seen attempting to get out of bed with both legs hanging off the side. Pt repositioned in bed by this EDT. Brief checked and was still clean and dry. EDT removed pts safety mitts. Not redness, swelling, or abrasions see on his wrists at this time. Bruises to the fingers on his let hand were noted. Possibly from pt banging his hands against the wall earlier in the day. Unknown when the bruises first appeared.  Pt covered up and said he wanted to rest. Pt made aware safety mitts could stay off as long as he does not try to hurt himself of ED staff. Pt aid "Okay". Sitter at bedside.

## 2022-08-17 NOTE — ED Notes (Signed)
Pt trying to get out of bed, redirectable at this time.

## 2022-08-17 NOTE — ED Notes (Signed)
This EDT assumed care of pt. Pt seen talking to self with safety mitts on both hands. Pts brief clean at this moment in time. Safety sitter remains at pts bedside.

## 2022-08-17 NOTE — ED Notes (Addendum)
Pt brief and bed linen changed.  

## 2022-08-17 NOTE — ED Notes (Signed)
Pt complaining of leg pain. Nurse notified.

## 2022-08-17 NOTE — ED Notes (Signed)
Patient becoming increasingly agitated with staff. Patient making multiple attempts to get out of bed. Multiple attempts at redirection with no success. Sitter unable to keep patient in bed. Multiple staff members at bedside attempt to calm patient and help back into bed.

## 2022-08-17 NOTE — ED Notes (Signed)
Pt brief changed.  

## 2022-08-17 NOTE — ED Notes (Signed)
This EDT removed pts safety mitts to obtain vital signs. No redness, soreness, or abrasions noted to wrists where safety mitts were. Pt pinched EDT in the forearm within 30 seconds of mitts being removed. Pt ignored verbal instruction and refused to let go of EDT's forearm.  Vitals obtained and safety mitts placed back on pt.   Pt becoming increasingly more restless and attempting to get out of bed. Pt assisted to stay in bed by this EDT. Safety sitter remains at bedside.

## 2022-08-17 NOTE — ED Provider Notes (Signed)
-----------------------------------------   6:22 AM on 08/17/2022 -----------------------------------------   Blood pressure (!) 189/101, pulse 88, temperature 98.1 F (36.7 C), temperature source Oral, resp. rate 16, weight 78.2 kg, SpO2 97 %.  The patient is calm and cooperative at this time.  There have been no acute events since the last update.  Awaiting disposition plan from [facility] team.   Hinda Kehr, MD 08/17/22 [phone]

## 2022-08-17 NOTE — ED Notes (Signed)
Patient is resting comfortably. 

## 2022-08-17 NOTE — ED Notes (Signed)
Patient resting awake in bed. Sitter at bedside. NAD noted.

## 2022-08-18 DIAGNOSIS — F03918 Unspecified dementia, unspecified severity, with other behavioral disturbance: Secondary | ICD-10-CM | POA: Diagnosis not present

## 2022-08-18 DIAGNOSIS — R509 Fever, unspecified: Secondary | ICD-10-CM | POA: Diagnosis not present

## 2022-08-18 MED ORDER — DIAZEPAM 5 MG PO TABS
5.0000 mg | ORAL_TABLET | Freq: Once | ORAL | Status: AC
  Administered 2022-08-18: 5 mg via ORAL
  Filled 2022-08-18: qty 1

## 2022-08-18 NOTE — ED Notes (Signed)
Pt making multiple attempts to exit the bed, pt not redirectable by staff members and making physical threats to staff.

## 2022-08-18 NOTE — ED Notes (Signed)
Patient was trying to get out of bed. Patient has been kicking his legs and swinging his hands.

## 2022-08-18 NOTE — ED Notes (Signed)
Pt urinated in brief. Pt cleaned and a clean brief placed on pt. 

## 2022-08-18 NOTE — ED Notes (Signed)
This tech as 1:1 Air cabin crew for this pt along with another pt.

## 2022-08-18 NOTE — ED Notes (Signed)
Patient has been attempting to get out of bed, been restless for several hours at this point MD notified.

## 2022-08-18 NOTE — ED Provider Notes (Signed)
-----------------------------------------   6:02 AM on 08/18/2022 -----------------------------------------   Blood pressure (!) 126/57, pulse 79, temperature (!) 97.5 F (36.4 C), temperature source Oral, resp. rate 15, weight 78.2 kg, SpO2 98 %.  The patient is calm and cooperative at this time.  There have been no acute events since the last update.  Awaiting disposition plan from case management/social work.    Irini Leet, Delice Bison, DO 08/18/22 [phone]

## 2022-08-18 NOTE — ED Notes (Signed)
Pt is resting in bed with eyes closed. Equal chest rise and fall noted, RR unlabored.

## 2022-08-18 NOTE — ED Notes (Signed)
Pt repeatedly attempting to get out of bed. This Probation officer has let RN know.

## 2022-08-19 DIAGNOSIS — F03918 Unspecified dementia, unspecified severity, with other behavioral disturbance: Secondary | ICD-10-CM | POA: Diagnosis not present

## 2022-08-19 LAB — URINALYSIS, W/ REFLEX TO CULTURE (INFECTION SUSPECTED)
Bilirubin Urine: NEGATIVE
Glucose, UA: NEGATIVE mg/dL
Hgb urine dipstick: NEGATIVE
Ketones, ur: NEGATIVE mg/dL
Leukocytes,Ua: NEGATIVE
Nitrite: NEGATIVE
Protein, ur: NEGATIVE mg/dL
Specific Gravity, Urine: 1.027 (ref 1.005–1.030)
Squamous Epithelial / HPF: NONE SEEN /HPF (ref 0–5)
pH: 5 (ref 5.0–8.0)

## 2022-08-19 NOTE — ED Notes (Signed)
Pt urinate in urinal. Pt clean and dry.

## 2022-08-19 NOTE — ED Notes (Signed)
Patient is resting comfortably. 

## 2022-08-19 NOTE — ED Notes (Signed)
Pt given applesauce and water.

## 2022-08-19 NOTE — ED Notes (Signed)
VOL TOC placement 

## 2022-08-19 NOTE — ED Notes (Signed)
Patient ate 100% of meal tray. Patient acting calm with sitter at bedside.

## 2022-08-19 NOTE — ED Notes (Signed)
Patient took meds crushed in ice cream without difficulties. Patient calm and cooperative with staff.

## 2022-08-19 NOTE — TOC Progression Note (Signed)
Transition of Care [facility]) - Progression Note    Patient Details  Name: Terry Sloan MRN: [id number] Date of Birth: [birth year]  Transition of Care [facility]) CM/SW Contact  Ross Ludwig,  Phone Number: 08/19/2022, 11:38 AM  Clinical Narrative:     Patient still does not have any bed offers at this time.  In order for SNF to accept patient, he needs to be out of mittens which is considered a restraint per SNF regulations, and he has to be sitter free for 24-48 hours.  Patient also can not be on Haldol injection PRN.  He would have to be scheduled tablet.  TOC to continue to follow patient's progress throughout discharge planning.  Expected Discharge Plan:  (TBD) Barriers to Discharge: Continued Medical Work up  Expected Discharge Plan and Services                                               Social Determinants of Health (SDOH) Interventions SDOH Screenings   Food Insecurity: No Food Insecurity (07/23/2022)  Housing: Low Risk  (07/23/2022)  Transportation Needs: No Transportation Needs (07/23/2022)  Utilities: Not At Risk (07/23/2022)  Alcohol Screen: Low Risk  (07/23/2022)  Depression (PHQ2-9): Low Risk  (07/23/2022)  Recent Concern: Depression (PHQ2-9) - Medium Risk (06/17/2022)  Financial Resource Strain: Low Risk  (07/23/2022)  Physical Activity: Insufficiently Active (07/23/2022)  Social Connections: Socially Isolated (07/23/2022)  Stress: No Stress Concern Present (07/23/2022)  Tobacco Use: Medium Risk (08/16/2022)    Readmission Risk Interventions     No data to display

## 2022-08-19 NOTE — ED Notes (Signed)
Patient ate 100% of meal tray. Patient tolerated well. Patient now resting in bed while being calm and cooperative with staff.

## 2022-08-19 NOTE — ED Notes (Signed)
Patient resting with eyes closed. Respirations even and unlabored. NAD noted. 

## 2022-08-19 NOTE — ED Provider Notes (Signed)
-----------------------------------------   8:25 AM on 08/19/2022 -----------------------------------------   Blood pressure 102/65, pulse 90, temperature 98.8 F (37.1 C), temperature source Oral, resp. rate 17, weight 78.2 kg, SpO2 97 %.  The patient is calm and cooperative at this time.  There have been no acute events since the last update.  Awaiting disposition plan from [facility] team.   Hinda Kehr, MD 08/19/22 [phone]

## 2022-08-19 NOTE — ED Notes (Signed)
Patient ambulating in unit with walker at NT. Patient calm and steady on feet with walker.

## 2022-08-20 DIAGNOSIS — R509 Fever, unspecified: Secondary | ICD-10-CM | POA: Diagnosis not present

## 2022-08-20 DIAGNOSIS — F03918 Unspecified dementia, unspecified severity, with other behavioral disturbance: Secondary | ICD-10-CM | POA: Diagnosis not present

## 2022-08-20 NOTE — ED Notes (Signed)
Pt asleep, sitter at bedside 

## 2022-08-20 NOTE — ED Notes (Signed)
Pt incontinent of urine. Pt cleaned with peri wipes and clean brief placed on pt.

## 2022-08-20 NOTE — ED Notes (Signed)
Patient noted to be agitated and wanting to get out of bed. Requiring constant redirection from sitter and nurse. RN obtained recliner and with assistance from sitter pt was transferred into recliner. Pt continues to require frequent redirection to remain in chair, but appears more comfortable than previously. Pt noted to be breathing unlabored with symmetric chest rise and fall. Sitter remains present and recliner is placed directly next to sitter.

## 2022-08-20 NOTE — ED Provider Notes (Signed)
-----------------------------------------   4:58 AM on 08/20/2022 -----------------------------------------   Blood pressure (!) 103/47, pulse 73, temperature 97.8 F (36.6 C), temperature source Oral, resp. rate 16, weight 78.2 kg, SpO2 93 %.  The patient is calm and cooperative at this time.  There have been no acute events since the last update.  Awaiting disposition plan from Social Work team.   Paulette Blanch, MD 08/20/22 [phone]

## 2022-08-20 NOTE — ED Notes (Signed)
Vol / pending TOC placement 

## 2022-08-20 NOTE — ED Notes (Signed)
Pt asleep; chest rise and fall noted; sitter remains with pt in 1Hall. Pt in NAD.

## 2022-08-20 NOTE — ED Notes (Signed)
Pt told this tech and tech Linus Orn that he had to use the restroom, both this Scientist, research (physical sciences) assisted pt with urinal. Tech tracy gave pt sandwich tray and ice cream. Pt is calm and cooperative at this time in chair

## 2022-08-21 DIAGNOSIS — F03918 Unspecified dementia, unspecified severity, with other behavioral disturbance: Secondary | ICD-10-CM | POA: Diagnosis not present

## 2022-08-21 DIAGNOSIS — R509 Fever, unspecified: Secondary | ICD-10-CM | POA: Diagnosis not present

## 2022-08-21 MED ORDER — LORAZEPAM 1 MG PO TABS
1.0000 mg | ORAL_TABLET | Freq: Once | ORAL | Status: AC
  Administered 2022-08-21: 1 mg via ORAL
  Filled 2022-08-21: qty 1

## 2022-08-21 NOTE — ED Notes (Signed)
Pt had snack time pt had vanilla ice cream and apple juice

## 2022-08-21 NOTE — ED Notes (Signed)
This NT completed a brief change and cleaned up the patient before dinner tray arrived. Dinner tray arrived shortly after and the patient ate 75%. Patient now resting and calm in the bed.

## 2022-08-21 NOTE — ED Notes (Signed)
Patient consumed 75% of lunch tray.

## 2022-08-21 NOTE — ED Notes (Signed)
Pt keeps trying to get out of the bed. Shaking the bed rails. Pt stated " I'm not staying in this "fucking" hospital. This EDT and Samantha (EDT) were able to get pt back in bed.

## 2022-08-21 NOTE — ED Notes (Signed)
Pt c/o headache. Tylenol administered.

## 2022-08-21 NOTE — ED Provider Notes (Signed)
-----------------------------------------   5:51 AM on 08/21/2022 -----------------------------------------   Blood pressure (!) 117/94, pulse 97, temperature 98 F (36.7 C), temperature source Oral, resp. rate 16, weight 78.2 kg, SpO2 95 %.  The patient is calm and cooperative at this time.  There have been no acute events since the last update.  Awaiting disposition plan from Social Work team.   Paulette Blanch, MD 08/21/22 [phone]

## 2022-08-21 NOTE — ED Notes (Signed)
Patient ambulated in hallway with one person assist.Tolerated well and now sitting in bed.

## 2022-08-21 NOTE — ED Notes (Signed)
Patient cleaned up after urinating in brief. Patient given a new scrub top and blankets as well as a brief change. Patient tolerated well and was cooperative. Patient now resting in bed.

## 2022-08-21 NOTE — ED Notes (Signed)
VOL  TOC  PLACEMENT 

## 2022-08-22 DIAGNOSIS — F03918 Unspecified dementia, unspecified severity, with other behavioral disturbance: Secondary | ICD-10-CM | POA: Diagnosis not present

## 2022-08-22 MED ORDER — OLANZAPINE 5 MG PO TBDP
10.0000 mg | ORAL_TABLET | Freq: Once | ORAL | Status: AC
  Administered 2022-08-22: 10 mg via ORAL
  Filled 2022-08-22: qty 2

## 2022-08-22 NOTE — ED Notes (Signed)
This NT and Samantha NT completed a brief change and cleaned up the patient after an episode of urinary incontinence, A full linen change was also completed. Patient tolerated well.

## 2022-08-22 NOTE — ED Notes (Signed)
Pt has been sleeping just about all day pt woke up once to be changed and to take med's. Pt slept through breakfast and slept through lunch. Pt"s lunch tray is in near range of the pt. Just in case pt wakes up prior to dinner and wants to eat. Pt is sounds asleep snoring with EDT at beside.

## 2022-08-22 NOTE — ED Notes (Signed)
Pt soiled the brief and chuck. EDT completed peri care on pt. EDT changed pt's brief and chuck. And gave pt two warm blankets. Pt is now sleeping with EDT by bedside.

## 2022-08-22 NOTE — ED Notes (Signed)
This tech took report from, [place], [place]- this tech 1:1 sitter.  Pt asleep at this time.

## 2022-08-22 NOTE — ED Notes (Signed)
This EDT assumed safety sitting role of this pt. Pt seen with a visibly soiled brief and restless in bed. EDT changed pts brief to promote a clean environment. Safety sitter siting within arms reach of pt at this time.

## 2022-08-22 NOTE — ED Notes (Signed)
Pt is adamant about getting out of bed, even after continuous redirection.

## 2022-08-22 NOTE — ED Notes (Signed)
Pts brief changed by this tech and Verdis Frederickson EDT.

## 2022-08-22 NOTE — ED Notes (Signed)
Patient continues to be extremely restless and attempting to get out of bed. Dr. Tamala Julian notified.

## 2022-08-22 NOTE — ED Notes (Signed)
VOL/TOC Placement 

## 2022-08-22 NOTE — ED Notes (Signed)
Pt awake and eating dinner at this time.

## 2022-08-22 NOTE — ED Provider Notes (Signed)
Emergency Medicine Observation Re-evaluation Note  Terry Sloan is a 82 y.o. male, currently boarding in the emergency department awaiting social work placement.  No acute events since last update  Physical Exam  BP 120/72 (BP Location: Left Arm)   Pulse 72   Temp 98 F (36.7 C) (Axillary)   Resp 14   Wt 78.2 kg   SpO2 98%   BMI 24.74 kg/m    ED Course / MDM   Patient had a urinalysis performed 3 days ago showing no concerning findings, no other recent lab work available for review.  Plan  Current plan is for placement to an appropriate living facility once available.  Social work is working with the patient.    Harvest Dark, MD 08/22/22 [phone]

## 2022-08-23 DIAGNOSIS — F03918 Unspecified dementia, unspecified severity, with other behavioral disturbance: Secondary | ICD-10-CM | POA: Diagnosis not present

## 2022-08-23 MED ORDER — LORAZEPAM 1 MG PO TABS
1.0000 mg | ORAL_TABLET | Freq: Once | ORAL | Status: AC
  Administered 2022-08-23: 1 mg via ORAL
  Filled 2022-08-23: qty 1

## 2022-08-23 NOTE — ED Notes (Signed)
Daughter and son will be back Monday to visit with patient.

## 2022-08-23 NOTE — ED Provider Notes (Signed)
-----------------------------------------   6:16 AM on 08/23/2022 -----------------------------------------   Blood pressure 120/72, pulse 72, temperature 98 F (36.7 C), temperature source Axillary, resp. rate 14, weight 78.2 kg, SpO2 98 %.  The patient is calm and cooperative at this time.  There have been no acute events since the last update.  Awaiting disposition plan from [facility] team.   Hinda Kehr, MD 08/23/22 [phone]

## 2022-08-23 NOTE — ED Notes (Signed)
Patient is resting comfortably. 

## 2022-08-23 NOTE — TOC Progression Note (Addendum)
Transition of Care [facility]) - Progression Note    Patient Details  Name: Terry Sloan MRN: [id number] Date of Birth: February 10, 1941  Transition of Care [facility]) CM/SW Contact  Ross Ludwig, [place] Phone Number: 08/23/2022, 8:16 PM  Clinical Narrative:     Patient still does not have any bed offers.  Patient does not have mittens anymore, but has gotten injections still.  SNFs can not take patient gettng haldol injection, continuing to work on placement.   Expected Discharge Plan:  (TBD) Barriers to Discharge: Continued Medical Work up  Expected Discharge Plan and Services                                               Social Determinants of Health (SDOH) Interventions SDOH Screenings   Food Insecurity: No Food Insecurity (07/23/2022)  Housing: Low Risk  (07/23/2022)  Transportation Needs: No Transportation Needs (07/23/2022)  Utilities: Not At Risk (07/23/2022)  Alcohol Screen: Low Risk  (07/23/2022)  Depression (PHQ2-9): Low Risk  (07/23/2022)  Recent Concern: Depression (PHQ2-9) - Medium Risk (06/17/2022)  Financial Resource Strain: Low Risk  (07/23/2022)  Physical Activity: Insufficiently Active (07/23/2022)  Social Connections: Socially Isolated (07/23/2022)  Stress: No Stress Concern Present (07/23/2022)  Tobacco Use: Medium Risk (08/16/2022)    Readmission Risk Interventions     No data to display

## 2022-08-23 NOTE — ED Notes (Signed)
VOL/TOC Placement 

## 2022-08-23 NOTE — ED Notes (Signed)
Pt brief changed.  

## 2022-08-24 LAB — URINALYSIS, ROUTINE W REFLEX MICROSCOPIC
Bilirubin Urine: NEGATIVE
Glucose, UA: NEGATIVE mg/dL
Hgb urine dipstick: NEGATIVE
Ketones, ur: 5 mg/dL — AB
Leukocytes,Ua: NEGATIVE
Nitrite: NEGATIVE
Protein, ur: NEGATIVE mg/dL
Specific Gravity, Urine: 1.014 (ref 1.005–1.030)
pH: 6 (ref 5.0–8.0)

## 2022-08-24 MED ORDER — ENSURE ENLIVE PO LIQD
237.0000 mL | Freq: Two times a day (BID) | ORAL | Status: DC
  Administered 2022-08-25 – 2022-11-08 (×125): 237 mL via ORAL

## 2022-08-24 NOTE — ED Notes (Signed)
Pt provided peri care and brief change by NT with this RN assisting.

## 2022-08-24 NOTE — ED Notes (Signed)
Pt seen rubbing his groin often. Stops when redirected. Pt urinating a lot and very often. More so than usual. Pts urine has a foul odor. RN notified.

## 2022-08-24 NOTE — ED Notes (Signed)
NT assisting pt with lunch tray. Pt dismisses most items from tray. Pt eating apple sauce.

## 2022-08-24 NOTE — ED Notes (Addendum)
NT assisted pt with breakfast tray. Per NT, pt didn't eat almost anything but he did finish his drink. NT now providing pt peri care. Pt cooperative.

## 2022-08-24 NOTE — ED Notes (Signed)
NT sitter currently assisting pt with peri care and a fresh set of briefs. Pt alert, calm and cooperative.

## 2022-08-24 NOTE — ED Notes (Signed)
Pt took meds with applesauce

## 2022-08-24 NOTE — ED Notes (Signed)
Patient is vol pending placement 

## 2022-08-24 NOTE — ED Notes (Signed)
vol/toc placement.. 

## 2022-08-24 NOTE — ED Notes (Signed)
This EDT assumed the role of safety sitting for this pt. Foul smelling urine was soaking the pt and all linens in the bed. Pt had 2 briefs on, which were both soaked. Pt also seen with safety mitts on both hands due to self-harming behavior seen by other ED staff earlier in the shift.   Full linen changed and pt was washed with foam incontinent cleanser from head to toe. New linens, chux, brief, shirt, pillow cases, and blankets provided. Pt repositioned in bed to promote a safe environment.  Safety mitts remain on pt at this time. Safety sitter at bedside.

## 2022-08-25 DIAGNOSIS — F03918 Unspecified dementia, unspecified severity, with other behavioral disturbance: Secondary | ICD-10-CM | POA: Diagnosis not present

## 2022-08-25 DIAGNOSIS — R509 Fever, unspecified: Secondary | ICD-10-CM | POA: Diagnosis not present

## 2022-08-25 LAB — LACTIC ACID, PLASMA: Lactic Acid, Venous: 0.9 mmol/L (ref 0.5–1.9)

## 2022-08-25 LAB — COMPREHENSIVE METABOLIC PANEL
ALT: 20 U/L (ref 0–44)
AST: 34 U/L (ref 15–41)
Albumin: 3.4 g/dL — ABNORMAL LOW (ref 3.5–5.0)
Alkaline Phosphatase: 83 U/L (ref 38–126)
Anion gap: 11 (ref 5–15)
BUN: 25 mg/dL — ABNORMAL HIGH (ref 8–23)
CO2: 22 mmol/L (ref 22–32)
Calcium: 9 mg/dL (ref 8.9–10.3)
Chloride: 104 mmol/L (ref 98–111)
Creatinine, Ser: 1.18 mg/dL (ref 0.61–1.24)
GFR, Estimated: >60 mL/min (ref >60)
Glucose, Bld: 101 mg/dL — ABNORMAL HIGH (ref 70–99)
Potassium: 4.2 mmol/L (ref 3.5–5.1)
Sodium: 137 mmol/L (ref 135–145)
Total Bilirubin: 1.1 mg/dL (ref 0.3–1.2)
Total Protein: 6.5 g/dL (ref 6.5–8.1)

## 2022-08-25 LAB — TROPONIN I (HIGH SENSITIVITY): Troponin I (High Sensitivity): 7 ng/L (ref <18)

## 2022-08-25 LAB — CBC WITH DIFFERENTIAL/PLATELET
Abs Immature Granulocytes: 0.02 10*3/uL (ref 0.00–0.07)
Basophils Absolute: 0 10*3/uL (ref 0.0–0.1)
Basophils Relative: 0 %
Eosinophils Absolute: 0.1 10*3/uL (ref 0.0–0.5)
Eosinophils Relative: 1 %
HCT: 40.7 % (ref 39.0–52.0)
Hemoglobin: 13.5 g/dL (ref 13.0–17.0)
Immature Granulocytes: 0 %
Lymphocytes Relative: 13 %
Lymphs Abs: 1 10*3/uL (ref 0.7–4.0)
MCH: 31 pg (ref 26.0–34.0)
MCHC: 33.2 g/dL (ref 30.0–36.0)
MCV: 93.3 fL (ref 80.0–100.0)
Monocytes Absolute: 1.4 10*3/uL — ABNORMAL HIGH (ref 0.1–1.0)
Monocytes Relative: 17 %
Neutro Abs: 5.6 10*3/uL (ref 1.7–7.7)
Neutrophils Relative %: 69 %
Platelets: 225 10*3/uL (ref 150–400)
RBC: 4.36 MIL/uL (ref 4.22–5.81)
RDW: 11.9 % (ref 11.5–15.5)
WBC: 8.1 10*3/uL (ref 4.0–10.5)
nRBC: 0 % (ref 0.0–0.2)

## 2022-08-25 LAB — RESP PANEL BY RT-PCR (RSV, FLU A&B, COVID)  RVPGX2
Influenza A by PCR: NEGATIVE
Influenza B by PCR: NEGATIVE
Resp Syncytial Virus by PCR: NEGATIVE
SARS Coronavirus 2 by RT PCR: NEGATIVE

## 2022-08-25 LAB — PROCALCITONIN: Procalcitonin: <0.1 ng/mL

## 2022-08-25 NOTE — ED Notes (Signed)
Pt rolling side to side while mumbling. Pt looks very exhausted.

## 2022-08-25 NOTE — ED Notes (Signed)
Pt brief and chux changed at this time.  °

## 2022-08-25 NOTE — ED Provider Notes (Addendum)
-----------------------------------------   5:13 AM on 08/25/2022 -----------------------------------------   Blood pressure 110/66, pulse 75, temperature 99.1 F (37.3 C), temperature source Oral, resp. rate 18, weight 78.2 kg, SpO2 95 %.  The patient is calm and cooperative at this time.  There have been no acute events since the last update.  Awaiting disposition plan from case management/social work.    Raymond Azure, Delice Bison, DO 08/25/22 0513   5:21 AM  Pt's nurse reported patient was "not looking well".  Patient has dementia and is unable to provide any history.  Exam nonfocal.  Regular rate and rhythm, lung sounds clear, abdominal exam benign.  Drowsy but has received multiple sedative medications recently.  Low-grade oral temp earlier of 99.1.  Otherwise hemodynamically stable.  Will repeat labs, EKG and obtain COVID, flu swab.  Will continue to closely monitor.  Urine obtained yesterday was unremarkable.   Makhi Muzquiz, Delice Bison, DO 08/25/22 0522   6:12 AM  Pt's rectal temp is 99.3.  EKG shows no ischemia or arrhythmia.  No leukocytosis or leukopenia.  Normal hemoglobin.  Normal electrolytes and renal function.  Lactic is negative.  Troponin negative.  Lungs clear to auscultation here and he has not had any cough.  No indication for chest x-ray.  COVID, flu and RSV negative.  No indication for medical admission at this time.  Will continue to monitor.   Tyrihanna Wingert, Delice Bison, DO 08/25/22 [phone]

## 2022-08-25 NOTE — ED Notes (Signed)
Pt ate 75% of meal at this time.

## 2022-08-25 NOTE — ED Provider Notes (Signed)
Procalc is negative  unlikely bacterial infection causing low grade temps and pt has normal white count.    Vanessa Artemus, MD 08/25/22 [phone]

## 2022-08-25 NOTE — ED Notes (Signed)
Pt complaining of back pain RN notified

## 2022-08-25 NOTE — ED Notes (Signed)
Patient did receive a lunch tray. Patient has been sleeping. RN was notified.

## 2022-08-25 NOTE — ED Notes (Signed)
TOC

## 2022-08-25 NOTE — ED Notes (Signed)
Pt drank a whole ensure

## 2022-08-25 NOTE — ED Notes (Signed)
Pt's breakfast tray came. Pt is still sound asleep. EDT did not want to disturb pt, Breakfast is set aside for pt. EDT at bedside.

## 2022-08-26 NOTE — ED Notes (Signed)
Lunch tray was delivered. Will feed pt when they wake up.

## 2022-08-26 NOTE — ED Notes (Signed)
This tech as 1:1 sitter for pt along with another pt. Pt is currently restless in bed, groaning very loudly. Pt has blue mits on from night shift.

## 2022-08-26 NOTE — ED Notes (Signed)
Pt keeps trying to get out the bed. Pt also keep grabbing onto the rails of the bed and shaking it. Pt constantly being told to stay in the bed, does not listen, just states "what are you going to do about it"

## 2022-08-26 NOTE — ED Notes (Signed)
Pt keeps trying to crawl over the bed rail. This tech has tried multiple times to redirect pt. Has tried to prop pt up on pillow facing away from the railing. Nothing has been successful. RN made aware.

## 2022-08-26 NOTE — ED Notes (Signed)
This tech is sitting with pt while 1:1 sitter is on their break

## 2022-08-26 NOTE — ED Notes (Signed)
This tech fed pt his lunch. Pt ate 50%

## 2022-08-26 NOTE — ED Notes (Signed)
Pt ate 100% of dinner
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
## 2022-08-26 NOTE — ED Notes (Signed)
Pt drank 100% of his ensure.

## 2022-08-26 NOTE — ED Notes (Signed)
PT still attempting to crawl over bed rails. Is not easily redirectable

## 2022-08-26 NOTE — ED Notes (Signed)
This tech as 1:1 sitter.  Pt continues to tray to crawl over rail. Pt not redirectable.

## 2022-08-26 NOTE — ED Notes (Signed)
Pt given breakfast. Pt family members here to visit and are helping him eat his breakfast

## 2022-08-26 NOTE — ED Notes (Signed)
Pt visibly restless and uncomfortable in bed. Pt does not appear to be able to sleep for more than ~15 minutes at a time before he begins to roll around in bed and start moaning and groaning,

## 2022-08-26 NOTE — ED Notes (Signed)
Pt is attempting to climb over rail. Not easily redirectable.

## 2022-08-27 DIAGNOSIS — F03918 Unspecified dementia, unspecified severity, with other behavioral disturbance: Secondary | ICD-10-CM | POA: Diagnosis not present

## 2022-08-27 DIAGNOSIS — R509 Fever, unspecified: Secondary | ICD-10-CM | POA: Diagnosis not present

## 2022-08-27 NOTE — ED Notes (Signed)
Pt Woke up after lunch tray was brought out. EDT myself and EDT Akia changed the pt due to the pt being soiled. Pt's brief and chuck pad was changed. Pt's shirt was also dirty so pt was given a hospital gown. Once that was done pt ate lunch, pt ate about 70 percent of the actual food and pt drank about half of the ensure/boost protein shake. Pt allowed EDT to get vitals. Pt is now laying in bed kicking their feet. EDT at Bedside.

## 2022-08-27 NOTE — ED Notes (Signed)
Pt was awake to feed. Tech fed patient 90 percent of food and all of milk. Pt now asleep.

## 2022-08-27 NOTE — ED Notes (Signed)
EDT changed pts brief due to it being soiled. While doing so, pt grabbed EDT around torso. Pt also began hitting at EDT with his hand which still has the safety mitt applied. Pt redirected and made aware that behavior towards ED staff would not be tolerated. Pt responded with "I was just trying to give you some sugar. I have waited two weeks for this and now you just leave me".

## 2022-08-27 NOTE — ED Provider Notes (Signed)
-----------------------------------------   7:53 PM on 08/27/2022 -----------------------------------------   Blood pressure (!) 126/59, pulse 68, temperature 97.9 F (36.6 C), temperature source Axillary, resp. rate 15, weight 78.2 kg, SpO2 98 %.  The patient is resting comfortably in his bed.  There have been no acute events since the last update.  Awaiting disposition plan from Social Work team.    Arta Silence, MD 08/27/22 [phone]

## 2022-08-27 NOTE — ED Notes (Signed)
Vol / pending TOC placement 

## 2022-08-27 NOTE — ED Provider Notes (Signed)
-----------------------------------------   6:06 AM on 08/27/2022 -----------------------------------------   Blood pressure 110/66, pulse 75, temperature 99.3 F (37.4 C), temperature source Rectal, resp. rate 18, weight 78.2 kg, SpO2 95 %.  The patient is calm and cooperative at this time.  There have been no acute events since the last update.  Awaiting disposition plan from Social Work team.   Paulette Blanch, MD 08/27/22 [phone]

## 2022-08-27 NOTE — ED Notes (Signed)
Pt brief changed at this time 

## 2022-08-27 NOTE — ED Notes (Signed)
Tech and nurse just changed pt bed linen and new brief. Pt used the urinal too

## 2022-08-28 DIAGNOSIS — F03918 Unspecified dementia, unspecified severity, with other behavioral disturbance: Secondary | ICD-10-CM | POA: Diagnosis not present

## 2022-08-28 DIAGNOSIS — R509 Fever, unspecified: Secondary | ICD-10-CM | POA: Diagnosis not present

## 2022-08-28 NOTE — ED Provider Notes (Signed)
-----------------------------------------   5:12 AM on 08/28/2022 -----------------------------------------   Blood pressure (!) 126/59, pulse 68, temperature 97.9 F (36.6 C), temperature source Axillary, resp. rate 15, weight 78.2 kg, SpO2 98 %.  The patient is calm and cooperative at this time.  There have been no acute events since the last update.  Awaiting disposition plan from case management/social work.    Teffany Blaszczyk, Delice Bison, DO 08/28/22 [phone]

## 2022-08-28 NOTE — ED Notes (Signed)
Assumed care from [place], [place]. Pt resting comfortably in bed at this time. Pt has safety sitter at bedside.

## 2022-08-28 NOTE — ED Notes (Signed)
Pt keeps attempting to get out of bed, this tech redirects pt and seems to work for couple mins at a time.

## 2022-08-28 NOTE — ED Notes (Signed)
Pt asleep at this time. Respirations even and unlabored. Equal chest rise and fall. Safety sitter at bedside.

## 2022-08-28 NOTE — ED Notes (Signed)
Pt sleeping at this time. Pt respirations even and unlabored. Equal chest rise and fall. Safety sitter at bedside.

## 2022-08-28 NOTE — ED Notes (Signed)
This Tech gave this patient food tray. This Tech fed this patient.

## 2022-08-28 NOTE — ED Notes (Signed)
Pt awake and cleaned of incontinence by safety sitter.

## 2022-08-28 NOTE — ED Notes (Signed)
vol/toc placement.. 

## 2022-08-28 NOTE — ED Notes (Signed)
Assigned to pt, [employer] cabin crew.  Pt restless upon arrival.  Pt had soiled brief and stated so.  Pt cleaned, new brief, mesh underwear over brief, and chux applied.  Warm blankets given and pt rolled over and went to sleep.

## 2022-08-28 NOTE — ED Notes (Signed)
Pt given meds with icecream.

## 2022-08-28 NOTE — ED Notes (Signed)
Pt cleaned of incontinence

## 2022-08-29 DIAGNOSIS — F03918 Unspecified dementia, unspecified severity, with other behavioral disturbance: Secondary | ICD-10-CM | POA: Diagnosis not present

## 2022-08-29 MED ORDER — LACTULOSE 10 GM/15ML PO SOLN
30.0000 g | Freq: Once | ORAL | Status: AC
  Administered 2022-08-29: 30 g via ORAL
  Filled 2022-08-29: qty 60

## 2022-08-29 NOTE — ED Notes (Signed)
Pt pointing to chest and giving RN and tech a look of discomfort. RN asked pt if his chest was hurting but he did not give a direct response. Tech getting vitals and EKG.

## 2022-08-29 NOTE — ED Notes (Signed)
Pt stated "I want some food", pt given a cup of applesauce. Pt consumed 100% of applesauce.

## 2022-08-29 NOTE — ED Notes (Signed)
Pt urinated in brief. Pt cleaned and a clean brief placed on pt.

## 2022-08-29 NOTE — ED Notes (Signed)
EKG given to MD Brad[place]. RN placed pt on cardiac monitoring.

## 2022-08-29 NOTE — ED Notes (Addendum)
This EDT gave patient a bed bath and back rub. Fresh linens, brief and clothes applied. Patient redirectable and tolerated well. Patient currently awake and fidgeting in bed.

## 2022-08-29 NOTE — ED Notes (Signed)
TOC

## 2022-08-29 NOTE — ED Notes (Signed)
Pt urinated in brief. Pt cleaned and a clean brief placed on pt. Bed linens also changed. Pt provided with warm blankets.

## 2022-08-29 NOTE — ED Provider Notes (Signed)
-----------------------------------------   3:38 AM on 08/29/2022 -----------------------------------------   Blood pressure (!) 110/56, pulse (!) 56, temperature 97.9 F (36.6 C), temperature source Oral, resp. rate 18, weight 78.2 kg, SpO2 100 %.  The patient is calm and cooperative at this time.  There have been no acute events since the last update.  Awaiting disposition plan from [facility] team.   Hinda Kehr, MD 08/29/22 [phone]
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
## 2022-08-29 NOTE — ED Notes (Signed)
Pt provided with water to drink at this time.

## 2022-08-29 NOTE — ED Notes (Signed)
Breakfast given to pt. Assisted pt. with breakfast tray

## 2022-08-29 NOTE — ED Notes (Signed)
Pt provided with a cup of ice cream at this time.

## 2022-08-29 NOTE — ED Notes (Signed)
Family at bedside. 

## 2022-08-29 NOTE — ED Notes (Signed)
Patient was provided dinner tray. Patient ate about 50% of tray. Tray discarded appropriately.

## 2022-08-29 NOTE — ED Notes (Signed)
Pt medications given with ice cream. Pt calm and cooperative at this time. Safety sitter at bedside.

## 2022-08-29 NOTE — ED Notes (Signed)
Assumed care from [place]. Pt resting comfortably in bed at this time. Safety sitter at bedside.

## 2022-08-29 NOTE — ED Notes (Signed)
Patient was cleaned up and brief was changed at this time.

## 2022-08-29 NOTE — ED Notes (Incomplete)
Pt assisted out of bed by this tech and Nira Conn, RN and rolled to bathroom in wheelchair. Pt having difficulty having bowel movement

## 2022-08-30 DIAGNOSIS — F03918 Unspecified dementia, unspecified severity, with other behavioral disturbance: Secondary | ICD-10-CM | POA: Diagnosis not present

## 2022-08-30 NOTE — ED Notes (Signed)
TOC

## 2022-08-30 NOTE — ED Notes (Signed)
Pt with urine soiled brief. Pt able to lift hips upon command for brief changing. Peri care performed and clean brief applied. Pt given warm blanket and is resting comfortably at this time.

## 2022-08-30 NOTE — ED Notes (Signed)
Vol  pending  TOC  placement 

## 2022-08-30 NOTE — ED Notes (Signed)
Pt resting comfortably, RR even and unlabored 

## 2022-08-30 NOTE — ED Provider Notes (Signed)
-----------------------------------------   5:11 AM on 08/30/2022 -----------------------------------------   Blood pressure 103/86, pulse 82, temperature 97.7 F (36.5 C), temperature source Oral, resp. rate 18, weight 78.2 kg, SpO2 99 %.  The patient is calm and cooperative at this time.  There have been no acute events since the last update.  Awaiting disposition plan from case management/social work.    Nathaniel Man, MD 08/30/22 [phone]

## 2022-08-30 NOTE — ED Notes (Addendum)
Pt stated "I want some food", Pt given a cup of applesauce. Pt consumed 100% of applesauce.

## 2022-08-30 NOTE — ED Notes (Signed)
Pt ate approx. 50% of food tray, 1 ensure and water.

## 2022-08-30 NOTE — TOC Progression Note (Signed)
Transition of Care [facility]) - Progression Note    Patient Details  Name: Terry Sloan MRN: [id number] Date of Birth: November 24, 1940  Transition of [facility]) CM/SW Contact  Ross Ludwig, [place] Phone Number: 08/30/2022, 3:10 PM  Clinical Narrative:     Patient still getting haldol injection, SNFs will not consider patient until he stops getting Haldol injections.  Still no bed offers.  Expected Discharge Plan:  (TBD) Barriers to Discharge: Continued Medical Work up  Expected Discharge Plan and Services                                               Social Determinants of Health (SDOH) Interventions SDOH Screenings   Food Insecurity: No Food Insecurity (07/23/2022)  Housing: Low Risk  (07/23/2022)  Transportation Needs: No Transportation Needs (07/23/2022)  Utilities: Not At Risk (07/23/2022)  Alcohol Screen: Low Risk  (07/23/2022)  Depression (PHQ2-9): Low Risk  (07/23/2022)  Recent Concern: Depression (PHQ2-9) - Medium Risk (06/17/2022)  Financial Resource Strain: Low Risk  (07/23/2022)  Physical Activity: Insufficiently Active (07/23/2022)  Social Connections: Socially Isolated (07/23/2022)  Stress: No Stress Concern Present (07/23/2022)  Tobacco Use: Medium Risk (08/16/2022)    Readmission Risk Interventions     No data to display

## 2022-08-30 NOTE — ED Notes (Signed)
Pt urinated in brief. Pt cleaned and a clean brief placed on pt. Warm blankets provided to pt.

## 2022-08-30 NOTE — ED Notes (Signed)
This NT helped pt urinate in urinal. Pt urinated an amount of 100 ML.

## 2022-08-31 DIAGNOSIS — F03918 Unspecified dementia, unspecified severity, with other behavioral disturbance: Secondary | ICD-10-CM | POA: Diagnosis not present

## 2022-08-31 NOTE — ED Notes (Signed)
Pt eating breakfast 

## 2022-08-31 NOTE — ED Notes (Signed)
Pt fell asleep, will obtain VS after pt wakes up.

## 2022-08-31 NOTE — ED Notes (Addendum)
Pt brief changed & pt given new blankets. Pt in bed resting.

## 2022-08-31 NOTE — ED Notes (Signed)
Pt back in bed.

## 2022-08-31 NOTE — ED Provider Notes (Signed)
-----------------------------------------   5:19 AM on 08/31/2022 -----------------------------------------   Blood pressure 103/86, pulse 82, temperature 97.7 F (36.5 C), temperature source Oral, resp. rate 18, weight 78.2 kg, SpO2 99 %.  The patient is calm and cooperative at this time.  There have been no acute events since the last update.  Awaiting disposition plan from Social Work team.   Paulette Blanch, MD 08/31/22 [phone]

## 2022-08-31 NOTE — ED Notes (Signed)
Pt was fed dinner.

## 2022-08-31 NOTE — ED Notes (Signed)
Pt very restless and constantly trying to get out of bed. NT assisted with walking with pt to recliner at bedside. Pt sitting calmly & redirectable when trying to get up.

## 2022-08-31 NOTE — ED Notes (Signed)
Pt calling out "mama! And "mom!" repeatedly.

## 2022-08-31 NOTE — ED Provider Notes (Signed)
Emergency Medicine Observation Re-evaluation Note  Terry Sloan is a 82 y.o. male, continues to board in the emergency department awaiting placement into a care facility.  No acute events since last update. Physical Exam  BP 108/70   Pulse 78   Temp 97.8 F (36.6 C) (Terry)   Resp 18   Wt 78.2 kg   SpO2 97%   BMI 24.74 kg/m    ED Course / MDM   No recent lab work for review  Plan  Current plan is for placement to an appropriate living facility once available.    Harvest Dark, MD 08/31/22 [id number]

## 2022-08-31 NOTE — ED Notes (Signed)
First encounter with pt. Pt is alert, muttering incoherently. Pt moving around in bed and removing covers. Respirations are unlabored. Sitter at bedside.

## 2022-08-31 NOTE — ED Notes (Signed)
Pt brief changed.  

## 2022-09-01 NOTE — ED Notes (Signed)
This tech as 1:1 sitter for this pt and another pt. Pt resting at this time, RR even and unlabored.

## 2022-09-01 NOTE — ED Notes (Signed)
Pt keeps sitting up attempting to get out of bed, this tech asks pt to lay down, pt sates "I will if you leave me alone"

## 2022-09-01 NOTE — ED Notes (Signed)
Pt resting. RR even and unlabored.

## 2022-09-01 NOTE — ED Notes (Signed)
Pt stated he needed to pee, this tech removed the blue mittens on his hands and handed him the urinal. Pt then holds urinal in hand for a while and tries to break the top of the covering. This tech takes urinal away from pt and he then yells at this tech in incomprehensible language. Mittens placed back on pt.  Pt is seen away kicking legs over the bed to try to get out. Pt is also punching the side rails. Pt is told by this tech to stop, pt states "why don't you stop what you're doing"

## 2022-09-01 NOTE — ED Notes (Addendum)
EDT was guiding the other pt in 1 Hall back to bed due to that pt standing up out of bed. This pt seen with both legs hanging over the raised side rails of the bed. EDT came and told pt "We are going to work together to get you straightened up in bed. We can't get up right now". EDT supported pts legs to guide them back to bed. Pt began kicking EDT in the chest and head saying "No". Bed rails lowered to facilitate a swift transfer of the pts legs back into the bed without injury for the pt or EDT.   Pt repositioned in bed for his comfort, bed rails raised for pt safety. EDT currently sitting beside pt as he continues to kick at foot of bed. Safety mitts still on to promote safety.

## 2022-09-01 NOTE — ED Notes (Signed)
Asked pt if he was hungry, as lunch has arrived, he states "no not right now"

## 2022-09-01 NOTE — ED Notes (Signed)
Pt ate 100% of lunch tray

## 2022-09-01 NOTE — ED Notes (Signed)
Patient's breakfast at bedside. Patient ate 75% with assistance.

## 2022-09-01 NOTE — ED Notes (Signed)
Pt becoming increasingly restless. Will not lay down.

## 2022-09-01 NOTE — ED Notes (Signed)
Pt is seen biting the side rails on the hospital bed and kicking legs over in attempt to get out of the bed.

## 2022-09-01 NOTE — ED Notes (Signed)
Pt given shower by this RN and Verdis Frederickson EDT. Pt placed in new brief, new gown. Bed linens changed and chucks placed. Pt returned to bed.

## 2022-09-01 NOTE — ED Notes (Addendum)
EDT sat pt upright in bed and fed pt food from hospital dinner tray. Meal partially consumed with sips of drink offered in between bites. Pt statd he was full. Will offer food again at a later time.

## 2022-09-02 DIAGNOSIS — F03918 Unspecified dementia, unspecified severity, with other behavioral disturbance: Secondary | ICD-10-CM | POA: Diagnosis not present

## 2022-09-02 MED ORDER — LORAZEPAM 1 MG PO TABS: 1.0000 mg | ORAL_TABLET | Freq: Once | ORAL | Status: DC

## 2022-09-02 MED ORDER — LORAZEPAM 2 MG/ML IJ SOLN
1.0000 mg | Freq: Once | INTRAMUSCULAR | Status: AC
  Administered 2022-09-02: 1 mg via INTRAMUSCULAR
  Filled 2022-09-02: qty 1

## 2022-09-02 NOTE — ED Provider Notes (Signed)
-----------------------------------------   5:17 AM on 09/02/2022 -----------------------------------------   Blood pressure 120/66, pulse 77, temperature 99.6 F (37.6 C), temperature source Oral, resp. rate 18, weight 78.2 kg, SpO2 95 %.  The patient is calm and cooperative at this time.  There have been no acute events since the last update.  Awaiting disposition plan from Social Work team.   Paulette Blanch, MD 09/02/22 [phone]

## 2022-09-02 NOTE — ED Notes (Signed)
vol/toc placement.. 

## 2022-09-02 NOTE — ED Notes (Addendum)
This tech sitting with this pt not 1:1 because of sitting with another pt at the same time. Pt is sleeping and resting.

## 2022-09-02 NOTE — ED Notes (Signed)
Pt consumed about 90% of meal tray.

## 2022-09-02 NOTE — ED Notes (Addendum)
Pt brief and bed linen changed.  

## 2022-09-02 NOTE — ED Notes (Signed)
This tech just changed pt brief

## 2022-09-02 NOTE — ED Notes (Signed)
Patient had breakfast this morning along with his ensure.

## 2022-09-02 NOTE — ED Notes (Addendum)
Pt seems to be very restless and continuously trying to get out of bed. This NT repositioned the head of the bed to an upward angle, Pt seems to prefer sitting up in bed as opposed to the lying position. Pt has not attempted to get out of bed since in this position. Pt given snack.

## 2022-09-02 NOTE — ED Notes (Signed)
Pt brief changed.  

## 2022-09-02 NOTE — ED Notes (Signed)
Pt tray has arrived and tech sat pt up to eat but has now fallen back asleep. Will give pt tray when is more awake.

## 2022-09-02 NOTE — ED Notes (Signed)
Report received, this RN now assuming care.  

## 2022-09-02 NOTE — ED Notes (Signed)
Pt just got cleaned up. New brief and pad. Pt is now laying on right side

## 2022-09-02 NOTE — ED Notes (Signed)
This tech just fed pt. Pt ate about 50%

## 2022-09-02 NOTE — ED Notes (Signed)
Pt brief changed. Pt resting in bed.

## 2022-09-03 DIAGNOSIS — F03918 Unspecified dementia, unspecified severity, with other behavioral disturbance: Secondary | ICD-10-CM | POA: Diagnosis not present

## 2022-09-03 NOTE — ED Notes (Signed)
This NT and NT Megan cleaned up patient. New sheet, pillowcases, blankets, and brief provided. Patient now resting in bed.

## 2022-09-03 NOTE — ED Notes (Signed)
This NT changed pts brief, sheets, and blanket because pt had urinated in brief and on sheets.

## 2022-09-03 NOTE — ED Notes (Signed)
This Probation officer assisted pt in changing dirty brief with a new one and repositioned in bed.

## 2022-09-03 NOTE — ED Notes (Signed)
Pt very agitated and swatting at EDT Faith. Pt trying to get out of bed, and very difficult to re-direct.

## 2022-09-03 NOTE — ED Notes (Signed)
Family sitting with pt 

## 2022-09-03 NOTE — ED Provider Notes (Signed)
-----------------------------------------   6:29 AM on 09/03/2022 -----------------------------------------   Blood pressure 111/72, pulse 80, temperature 98 F (36.7 C), temperature source Oral, resp. rate 18, weight 78.2 kg, SpO2 97 %.  The patient is calm and cooperative at this time.  There have been no acute events since the last update.  Awaiting disposition plan from case management/social work.    Hagop Mccollam, Delice Bison, DO 09/03/22 [phone]

## 2022-09-03 NOTE — ED Notes (Signed)
This NT fed pt beef with gravy, mashed potatoes, broccoli, and a magic cup for dinner. Pt ate 85%. Pt also drank 4oz of sweet tea. Notified the RN assigned to this pt.

## 2022-09-03 NOTE — ED Notes (Signed)
Patient currently trying to get out of bed, this writer continues to redirect pt to stay in bed and repositioned.

## 2022-09-03 NOTE — ED Notes (Signed)
This Probation officer assisted patient in getting brief changed and repositioned in bed.

## 2022-09-04 DIAGNOSIS — F03918 Unspecified dementia, unspecified severity, with other behavioral disturbance: Secondary | ICD-10-CM | POA: Diagnosis not present

## 2022-09-04 MED ORDER — LORAZEPAM 2 MG/ML IJ SOLN
1.0000 mg | Freq: Once | INTRAMUSCULAR | Status: AC
  Administered 2022-09-04: 1 mg via INTRAMUSCULAR
  Filled 2022-09-04: qty 1

## 2022-09-04 NOTE — ED Notes (Signed)
Patient  in bed free from sign of distress. Breathing unlabored speaking in full sentences with symmetric chest rise and fall. Bed low and locked with side rails raised x2. Sitter at bedside.

## 2022-09-04 NOTE — ED Notes (Signed)
Pt still continuing to get out of bed, when this tech tried to redirect pt and moves pt legs over from over the railing and repositions pt upper body due to him trying to grab railing and turn over bedside., pt grabbed this tech fingers hard. Pt redirected and told to just stay put in bed, since he can not get up cause he would harm himself if he fell. Pt still trying to throw legs over railing at this time but keeping upper body on bed.

## 2022-09-04 NOTE — ED Notes (Signed)
Patient was given shower. Patient has a new brief, clean linens and a clean gown.

## 2022-09-04 NOTE — ED Notes (Signed)
Pt repositioned and sat up in bed to facilitate eating. Hospital dinner tray fed to pt by this EDT. Pt offered one bite at a time. Drink provided in between bites. Meal 75% consumed. Waste discarded. Pt remains sitting up after eating to promote safety. EDT at bedside.

## 2022-09-04 NOTE — ED Provider Notes (Signed)
Emergency Medicine Observation Re-evaluation Note  Physical Exam   BP (!) 109/59 (BP Location: Left Arm)   Pulse 84   Temp 98.3 F (36.8 C) (Oral)   Resp 17   Wt 78.2 kg   SpO2 100%   BMI 24.74 kg/m   Patient appears in no acute distress.  ED Course / MDM   No reported events during my shift at the time of this note.   Pt is awaiting dispo from SW   Lucillie Garfinkel MD    Lucillie Garfinkel, MD 09/04/22 [phone]

## 2022-09-04 NOTE — ED Notes (Signed)
Pt continuously trying to get out of bed at this time, this tech and trainee tech ashanti redirecting pt to keep him in bed. Pt mentioned he wanted something to drink, tried to assist pt with giving water to drink but pt refused.

## 2022-09-04 NOTE — ED Notes (Signed)
This tech and tech ashanti changed pt and applied clean and dry brief

## 2022-09-04 NOTE — ED Notes (Signed)
Fall floor pads placed by pt's bed. Pt continuing to try to put legs over the side rails. Sitter at bedside.

## 2022-09-04 NOTE — ED Notes (Addendum)
This EDT assumed the safety sitting role of this pt. Pts brief was visibly soiled and full of urine. New brief applied after thorough pericare. New chux placed. Pt repositioned in bed and provided another pillow to cushion his head as he tosses and turns in bed.  EDT took off safety mitts to examine pts hands. Pts hands were free of redness, irritation, and cuts at this time. Good pulse, motor, and circulation to all digits noted. No bruises visible to this EDT. Safety mitts reapplied to pts hands to promote a safe environment. EDT near bedside to monitor pt.

## 2022-09-04 NOTE — ED Notes (Signed)
Pt attempting multiple times to roll out of bed, pt is redirectable at this time.

## 2022-09-04 NOTE — ED Notes (Signed)
Pt resting in bed. Sitter at bedside. Respirations noted.

## 2022-09-04 NOTE — ED Notes (Signed)
Patient alert, disoriented and stating he is going to go home. Patient redirectable and cooperative with taking medication. No distress noted at this time.

## 2022-09-05 DIAGNOSIS — F03918 Unspecified dementia, unspecified severity, with other behavioral disturbance: Secondary | ICD-10-CM | POA: Diagnosis not present

## 2022-09-05 NOTE — ED Notes (Signed)
Pt cleaned and linen change provided by this tech and Student Nurse Logan at this time. Pt tolerated well and is resting.

## 2022-09-05 NOTE — ED Notes (Signed)
Pericare performed. Soiled brief disgard, pt cleaned and dry.

## 2022-09-05 NOTE — ED Notes (Signed)
This RN cleaned pt of incontinence.

## 2022-09-05 NOTE — ED Notes (Signed)
Pt was changed, Pt ate about 50 percent of their food. Pt has been trying to get out of the bed they entire time EDT has been sitting with pt.

## 2022-09-05 NOTE — ED Notes (Signed)
Pt sleeping comfortably at this time. No distress noted. Pt respirations even and unlabored. Safety sitter at bedside.

## 2022-09-05 NOTE — ED Notes (Addendum)
Pt sleeping with no signs of acute distress.

## 2022-09-05 NOTE — ED Notes (Signed)
Pt threw stuff animal. Nt Regulatory affairs officer) told pt not to throw things.

## 2022-09-05 NOTE — ED Notes (Signed)
Patient ate all his food 

## 2022-09-05 NOTE — ED Notes (Signed)
Patient  in bed free from sign of distress. Breathing unlabored speaking in full sentences with symmetric chest rise and fall. Bed low and locked with side rails raised x2. Sitter at bedside.

## 2022-09-05 NOTE — ED Provider Notes (Signed)
Emergency Medicine Observation Re-evaluation Note  Terry Sloan is a 82 y.o. male, seen on rounds today.  Pt initially presented to the ED for complaints of Psychiatric Evaluation  Currently, the patient is calm, no acute complaints.  Physical Exam  Blood pressure 110/89, pulse 83, temperature 97.9 F (36.6 C), temperature source Oral, resp. rate 17, weight 78.2 kg, SpO2 99 %. Physical Exam General: NAD Lungs: CTAB Psych: not agitated  ED Course / MDM  EKG:    I have reviewed the labs performed to date as well as medications administered while in observation.  Recent changes in the last 24 hours include no acute events overnight.    Plan  Current plan is for SW dispo. Patient is not under full IVC at this time.   Carrie Mew, MD 09/05/22 2120

## 2022-09-05 NOTE — ED Notes (Signed)
Family at bedside. Pt calm and cooperative eating lunch. Safety sitter at bedside.

## 2022-09-05 NOTE — ED Notes (Signed)
Pt moving around in bed.

## 2022-09-05 NOTE — ED Notes (Signed)
Assumed care from [facility], [place]. Pt resting comfortably in bed at this time. Safety sitter at bedside.

## 2022-09-05 NOTE — ED Notes (Signed)
Patient has daughter and granddaughter at bedside. Daughter is feeding patient lunch.

## 2022-09-06 ENCOUNTER — Emergency Department: Payer: Medicare HMO

## 2022-09-06 ENCOUNTER — Encounter: Payer: Self-pay | Admitting: Internal Medicine

## 2022-09-06 DIAGNOSIS — E782 Mixed hyperlipidemia: Secondary | ICD-10-CM | POA: Diagnosis present

## 2022-09-06 DIAGNOSIS — Z7189 Other specified counseling: Secondary | ICD-10-CM | POA: Diagnosis not present

## 2022-09-06 DIAGNOSIS — N400 Enlarged prostate without lower urinary tract symptoms: Secondary | ICD-10-CM | POA: Diagnosis present

## 2022-09-06 DIAGNOSIS — F03918 Unspecified dementia, unspecified severity, with other behavioral disturbance: Secondary | ICD-10-CM | POA: Diagnosis not present

## 2022-09-06 DIAGNOSIS — R9431 Abnormal electrocardiogram [ECG] [EKG]: Secondary | ICD-10-CM | POA: Diagnosis not present

## 2022-09-06 DIAGNOSIS — F32A Depression, unspecified: Secondary | ICD-10-CM | POA: Diagnosis present

## 2022-09-06 DIAGNOSIS — F0393 Unspecified dementia, unspecified severity, with mood disturbance: Secondary | ICD-10-CM | POA: Diagnosis present

## 2022-09-06 DIAGNOSIS — R400 Somnolence: Secondary | ICD-10-CM | POA: Diagnosis not present

## 2022-09-06 DIAGNOSIS — B348 Other viral infections of unspecified site: Secondary | ICD-10-CM | POA: Diagnosis not present

## 2022-09-06 DIAGNOSIS — R4182 Altered mental status, unspecified: Secondary | ICD-10-CM | POA: Diagnosis present

## 2022-09-06 DIAGNOSIS — Z1152 Encounter for screening for COVID-19: Secondary | ICD-10-CM | POA: Diagnosis not present

## 2022-09-06 DIAGNOSIS — N179 Acute kidney failure, unspecified: Secondary | ICD-10-CM | POA: Diagnosis not present

## 2022-09-06 DIAGNOSIS — G2581 Restless legs syndrome: Secondary | ICD-10-CM | POA: Diagnosis present

## 2022-09-06 DIAGNOSIS — F0394 Unspecified dementia, unspecified severity, with anxiety: Secondary | ICD-10-CM | POA: Diagnosis present

## 2022-09-06 DIAGNOSIS — T68XXXA Hypothermia, initial encounter: Secondary | ICD-10-CM | POA: Insufficient documentation

## 2022-09-06 DIAGNOSIS — F419 Anxiety disorder, unspecified: Secondary | ICD-10-CM | POA: Diagnosis not present

## 2022-09-06 DIAGNOSIS — Z79899 Other long term (current) drug therapy: Secondary | ICD-10-CM | POA: Diagnosis not present

## 2022-09-06 DIAGNOSIS — Z66 Do not resuscitate: Secondary | ICD-10-CM | POA: Diagnosis present

## 2022-09-06 DIAGNOSIS — Z87891 Personal history of nicotine dependence: Secondary | ICD-10-CM | POA: Diagnosis not present

## 2022-09-06 DIAGNOSIS — I739 Peripheral vascular disease, unspecified: Secondary | ICD-10-CM | POA: Diagnosis present

## 2022-09-06 DIAGNOSIS — G4700 Insomnia, unspecified: Secondary | ICD-10-CM | POA: Diagnosis present

## 2022-09-06 DIAGNOSIS — Z515 Encounter for palliative care: Secondary | ICD-10-CM | POA: Diagnosis not present

## 2022-09-06 DIAGNOSIS — R410 Disorientation, unspecified: Secondary | ICD-10-CM | POA: Diagnosis not present

## 2022-09-06 DIAGNOSIS — F0392 Unspecified dementia, unspecified severity, with psychotic disturbance: Secondary | ICD-10-CM | POA: Diagnosis present

## 2022-09-06 DIAGNOSIS — F03911 Unspecified dementia, unspecified severity, with agitation: Secondary | ICD-10-CM | POA: Diagnosis present

## 2022-09-06 DIAGNOSIS — E538 Deficiency of other specified B group vitamins: Secondary | ICD-10-CM | POA: Diagnosis present

## 2022-09-06 DIAGNOSIS — R600 Localized edema: Secondary | ICD-10-CM | POA: Diagnosis not present

## 2022-09-06 DIAGNOSIS — R680 Hypothermia, not associated with low environmental temperature: Secondary | ICD-10-CM | POA: Diagnosis present

## 2022-09-06 DIAGNOSIS — E860 Dehydration: Secondary | ICD-10-CM | POA: Diagnosis present

## 2022-09-06 DIAGNOSIS — I959 Hypotension, unspecified: Secondary | ICD-10-CM | POA: Diagnosis present

## 2022-09-06 DIAGNOSIS — K5900 Constipation, unspecified: Secondary | ICD-10-CM | POA: Diagnosis not present

## 2022-09-06 DIAGNOSIS — N3281 Overactive bladder: Secondary | ICD-10-CM | POA: Diagnosis present

## 2022-09-06 DIAGNOSIS — E870 Hyperosmolality and hypernatremia: Secondary | ICD-10-CM | POA: Diagnosis present

## 2022-09-06 DIAGNOSIS — Z0389 Encounter for observation for other suspected diseases and conditions ruled out: Secondary | ICD-10-CM | POA: Diagnosis not present

## 2022-09-06 LAB — CBC
HCT: 41.9 % (ref 39.0–52.0)
Hemoglobin: 13.8 g/dL (ref 13.0–17.0)
MCH: 31.1 pg (ref 26.0–34.0)
MCHC: 32.9 g/dL (ref 30.0–36.0)
MCV: 94.4 fL (ref 80.0–100.0)
Platelets: 259 10*3/uL (ref 150–400)
RBC: 4.44 MIL/uL (ref 4.22–5.81)
RDW: 11.7 % (ref 11.5–15.5)
WBC: 5.8 10*3/uL (ref 4.0–10.5)
nRBC: 0 % (ref 0.0–0.2)

## 2022-09-06 LAB — URINALYSIS, ROUTINE W REFLEX MICROSCOPIC
Bilirubin Urine: NEGATIVE
Glucose, UA: NEGATIVE mg/dL
Hgb urine dipstick: NEGATIVE
Ketones, ur: NEGATIVE mg/dL
Leukocytes,Ua: NEGATIVE
Nitrite: NEGATIVE
Protein, ur: NEGATIVE mg/dL
Specific Gravity, Urine: 1.026 (ref 1.005–1.030)
pH: 5 (ref 5.0–8.0)

## 2022-09-06 LAB — BASIC METABOLIC PANEL
Anion gap: 6 (ref 5–15)
BUN: 28 mg/dL — ABNORMAL HIGH (ref 8–23)
CO2: 24 mmol/L (ref 22–32)
Calcium: 8.6 mg/dL — ABNORMAL LOW (ref 8.9–10.3)
Chloride: 109 mmol/L (ref 98–111)
Creatinine, Ser: 1.13 mg/dL (ref 0.61–1.24)
GFR, Estimated: >60 mL/min (ref >60)
Glucose, Bld: 92 mg/dL (ref 70–99)
Potassium: 4 mmol/L (ref 3.5–5.1)
Sodium: 139 mmol/L (ref 135–145)

## 2022-09-06 LAB — VALPROIC ACID LEVEL: Valproic Acid Lvl: 30 ug/mL — ABNORMAL LOW (ref 50.0–100.0)

## 2022-09-06 LAB — LACTIC ACID, PLASMA
Lactic Acid, Venous: 1.4 mmol/L (ref 0.5–1.9)
Lactic Acid, Venous: 1.7 mmol/L (ref 0.5–1.9)

## 2022-09-06 LAB — MAGNESIUM: Magnesium: 2.1 mg/dL (ref 1.7–2.4)

## 2022-09-06 LAB — PROCALCITONIN: Procalcitonin: <0.1 ng/mL

## 2022-09-06 LAB — TROPONIN I (HIGH SENSITIVITY): Troponin I (High Sensitivity): 6 ng/L (ref <18)

## 2022-09-06 LAB — TSH: TSH: 3.107 u[IU]/mL (ref 0.350–4.500)

## 2022-09-06 LAB — SARS CORONAVIRUS 2 BY RT PCR: SARS Coronavirus 2 by RT PCR: NEGATIVE

## 2022-09-06 LAB — AMMONIA: Ammonia: 17 umol/L (ref 9–35)

## 2022-09-06 MED ORDER — ACETAMINOPHEN 650 MG RE SUPP: 650.0000 mg | Freq: Four times a day (QID) | RECTAL | Status: AC | PRN

## 2022-09-06 MED ORDER — ROPINIROLE HCL 1 MG PO TABS
0.5000 mg | ORAL_TABLET | Freq: Every day | ORAL | Status: AC
  Administered 2022-09-06 – 2022-09-10 (×5): 0.5 mg via ORAL
  Filled 2022-09-06 (×4): qty 1
  Filled 2022-09-06: qty 2

## 2022-09-06 MED ORDER — LACTATED RINGERS IV SOLN: 150.0000 mL/h | INTRAVENOUS | Status: DC

## 2022-09-06 MED ORDER — LORAZEPAM 0.5 MG PO TABS
0.5000 mg | ORAL_TABLET | Freq: Four times a day (QID) | ORAL | Status: AC | PRN
  Administered 2022-09-07 – 2022-09-08 (×3): 0.5 mg via ORAL
  Filled 2022-09-06 (×3): qty 1

## 2022-09-06 MED ORDER — LACTATED RINGERS IV BOLUS
1000.0000 mL | Freq: Once | INTRAVENOUS | Status: AC
  Administered 2022-09-06 (×2): 1000 mL via INTRAVENOUS

## 2022-09-06 MED ORDER — MELATONIN 5 MG PO TABS
10.0000 mg | ORAL_TABLET | Freq: Every day | ORAL | Status: DC
  Administered 2022-09-06 – 2022-09-17 (×12): 10 mg via ORAL
  Filled 2022-09-06 (×12): qty 2

## 2022-09-06 MED ORDER — ENOXAPARIN SODIUM 40 MG/0.4ML IJ SOSY
40.0000 mg | PREFILLED_SYRINGE | INTRAMUSCULAR | Status: DC
  Administered 2022-09-06 – 2022-11-07 (×59): 40 mg via SUBCUTANEOUS
  Filled 2022-09-06 (×59): qty 0.4

## 2022-09-06 MED ORDER — SODIUM CHLORIDE 0.9 % IV BOLUS (SEPSIS)
250.0000 mL | Freq: Once | INTRAVENOUS | Status: AC
  Administered 2022-09-06: 250 mL via INTRAVENOUS

## 2022-09-06 MED ORDER — ONDANSETRON HCL 4 MG PO TABS: 4.0000 mg | ORAL_TABLET | Freq: Four times a day (QID) | ORAL | Status: AC | PRN

## 2022-09-06 MED ORDER — ACETAMINOPHEN 325 MG PO TABS
650.0000 mg | ORAL_TABLET | Freq: Four times a day (QID) | ORAL | Status: AC | PRN
  Administered 2022-09-07 – 2022-09-11 (×2): 650 mg via ORAL
  Filled 2022-09-06 (×3): qty 2

## 2022-09-06 MED ORDER — ONDANSETRON HCL 4 MG/2ML IJ SOLN: 4.0000 mg | Freq: Four times a day (QID) | INTRAMUSCULAR | Status: AC | PRN

## 2022-09-06 MED ORDER — LACTATED RINGERS IV BOLUS
1000.0000 mL | Freq: Once | INTRAVENOUS | Status: AC
  Administered 2022-09-06: 1000 mL via INTRAVENOUS

## 2022-09-06 MED ORDER — SODIUM CHLORIDE 0.9 % IV SOLN: INTRAVENOUS | Status: DC

## 2022-09-06 NOTE — ED Notes (Signed)
NT notified this RN pt's rectal temp was low, rectal temp of 96.8 and on further eval pt appears to be more quiet than usual at this time but does arouse to light touch. Pt's other vitals obtained and pt appears to be hypotensive, MD aware, IV placed and pt roomed to RM 2 for further eval of infection. Pt is a dementia pt at baseline.   Blood work collected and LR infusions started due to hypotensive. Will continue to monitor.

## 2022-09-06 NOTE — Assessment & Plan Note (Signed)
Tamsulosin 0.4 mg daily after supper resumed

## 2022-09-06 NOTE — ED Notes (Signed)
Dr. Sedalia Muta at bedside. Pt is alert at this time and back to his baseline. Pt does keep moving his legs and feet int he bed, no breakdown on heels noted. This RN recommended requip for restless leg syndrome.

## 2022-09-06 NOTE — ED Provider Notes (Signed)
I was alerted by nursing staff that the patient's blood pressure was low and that he was not acting like himself.  On exam patient did have hypotension and mild hypothermia.  He was quite somnolent and would awaken to physical stimuli.  Broad workup was initiated.  I did initially have concerns for infection however patient without leukocytosis, lactic acidosis or elevated procalcitonin.  Additionally got head CT which not show any concerning abnormalities.  Valproic acid levels not elevated.  Per chart review he has not had any significant medication changes in the past day and last got Haldol 2 days ago.  Did discuss patient's clinical change with the patient's daughter over the telephone.  Patient was given IV fluids which did help improve his blood pressure.  During my shift here in the emergency department he did start becoming slightly more alert with some response to verbal stimuli.  However given patient's continued relatively low blood pressure and altered mental status compared to patient baseline do think he would benefit from admission.    Phineas Semen, MD 09/06/22 1600

## 2022-09-06 NOTE — Hospital Course (Addendum)
[name] is 82 year old male is a 82 year old male with history of dementia, behavioral disturbance, hyperlipidemia, BPH, insomnia, depression, anxiety, who presents to emergency department from [facility] rehab dementia department on 08/08/2022 for chief concerns of aggression with aggressive outburst towards staff. Presented to [name] ED on 2/24 due to agitation.  Was in the ED until 3/1 as he required SNF placement.  Sent to [facility] and [name].  On 3/7 returned back to ER for confusion, combative with staff.  Was IVC in the ED.Behavioral health was consulted.  TOC was consulted for placement to SNF again. however, was declined by facilities due to need for as needed Haldol.  On 4/5 EDP requesting admission for hypotension, hypothermia and change in mentation.  Symptoms resolved by 4/6. Workup was unremarkable for any significant infectious etiology. Patient continues to be seen by psychiatry to help manage his agitation. During the hospitalization had parainfluenza viral infection on 4/27 treated conservatively. As of 5/2 has not required any as needed Haldol dose. Behavior appears to be well-controlled.  Not agitated.  Not combative.

## 2022-09-06 NOTE — Assessment & Plan Note (Signed)
Discussed with RN to repeat temperature

## 2022-09-06 NOTE — H&P (Addendum)
History and Physical   Terry Sloan:[id number] DOB: 1940-06-24 DOA: 08/08/2022  PCP: Pcp, No  Patient coming from: [facility] nursing facility in dementia unit  I have personally briefly reviewed patient's old medical records in [facility] EMR.  Chief Concern: Altered mental status  HPI: Terry Sloan is 82 year old male is a 82 year old male with history of dementia, behavioral disturbance, hyperlipidemia, BPH, insomnia, depression, anxiety, who presents to emergency department from [facility] rehab dementia department on 08/08/2022 for chief concerns of aggression with aggressive outburst towards staff.  Patient was initially IVC.  Behavioral health was consulted and determined that patient was not a danger to self or others.  Patient was pending placement, however was declined due to as needed Haldol doses were given.  On day of admission, nursing noticed that patient's temperature was low, decrease responsiveness/change in mentation, and hypotension.  Serum sodium on day of admission was 139, potassium 4.0, chloride 109, bicarb 24, BUN of 25, serum creatinine 1.13, eGFR greater than 60, nonfasting blood glucose 92, WBC 5.8, hemoglobin 13.8, platelets of 259.  Blood cultures, are in process.  Procalcitonin was less than 0.10, lactic acid was within normal limits.  Magnesium level was 2.1.  UA was negative for leukocytes and nitrates.  Portable chest x-ray was read as rotated portable exam with lower lung volumes.  CT of the head without contrast: Was read as no evidence of acute intracranial abnormality.  Redemonstrated chronic cortical/subcortical infarcts within the right frontal and left occipital lobes.  Redemonstrated chronic infarct within the left cerebellar hemisphere.  Per EDP patient has not required one-to-one sitter over the last few days.  ED treatment: Patient received LR 2 L bolus, LR IVF infusion with minimal improvement in blood pressure. ------------------------- At  bedside, patient does not appear to be in acute distress and he does not appear to be agitated.  He is moving his legs as if he has restless leg syndrome.  Patient is pleasantly confused and was not able to tell me his name.  He really asked me the question "what is my name?".  He was not able to tell me his age, current calendar year current location.  He smiles at me when I continue to talk to him.  He does not appear to be in pain.  He does not endorse being in pain.  Social history: Patient is from [facility] nursing facility in the dementia unit.  ROS: Unable to complete as patient has advanced dementia  ED Course: Discussed with emergency medicine provider, patient requiring hospitalization for chief concerns of change in mental status with hypotension and hypothermia.  Assessment/Plan  Principal Problem:   Altered mental status Active Problems:   Mixed hyperlipidemia   History of TIA (transient ischemic attack)   Anxiety and depression   BPH (benign prostatic hyperplasia)   OAB (overactive bladder)   Dementia with behavioral disturbance   Hypothermia   Restless leg   Assessment and Plan:  * Altered mental status With hypotension and hypothermia Discontinue Haldol as needed Check ammonia, B12 level on admission Recheck temperature as appropriate Maintain MAP greater than 65 Status post 2 L bolus per EDP Ordered additional sodium chloride 250 mL bolus one-time dose Blood cultures x 2 are in process Continue LR infusion at 150 mL/h  Restless leg Initiated Requip trial 0.5 mg nightly, first dose including male, 5 days ordered Patient may require modification of dosing as appropriate per primary team  Hypothermia Discussed with RN to repeat temperature  Dementia with behavioral disturbance Resumed home donepezil 5 mg daily, olanzapine 15 mg p.o. twice daily, Depakote 250 mg p.o. twice daily Of note patient has not had behavioral issues for the last 48+  hours Patient's last dose of Haldol was given on 09/04/2022 at 8:51 AM Haldol IM/IV as needed has been discontinued Behavioral health has been consulted for consideration of optimization of psychiatric/behavioral medication including scheduled Haldol p.o. versus other antipsychotic medication per specialist recommendation  BPH (benign prostatic hyperplasia) Tamsulosin 0.4 mg daily after supper resumed  Anxiety and depression Ativan 0.5 mg p.o. every 6 hours as needed for anxiety, 3 doses ordered  Chart reviewed.   DVT prophylaxis: Enoxaparin Code Status: Full code Diet: Heart healthy, pudding thick Family Communication: No, EDP updated family are ready Disposition Plan: Pending clinical course Consults called: TOC Admission status: Telemetry medical, inpatient  Past Medical History:  Diagnosis Date   Arthritis    Dementia    per EMS   GERD (gastroesophageal reflux disease)    [name]   History of kidney stones    Hyperlipidemia    Macular degeneration    PVD (peripheral vascular disease)    Stroke    TIA     Past Surgical History:  Procedure Laterality Date   Bone turmor Left    foreheah   EYE SURGERY Bilateral    cataract removal   FRACTURE SURGERY Left    leg   pins   KNEE ARTHROPLASTY     KNEE SURGERY Left    NECK SURGERY     Tumor reomoved from head/neck in the 60s   REPLACEMENT TOTAL KNEE Left 07/14/2018   ROTATOR CUFF REPAIR Right    times 2   TOTAL KNEE ARTHROPLASTY WITH HARDWARE REMOVAL Left 02/14/2017   Procedure: Removal Left Tibia Nail, Cemented Total Knee Arthroplasty;  Surgeon: Eldred MangesYates, Mark C, MD;  Location: [facility] OR;  Service: Orthopedics;  Laterality: Left;   Social History:  reports that he has quit smoking. His smoking use included cigarettes. He has a 30.00 pack-year smoking history. He has never used smokeless tobacco. He reports current alcohol use. He reports that he does not use drugs.  Allergies  Allergen Reactions   Celebrex [Celecoxib] Other  (See Comments)    Leg swelling, broke out in rash   Family History  Problem Relation Age of Onset   Stroke Neg Hx    Family history: Family history reviewed and not pertinent.  Prior to Admission medications   Medication Sig Start Date End Date Taking? Authorizing Provider  acetaminophen (TYLENOL) 325 MG tablet Take 2 tablets by mouth every 6 (six) hours as needed for mild pain or fever.   Yes [provider]  donepezil (ARICEPT ODT) 5 MG disintegrating tablet Take 5 mg by mouth daily.   Yes [provider]  haloperidol lactate (HALDOL) 5 MG/ML injection 5 mg once. For aggressive behavior   Yes [provider]  HYDROXYZINE PAMOATE PO Take 1 tablet by mouth every 6 (six) hours as needed (anxiety/agitation).   Yes [provider]  LORazepam (ATIVAN) 1 MG tablet Take 1 mg by mouth every 6 (six) hours as needed for anxiety (anxiety/agitation).   Yes [provider]  melatonin 3 MG TABS tablet Take 1 tablet by mouth at bedtime.   Yes [provider]  OLANZapine (ZYPREXA) 10 MG tablet Take 1 tablet by mouth at bedtime.   Yes [provider]  OLANZapine (ZYPREXA) 15 MG tablet Take 1 tablet by mouth every  evening.   Yes [provider]  pravastatin (PRAVACHOL) 40 MG tablet TAKE ONE (1) TABLET BY MOUTH EVERY DAY Patient taking differently: Take 1 tablet by mouth at bedtime. 06/11/22  Yes Dettinger, Elige Radon, MD  tamsulosin (FLOMAX) 0.4 MG CAPS capsule Take 1 capsule (0.4 mg total) by mouth daily after supper. 09/17/21  Yes McKenzie, Mardene Celeste, MD  multivitamin-lutein [facility]) CAPS capsule Take 1 capsule by mouth daily. Patient not taking: Reported on 08/08/2022    [provider]  oxybutynin (DITROPAN-XL) 5 MG 24 hr tablet Take 5 mg by mouth daily. Patient not taking: Reported on 08/08/2022    [provider]   Physical Exam: Vitals:   09/06/22 0742 09/06/22 0830 09/06/22 1145 09/06/22 1615  BP: (!) 78/45  (!) 107/56 92/60 (!) 97/58  Pulse: (!) 58 (!) 56 (!) 58 (!) 58  Resp: 18 17 12 14   Temp:    (!) 97.3 F (36.3 C)  TempSrc:    Rectal  SpO2: 98% 98% 100% 100%  Weight:       Constitutional: appears age-appropriate, frail, NAD, calm, comfortable Eyes: PERRL, lids and conjunctivae normal ENMT: Mucous membranes are moist. Posterior pharynx clear of any exudate or lesions. Age-appropriate dentition.  Unable to assess hearing. Neck: normal, supple, no masses, no thyromegaly Respiratory: clear to auscultation bilaterally, no wheezing, no crackles. Normal respiratory effort. No accessory muscle use.  Cardiovascular: Regular rate and rhythm, no murmurs / rubs / gallops. No extremity edema. 2+ pedal pulses. No carotid bruits.  Abdomen: no tenderness, no masses palpated, no hepatosplenomegaly. Bowel sounds positive.  Musculoskeletal: no clubbing / cyanosis. No joint deformity upper and lower extremities. Good ROM, no contractures, no atrophy. Normal muscle tone.  Skin: no rashes, lesions, ulcers. No induration Neurologic: Sensation intact. Strength 5/5 in all 4.  Psychiatric: Lacks judgment and insight consistent with history of advanced dementia.  Patient is awake, not alert to self, location, age, year.  Pleasantly demented mood.   EKG: independently reviewed, showing sinus rhythm with rate of 59, QTc 423  Chest x-ray on Admission: I personally reviewed and I agree with radiologist reading as below.  CT Head Wo Contrast  Result Date: 09/06/2022 CLINICAL DATA:  Provided history: Altered mental status. EXAM: CT HEAD WITHOUT CONTRAST TECHNIQUE: Contiguous axial images were obtained from the base of the skull through the vertex without intravenous contrast. RADIATION DOSE REDUCTION: This exam was performed according to the departmental dose-optimization program which includes automated exposure control, adjustment of the mA and/or kV according to patient size and/or use of iterative reconstruction  technique. COMPARISON:  Head CT 07/27/2022. FINDINGS: Brain: Moderate generalized cerebral atrophy. Mild cerebellar atrophy. Redemonstrated chronic cortical/subcortical infarcts within the right frontal and left occipital lobes. Background moderate patchy and ill-defined hypoattenuation within the cerebral white matter, nonspecific but compatible with chronic small vessel ischemic disease. Redemonstrated chronic infarct within the left cerebellar hemisphere. There is no acute intracranial hemorrhage. No acute demarcated cortical infarct. No extra-axial fluid collection. No evidence of an intracranial mass. No midline shift. Vascular: No hyperdense vessel.  Atherosclerotic calcifications. Skull: Right frontal cranioplasty. Sinuses/Orbits: No mass or acute finding within the imaged orbits. Chronic left maxillary sinus disease (with associated chronic reactive osteitis), similar to the prior examination of 07/27/2022. IMPRESSION: 1.  No evidence of an acute intracranial abnormality. 2. Redemonstrated chronic cortical/subcortical infarcts within the right frontal and left occipital lobes. 3. Redemonstrated chronic infarct within the left cerebellar hemisphere. 4. Background parenchymal atrophy and chronic small vessel ischemic disease,  as described. 5. Chronic left maxillary sinus disease. Electronically Signed   By: Jackey LogeKyle  Golden D.O.   On: 09/06/2022 11:26   DG Chest Port 1 View  Result Date: 09/06/2022 CLINICAL DATA:  [age] year old male with possible sepsis. EXAM: PORTABLE CHEST 1 VIEW COMPARISON:  Chest radiographs 06/29/2021 and earlier. FINDINGS: Portable AP view at 0751 hours. Lower lung volumes and patient significantly rotated to the left. Mediastinal contours appear stable, negative except for or chronic tortuosity of the descending thoracic aorta. Allowing for portable technique the lungs are clear. No pneumothorax or pleural effusion identified. No acute osseous abnormality identified. Negative visible bowel  gas. IMPRESSION: Rotated portable exam with lower lung volumes. No acute cardiopulmonary abnormality. Electronically Signed   By: Odessa FlemingH  Hall M.D.   On: 09/06/2022 07:59    Labs on Admission: I have personally reviewed following labs  CBC: Recent Labs  Lab 09/06/22 0730  WBC 5.8  HGB 13.8  HCT 41.9  MCV 94.4  PLT 259   Basic Metabolic Panel: Recent Labs  Lab 09/06/22 0730  NA 139  K 4.0  CL 109  CO2 24  GLUCOSE 92  BUN 28*  CREATININE 1.13  CALCIUM 8.6*  MG 2.1   GFR: Estimated Creatinine Clearance: 51.1 mL/min (by C-G formula based on SCr of 1.13 mg/dL).  Thyroid Function Tests: Recent Labs    09/06/22 0730  TSH 3.107   Urine analysis:    Component Value Date/Time   COLORURINE YELLOW (A) 09/06/2022 0841   APPEARANCEUR CLEAR (A) 09/06/2022 0841   APPEARANCEUR Clear 07/26/2022 0000   LABSPEC 1.026 09/06/2022 0841   PHURINE 5.0 09/06/2022 0841   GLUCOSEU NEGATIVE 09/06/2022 0841   HGBUR NEGATIVE 09/06/2022 0841   BILIRUBINUR NEGATIVE 09/06/2022 0841   BILIRUBINUR Negative 07/26/2022 0000   KETONESUR NEGATIVE 09/06/2022 0841   PROTEINUR NEGATIVE 09/06/2022 0841   UROBILINOGEN negative (A) 08/27/2019 1410   UROBILINOGEN 1.0 03/26/2010 1448   NITRITE NEGATIVE 09/06/2022 0841   LEUKOCYTESUR NEGATIVE 09/06/2022 0841   This document was prepared using Dragon Voice Recognition software and may include unintentional dictation errors.  Dr. Sedalia Mutaox [facility]  If 7PM-7AM, please contact overnight-coverage provider If 7AM-7PM, please contact day coverage provider [email or web address]  09/06/2022, 5:43 PM

## 2022-09-06 NOTE — ED Notes (Signed)
Pt more awake and alert at this time, pt eating breakfast, safety sitter at bedside.

## 2022-09-06 NOTE — ED Notes (Signed)
Patient is currently resting at this time.  

## 2022-09-06 NOTE — Assessment & Plan Note (Signed)
Initiated Requip trial 0.5 mg nightly, first dose including male, 5 days ordered Patient may require modification of dosing as appropriate per primary team

## 2022-09-06 NOTE — Assessment & Plan Note (Addendum)
With hypotension and hypothermia Discontinue Haldol as needed Check ammonia, B12 level on admission Recheck temperature as appropriate Maintain MAP greater than 65 Status post 2 L bolus per EDP Ordered additional sodium chloride 250 mL bolus one-time dose Blood cultures x 2 are in process Continue LR infusion at 150 mL/h

## 2022-09-06 NOTE — TOC Progression Note (Signed)
Transition of Care [facility]) - Progression Note    Patient Details  Name: Terry Sloan MRN: [id number] Date of Birth: Apr 05, 1941  [facility]) CM/SW Contact  Darleene Cleaver, [place] Phone Number: 09/05/2022 4:45pm  Clinical Narrative:     Per chart review patient still having behavioral challenges and receiving Haldol injections.  SNFs and ALFs can not accept patient if he still has a sitter, and receiving Haldol injections.  TOC to continue to follow patient's progress throughout discharge planning.  Expected Discharge Plan:  (TBD) Barriers to Discharge: Continued Medical Work up  Expected Discharge Plan and Services                                               Social Determinants of Health (SDOH) Interventions SDOH Screenings   Food Insecurity: No Food Insecurity (07/23/2022)  Housing: Low Risk  (07/23/2022)  Transportation Needs: No Transportation Needs (07/23/2022)  Utilities: Not At Risk (07/23/2022)  Alcohol Screen: Low Risk  (07/23/2022)  Depression (PHQ2-9): Low Risk  (07/23/2022)  Recent Concern: Depression (PHQ2-9) - Medium Risk (06/17/2022)  Financial Resource Strain: Low Risk  (07/23/2022)  Physical Activity: Insufficiently Active (07/23/2022)  Social Connections: Socially Isolated (07/23/2022)  Stress: No Stress Concern Present (07/23/2022)  Tobacco Use: Medium Risk (08/16/2022)    Readmission Risk Interventions     No data to display

## 2022-09-06 NOTE — ED Notes (Signed)
Pt eating at this time.

## 2022-09-06 NOTE — TOC Progression Note (Addendum)
Transition of Care [facility]) - Progression Note    Patient Details  Name: Terry Sloan MRN: [id number] Date of Birth: 04-Aug-1940  Transition of Care [facility]) CM/SW Contact  Darleene Cleaver, [place] Phone Number: 09/06/2022, 2:54 PM  Clinical Narrative:     CSW received phone call from patient's daughter [name] [phone].  She reported that they have sold the property for patient and have submitted paperwork to [facility] DSS to start the [facility] application.  Per [name], the Medicaid worker is Rolanda Lundborg, [phone]. [phone], and Sherrie is requesting a copy of the FL2.  CSW attempted to contact Sherrie to find out how to send it to her via email or fax, had to leave a message on voice mail.  Patient still does not have any bed offers due to behaviors.  TOC to continue to follow patient's progress throughout discharge planning.   Expected Discharge Plan:  (TBD) Barriers to Discharge: Continued Medical Work up  Expected Discharge Plan and Services                                               Social Determinants of Health (SDOH) Interventions SDOH Screenings   Food Insecurity: No Food Insecurity (07/23/2022)  Housing: Low Risk  (07/23/2022)  Transportation Needs: No Transportation Needs (07/23/2022)  Utilities: Not At Risk (07/23/2022)  Alcohol Screen: Low Risk  (07/23/2022)  Depression (PHQ2-9): Low Risk  (07/23/2022)  Recent Concern: Depression (PHQ2-9) - Medium Risk (06/17/2022)  Financial Resource Strain: Low Risk  (07/23/2022)  Physical Activity: Insufficiently Active (07/23/2022)  Social Connections: Socially Isolated (07/23/2022)  Stress: No Stress Concern Present (07/23/2022)  Tobacco Use: Medium Risk (08/16/2022)    Readmission Risk Interventions     No data to display

## 2022-09-06 NOTE — Assessment & Plan Note (Signed)
Ativan 0.5 mg p.o. every 6 hours as needed for anxiety, 3 doses ordered

## 2022-09-06 NOTE — Assessment & Plan Note (Addendum)
Resumed home donepezil 5 mg daily, olanzapine 15 mg p.o. twice daily, Depakote 250 mg p.o. twice daily Of note patient has not had behavioral issues for the last 48+ hours Patient's last dose of Haldol was given on 09/04/2022 at 8:51 AM Haldol IM/IV as needed has been discontinued Behavioral health has been consulted for consideration of optimization of psychiatric/behavioral medication including scheduled Haldol p.o. versus other antipsychotic medication per specialist recommendation

## 2022-09-06 NOTE — ED Notes (Signed)
Patient ate 25% of his breakfast meal this morning.

## 2022-09-06 NOTE — ED Notes (Signed)
This tech rounding on pt. Bed sheets, chux, and brief soaked with urine. This NT and NT Faith changed pt, peri care provided, bed cleaned and new sheets, chux, and brief applied. Pt hot to the touch and rectal temp taken. Rectal temp 96.8. RN & MD made aware. Orders provided. Vitals taken with a T- 98.6 P-69 R-14 BP- 83/52 O2- 100% on RA. RN and MD made aware of BP.

## 2022-09-07 DIAGNOSIS — R400 Somnolence: Secondary | ICD-10-CM | POA: Diagnosis not present

## 2022-09-07 LAB — CBC
HCT: 35.6 % — ABNORMAL LOW (ref 39.0–52.0)
Hemoglobin: 11.8 g/dL — ABNORMAL LOW (ref 13.0–17.0)
MCH: 31.1 pg (ref 26.0–34.0)
MCHC: 33.1 g/dL (ref 30.0–36.0)
MCV: 93.9 fL (ref 80.0–100.0)
Platelets: 234 10*3/uL (ref 150–400)
RBC: 3.79 MIL/uL — ABNORMAL LOW (ref 4.22–5.81)
RDW: 11.9 % (ref 11.5–15.5)
WBC: 5.5 10*3/uL (ref 4.0–10.5)
nRBC: 0 % (ref 0.0–0.2)

## 2022-09-07 LAB — BASIC METABOLIC PANEL
Anion gap: 7 (ref 5–15)
BUN: 27 mg/dL — ABNORMAL HIGH (ref 8–23)
CO2: 24 mmol/L (ref 22–32)
Calcium: 8.5 mg/dL — ABNORMAL LOW (ref 8.9–10.3)
Chloride: 108 mmol/L (ref 98–111)
Creatinine, Ser: 1.17 mg/dL (ref 0.61–1.24)
GFR, Estimated: >60 mL/min (ref >60)
Glucose, Bld: 89 mg/dL (ref 70–99)
Potassium: 3.9 mmol/L (ref 3.5–5.1)
Sodium: 139 mmol/L (ref 135–145)

## 2022-09-07 LAB — VITAMIN B12: Vitamin B-12: 167 pg/mL — ABNORMAL LOW (ref 180–914)

## 2022-09-07 LAB — MRSA NEXT GEN BY PCR, NASAL: MRSA by PCR Next Gen: NOT DETECTED

## 2022-09-07 LAB — PROCALCITONIN: Procalcitonin: <0.1 ng/mL

## 2022-09-07 MED ORDER — VITAMIN B-12 1000 MCG PO TABS
1000.0000 ug | ORAL_TABLET | Freq: Every day | ORAL | Status: DC
  Administered 2022-09-07 – 2022-09-11 (×5): 1000 ug via ORAL
  Filled 2022-09-07 (×5): qty 1

## 2022-09-07 NOTE — Evaluation (Signed)
Occupational Therapy Evaluation Patient Details Name: Terry Sloan MRN: [id number] DOB: [birth year] Today's Date: 09/07/2022   History of Present Illness per chart: [name] is 82 year old male is a 82 year old male with history of dementia, behavioral disturbance, hyperlipidemia, BPH, insomnia, depression, anxiety, who presents to emergency department from [facility] rehab dementia department on 08/08/2022 for chief concerns of aggression with aggressive outburst towards staff. On day of admission, nursing noticed that patient's temperature was low, decrease responsiveness/change in mentation, and hypotension.   Clinical Impression   Pt seen for OT evaluation this date, sitter present during session, no family present to provide history.  Pt did not verbalize during session, he did appear agitated at times, trying to get up out of recliner chair without assist.  Pt with forward flexed posture with head down.  Sitter reports he was able to feed self this am and drink from a cup.  Pt ambulated in the hallway earlier with PT. Attempted to have patient participate in simple grooming today however, he was unable to follow commands and did not respond to wet cloth placed in his hand with directions to wash hands and/or face.  Pt is incontinent of bowel and bladder and sitter reports pt does not appear to know how to use urinal with attempts.  Pt does not appear appropriate for skilled OT services at this time and will need total care for bathing and dressing skills.  Please re consult if needs arise.        Recommendations for follow up therapy are one component of a multi-disciplinary discharge planning process, led by the attending physician.  Recommendations may be updated based on patient status, additional functional criteria and insurance authorization.   Assistance Recommended at Discharge Frequent or constant Supervision/Assistance  Patient can return home with the following A lot of help with  bathing/dressing/bathroom;A lot of help with walking and/or transfers;Direct supervision/assist for medications management;Direct supervision/assist for financial management    Functional Status Assessment  Patient has had a recent decline in their functional status and/or demonstrates limited ability to make significant improvements in function in a reasonable and predictable amount of time  Equipment Recommendations  None recommended by OT    Recommendations for Other Services       Precautions / Restrictions Precautions Precautions: Fall Restrictions Weight Bearing Restrictions: No      Mobility Bed Mobility                    Transfers                          Balance                                           ADL either performed or assessed with clinical judgement   ADL Overall ADL's : Needs assistance/impaired   Eating/Feeding Details (indicate cue type and reason): Sitter reports patient fed self this am and was able to drink from a cup without assist other than tray set up Grooming: Maximal assistance Grooming Details (indicate cue type and reason): Wet cloth placed in hand, pt with no attempts to wipe hands or face even with cues. Upper Body Bathing: Total assistance   Lower Body Bathing: Total assistance   Upper Body Dressing : Maximal assistance   Lower Body Dressing: Total assistance  Toilet Transfer Details (indicate cue type and reason): Did not perform, sitter reports incontinence since beginning of her shift this date.  PT also reported bed was wet on arrival this morning.           General ADL Comments: See PT note for mobility.  Pt did not follow commands during OT evaluation this date and difficult to determine level of function at this time.     Vision         Perception     Praxis      Pertinent Vitals/Pain Pain Assessment Facial Expression: smiling or inexpressive Body Language: relaxed      Hand Dominance Right   Extremity/Trunk Assessment Upper Extremity Assessment Upper Extremity Assessment: Overall WFL for tasks assessed   Lower Extremity Assessment Lower Extremity Assessment: Defer to PT evaluation   Cervical / Trunk Assessment Cervical / Trunk Assessment: Kyphotic   Communication     Cognition Arousal/Alertness: Lethargic Behavior During Therapy:  (pt slightly agitated trying to get up from the chair without assist, sitter present in room) Overall Cognitive Status: No family/caregiver present to determine baseline cognitive functioning                                 General Comments: Pt did not follow commands during evaluation, wet cloth placed in his hand and instructed to wash hands and face however he did not perform.     General Comments       Exercises     Shoulder Instructions      Home Living Family/patient expects to be discharged to:: Skilled nursing facility                                 Additional Comments: Pt unable to provide information regarding his history, per chart pt was recently residing in a dementia care unit in [place].      Prior Functioning/Environment Prior Level of Function : Patient poor historian/Family not available               ADLs Comments: Chart indicates pt required assistance in the past with self care tasks.        OT Problem List: Decreased cognition;Decreased safety awareness      OT Treatment/Interventions:      OT Goals(Current goals can be found in the care plan section) Acute Rehab OT Goals Patient Stated Goal: pt unable to state goal Potential to Achieve Goals: Poor  OT Frequency:      Co-evaluation              AM-PAC OT "6 Clicks" Daily Activity     Outcome Measure Help from another person eating meals?: None Help from another person taking care of personal grooming?: A Lot Help from another person toileting, which includes using toliet,  bedpan, or urinal?: A Lot Help from another person bathing (including washing, rinsing, drying)?: Total Help from another person to put on and taking off regular upper body clothing?: A Lot Help from another person to put on and taking off regular lower body clothing?: Total 6 Click Score: 12   End of Session    Activity Tolerance: Treatment limited secondary to agitation Patient left: in chair;with call bell/phone within reach;with chair alarm set;with nursing/sitter in room  OT Visit Diagnosis: Cognitive communication deficit (R41.841);Muscle weakness (generalized) (M62.81)  Time: 5072-2575 OT Time Calculation (min): 11 min Charges:  OT General Charges $OT Visit: 1 Visit OT Evaluation $OT Eval Low Complexity: 1 Low Terry Sloan Terry Sloan, OTR/L, CLT Terry Sloan 09/07/2022, 12:03 PM

## 2022-09-07 NOTE — Evaluation (Signed)
Physical Therapy Evaluation Patient Details Name: Terry Sloan MRN: [id number] DOB: 02/22/1941 Today's Date: 09/07/2022  History of Present Illness  82 year old male is a 82 year old male with history of dementia, behavioral disturbance, hyperlipidemia, BPH, insomnia, depression, anxiety, who presented to emergency department from [facility] rehab dementia department on 08/08/2022 for chief concerns of aggression with aggressive outburst towards staff. On day of admission, nursing noticed that patient's temperature was low, decrease responsiveness/change in mentation, and hypotension.  Clinical Impression  Pt with limited 2-way interaction but did show effort with following simple, 1-step instructions.  He was able to circumambulate the nurses' station but showed minimal ability to carry-over cuing for walker use, posture, etc after direct assist to increase safety and efficiency with prolonged bout of ambulation.  Pt with no LOBs but general unsteadiness and poor safety awareness t/o the session.  Pt clearly needing close 24/7 assist at this time.      Recommendations for follow up therapy are one component of a multi-disciplinary discharge planning process, led by the attending physician.  Recommendations may be updated based on patient status, additional functional criteria and insurance authorization.  Follow Up Recommendations Can patient physically be transported by private vehicle: Yes     Assistance Recommended at Discharge Frequent or constant Supervision/Assistance  Patient can return home with the following  A lot of help with walking and/or transfers;A lot of help with bathing/dressing/bathroom;Assistance with cooking/housework;Help with stairs or ramp for entrance    Equipment Recommendations  (TBD)  Recommendations for Other Services       Functional Status Assessment Patient has had a recent decline in their functional status and demonstrates the ability to make significant  improvements in function in a reasonable and predictable amount of time.     Precautions / Restrictions Precautions Precautions: Fall Restrictions Weight Bearing Restrictions: No      Mobility  Bed Mobility Overal bed mobility: Needs Assistance Bed Mobility: Supine to Sit     Supine to sit: Min guard     General bed mobility comments: increased time, labored movement, able to get to seated EOB w/o direct assist but needing plenty of encouragement    Transfers Overall transfer level: Needs assistance Equipment used: Rolling walker (2 wheels) Transfers: Sit to/from Stand Sit to Stand: Min assist           General transfer comment: slow to initiate upward movement, light assist to help initiate and he was able to maintain upward momentum with tactile cues    Ambulation/Gait Ambulation/Gait assistance: Min assist Gait Distance (Feet): 200 Feet Assistive device: Rolling walker (2 wheels)         General Gait Details: Pt with poor general awarness but able to do prolonged bout of ambulation with close supervision the entire time and occasionall min assist to get back "upright" or into the walker as he consistently would slowly lean forward more and more despite cuing and light assist to correct.  he managed ~15 of ambulation w/o UE but was neither steady nor safety with this effort.  Pt did not have any overt LOBs but lack of awarness was a real and significant concern t/o the effort.  Chair follow for safety.  Stairs            Wheelchair Mobility    Modified Rankin (Stroke Patients Only)       Balance Overall balance assessment: Needs assistance Sitting-balance support: Feet supported, No upper extremity supported Sitting balance-Leahy Scale: Good  Standing balance support: During functional activity, Bilateral upper extremity supported, Reliant on assistive device for balance Standing balance-Leahy Scale: Poor Standing balance comment: poor awareness  generally as well as with AD use                             Pertinent Vitals/Pain Pain Assessment Pain Assessment: Faces Faces Pain Scale: No hurt    Home Living Family/patient expects to be discharged to:: Unsure Living Arrangements:  (ALF memory care)                 Additional Comments: Pt unable to provide information regarding his history, per chart pt was living at home until ~1 month ago and recently briefly residing in a dementia care unit in [place]. boarder in ED ~1 month    Prior Function Prior Level of Function : Patient poor historian/Family not available               ADLs Comments: Chart indicates pt required assistance in the past with self care tasks.     Hand Dominance   Dominant Hand: Right    Extremity/Trunk Assessment   Upper Extremity Assessment Upper Extremity Assessment: Overall WFL for tasks assessed    Lower Extremity Assessment Lower Extremity Assessment: Overall WFL for tasks assessed    Cervical / Trunk Assessment Cervical / Trunk Assessment: Kyphotic  Communication   Communication:  (pt minimally interactive, difficulty saying much of anyting)  Cognition Arousal/Alertness: Awake/alert Behavior During Therapy: Restless Overall Cognitive Status: No family/caregiver present to determine baseline cognitive functioning                                 General Comments: Pt struggled to follow all but the most basic cues, did not answer questions        General Comments General comments (skin integrity, edema, etc.): Pt clearly confused and inable to follow multi-step instruction - with direct simple cuing still needed excessive assist to stay on task and maintain safety    Exercises     Assessment/Plan    PT Assessment Patient needs continued PT services  PT Problem List Decreased strength;Decreased activity tolerance;Decreased balance;Decreased mobility       PT Treatment Interventions DME  instruction;Gait training;Stair training;Functional mobility training;Therapeutic activities;Therapeutic exercise;Balance training;Patient/family education    PT Goals (Current goals can be found in the Care Plan section)  Acute Rehab PT Goals Patient Stated Goal: none stated, minimal verbaliztion PT Goal Formulation: With patient Time For Goal Achievement: 09/20/22 Potential to Achieve Goals: Fair    Frequency Min 2X/week     Co-evaluation               AM-PAC PT "6 Clicks" Mobility  Outcome Measure Help needed turning from your back to your side while in a flat bed without using bedrails?: A Little Help needed moving from lying on your back to sitting on the side of a flat bed without using bedrails?: A Little Help needed moving to and from a bed to a chair (including a wheelchair)?: A Lot Help needed standing up from a chair using your arms (e.g., wheelchair or bedside chair)?: A Lot Help needed to walk in hospital room?: A Lot Help needed climbing 3-5 steps with a railing? : A Lot 6 Click Score: 14    End of Session Equipment Utilized During Treatment: Gait belt Activity Tolerance: Patient  tolerated treatment well Patient left: with call bell/phone within reach;with chair alarm set Psychiatrist) Nurse Communication: Mobility status PT Visit Diagnosis: Unsteadiness on feet (R26.81);Muscle weakness (generalized) (M62.81);Other abnormalities of gait and mobility (R26.89)    Time: 5859-2924 PT Time Calculation (min) (ACUTE ONLY): 24 min   Charges:   PT Evaluation $PT Eval Low Complexity: 1 Low PT Treatments $Gait Training: 8-22 mins        Malachi Pro, DPT 09/07/2022, 1:35 PM

## 2022-09-07 NOTE — Progress Notes (Signed)
PROGRESS NOTE    Terry CornerOtis L Sloan  AVW:[id number] DOB: [birth year] DOA: 08/08/2022 PCP: Pcp, No    Brief Narrative:  82 year old male is a [age] year old male with history of dementia, behavioral disturbance, hyperlipidemia, BPH, insomnia, depression, anxiety, who [facility]/2022 for chief concerns of aggression with aggressive outburst towards staff.   Patient was initially IVC.  Behavioral health was consulted and determined that patient was not a danger to self or others.  Patient was pending placement, however was declined due to as needed Haldol doses were given.   On day of admission, nursing noticed that patient's temperature was low, decrease responsiveness/change in mentation, and hypotension.   Serum sodium on day of admission was 139, potassium 4.0, chloride 109, bicarb 24, BUN of 25, serum creatinine 1.13, eGFR greater than 60, nonfasting blood glucose 92, WBC 5.8, hemoglobin 13.8, platelets of 259.   Blood cultures, are in process.  Procalcitonin was less than 0.10, lactic acid was within normal limits.  Magnesium level was 2.1.  UA was negative for leukocytes and nitrates.   Portable chest x-ray was read as rotated portable exam with lower lung volumes.  CT of the head without contrast: Was read as no evidence of acute intracranial abnormality.  Redemonstrated chronic cortical/subcortical infarcts within the right frontal and left occipital lobes.  Redemonstrated chronic infarct within the left cerebellar hemisphere.   Per EDP patient has not required one-to-one sitter over the last few days.  4/6: Patient admitted to the hospitalist service.  Hypothermia and decreased level of responsiveness of completely resolved   Assessment & Plan:   Principal Problem:   Altered mental status Active Problems:   Mixed hyperlipidemia   History of TIA (transient ischemic attack)   Anxiety and depression   BPH (benign  prostatic hyperplasia)   OAB (overactive bladder)   Dementia with behavioral disturbance   Hypothermia   Restless leg  Altered mental status With hypotension and hypothermia Discontinue Haldol as needed Ammonia normal B12 low, will give supplementation Vitals per unit protocol Discontinue IV fluids Monitor mental status  Restless leg Requip trial 0.5 mg nightly   Hypothermia Resolved   Dementia with behavioral disturbance Resumed home donepezil 5 mg daily, olanzapine 15 mg p.o. twice daily, Depakote 250 mg p.o. twice daily Of note patient has not had behavioral issues for the last 48+ hours Patient's last dose of Haldol was given on 09/04/2022 at 8:51 AM Haldol IM/IV as needed has been discontinued Behavioral health has been consulted for consideration of optimization of psychiatric/behavioral medication including scheduled Haldol p.o. versus other antipsychotic medication per specialist recommendation   BPH (benign prostatic hyperplasia) Flomax 0.4 mg nightly   Anxiety and depression Ativan 0.5 mg p.o. every 6 hours as needed for anxiety, 3 doses ordered  DVT prophylaxis: Lovenox Code Status: Full Family Communication: None Disposition Plan: Status is: Inpatient Remains inpatient appropriate because: Unsafe discharge plan   Level of care: Telemetry Medical  Consultants:  Psychiatry Procedures:  None  Antimicrobials: None   Subjective: Seen and examined.  Sitting up in chair.  No visible distress  Objective: Vitals:   09/07/22 0057 09/07/22 0500 09/07/22 0752 09/07/22 0753  BP: 109/63 132/69 115/64   Pulse: 82 74 97   Resp: 18 18 17    Temp: 98 F (36.7 C) 97.8 F (36.6 C) 97.8 F (36.6 C)   TempSrc: Oral     SpO2:  99% (!) 83% 98%  Weight: 78.2  kg     Height: 5\' 10"  (1.778 m)       Intake/Output Summary (Last 24 hours) at 09/07/2022 1106 Last data filed at 09/07/2022 0504 Gross per 24 hour  Intake 390 ml  Output 300 ml  Net 90 ml   Filed Weights    08/08/22 1205 08/14/22 0038 09/07/22 0057  Weight: 81 kg 78.2 kg 78.2 kg    Examination:  General exam: NAD.  Pleasantly confused Respiratory system: Clear to auscultation. Respiratory effort normal. Cardiovascular system: S1-S2, RRR, no murmurs, no pedal edema Gastrointestinal system: Soft, NT/ND, normal bowel sounds Central nervous system: Alert, oriented x 2, no focal deficits Extremities: Symmetric 5 x 5 power. Skin: No rashes, lesions or ulcers Psychiatry: Judgement and insight appear normal. Mood & affect appropriate.     Data Reviewed: I have personally reviewed following labs and imaging studies  CBC: Recent Labs  Lab 09/06/22 0730 09/07/22 0526  WBC 5.8 5.5  HGB 13.8 11.8*  HCT 41.9 35.6*  MCV 94.4 93.9  PLT 259 234   Basic Metabolic Panel: Recent Labs  Lab 09/06/22 0730 09/07/22 0526  NA 139 139  K 4.0 3.9  CL 109 108  CO2 24 24  GLUCOSE 92 89  BUN 28* 27*  CREATININE 1.13 1.17  CALCIUM 8.6* 8.5*  MG 2.1  --    GFR: Estimated Creatinine Clearance: 49.4 mL/min (by C-G formula based on SCr of 1.17 mg/dL). Liver Function Tests: No results for input(s): "AST", "ALT", "ALKPHOS", "BILITOT", "PROT", "ALBUMIN" in the last 168 hours. No results for input(s): "LIPASE", "AMYLASE" in the last 168 hours. Recent Labs  Lab 09/06/22 1907  AMMONIA 17   Coagulation Profile: No results for input(s): "INR", "PROTIME" in the last 168 hours. Cardiac Enzymes: No results for input(s): "CKTOTAL", "CKMB", "CKMBINDEX", "TROPONINI" in the last 168 hours. BNP (last 3 results) No results for input(s): "PROBNP" in the last 8760 hours. HbA1C: No results for input(s): "HGBA1C" in the last 72 hours. CBG: No results for input(s): "GLUCAP" in the last 168 hours. Lipid Profile: No results for input(s): "CHOL", "HDL", "LDLCALC", "TRIG", "CHOLHDL", "LDLDIRECT" in the last 72 hours. Thyroid Function Tests: Recent Labs    09/06/22 0730  TSH 3.107   Anemia Panel: Recent  Labs    09/06/22 1907  VITAMINB12 167*   Sepsis Labs: Recent Labs  Lab 09/06/22 0730 09/06/22 1004 09/06/22 1907 09/07/22 0526  PROCALCITON  --  <0.10  --  <0.10  LATICACIDVEN 1.4  --  1.7  --     Recent Results (from the past 240 hour(s))  SARS Coronavirus 2 by RT PCR (hospital order, performed in [facility] hospital lab) *cepheid single result test* Anterior Nasal Swab     Status: None   Collection Time: 09/06/22  7:35 AM   Specimen: Anterior Nasal Swab  Result Value Ref Range Status   SARS Coronavirus 2 by RT PCR NEGATIVE NEGATIVE Final    Comment: (NOTE) SARS-CoV-2 target nucleic acids are NOT DETECTED.  The SARS-CoV-2 RNA is generally detectable in upper and lower respiratory specimens during the acute phase of infection. The lowest concentration of SARS-CoV-2 viral copies this assay can detect is 250 copies / mL. A negative result does not preclude SARS-CoV-2 infection and should not be used as the sole basis for treatment or other patient management decisions.  A negative result may occur with improper specimen collection / handling, submission of specimen other than nasopharyngeal swab, presence of viral mutation(s) within the areas targeted by  this assay, and inadequate number of viral copies (<250 copies / mL). A negative result must be combined with clinical observations, patient history, and epidemiological information.  Fact Sheet for Patients:   RoadLapTop.co.zahttps://www.fda.gov/media/158405/download  Fact Sheet for Healthcare Providers: http://kim-miller.com/https://www.fda.gov/media/158404/download  This test is not yet approved or  cleared by the [place] FDA and has been authorized for detection and/or diagnosis of SARS-CoV-2 by FDA under an Emergency Use Authorization (EUA).  This EUA will remain in effect (meaning this test can be used) for the duration of the COVID-19 declaration under Section 564(b)(1) of the Act, 21 U.S.C. section 360bbb-3(b)(1), unless the authorization is  terminated or revoked sooner.  Performed at [facility], [address] [place], [place] [address]   Blood Culture (routine x 2)     Status: None (Preliminary result)   Collection Time: 09/06/22  7:38 AM   Specimen: BLOOD  Result Value Ref Range Status   Specimen Description BLOOD LEFT ANTECUBITAL  Final   Special Requests   Final    BOTTLES DRAWN AEROBIC AND ANAEROBIC Blood Culture results may not be optimal due to an inadequate volume of blood received in culture bottles   Culture   Final    NO GROWTH < 24 HOURS Performed at [facility], [address] [place], [place] [address]    Report Status PENDING  Incomplete  Blood Culture (routine x 2)     Status: None (Preliminary result)   Collection Time: 09/06/22  9:18 AM   Specimen: BLOOD  Result Value Ref Range Status   Specimen Description [facility]  Final   Special Requests   Final    BOTTLES DRAWN AEROBIC AND ANAEROBIC Blood Culture adequate volume   Culture   Final    NO GROWTH < 24 HOURS Performed at [facility], [address] [place], [place] [address]    Report Status PENDING  Incomplete  MRSA Next Gen by PCR, Nasal     Status: None   Collection Time: 09/07/22  6:00 AM   Specimen: Nasal Mucosa; Nasal Swab  Result Value Ref Range Status   MRSA by PCR Next Gen NOT DETECTED NOT DETECTED Final    Comment: (NOTE) The GeneXpert MRSA Assay (FDA approved for NASAL specimens only), is one component of a comprehensive MRSA colonization surveillance program. It is not intended to diagnose MRSA infection nor to guide or monitor treatment for MRSA infections. Test performance is not FDA approved in patients less than 83 years old. Performed at [facility], [address] [place], [place] [address]          Radiology Studies: CT Head Wo Contrast  Result Date: 09/06/2022 CLINICAL DATA:  Provided history: Altered mental status. EXAM: CT HEAD WITHOUT CONTRAST TECHNIQUE:  Contiguous axial images were obtained from the base of the skull through the vertex without intravenous contrast. RADIATION DOSE REDUCTION: This exam was performed according to the departmental dose-optimization program which includes automated exposure control, adjustment of the mA and/or kV according to patient size and/or use of iterative reconstruction technique. COMPARISON:  Head CT 07/27/2022. FINDINGS: Brain: Moderate generalized cerebral atrophy. Mild cerebellar atrophy. Redemonstrated chronic cortical/subcortical infarcts within the right frontal and left occipital lobes. Background moderate patchy and ill-defined hypoattenuation within the cerebral white matter, nonspecific but compatible with chronic small vessel ischemic disease. Redemonstrated chronic infarct within the left cerebellar hemisphere. There is no acute intracranial hemorrhage. No acute demarcated cortical infarct. No extra-axial fluid collection. No evidence of an  intracranial mass. No midline shift. Vascular: No hyperdense vessel.  Atherosclerotic calcifications. Skull: Right frontal cranioplasty. Sinuses/Orbits: No mass or acute finding within the imaged orbits. Chronic left maxillary sinus disease (with associated chronic reactive osteitis), similar to the prior examination of 07/27/2022. IMPRESSION: 1.  No evidence of an acute intracranial abnormality. 2. Redemonstrated chronic cortical/subcortical infarcts within the right frontal and left occipital lobes. 3. Redemonstrated chronic infarct within the left cerebellar hemisphere. 4. Background parenchymal atrophy and chronic small vessel ischemic disease, as described. 5. Chronic left maxillary sinus disease. Electronically Signed   By: Jackey Loge D.O.   On: 09/06/2022 11:26   DG Chest Port 1 View  Result Date: 09/06/2022 CLINICAL DATA:  82 year old male with possible sepsis. EXAM: PORTABLE CHEST 1 VIEW COMPARISON:  Chest radiographs 06/29/2021 and earlier. FINDINGS: Portable AP  view at 0751 hours. Lower lung volumes and patient significantly rotated to the left. Mediastinal contours appear stable, negative except for or chronic tortuosity of the descending thoracic aorta. Allowing for portable technique the lungs are clear. No pneumothorax or pleural effusion identified. No acute osseous abnormality identified. Negative visible bowel gas. IMPRESSION: Rotated portable exam with lower lung volumes. No acute cardiopulmonary abnormality. Electronically Signed   By: Odessa Fleming M.D.   On: 09/06/2022 07:59        Scheduled Meds:  divalproex  250 mg Oral Q12H   donepezil  5 mg Oral Daily   enoxaparin (LOVENOX) injection  40 mg Subcutaneous Q24H   feeding supplement  237 mL Oral BID BM   melatonin  10 mg Oral QHS   OLANZapine  15 mg Oral BID   pravastatin  40 mg Oral QHS   rOPINIRole  0.5 mg Oral QHS   tamsulosin  0.4 mg Oral QPC supper   Continuous Infusions:   LOS: 1 day     Tresa Moore, MD [facility]   If 7PM-7AM, please contact night-coverage  09/07/2022, 11:06 AM

## 2022-09-08 DIAGNOSIS — R400 Somnolence: Secondary | ICD-10-CM | POA: Diagnosis not present

## 2022-09-08 MED ORDER — ZIPRASIDONE MESYLATE 20 MG IM SOLR
10.0000 mg | Freq: Once | INTRAMUSCULAR | Status: AC
  Administered 2022-09-08: 10 mg via INTRAMUSCULAR
  Filled 2022-09-08: qty 20

## 2022-09-08 NOTE — Progress Notes (Signed)
PROGRESS NOTE    Terry Sloan  GEX:[id number] DOB: 03/15/1941 DOA: 08/08/2022 PCP: Pcp, No    Brief Narrative:  82 year old male is a 82 year old male with history of dementia, behavioral disturbance, hyperlipidemia, BPH, insomnia, depression, anxiety, who presents to emergency department from [facility] rehab dementia department on 08/08/2022 for chief concerns of aggression with aggressive outburst towards staff.   Patient was initially IVC.  Behavioral health was consulted and determined that patient was not a danger to self or others.  Patient was pending placement, however was declined due to as needed Haldol doses were given.   On day of admission, nursing noticed that patient's temperature was low, decrease responsiveness/change in mentation, and hypotension.   Serum sodium on day of admission was 139, potassium 4.0, chloride 109, bicarb 24, BUN of 25, serum creatinine 1.13, eGFR greater than 60, nonfasting blood glucose 92, WBC 5.8, hemoglobin 13.8, platelets of 259.   Blood cultures, are in process.  Procalcitonin was less than 0.10, lactic acid was within normal limits.  Magnesium level was 2.1.  UA was negative for leukocytes and nitrates.   Portable chest x-ray was read as rotated portable exam with lower lung volumes.  CT of the head without contrast: Was read as no evidence of acute intracranial abnormality.  Redemonstrated chronic cortical/subcortical infarcts within the right frontal and left occipital lobes.  Redemonstrated chronic infarct within the left cerebellar hemisphere.   Per EDP patient has not required one-to-one sitter over the last few days.  4/6: Patient admitted to the hospitalist service.  Hypothermia and decreased level of responsiveness of completely resolved   Assessment & Plan:   Principal Problem:   Altered mental status Active Problems:   Mixed hyperlipidemia   History of TIA (transient ischemic attack)   Anxiety and depression   BPH (benign  prostatic hyperplasia)   OAB (overactive bladder)   Dementia with behavioral disturbance   Hypothermia   Restless leg  Altered mental status With hypotension and hypothermia Has not required Haldol Ammonia normal B12 low, will give supplementation Vitals per unit protocol Discontinue IV fluids Monitor mental status Medically patient is appropriate for discharge.  He will need a long-term care facility.  Restless leg Requip trial 0.5 mg nightly   Hypothermia Resolved   Dementia with behavioral disturbance Resumed home donepezil 5 mg daily, olanzapine 15 mg p.o. twice daily, Depakote 250 mg p.o. twice daily Of note patient has not had behavioral issues for the last 48+ hours Patient's last dose of Haldol was given on 09/04/2022 at 8:51 AM Haldol IM/IV as needed has been discontinued Behavioral health has been consulted for consideration of optimization of psychiatric/behavioral medication including scheduled Haldol p.o. versus other antipsychotic medication per specialist recommendation   BPH (benign prostatic hyperplasia) Flomax 0.4 mg nightly   Anxiety and depression Ativan 0.5 mg p.o. every 6 hours as needed for anxiety, 3 doses ordered  DVT prophylaxis: Lovenox Code Status: Full Family Communication: None Disposition Plan: Status is: Inpatient Remains inpatient appropriate because: Unsafe discharge plan   Level of care: Telemetry Medical  Consultants:  Psychiatry Procedures:  None  Antimicrobials: None   Subjective: Seen and examined.  Lying in bed.  No visible distress.  Sitter at bedside.  Objective: Vitals:   09/07/22 0753 09/07/22 1619 09/08/22 0456 09/08/22 0828  BP:  102/80 (!) 131/54 (!) 104/53  Pulse:  79 60 (!) 105  Resp:  17 19 16   Temp:  97.6 F (36.4 C) 98.7 F (37.1 C)  TempSrc:  Axillary    SpO2: 98% 97% 99% (!) 73%  Weight:      Height:        Intake/Output Summary (Last 24 hours) at 09/08/2022 1032 Last data filed at 09/08/2022  0915 Gross per 24 hour  Intake 480 ml  Output 1600 ml  Net -1120 ml   Filed Weights   08/08/22 1205 08/14/22 0038 09/07/22 0057  Weight: 81 kg 78.2 kg 78.2 kg    Examination:  General exam: No acute distress Respiratory system: Clear to auscultation. Respiratory effort normal. Cardiovascular system: S1-S2, RRR, no murmurs, no pedal edema Gastrointestinal system: Soft, NT/ND, normal bowel sounds Central nervous system: Alert, oriented x 2, no focal deficits Extremities: Symmetric 5 x 5 power. Skin: No rashes, lesions or ulcers Psychiatry: Judgement and insight appear impaired. Mood & affect confused.     Data Reviewed: I have personally reviewed following labs and imaging studies  CBC: Recent Labs  Lab 09/06/22 0730 09/07/22 0526  WBC 5.8 5.5  HGB 13.8 11.8*  HCT 41.9 35.6*  MCV 94.4 93.9  PLT 259 234   Basic Metabolic Panel: Recent Labs  Lab 09/06/22 0730 09/07/22 0526  NA 139 139  K 4.0 3.9  CL 109 108  CO2 24 24  GLUCOSE 92 89  BUN 28* 27*  CREATININE 1.13 1.17  CALCIUM 8.6* 8.5*  MG 2.1  --    GFR: Estimated Creatinine Clearance: 49.4 mL/min (by C-G formula based on SCr of 1.17 mg/dL). Liver Function Tests: No results for input(s): "AST", "ALT", "ALKPHOS", "BILITOT", "PROT", "ALBUMIN" in the last 168 hours. No results for input(s): "LIPASE", "AMYLASE" in the last 168 hours. Recent Labs  Lab 09/06/22 1907  AMMONIA 17   Coagulation Profile: No results for input(s): "INR", "PROTIME" in the last 168 hours. Cardiac Enzymes: No results for input(s): "CKTOTAL", "CKMB", "CKMBINDEX", "TROPONINI" in the last 168 hours. BNP (last 3 results) No results for input(s): "PROBNP" in the last 8760 hours. HbA1C: No results for input(s): "HGBA1C" in the last 72 hours. CBG: No results for input(s): "GLUCAP" in the last 168 hours. Lipid Profile: No results for input(s): "CHOL", "HDL", "LDLCALC", "TRIG", "CHOLHDL", "LDLDIRECT" in the last 72 hours. Thyroid  Function Tests: Recent Labs    09/06/22 0730  TSH 3.107   Anemia Panel: Recent Labs    09/06/22 1907  VITAMINB12 167*   Sepsis Labs: Recent Labs  Lab 09/06/22 0730 09/06/22 1004 09/06/22 1907 09/07/22 0526  PROCALCITON  --  <0.10  --  <0.10  LATICACIDVEN 1.4  --  1.7  --     Recent Results (from the past 240 hour(s))  SARS Coronavirus 2 by RT PCR (hospital order, performed in [facility] hospital lab) *cepheid single result test* Anterior Nasal Swab     Status: None   Collection Time: 09/06/22  7:35 AM   Specimen: Anterior Nasal Swab  Result Value Ref Range Status   SARS Coronavirus 2 by RT PCR NEGATIVE NEGATIVE Final    Comment: (NOTE) SARS-CoV-2 target nucleic acids are NOT DETECTED.  The SARS-CoV-2 RNA is generally detectable in upper and lower respiratory specimens during the acute phase of infection. The lowest concentration of SARS-CoV-2 viral copies this assay can detect is 250 copies / mL. A negative result does not preclude SARS-CoV-2 infection and should not be used as the sole basis for treatment or other patient management decisions.  A negative result may occur with improper specimen collection / handling, submission of specimen other than nasopharyngeal  swab, presence of viral mutation(s) within the areas targeted by this assay, and inadequate number of viral copies (<250 copies / mL). A negative result must be combined with clinical observations, patient history, and epidemiological information.  Fact Sheet for Patients:   RoadLapTop.co.zahttps://www.fda.gov/media/158405/download  Fact Sheet for Healthcare Providers: http://kim-miller.com/https://www.fda.gov/media/158404/download  This test is not yet approved or  cleared by the [place] FDA and has been authorized for detection and/or diagnosis of SARS-CoV-2 by FDA under an Emergency Use Authorization (EUA).  This EUA will remain in effect (meaning this test can be used) for the duration of the COVID-19 declaration under Section  564(b)(1) of the Act, 21 U.S.C. section 360bbb-3(b)(1), unless the authorization is terminated or revoked sooner.  Performed at [facility], [address] [place], [place] [address]   Blood Culture (routine x 2)     Status: None (Preliminary result)   Collection Time: 09/06/22  7:38 AM   Specimen: BLOOD  Result Value Ref Range Status   Specimen Description BLOOD LEFT ANTECUBITAL  Final   Special Requests   Final    BOTTLES DRAWN AEROBIC AND ANAEROBIC Blood Culture results may not be optimal due to an inadequate volume of blood received in culture bottles   Culture   Final    NO GROWTH 2 DAYS Performed at [facility], [address] [place], [place] [address]    Report Status PENDING  Incomplete  Blood Culture (routine x 2)     Status: None (Preliminary result)   Collection Time: 09/06/22  9:18 AM   Specimen: BLOOD  Result Value Ref Range Status   Specimen Description BLOOD RIGHT [facility]  Final   Special Requests   Final    BOTTLES DRAWN AEROBIC AND ANAEROBIC Blood Culture adequate volume   Culture   Final    NO GROWTH 2 DAYS Performed at [facility], [address] [place], [place] [address]    Report Status PENDING  Incomplete  MRSA Next Gen by PCR, Nasal     Status: None   Collection Time: 09/07/22  6:00 AM   Specimen: Nasal Mucosa; Nasal Swab  Result Value Ref Range Status   MRSA by PCR Next Gen NOT DETECTED NOT DETECTED Final    Comment: (NOTE) The GeneXpert MRSA Assay (FDA approved for NASAL specimens only), is one component of a comprehensive MRSA colonization surveillance program. It is not intended to diagnose MRSA infection nor to guide or monitor treatment for MRSA infections. Test performance is not FDA approved in patients less than 83 years old. Performed at [facility], [address] [place], [place] [address]          Radiology Studies: CT Head Wo Contrast  Result Date: 09/06/2022 CLINICAL DATA:  Provided  history: Altered mental status. EXAM: CT HEAD WITHOUT CONTRAST TECHNIQUE: Contiguous axial images were obtained from the base of the skull through the vertex without intravenous contrast. RADIATION DOSE REDUCTION: This exam was performed according to the departmental dose-optimization program which includes automated exposure control, adjustment of the mA and/or kV according to patient size and/or use of iterative reconstruction technique. COMPARISON:  Head CT 07/27/2022. FINDINGS: Brain: Moderate generalized cerebral atrophy. Mild cerebellar atrophy. Redemonstrated chronic cortical/subcortical infarcts within the right frontal and left occipital lobes. Background moderate patchy and ill-defined hypoattenuation within the cerebral white matter, nonspecific but compatible with chronic small vessel ischemic disease. Redemonstrated chronic infarct within the left cerebellar hemisphere. There is no acute intracranial hemorrhage. No acute demarcated cortical infarct.  No extra-axial fluid collection. No evidence of an intracranial mass. No midline shift. Vascular: No hyperdense vessel.  Atherosclerotic calcifications. Skull: Right frontal cranioplasty. Sinuses/Orbits: No mass or acute finding within the imaged orbits. Chronic left maxillary sinus disease (with associated chronic reactive osteitis), similar to the prior examination of 07/27/2022. IMPRESSION: 1.  No evidence of an acute intracranial abnormality. 2. Redemonstrated chronic cortical/subcortical infarcts within the right frontal and left occipital lobes. 3. Redemonstrated chronic infarct within the left cerebellar hemisphere. 4. Background parenchymal atrophy and chronic small vessel ischemic disease, as described. 5. Chronic left maxillary sinus disease. Electronically Signed   By: Jackey Loge D.O.   On: 09/06/2022 11:26        Scheduled Meds:  vitamin B-12  1,000 mcg Oral Daily   divalproex  250 mg Oral Q12H   donepezil  5 mg Oral Daily    enoxaparin (LOVENOX) injection  40 mg Subcutaneous Q24H   feeding supplement  237 mL Oral BID BM   melatonin  10 mg Oral QHS   OLANZapine  15 mg Oral BID   pravastatin  40 mg Oral QHS   rOPINIRole  0.5 mg Oral QHS   tamsulosin  0.4 mg Oral QPC supper   Continuous Infusions:   LOS: 2 days     Tresa Moore, MD [facility]   If 7PM-7AM, please contact night-coverage  09/08/2022, 10:32 AM

## 2022-09-09 DIAGNOSIS — R400 Somnolence: Secondary | ICD-10-CM | POA: Diagnosis not present

## 2022-09-09 MED ORDER — ZIPRASIDONE MESYLATE 20 MG IM SOLR
10.0000 mg | Freq: Once | INTRAMUSCULAR | Status: AC
  Administered 2022-09-09: 10 mg via INTRAMUSCULAR
  Filled 2022-09-09: qty 20

## 2022-09-09 MED ORDER — ZIPRASIDONE MESYLATE 20 MG IM SOLR
10.0000 mg | Freq: Three times a day (TID) | INTRAMUSCULAR | Status: DC | PRN
  Administered 2022-09-10 – 2022-09-18 (×8): 10 mg via INTRAMUSCULAR
  Filled 2022-09-09 (×10): qty 20

## 2022-09-09 NOTE — Progress Notes (Signed)
PROGRESS NOTE    Terry Sloan  HQP:[id number] DOB: 02-Nov-1940 DOA: 08/08/2022 PCP: Pcp, No    Brief Narrative:  82 year old male is a 82 year old male with history of dementia, behavioral disturbance, hyperlipidemia, BPH, insomnia, depression, anxiety, who presents to emergency department from [facility] rehab dementia department on 08/08/2022 for chief concerns of aggression with aggressive outburst towards staff.   Patient was initially IVC.  Behavioral health was consulted and determined that patient was not a danger to self or others.  Patient was pending placement, however was declined due to as needed Haldol doses were given.   On day of admission, nursing noticed that patient's temperature was low, decrease responsiveness/change in mentation, and hypotension.   Serum sodium on day of admission was 139, potassium 4.0, chloride 109, bicarb 24, BUN of 25, serum creatinine 1.13, eGFR greater than 60, nonfasting blood glucose 92, WBC 5.8, hemoglobin 13.8, platelets of 259.   Blood cultures, are in process.  Procalcitonin was less than 0.10, lactic acid was within normal limits.  Magnesium level was 2.1.  UA was negative for leukocytes and nitrates.   Portable chest x-ray was read as rotated portable exam with lower lung volumes.  CT of the head without contrast: Was read as no evidence of acute intracranial abnormality.  Redemonstrated chronic cortical/subcortical infarcts within the right frontal and left occipital lobes.  Redemonstrated chronic infarct within the left cerebellar hemisphere.   Per EDP patient has not required one-to-one sitter over the last few days.  4/6: Patient admitted to the hospitalist service.  Hypothermia and decreased level of responsiveness of completely resolved  4/8: Patient had some agitation last night.  Required IM Geodon x 1   Assessment & Plan:   Principal Problem:   Altered mental status Active Problems:   Mixed hyperlipidemia   History of TIA  (transient ischemic attack)   Anxiety and depression   BPH (benign prostatic hyperplasia)   OAB (overactive bladder)   Dementia with behavioral disturbance   Hypothermia   Restless leg  Altered mental status With hypotension and hypothermia, now resolved Ammonia normal Plan: Continue bedside sitter Delirium precautions Geodon 10 mg IM as needed TOC working on placement  Restless leg Requip trial 0.5 mg nightly   Hypothermia Resolved   Dementia with behavioral disturbance Resumed home donepezil 5 mg daily, olanzapine 15 mg p.o. twice daily, Depakote 250 mg p.o. twice daily Of note patient has not had behavioral issues for the last 48+ hours Patient's last dose of Haldol was given on 09/04/2022 at 8:51 AM Received IM Geodon 10 mg x 1 on 4/7 Haldol IM/IV as needed has been discontinued    BPH (benign prostatic hyperplasia) Flomax 0.4 mg nightly   Anxiety and depression Ativan 0.5 mg p.o. every 6 hours as needed for anxiety, 3 doses ordered  DVT prophylaxis: Lovenox Code Status: Full Family Communication: None Disposition Plan: Status is: Inpatient Remains inpatient appropriate because: Unsafe discharge plan   Level of care: Telemetry Medical  Consultants:  Psychiatry Procedures:  None  Antimicrobials: None   Subjective: Seen and examined.  Lying in bed.  No visible distress.  Sitter at bedside.  Objective: Vitals:   09/09/22 0459 09/09/22 0800 09/09/22 0815 09/09/22 1000  BP: (!) 95/56 (!) 111/58 106/60 110/62  Pulse: 60 61 (!) 59 62  Resp: 16 17 16 17   Temp: (!) 97.5 F (36.4 C) 97.9 F (36.6 C) 97.9 F (36.6 C)   TempSrc: Oral Axillary    SpO2: 98%  97% 99%   Weight:      Height:        Intake/Output Summary (Last 24 hours) at 09/09/2022 1141 Last data filed at 09/09/2022 8270 Gross per 24 hour  Intake 720 ml  Output 2000 ml  Net -1280 ml   Filed Weights   08/08/22 1205 08/14/22 0038 09/07/22 0057  Weight: 81 kg 78.2 kg 78.2 kg     Examination:  General exam: NAD Respiratory system: Clear to auscultation. Respiratory effort normal. Cardiovascular system: S1-S2, RRR, no murmurs, no pedal edema Gastrointestinal system: Soft, NT/ND, normal bowel sounds Central nervous system: Lethargic.  Oriented x 2.  No focal deficits Extremities: Symmetric 5 x 5 power. Skin: No rashes, lesions or ulcers Psychiatry: Judgement and insight appear impaired. Mood & affect confused.     Data Reviewed: I have personally reviewed following labs and imaging studies  CBC: Recent Labs  Lab 09/06/22 0730 09/07/22 0526  WBC 5.8 5.5  HGB 13.8 11.8*  HCT 41.9 35.6*  MCV 94.4 93.9  PLT 259 234   Basic Metabolic Panel: Recent Labs  Lab 09/06/22 0730 09/07/22 0526  NA 139 139  K 4.0 3.9  CL 109 108  CO2 24 24  GLUCOSE 92 89  BUN 28* 27*  CREATININE 1.13 1.17  CALCIUM 8.6* 8.5*  MG 2.1  --    GFR: Estimated Creatinine Clearance: 49.4 mL/min (by C-G formula based on SCr of 1.17 mg/dL). Liver Function Tests: No results for input(s): "AST", "ALT", "ALKPHOS", "BILITOT", "PROT", "ALBUMIN" in the last 168 hours. No results for input(s): "LIPASE", "AMYLASE" in the last 168 hours. Recent Labs  Lab 09/06/22 1907  AMMONIA 17   Coagulation Profile: No results for input(s): "INR", "PROTIME" in the last 168 hours. Cardiac Enzymes: No results for input(s): "CKTOTAL", "CKMB", "CKMBINDEX", "TROPONINI" in the last 168 hours. BNP (last 3 results) No results for input(s): "PROBNP" in the last 8760 hours. HbA1C: No results for input(s): "HGBA1C" in the last 72 hours. CBG: No results for input(s): "GLUCAP" in the last 168 hours. Lipid Profile: No results for input(s): "CHOL", "HDL", "LDLCALC", "TRIG", "CHOLHDL", "LDLDIRECT" in the last 72 hours. Thyroid Function Tests: No results for input(s): "TSH", "T4TOTAL", "FREET4", "T3FREE", "THYROIDAB" in the last 72 hours.  Anemia Panel: Recent Labs    09/06/22 1907  VITAMINB12 167*    Sepsis Labs: Recent Labs  Lab 09/06/22 0730 09/06/22 1004 09/06/22 1907 09/07/22 0526  PROCALCITON  --  <0.10  --  <0.10  LATICACIDVEN 1.4  --  1.7  --     Recent Results (from the past 240 hour(s))  SARS Coronavirus 2 by RT PCR (hospital order, performed in [facility] hospital lab) *cepheid single result test* Anterior Nasal Swab     Status: None   Collection Time: 09/06/22  7:35 AM   Specimen: Anterior Nasal Swab  Result Value Ref Range Status   SARS Coronavirus 2 by RT PCR NEGATIVE NEGATIVE Final    Comment: (NOTE) SARS-CoV-2 target nucleic acids are NOT DETECTED.  The SARS-CoV-2 RNA is generally detectable in upper and lower respiratory specimens during the acute phase of infection. The lowest concentration of SARS-CoV-2 viral copies this assay can detect is 250 copies / mL. A negative result does not preclude SARS-CoV-2 infection and should not be used as the sole basis for treatment or other patient management decisions.  A negative result may occur with improper specimen collection / handling, submission of specimen other than nasopharyngeal swab, presence of viral mutation(s) within  the areas targeted by this assay, and inadequate number of viral copies (<250 copies / mL). A negative result must be combined with clinical observations, patient history, and epidemiological information.  Fact Sheet for Patients:   RoadLapTop.co.zahttps://www.fda.gov/media/158405/download  Fact Sheet for Healthcare Providers: http://kim-miller.com/https://www.fda.gov/media/158404/download  This test is not yet approved or  cleared by the [place] FDA and has been authorized for detection and/or diagnosis of SARS-CoV-2 by FDA under an Emergency Use Authorization (EUA).  This EUA will remain in effect (meaning this test can be used) for the duration of the COVID-19 declaration under Section 564(b)(1) of the Act, 21 U.S.C. section 360bbb-3(b)(1), unless the authorization is terminated or revoked sooner.  Performed  at [facility], [address] [place], [place] [address]   Blood Culture (routine x 2)     Status: None (Preliminary result)   Collection Time: 09/06/22  7:38 AM   Specimen: BLOOD  Result Value Ref Range Status   Specimen Description BLOOD LEFT ANTECUBITAL  Final   Special Requests   Final    BOTTLES DRAWN AEROBIC AND ANAEROBIC Blood Culture results may not be optimal due to an inadequate volume of blood received in culture bottles   Culture   Final    NO GROWTH 3 DAYS Performed at [facility], [address] [place], [place] [address]    Report Status PENDING  Incomplete  Blood Culture (routine x 2)     Status: None (Preliminary result)   Collection Time: 09/06/22  9:18 AM   Specimen: BLOOD  Result Value Ref Range Status   Specimen [facility]  Final   Special Requests   Final    BOTTLES DRAWN AEROBIC AND ANAEROBIC Blood Culture adequate volume   Culture   Final    NO GROWTH 3 DAYS Performed at [facility], [address] [place], [place] [address]    Report Status PENDING  Incomplete  MRSA Next Gen by PCR, Nasal     Status: None   Collection Time: 09/07/22  6:00 AM   Specimen: Nasal Mucosa; Nasal Swab  Result Value Ref Range Status   MRSA by PCR Next Gen NOT DETECTED NOT DETECTED Final    Comment: (NOTE) The GeneXpert MRSA Assay (FDA approved for NASAL specimens only), is one component of a comprehensive MRSA colonization surveillance program. It is not intended to diagnose MRSA infection nor to guide or monitor treatment for MRSA infections. Test performance is not FDA approved in patients less than 83 years old. Performed at [facility], [address] [place], [place] [address]          Radiology Studies: No results found.      Scheduled Meds:  vitamin B-12  1,000 mcg Oral Daily   divalproex  250 mg Oral Q12H   donepezil  5 mg Oral Daily   enoxaparin (LOVENOX) injection  40 mg Subcutaneous  Q24H   feeding supplement  237 mL Oral BID BM   melatonin  10 mg Oral QHS   OLANZapine  15 mg Oral BID   pravastatin  40 mg Oral QHS   rOPINIRole  0.5 mg Oral QHS   tamsulosin  0.4 mg Oral QPC supper   Continuous Infusions:   LOS: 3 days     Tresa MooreSudheer B Karsyn Jamie, MD [facility]   If 7PM-7AM, please contact night-coverage  09/09/2022, 11:41 AM

## 2022-09-09 NOTE — Progress Notes (Signed)
Physical Therapy Treatment Patient Details Name: Terry Sloan MRN: [id number] DOB: [birth year] Today's Date: 09/09/2022   History of Present Illness 82 year old male is a 82 year old male with history of dementia, behavioral disturbance, hyperlipidemia, BPH, insomnia, depression, anxiety, who presented to emergency department from [facility] rehab dementia department on 08/08/2022 for chief concerns of aggression with aggressive outburst towards staff. On day of admission, nursing noticed that patient's temperature was low, decrease responsiveness/change in mentation, and hypotension.    PT Comments    Pt seen this pm for skilled PT session. Received in bed with B hand mitts on to prevent removal of condom cath. Pt was pleasant and cooperative within cognitive ability. Repeated verbal and visual cues at times to comprehend desired tasks, however able to complete gait training with RW 57ft with CG/MinA for management of device direction and proper hand placement on RW. Mitts were removed during session without incident. Pt assisted back to bed where he became emotional due to missing his dog. Continue to recommend dementia unit upon d/c. Will continue to progress per POC acutely.    Recommendations for follow up therapy are one component of a multi-disciplinary discharge planning process, led by the attending physician.  Recommendations may be updated based on patient status, additional functional criteria and insurance authorization.  Follow Up Recommendations  Can patient physically be transported by private vehicle: Yes    Assistance Recommended at Discharge Frequent or constant Supervision/Assistance  Patient can return home with the following A lot of help with walking and/or transfers;A lot of help with bathing/dressing/bathroom;Assistance with cooking/housework;Help with stairs or ramp for entrance   Equipment Recommendations  Other (comment) (TBD at next facility)    Recommendations for  Other Services       Precautions / Restrictions Precautions Precautions: Fall Restrictions Weight Bearing Restrictions: No     Mobility  Bed Mobility Overal bed mobility: Needs Assistance Bed Mobility: Supine to Sit, Sit to Supine     Supine to sit: Min guard Sit to supine: Min assist, Mod assist   General bed mobility comments:  (Assist for sit to supine due to difficulty processing therapist's cues)    Transfers Overall transfer level: Needs assistance Equipment used: Rolling walker (2 wheels) Transfers: Sit to/from Stand Sit to Stand: Min assist           General transfer comment: slow to initiate upward movement, light assist to help initiate and he was able to maintain upward momentum with tactile cues    Ambulation/Gait Ambulation/Gait assistance: Min assist (assist to maneuver RW in hallway for directional purposes) Gait Distance (Feet):  (75) Assistive device: Rolling walker (2 wheels) Gait Pattern/deviations: Decreased step length - right, Decreased step length - left, Trunk flexed, Drifts right/left, Narrow base of support Gait velocity: decreased     General Gait Details:  (Pt tolerated gait distance well, able to offer his feelings of fatigue and returning to room. No LOB or difficulty redirecting)   Stairs             Wheelchair Mobility    Modified Rankin (Stroke Patients Only)       Balance Overall balance assessment: Needs assistance Sitting-balance support: Feet supported, No upper extremity supported Sitting balance-Leahy Scale: Good     Standing balance support: During functional activity, Bilateral upper extremity supported, Reliant on assistive device for balance Standing balance-Leahy Scale: Poor Standing balance comment:  (Poor/Fair, requires continuous cuing for hand placement on AD)  Cognition Arousal/Alertness: Awake/alert Behavior During Therapy: WFL for tasks assessed/performed  (continuous cues for simple tasks) Overall Cognitive Status: No family/caregiver present to determine baseline cognitive functioning                                 General Comments:  (Pt cooperative and participated well with increased time to complete desired task due to cognitive deficits)        Exercises      General Comments General comments (skin integrity, edema, etc.):  (B hand mits on upon arrival, removed for PT session without incident)      Pertinent Vitals/Pain Pain Assessment Pain Assessment: No/denies pain    Home Living                          Prior Function            PT Goals (current goals can now be found in the care plan section) Acute Rehab PT Goals Patient Stated Goal: none stated, minimal verbaliztion Progress towards PT goals: Progressing toward goals    Frequency    Min 2X/week      PT Plan      Co-evaluation              AM-PAC PT "6 Clicks" Mobility   Outcome Measure  Help needed turning from your back to your side while in a flat bed without using bedrails?: A Little Help needed moving from lying on your back to sitting on the side of a flat bed without using bedrails?: A Little Help needed moving to and from a bed to a chair (including a wheelchair)?: A Lot Help needed standing up from a chair using your arms (e.g., wheelchair or bedside chair)?: A Lot Help needed to walk in hospital room?: A Lot Help needed climbing 3-5 steps with a railing? : A Lot 6 Click Score: 14    End of Session Equipment Utilized During Treatment: Gait belt Activity Tolerance: Patient tolerated treatment well Patient left: in bed;with call bell/phone within reach;with nursing/sitter in room;with bed alarm set Nurse Communication: Mobility status PT Visit Diagnosis: Unsteadiness on feet (R26.81);Muscle weakness (generalized) (M62.81);Other abnormalities of gait and mobility (R26.89)     Time: 5643-3295 PT Time  Calculation (min) (ACUTE ONLY): 18 min  Charges:  $Therapeutic Activity: 8-22 mins                    Zadie Cleverly, PTA  Jannet Askew 09/09/2022, 4:18 PM

## 2022-09-09 NOTE — TOC Progression Note (Signed)
Transition of Care [facility]) - Progression Note    Patient Details  Name: Terry Sloan MRN: [id number] Date of Birth: 1940-11-24  Transition of Care [facility]) CM/SW Contact  Darleene Cleaver, [place] Phone Number: 09/09/2022, 6:04 PM  Clinical Narrative:     CSW to continue to try to find placement for patient.  At this time patient does not have any bed offers due to still being in mittens.  Patient needs to be out of mittens for at least 48 hours before SNFs will review patient.  Expected Discharge Plan:  (TBD) Barriers to Discharge: Continued Medical Work up  Expected Discharge Plan and Services                                               Social Determinants of Health (SDOH) Interventions SDOH Screenings   Food Insecurity: Patient Unable To Answer (09/07/2022)  Housing: Low Risk  (09/07/2022)  Transportation Needs: Patient Unable To Answer (09/07/2022)  Utilities: Not At Risk (07/23/2022)  Alcohol Screen: Low Risk  (07/23/2022)  Depression (PHQ2-9): Low Risk  (07/23/2022)  Recent Concern: Depression (PHQ2-9) - Medium Risk (06/17/2022)  Financial Resource Strain: Low Risk  (07/23/2022)  Physical Activity: Insufficiently Active (07/23/2022)  Social Connections: Socially Isolated (07/23/2022)  Stress: No Stress Concern Present (07/23/2022)  Tobacco Use: Medium Risk (09/06/2022)    Readmission Risk Interventions     No data to display

## 2022-09-09 NOTE — Care Management Important Message (Signed)
Important Message  Patient Details  Name: Terry Sloan MRN: [id number] Date of Birth: 12/20/1940   Medicare Important Message Given:  N/A - LOS <3 / Initial given by admissions     [name] 09/09/2022, 9:59 AM

## 2022-09-10 DIAGNOSIS — R400 Somnolence: Secondary | ICD-10-CM | POA: Diagnosis not present

## 2022-09-10 NOTE — Progress Notes (Signed)
PROGRESS NOTE    Terry Sloan  TGP:[id number] DOB: [birth year] DOA: 08/08/2022 PCP: Pcp, No    Brief Narrative:  82 year old male is a 82 year old male with history of dementia, behavioral disturbance, hyperlipidemia, BPH, insomnia, depression, anxiety, who presents to emergency department from [facility] rehab dementia department on 08/08/2022 for chief concerns of aggression with aggressive outburst towards staff.   Patient was initially IVC.  Behavioral health was consulted and determined that patient was not a danger to self or others.  Patient was pending placement, however was declined due to as needed Haldol doses were given.   On day of admission, nursing noticed that patient's temperature was low, decrease responsiveness/change in mentation, and hypotension.   Serum sodium on day of admission was 139, potassium 4.0, chloride 109, bicarb 24, BUN of 25, serum creatinine 1.13, eGFR greater than 60, nonfasting blood glucose 92, WBC 5.8, hemoglobin 13.8, platelets of 259.   Blood cultures, are in process.  Procalcitonin was less than 0.10, lactic acid was within normal limits.  Magnesium level was 2.1.  UA was negative for leukocytes and nitrates.   Portable chest x-ray was read as rotated portable exam with lower lung volumes.  CT of the head without contrast: Was read as no evidence of acute intracranial abnormality.  Redemonstrated chronic cortical/subcortical infarcts within the right frontal and left occipital lobes.  Redemonstrated chronic infarct within the left cerebellar hemisphere.   Per EDP patient has not required one-to-one sitter over the last few days.  4/6: Patient admitted to the hospitalist service.  Hypothermia and decreased level of responsiveness of completely resolved  4/8: Patient had some agitation last night.  Required IM Geodon x 1 4/9: 2 family members at bedside this morning.  Relayed some of the barriers to discharge and the concern regarding difficulty finding a  disposition plan.   Assessment & Plan:   Principal Problem:   Altered mental status Active Problems:   Mixed hyperlipidemia   History of TIA (transient ischemic attack)   Anxiety and depression   BPH (benign prostatic hyperplasia)   OAB (overactive bladder)   Dementia with behavioral disturbance   Hypothermia   Restless leg  Altered mental status With hypotension and hypothermia, now resolved Ammonia normal Plan: Continue bedside sitter Delirium precautions Geodon 10 mg IM as needed TOC working on placement Unclear disposition plan  Restless leg Requip trial 0.5 mg nightly   Hypothermia Resolved   Dementia with behavioral disturbance Resumed home donepezil 5 mg daily, olanzapine 15 mg p.o. twice daily, Depakote 250 mg p.o. twice daily Of note patient has not had behavioral issues for the last 48+ hours Patient's last dose of Haldol was given on 09/04/2022 at 8:51 AM Received IM Geodon 10 mg x 1 on 4/7 Haldol IM/IV as needed has been discontinued If patient requiring frequent use of atypical antipsychotics consider reengaging psychiatry for official consult.    BPH (benign prostatic hyperplasia) Flomax 0.4 mg nightly   Anxiety and depression Ativan 0.5 mg p.o. every 6 hours as needed for anxiety, 3 doses ordered  DVT prophylaxis: Lovenox Code Status: Full Family Communication: 2 family members at bedside 4/9 Disposition Plan: Status is: Inpatient Remains inpatient appropriate because: Unsafe discharge plan   Level of care: Telemetry Medical  Consultants:  None  Procedures:  None  Antimicrobials: None   Subjective: Seen and examined.  Lying in bed.  Mittens in place.  No visible distress.  Sitter at bedside.  2 family members bedside.  Objective:  Vitals:   09/09/22 1923 09/10/22 0300 09/10/22 0747 09/10/22 0802  BP: 110/65 108/68 (!) 94/59 118/75  Pulse: 75 81 60 78  Resp: 17 18 18    Temp: 98.4 F (36.9 C) 98.5 F (36.9 C) 97.8 F (36.6 C)    TempSrc: Oral Oral Axillary   SpO2: 98% 97% 96%   Weight:      Height:        Intake/Output Summary (Last 24 hours) at 09/10/2022 1319 Last data filed at 09/10/2022 0900 Gross per 24 hour  Intake --  Output 1300 ml  Net -1300 ml   Filed Weights   08/08/22 1205 08/14/22 0038 09/07/22 0057  Weight: 81 kg 78.2 kg 78.2 kg    Examination:  General exam: No acute distress Respiratory system: Clear to auscultation. Respiratory effort normal. Cardiovascular system: S1-S2, RRR, no murmurs, no pedal edema Gastrointestinal system: Soft, NT/ND, normal bowel sounds Central nervous system: Awake, oriented x 2, no focal deficits Extremities: Symmetric 5 x 5 power. Skin: No rashes, lesions or ulcers Psychiatry: Judgement and insight appear impaired. Mood & affect confused.     Data Reviewed: I have personally reviewed following labs and imaging studies  CBC: Recent Labs  Lab 09/06/22 0730 09/07/22 0526  WBC 5.8 5.5  HGB 13.8 11.8*  HCT 41.9 35.6*  MCV 94.4 93.9  PLT 259 234   Basic Metabolic Panel: Recent Labs  Lab 09/06/22 0730 09/07/22 0526  NA 139 139  K 4.0 3.9  CL 109 108  CO2 24 24  GLUCOSE 92 89  BUN 28* 27*  CREATININE 1.13 1.17  CALCIUM 8.6* 8.5*  MG 2.1  --    GFR: Estimated Creatinine Clearance: 49.4 mL/min (by C-G formula based on SCr of 1.17 mg/dL). Liver Function Tests: No results for input(s): "AST", "ALT", "ALKPHOS", "BILITOT", "PROT", "ALBUMIN" in the last 168 hours. No results for input(s): "LIPASE", "AMYLASE" in the last 168 hours. Recent Labs  Lab 09/06/22 1907  AMMONIA 17   Coagulation Profile: No results for input(s): "INR", "PROTIME" in the last 168 hours. Cardiac Enzymes: No results for input(s): "CKTOTAL", "CKMB", "CKMBINDEX", "TROPONINI" in the last 168 hours. BNP (last 3 results) No results for input(s): "PROBNP" in the last 8760 hours. HbA1C: No results for input(s): "HGBA1C" in the last 72 hours. CBG: No results for input(s):  "GLUCAP" in the last 168 hours. Lipid Profile: No results for input(s): "CHOL", "HDL", "LDLCALC", "TRIG", "CHOLHDL", "LDLDIRECT" in the last 72 hours. Thyroid Function Tests: No results for input(s): "TSH", "T4TOTAL", "FREET4", "T3FREE", "THYROIDAB" in the last 72 hours.  Anemia Panel: No results for input(s): "VITAMINB12", "FOLATE", "FERRITIN", "TIBC", "IRON", "RETICCTPCT" in the last 72 hours.  Sepsis Labs: Recent Labs  Lab 09/06/22 0730 09/06/22 1004 09/06/22 1907 09/07/22 0526  PROCALCITON  --  <0.10  --  <0.10  LATICACIDVEN 1.4  --  1.7  --     Recent Results (from the past 240 hour(s))  SARS Coronavirus 2 by RT PCR (hospital order, performed in [facility] hospital lab) *cepheid single result test* Anterior Nasal Swab     Status: None   Collection Time: 09/06/22  7:35 AM   Specimen: Anterior Nasal Swab  Result Value Ref Range Status   SARS Coronavirus 2 by RT PCR NEGATIVE NEGATIVE Final    Comment: (NOTE) SARS-CoV-2 target nucleic acids are NOT DETECTED.  The SARS-CoV-2 RNA is generally detectable in upper and lower respiratory specimens during the acute phase of infection. The lowest concentration of SARS-CoV-2 viral copies  this assay can detect is 250 copies / mL. A negative result does not preclude SARS-CoV-2 infection and should not be used as the sole basis for treatment or other patient management decisions.  A negative result may occur with improper specimen collection / handling, submission of specimen other than nasopharyngeal swab, presence of viral mutation(s) within the areas targeted by this assay, and inadequate number of viral copies (<250 copies / mL). A negative result must be combined with clinical observations, patient history, and epidemiological information.  Fact Sheet for Patients:   RoadLapTop.co.zahttps://www.fda.gov/media/158405/download  Fact Sheet for Healthcare Providers: http://kim-miller.com/https://www.fda.gov/media/158404/download  This test is not yet approved or   cleared by the [place] FDA and has been authorized for detection and/or diagnosis of SARS-CoV-2 by FDA under an Emergency Use Authorization (EUA).  This EUA will remain in effect (meaning this test can be used) for the duration of the COVID-19 declaration under Section 564(b)(1) of the Act, 21 U.S.C. section 360bbb-3(b)(1), unless the authorization is terminated or revoked sooner.  Performed at [facility], [address] [place], [place] [address]   Blood Culture (routine x 2)     Status: None (Preliminary result)   Collection Time: 09/06/22  7:38 AM   Specimen: BLOOD  Result Value Ref Range Status   Specimen Description BLOOD LEFT ANTECUBITAL  Final   Special Requests   Final    BOTTLES DRAWN AEROBIC AND ANAEROBIC Blood Culture results may not be optimal due to an inadequate volume of blood received in culture bottles   Culture   Final    NO GROWTH 4 DAYS Performed at [facility], [address] [place], [place] [address]    Report Status PENDING  Incomplete  Blood Culture (routine x 2)     Status: None (Preliminary result)   Collection Time: 09/06/22  9:18 AM   Specimen: BLOOD  Result Value Ref Range Status   Specimen Description [facility]LOOD RIGHT [facility]  Final   Special Requests   Final    BOTTLES DRAWN AEROBIC AND ANAEROBIC Blood Culture adequate volume   Culture   Final    NO GROWTH 4 DAYS Performed at [facility], [address] [place], [place] [address]    Report Status PENDING  Incomplete  MRSA Next Gen by PCR, Nasal     Status: None   Collection Time: 09/07/22  6:00 AM   Specimen: Nasal Mucosa; Nasal Swab  Result Value Ref Range Status   MRSA by PCR Next Gen NOT DETECTED NOT DETECTED Final    Comment: (NOTE) The GeneXpert MRSA Assay (FDA approved for NASAL specimens only), is one component of a comprehensive MRSA colonization surveillance program. It is not intended to diagnose MRSA infection nor to guide or monitor treatment for  MRSA infections. Test performance is not FDA approved in patients less than 83 years old. Performed at [facility], [address] [place], [place] [address]          Radiology Studies: No results found.      Scheduled Meds:  vitamin B-12  1,000 mcg Oral Daily   divalproex  250 mg Oral Q12H   donepezil  5 mg Oral Daily   enoxaparin (LOVENOX) injection  40 mg Subcutaneous Q24H   feeding supplement  237 mL Oral BID BM   melatonin  10 mg Oral QHS   OLANZapine  15 mg Oral BID   pravastatin  40 mg Oral QHS   rOPINIRole  0.5 mg Oral QHS  tamsulosin  0.4 mg Oral QPC supper   Continuous Infusions:   LOS: 4 days     Tresa Moore, MD [facility]   If 7PM-7AM, please contact night-coverage  09/10/2022, 1:19 PM

## 2022-09-11 ENCOUNTER — Encounter: Payer: Self-pay | Admitting: Internal Medicine

## 2022-09-11 DIAGNOSIS — R4182 Altered mental status, unspecified: Secondary | ICD-10-CM | POA: Diagnosis not present

## 2022-09-11 LAB — CULTURE, BLOOD (ROUTINE X 2)
Culture: NO GROWTH
Culture: NO GROWTH
Special Requests: ADEQUATE

## 2022-09-11 MED ORDER — OXYCODONE HCL 5 MG PO TABS
5.0000 mg | ORAL_TABLET | Freq: Three times a day (TID) | ORAL | Status: DC | PRN
  Administered 2022-09-11 – 2022-09-17 (×9): 5 mg via ORAL
  Filled 2022-09-11 (×9): qty 1

## 2022-09-11 NOTE — Progress Notes (Signed)
       CROSS COVER NOTE  NAME: Terry Sloan MRN: [id number] DOB : Aug 24, 1940    HPI/Events of Note   Report: message received from nurse via chatPt is here for AMS. Is currently trying to climb OOB and pulling at things (Has mitts on and is still managing to get things off.) Haw a 1:1 sitter. He was given IM geodon PRN at approx 1700 with no effect. Was given scheduled night meds around 2030 which included PO zyprexa and melatonin. Geodon cannot be given again until 0100. Can you order something else to help calm him?   On review of chart:patient here with history of dementia, behavioral disturbance, hyperlipidemia, BPH, insomnia, depression, anxiety, who presented to emergency department from [facility] rehab dementia department on 08/08/2022 for chief concerns of aggression with aggressive outburst towards staff.     Assessment and  Interventions   Assessment: At bedside patient restless in the bed. I asked him about pain. He said yes. When asked where he was hurting he said his back. Meds reviewed by nurse and plan to treat his pain with previously ordered pain med oxycodone Plan: Safety sitter renewed/reordered       Donnie Mesa NP [facility]

## 2022-09-11 NOTE — Progress Notes (Addendum)
Progress Note    Terry CornerOtis L Gelin  XBM:[id number] DOB: 1940/07/02  DOA: 08/08/2022 PCP: Pcp, No      Brief Narrative:    Medical records reviewed and are as summarized below:  Terry Sloan is a 82 y.o. male  with history of dementia, behavioral disturbance, hyperlipidemia, BPH, insomnia, depression, anxiety, who [facility]22 for chief concerns of aggression with aggressive outburst towards staff.   Patient was initially IVC.  Behavioral health was consulted and determined that patient was not a danger to self or others.  Patient was pending placement, however was declined due to as needed Haldol doses were given.   On day of admission, nursing noticed that patient's temperature was low, decrease responsiveness/change in mentation, and hypotension.      Assessment/Plan:   Principal Problem:   Altered mental status Active Problems:   Mixed hyperlipidemia   History of TIA (transient ischemic attack)   Anxiety and depression   BPH (benign prostatic hyperplasia)   OAB (overactive bladder)   Dementia with behavioral disturbance   Hypothermia   Restless leg   Body mass index is 24.74 kg/m.    Altered mental status With hypotension and hypothermia, now resolved..  Ammonia level was normal Continue supportive care.    Restless leg Requip trial 0.5 mg nightly   Hypothermia Resolved   Dementia with behavioral disturbance Continue donepezil, Depakote and olanzapine.  Geodon IM as needed for agitation. No acute behavioral issues reported in the last few days. Patient's last dose of Haldol was given on 09/04/2022 at 8:51 AM Haldol IM/IV as needed has been discontinued If patient requiring frequent use of atypical antipsychotics consider reengaging psychiatry for official consult.     BPH (benign prostatic hyperplasia) Flomax 0.4 mg nightly   Anxiety and depression Ativan 0.5 mg p.o. every 6  hours as needed for anxiety, 3 doses ordered    Diet Order             Diet regular Fluid consistency: Thin  Diet effective now                            Consultants: Psychiatrist  Procedures: None    Medications:    vitamin B-12  1,000 mcg Oral Daily   divalproex  250 mg Oral Q12H   donepezil  5 mg Oral Daily   enoxaparin (LOVENOX) injection  40 mg Subcutaneous Q24H   feeding supplement  237 mL Oral BID BM   melatonin  10 mg Oral QHS   OLANZapine  15 mg Oral BID   pravastatin  40 mg Oral QHS   tamsulosin  0.4 mg Oral QPC supper   Continuous Infusions:   Anti-infectives (From admission, onward)    None              Family Communication/Anticipated D/C date and plan/Code Status   DVT prophylaxis: enoxaparin (LOVENOX) injection 40 mg Start: 09/06/22 2200 Place TED hose Start: 09/06/22 1609     Code Status: Full Code  Family Communication: None Disposition Plan: Plan to discharge to SNF   Status is: Inpatient Remains inpatient appropriate because: Awaiting placement to SNF       Subjective:   Interval events noted.  He has no complaints.  Objective:    Vitals:   09/10/22 1946 09/11/22 0342 09/11/22 0807 09/11/22 1159  BP: 115/63 117/78 122/68 (!) 89/56  Pulse:  86 88 77 71  Resp: 18 18 17 17   Temp: 99.1 F (37.3 C) 98.8 F (37.1 C) 97.7 F (36.5 C) 97.9 F (36.6 C)  TempSrc:  Axillary Axillary   SpO2:  99% 100% 98%  Weight:      Height:       No data found.   Intake/Output Summary (Last 24 hours) at 09/11/2022 1525 Last data filed at 09/11/2022 1300 Gross per 24 hour  Intake --  Output 1850 ml  Net -1850 ml   Filed Weights   08/08/22 1205 08/14/22 0038 09/07/22 0057  Weight: 81 kg 78.2 kg 78.2 kg    Exam:  GEN: NAD SKIN: Warm and dry EYES: EOMI ENT: MMM CV: RRR PULM: CTA B ABD: soft, ND, NT, +BS CNS: AAO x 1 (person), non focal EXT: No edema or tenderness        Data Reviewed:   I have  personally reviewed following labs and imaging studies:  Labs: Labs show the following:   Basic Metabolic Panel: Recent Labs  Lab 09/06/22 0730 09/07/22 0526  NA 139 139  K 4.0 3.9  CL 109 108  CO2 24 24  GLUCOSE 92 89  BUN 28* 27*  CREATININE 1.13 1.17  CALCIUM 8.6* 8.5*  MG 2.1  --    GFR Estimated Creatinine Clearance: 49.4 mL/min (by C-G formula based on SCr of 1.17 mg/dL). Liver Function Tests: No results for input(s): "AST", "ALT", "ALKPHOS", "BILITOT", "PROT", "ALBUMIN" in the last 168 hours. No results for input(s): "LIPASE", "AMYLASE" in the last 168 hours. Recent Labs  Lab 09/06/22 1907  AMMONIA 17   Coagulation profile No results for input(s): "INR", "PROTIME" in the last 168 hours.  CBC: Recent Labs  Lab 09/06/22 0730 09/07/22 0526  WBC 5.8 5.5  HGB 13.8 11.8*  HCT 41.9 35.6*  MCV 94.4 93.9  PLT 259 234   Cardiac Enzymes: No results for input(s): "CKTOTAL", "CKMB", "CKMBINDEX", "TROPONINI" in the last 168 hours. BNP (last 3 results) No results for input(s): "PROBNP" in the last 8760 hours. CBG: No results for input(s): "GLUCAP" in the last 168 hours. D-Dimer: No results for input(s): "DDIMER" in the last 72 hours. Hgb A1c: No results for input(s): "HGBA1C" in the last 72 hours. Lipid Profile: No results for input(s): "CHOL", "HDL", "LDLCALC", "TRIG", "CHOLHDL", "LDLDIRECT" in the last 72 hours. Thyroid function studies: No results for input(s): "TSH", "T4TOTAL", "T3FREE", "THYROIDAB" in the last 72 hours.  Invalid input(s): "FREET3" Anemia work up: No results for input(s): "VITAMINB12", "FOLATE", "FERRITIN", "TIBC", "IRON", "RETICCTPCT" in the last 72 hours. Sepsis Labs: Recent Labs  Lab 09/06/22 0730 09/06/22 1004 09/06/22 1907 09/07/22 0526  PROCALCITON  --  <0.10  --  <0.10  WBC 5.8  --   --  5.5  LATICACIDVEN 1.4  --  1.7  --     Microbiology Recent Results (from the past 240 hour(s))  SARS Coronavirus 2 by RT PCR (hospital  order, performed in [facility] hospital lab) *cepheid single result test* Anterior Nasal Swab     Status: None   Collection Time: 09/06/22  7:35 AM   Specimen: Anterior Nasal Swab  Result Value Ref Range Status   SARS Coronavirus 2 by RT PCR NEGATIVE NEGATIVE Final    Comment: (NOTE) SARS-CoV-2 target nucleic acids are NOT DETECTED.  The SARS-CoV-2 RNA is generally detectable in upper and lower respiratory specimens during the acute phase of infection. The lowest concentration of SARS-CoV-2 viral copies this assay can detect  is 250 copies / mL. A negative result does not preclude SARS-CoV-2 infection and should not be used as the sole basis for treatment or other patient management decisions.  A negative result may occur with improper specimen collection / handling, submission of specimen other than nasopharyngeal swab, presence of viral mutation(s) within the areas targeted by this assay, and inadequate number of viral copies (<250 copies / mL). A negative result must be combined with clinical observations, patient history, and epidemiological information.  Fact Sheet for Patients:   RoadLapTop.co.za  Fact Sheet for Healthcare Providers: http://kim-miller.com/  This test is not yet approved or  cleared by the [place] FDA and has been authorized for detection and/or diagnosis of SARS-CoV-2 by FDA under an Emergency Use Authorization (EUA).  This EUA will remain in effect (meaning this test can be used) for the duration of the COVID-19 declaration under Section 564(b)(1) of the Act, 21 U.S.C. section 360bbb-3(b)(1), unless the authorization is terminated or revoked sooner.  Performed at [facility], [address] [place], [place] [address]   Blood Culture (routine x 2)     Status: None   Collection Time: 09/06/22  7:38 AM   Specimen: BLOOD  Result Value Ref Range Status   Specimen Description BLOOD LEFT  ANTECUBITAL  Final   Special Requests   Final    BOTTLES DRAWN AEROBIC AND ANAEROBIC Blood Culture results may not be optimal due to an inadequate volume of blood received in culture bottles   Culture   Final    NO GROWTH 5 DAYS Performed at [facility], [address] [place], [place] [address]    Report Status 09/11/2022 FINAL  Final  Blood Culture (routine x 2)     Status: None   Collection Time: 09/06/22  9:18 AM   Specimen: BLOOD  Result Value Ref Range Status   Specimen Description BLOOD RIGHT [facility]  Final   Special Requests   Final    BOTTLES DRAWN AEROBIC AND ANAEROBIC Blood Culture adequate volume   Culture   Final    NO GROWTH 5 DAYS Performed at [facility], [address], [place], [place] [address]    Report Status 09/11/2022 FINAL  Final  MRSA Next Gen by PCR, Nasal     Status: None   Collection Time: 09/07/22  6:00 AM   Specimen: Nasal Mucosa; Nasal Swab  Result Value Ref Range Status   MRSA by PCR Next Gen NOT DETECTED NOT DETECTED Final    Comment: (NOTE) The GeneXpert MRSA Assay (FDA approved for NASAL specimens only), is one component of a comprehensive MRSA colonization surveillance program. It is not intended to diagnose MRSA infection nor to guide or monitor treatment for MRSA infections. Test performance is not FDA approved in patients less than 59 years old. Performed at [facility], [address] [place], [place] [address]     Procedures and diagnostic studies:  No results found.             LOS: 5 days   Olegario Emberson  [facility]   Pager on [email or web address]. If 7PM-7AM, please contact night-coverage at [email or web address]     09/11/2022, 3:25 PM

## 2022-09-11 NOTE — Plan of Care (Incomplete)
  Problem: Fluid Volume: Goal: Hemodynamic stability will improve Outcome: Progressing   Problem: Clinical Measurements: Goal: Diagnostic test results will improve Outcome: Progressing Goal: Signs and symptoms of infection will decrease Outcome: Progressing   Problem: Respiratory: Goal: Ability to maintain adequate ventilation will improve Outcome: Progressing   Problem: Education: Goal: Knowledge of General Education information will improve Description: Including pain rating scale, medication(s)/side effects and non-pharmacologic comfort measures Outcome: Progressing   Problem: Health Behavior/Discharge Planning: Goal: Ability to manage health-related needs will improve Outcome: Progressing   Problem: Clinical Measurements: Goal: Ability to maintain clinical measurements within normal limits will improve Outcome: Progressing Goal: Will remain free from infection Outcome: Progressing Goal: Diagnostic test results will improve Outcome: Progressing Goal: Respiratory complications will improve Outcome: Progressing Goal: Cardiovascular complication will be avoided Outcome: Progressing   Problem: Activity: Goal: Risk for activity intolerance will decrease Outcome: Progressing   Problem: Nutrition: Goal: Adequate nutrition will be maintained Outcome: Progressing   Problem: Coping: Goal: Level of anxiety will decrease Outcome: Progressing   Problem: Elimination: Goal: Will not experience complications related to bowel motility Outcome: Progressing Goal: Will not experience complications related to urinary retention Outcome: Progressing   Problem: Pain Managment: Goal: General experience of comfort will improve Outcome: Progressing   Problem: Safety: Goal: Ability to remain free from injury will improve Outcome: Progressing   Problem: Skin Integrity: Goal: Risk for impaired skin integrity will decrease Outcome: Progressing   Problem: Education: Goal:  Knowledge of General Education information will improve Description: Including pain rating scale, medication(s)/side effects and non-pharmacologic comfort measures Outcome: Progressing   Problem: Clinical Measurements: Goal: Ability to maintain clinical measurements within normal limits will improve Outcome: Progressing   Problem: Safety: Goal: Ability to remain free from injury will improve Outcome: Progressing   Problem: Skin Integrity: Goal: Risk for impaired skin integrity will decrease Outcome: Progressing

## 2022-09-11 NOTE — Progress Notes (Signed)
Physical Therapy Treatment Patient Details Name: IVEN SOSCIA MRN: [id number] DOB: May 07, 1941 Today's Date: 09/11/2022   History of Present Illness 82 year old male is a 82 year old male with history of dementia, behavioral disturbance, hyperlipidemia, BPH, insomnia, depression, anxiety, who presented to emergency department from [facility] rehab dementia department on 08/08/2022 for chief concerns of aggression with aggressive outburst towards staff. On day of admission, nursing noticed that patient's temperature was low, decrease responsiveness/change in mentation, and hypotension.    PT Comments    Pt very impulsive this pm with difficulty redirecting, however able to perform gait training with RW in hallway with CGA for safety. Pt without hand mitts today, able to complete desired tasks with increased time. No aggressive behavior noted. Will continue to progress per POC.   Recommendations for follow up therapy are one component of a multi-disciplinary discharge planning process, led by the attending physician.  Recommendations may be updated based on patient status, additional functional criteria and insurance authorization.  Follow Up Recommendations  Can patient physically be transported by private vehicle: Yes    Assistance Recommended at Discharge Frequent or constant Supervision/Assistance  Patient can return home with the following A lot of help with walking and/or transfers;A lot of help with bathing/dressing/bathroom;Assistance with cooking/housework;Help with stairs or ramp for entrance   Equipment Recommendations  Other (comment) (TBD at next facility)    Recommendations for Other Services       Precautions / Restrictions Precautions Precautions: Fall Restrictions Weight Bearing Restrictions: No     Mobility  Bed Mobility               General bed mobility comments:  (in chair pre/post session)    Transfers Overall transfer level: Needs assistance Equipment  used: Rolling walker (2 wheels) Transfers: Sit to/from Stand Sit to Stand: Min guard (for safety)           General transfer comment:  (visual cues for comprehension of desired task)    Ambulation/Gait Ambulation/Gait assistance: Min assist (for safety and direction assist for RW) Gait Distance (Feet):  (50) Assistive device: Rolling walker (2 wheels) Gait Pattern/deviations: Decreased step length - right, Decreased step length - left, Trunk flexed, Drifts right/left, Narrow base of support Gait velocity: decreased     General Gait Details:  (Pt very distracted this pm, difficulty staying o task with increased time to redirect back to room)   Stairs             Wheelchair Mobility    Modified Rankin (Stroke Patients Only)       Balance Overall balance assessment: Needs assistance Sitting-balance support: Feet supported, No upper extremity supported Sitting balance-Leahy Scale: Good Sitting balance - Comments: Able to reach for objects on floor without LOB   Standing balance support: During functional activity, Bilateral upper extremity supported, Reliant on assistive device for balance Standing balance-Leahy Scale: Poor Standing balance comment: poor awareness generally as well as with AD use                            Cognition Arousal/Alertness: Awake/alert Behavior During Therapy: Impulsive (multimodal cues needed) Overall Cognitive Status: No family/caregiver present to determine baseline cognitive functioning                                 General Comments: Pt struggled to follow all but the most basic cues, did  not answer questions        Exercises      General Comments        Pertinent Vitals/Pain Pain Assessment Pain Assessment: No/denies pain    Home Living                          Prior Function            PT Goals (current goals can now be found in the care plan section) Acute Rehab PT  Goals Patient Stated Goal: none stated, minimal "clear" verbaliztion Progress towards PT goals: Progressing toward goals    Frequency    Min 2X/week      PT Plan Current plan remains appropriate    Co-evaluation              AM-PAC PT "6 Clicks" Mobility   Outcome Measure  Help needed turning from your back to your side while in a flat bed without using bedrails?: A Little Help needed moving from lying on your back to sitting on the side of a flat bed without using bedrails?: A Little Help needed moving to and from a bed to a chair (including a wheelchair)?: A Lot Help needed standing up from a chair using your arms (e.g., wheelchair or bedside chair)?: A Lot Help needed to walk in hospital room?: A Lot Help needed climbing 3-5 steps with a railing? : A Lot 6 Click Score: 14    End of Session Equipment Utilized During Treatment: Gait belt Activity Tolerance: Patient tolerated treatment well Patient left: in chair;with call bell/phone within reach;with nursing/sitter in room Nurse Communication: Mobility status PT Visit Diagnosis: Unsteadiness on feet (R26.81);Muscle weakness (generalized) (M62.81);Other abnormalities of gait and mobility (R26.89)     Time: 1530-1550 PT Time Calculation (min) (ACUTE ONLY): 20 min  Charges:  $Gait Training: 8-22 mins                    Zadie Cleverly, PTA  Jannet Askew 09/11/2022, 4:36 PM

## 2022-09-12 DIAGNOSIS — F03918 Unspecified dementia, unspecified severity, with other behavioral disturbance: Secondary | ICD-10-CM

## 2022-09-12 DIAGNOSIS — E538 Deficiency of other specified B group vitamins: Secondary | ICD-10-CM | POA: Diagnosis not present

## 2022-09-12 DIAGNOSIS — R410 Disorientation, unspecified: Secondary | ICD-10-CM

## 2022-09-12 MED ORDER — CYANOCOBALAMIN 1000 MCG/ML IJ SOLN
1000.0000 ug | Freq: Every day | INTRAMUSCULAR | Status: AC
  Administered 2022-09-12 – 2022-09-16 (×5): 1000 ug via INTRAMUSCULAR
  Filled 2022-09-12 (×5): qty 1

## 2022-09-12 NOTE — TOC Progression Note (Signed)
Transition of Care [facility]) - Progression Note    Patient Details  Name: Terry Sloan MRN: [id number] Date of Birth: 01-Jan-1941  Transition of [facility]) CM/SW Contact  Darleene Cleaver, [place] Phone Number: 09/11/2022 3:30pm  Clinical Narrative:     TOC spoke to [facility] from [facility] she will review patient again and see if one of her facilities can accept patient.  Patient's family have applied to [facility] DSS for [place].  Expected Discharge Plan:  (TBD) Barriers to Discharge: Continued Medical Work up  Expected Discharge Plan and Services  SNF transition from short term to long term care.                                             Social Determinants of Health (SDOH) Interventions SDOH Screenings   Food Insecurity: Patient Unable To Answer (09/07/2022)  Housing: Low Risk  (09/07/2022)  Transportation Needs: Patient Unable To Answer (09/07/2022)  Utilities: Not At Risk (07/23/2022)  Alcohol Screen: Low Risk  (07/23/2022)  Depression (PHQ2-9): Low Risk  (07/23/2022)  Recent Concern: Depression (PHQ2-9) - Medium Risk (06/17/2022)  Financial Resource Strain: Low Risk  (07/23/2022)  Physical Activity: Insufficiently Active (07/23/2022)  Social Connections: Socially Isolated (07/23/2022)  Stress: No Stress Concern Present (07/23/2022)  Tobacco Use: Medium Risk (09/11/2022)    Readmission Risk Interventions     No data to display

## 2022-09-12 NOTE — Progress Notes (Addendum)
Progress Note    Terry Sloan  TMB:[id number] DOB: [birth year]  DOA: 08/08/2022 PCP: Pcp, No      Brief Narrative:    Medical records reviewed and are as summarized below:  Terry Sloan is a 82 y.o. male  with history of dementia, behavioral disturbance, hyperlipidemia, BPH, insomnia, depression, anxiety, who presents to emergency department from [facility] rehab dementia department on 08/08/2022 for chief concerns of aggression with aggressive outburst towards staff.   Patient was initially IVC.  Behavioral health was consulted and determined that patient was not a danger to self or others.  Patient was pending placement, however was declined due to as needed Haldol doses were given.   On day of admission, nursing noticed that patient's temperature was low, decrease responsiveness/change in mentation, and hypotension.      Assessment/Plan:   Principal Problem:   Altered mental status Active Problems:   Mixed hyperlipidemia   History of TIA (transient ischemic attack)   Anxiety and depression   BPH (benign prostatic hyperplasia)   OAB (overactive bladder)   Dementia with behavioral disturbance   Hypothermia   Restless leg   Vitamin B12 deficiency   Body mass index is 24.74 kg/m.    Altered mental status With hypotension and hypothermia, now resolved..  Ammonia level was normal Continue supportive care.    Restless leg Requip trial 0.5 mg nightly   Hypothermia Resolved   Dementia with behavioral disturbance Continue donepezil, Depakote and olanzapine.  Geodon IM as needed for agitation.  Continue one-to-one sitter. Patient's last dose of Haldol was given on 09/04/2022 at 8:51 AM      BPH (benign prostatic hyperplasia) Flomax 0.4 mg nightly   Anxiety and depression Ativan 0.5 mg p.o. every 6 hours as needed for anxiety, 3 doses ordered   Vitamin B12 deficiency  B12 level was 167 on 09/06/2022.  Substitute vitamin B12 injection for oral vitamin B12  supplement.    Diet Order             Diet regular Fluid consistency: Thin  Diet effective now                            Consultants: Psychiatrist  Procedures: None    Medications:    cyanocobalamin  1,000 mcg Intramuscular Daily   divalproex  250 mg Oral Q12H   donepezil  5 mg Oral Daily   enoxaparin (LOVENOX) injection  40 mg Subcutaneous Q24H   feeding supplement  237 mL Oral BID BM   melatonin  10 mg Oral QHS   OLANZapine  15 mg Oral BID   pravastatin  40 mg Oral QHS   tamsulosin  0.4 mg Oral QPC supper   Continuous Infusions:   Anti-infectives (From admission, onward)    None              Family Communication/Anticipated D/C date and plan/Code Status   DVT prophylaxis: enoxaparin (LOVENOX) injection 40 mg Start: 09/06/22 2200 Place TED hose Start: 09/06/22 1609     Code Status: Full Code  Family Communication: None Disposition Plan: Plan to discharge to SNF   Status is: Inpatient Remains inpatient appropriate because: Awaiting placement to SNF       Subjective:   Interval events noted.  He was agitated and combative overnight.  He has a Comptroller at the bedside.  Objective:    Vitals:   09/11/22 1703 09/11/22 2001 09/12/22 6950 09/12/22  1226  BP: 102/70 122/71 (!) 96/54 (!) 88/68  Pulse: 89 82 63 88  Resp:  20 17   Temp:  98.8 F (37.1 C) 97.8 F (36.6 C)   TempSrc:  Oral    SpO2: 96% 100% 97%   Weight:      Height:       No data found.   Intake/Output Summary (Last 24 hours) at 09/12/2022 1548 Last data filed at 09/12/2022 1200 Gross per 24 hour  Intake --  Output 900 ml  Net -900 ml   Filed Weights   08/08/22 1205 08/14/22 0038 09/07/22 0057  Weight: 81 kg 78.2 kg 78.2 kg    Exam:  GEN: NAD SKIN: Warm and dry EYES: No acute abnormality ENT: MMM CV: RRR PULM: CTA B ABD: soft, ND, NT, +BS CNS: Sleepy but arousable EXT: No edema or tenderness       Data Reviewed:   I have personally  reviewed following labs and imaging studies:  Labs: Labs show the following:   Basic Metabolic Panel: Recent Labs  Lab 09/06/22 0730 09/07/22 0526  NA 139 139  K 4.0 3.9  CL 109 108  CO2 24 24  GLUCOSE 92 89  BUN 28* 27*  CREATININE 1.13 1.17  CALCIUM 8.6* 8.5*  MG 2.1  --    GFR Estimated Creatinine Clearance: 49.4 mL/min (by C-G formula based on SCr of 1.17 mg/dL). Liver Function Tests: No results for input(s): "AST", "ALT", "ALKPHOS", "BILITOT", "PROT", "ALBUMIN" in the last 168 hours. No results for input(s): "LIPASE", "AMYLASE" in the last 168 hours. Recent Labs  Lab 09/06/22 1907  AMMONIA 17   Coagulation profile No results for input(s): "INR", "PROTIME" in the last 168 hours.  CBC: Recent Labs  Lab 09/06/22 0730 09/07/22 0526  WBC 5.8 5.5  HGB 13.8 11.8*  HCT 41.9 35.6*  MCV 94.4 93.9  PLT 259 234   Cardiac Enzymes: No results for input(s): "CKTOTAL", "CKMB", "CKMBINDEX", "TROPONINI" in the last 168 hours. BNP (last 3 results) No results for input(s): "PROBNP" in the last 8760 hours. CBG: No results for input(s): "GLUCAP" in the last 168 hours. D-Dimer: No results for input(s): "DDIMER" in the last 72 hours. Hgb A1c: No results for input(s): "HGBA1C" in the last 72 hours. Lipid Profile: No results for input(s): "CHOL", "HDL", "LDLCALC", "TRIG", "CHOLHDL", "LDLDIRECT" in the last 72 hours. Thyroid function studies: No results for input(s): "TSH", "T4TOTAL", "T3FREE", "THYROIDAB" in the last 72 hours.  Invalid input(s): "FREET3" Anemia work up: No results for input(s): "VITAMINB12", "FOLATE", "FERRITIN", "TIBC", "IRON", "RETICCTPCT" in the last 72 hours. Sepsis Labs: Recent Labs  Lab 09/06/22 0730 09/06/22 1004 09/06/22 1907 09/07/22 0526  PROCALCITON  --  <0.10  --  <0.10  WBC 5.8  --   --  5.5  LATICACIDVEN 1.4  --  1.7  --     Microbiology Recent Results (from the past 240 hour(s))  SARS Coronavirus 2 by RT PCR (hospital order,  performed in [facility] hospital lab) *cepheid single result test* Anterior Nasal Swab     Status: None   Collection Time: 09/06/22  7:35 AM   Specimen: Anterior Nasal Swab  Result Value Ref Range Status   SARS Coronavirus 2 by RT PCR NEGATIVE NEGATIVE Final    Comment: (NOTE) SARS-CoV-2 target nucleic acids are NOT DETECTED.  The SARS-CoV-2 RNA is generally detectable in upper and lower respiratory specimens during the acute phase of infection. The lowest concentration of SARS-CoV-2 viral copies this  assay can detect is 250 copies / mL. A negative result does not preclude SARS-CoV-2 infection and should not be used as the sole basis for treatment or other patient management decisions.  A negative result may occur with improper specimen collection / handling, submission of specimen other than nasopharyngeal swab, presence of viral mutation(s) within the areas targeted by this assay, and inadequate number of viral copies (<250 copies / mL). A negative result must be combined with clinical observations, patient history, and epidemiological information.  Fact Sheet for Patients:   RoadLapTop.co.zahttps://www.fda.gov/media/158405/download  Fact Sheet for Healthcare Providers: http://kim-miller.com/https://www.fda.gov/media/158404/download  This test is not yet approved or  cleared by the [place] FDA and has been authorized for detection and/or diagnosis of SARS-CoV-2 by FDA under an Emergency Use Authorization (EUA).  This EUA will remain in effect (meaning this test can be used) for the duration of the COVID-19 declaration under Section 564(b)(1) of the Act, 21 U.S.C. section 360bbb-3(b)(1), unless the authorization is terminated or revoked sooner.  Performed at [facility], [address] [place], [place] [address]   Blood Culture (routine x 2)     Status: None   Collection Time: 09/06/22  7:38 AM   Specimen: BLOOD  Result Value Ref Range Status   Specimen Description BLOOD LEFT ANTECUBITAL   Final   Special Requests   Final    BOTTLES DRAWN AEROBIC AND ANAEROBIC Blood Culture results may not be optimal due to an inadequate volume of blood received in culture bottles   Culture   Final    NO GROWTH 5 DAYS Performed at [facility], [address] [place], [place] [address]    Report Status 09/11/2022 FINAL  Final  Blood Culture (routine x 2)     Status: None   Collection Time: 09/06/22  9:18 AM   Specimen: BLOOD  Result Value Ref Range Status   Specimen Description BLOOD RIGHT [facility]  Final   Special Requests   Final    BOTTLES DRAWN AEROBIC AND ANAEROBIC Blood Culture adequate volume   Culture   Final    NO GROWTH 5 DAYS Performed at [facility], [address] [place], [place] [address]    Report Status 09/11/2022 FINAL  Final  MRSA Next Gen by PCR, Nasal     Status: None   Collection Time: 09/07/22  6:00 AM   Specimen: Nasal Mucosa; Nasal Swab  Result Value Ref Range Status   MRSA by PCR Next Gen NOT DETECTED NOT DETECTED Final    Comment: (NOTE) The GeneXpert MRSA Assay (FDA approved for NASAL specimens only), is one component of a comprehensive MRSA colonization surveillance program. It is not intended to diagnose MRSA infection nor to guide or monitor treatment for MRSA infections. Test performance is not FDA approved in patients less than 83 years old. Performed at [facility], [address] [place], [place] [address]     Procedures and diagnostic studies:  No results found.             LOS: 6 days   Imari Reen  [facility]   Pager on [email or web address]. If 7PM-7AM, please contact night-coverage at [email or web address]     09/12/2022, 3:48 PM

## 2022-09-13 DIAGNOSIS — F419 Anxiety disorder, unspecified: Secondary | ICD-10-CM

## 2022-09-13 DIAGNOSIS — F32A Depression, unspecified: Secondary | ICD-10-CM | POA: Diagnosis not present

## 2022-09-13 DIAGNOSIS — E538 Deficiency of other specified B group vitamins: Secondary | ICD-10-CM | POA: Diagnosis not present

## 2022-09-13 DIAGNOSIS — F03918 Unspecified dementia, unspecified severity, with other behavioral disturbance: Secondary | ICD-10-CM | POA: Diagnosis not present

## 2022-09-13 DIAGNOSIS — R410 Disorientation, unspecified: Secondary | ICD-10-CM | POA: Diagnosis not present

## 2022-09-13 NOTE — Progress Notes (Signed)
Progress Note    Terry Sloan  RUE:[id number] DOB: Oct 08, 1940  DOA: 08/08/2022 PCP: Pcp, No      Brief Narrative:    Medical records reviewed and are as summarized below:  Terry Sloan is a 82 y.o. male  with history of dementia, behavioral disturbance, hyperlipidemia, BPH, insomnia, depression, anxiety, who presents to emergency department from [facility] rehab dementia department on 08/08/2022 for chief concerns of aggression with aggressive outburst towards staff.   Patient was initially IVC.  Behavioral health was consulted and determined that patient was not a danger to self or others.  Patient was pending placement, however was declined due to as needed Haldol doses were given.   On day of admission, nursing noticed that patient's temperature was low, decrease responsiveness/change in mentation, and hypotension.      Assessment/Plan:   Principal Problem:   Altered mental status Active Problems:   Mixed hyperlipidemia   History of TIA (transient ischemic attack)   Anxiety and depression   BPH (benign prostatic hyperplasia)   OAB (overactive bladder)   Dementia with behavioral disturbance   Hypothermia   Restless leg   Vitamin B12 deficiency   Body mass index is 24.74 kg/m.    Altered mental status With hypotension and hypothermia, now resolved..  Ammonia level was normal Continue supportive care.    Restless leg Requip trial 0.5 mg nightly   Hypothermia Resolved   Dementia with behavioral disturbance Continue donepezil, Depakote and olanzapine.  Geodon IM as needed for agitation.  Continue one-to-one sitter. Patient's last dose of Haldol was given on 09/04/2022 at 8:51 AM      BPH (benign prostatic hyperplasia) Continue Flomax    Anxiety and depression Ativan as needed for anxiety   Vitamin B12 deficiency  B12 level was 167 on 09/06/2022.  Continue daily vitamin B12 injection.  .    Diet Order             Diet regular Fluid consistency:  Thin  Diet effective now                            Consultants: Psychiatrist  Procedures: None    Medications:    cyanocobalamin  1,000 mcg Intramuscular Daily   divalproex  250 mg Oral Q12H   donepezil  5 mg Oral Daily   enoxaparin (LOVENOX) injection  40 mg Subcutaneous Q24H   feeding supplement  237 mL Oral BID BM   melatonin  10 mg Oral QHS   OLANZapine  15 mg Oral BID   pravastatin  40 mg Oral QHS   tamsulosin  0.4 mg Oral QPC supper   Continuous Infusions:   Anti-infectives (From admission, onward)    None              Family Communication/Anticipated D/C date and plan/Code Status   DVT prophylaxis: enoxaparin (LOVENOX) injection 40 mg Start: 09/06/22 2200 Place TED hose Start: 09/06/22 1609     Code Status: Full Code  Family Communication: None Disposition Plan: Plan to discharge to SNF   Status is: Inpatient Remains inpatient appropriate because: Awaiting placement to SNF       Subjective:   Interval events noted.  He has no complaints.  Charline Bills, RN and CNA were at the bedside.  Objective:    Vitals:   09/12/22 1226 09/12/22 2048 09/13/22 0510 09/13/22 0807  BP: (!) 88/68 111/66 97/62 (!) 119/58  Pulse: 88 89  81 64  Resp:  18 18 17   Temp:  97.7 F (36.5 C) 98.4 F (36.9 C) 97.8 F (36.6 C)  TempSrc:      SpO2:  100% 100% 97%  Weight:      Height:       No data found.   Intake/Output Summary (Last 24 hours) at 09/13/2022 1230 Last data filed at 09/13/2022 0515 Gross per 24 hour  Intake --  Output 1200 ml  Net -1200 ml   Filed Weights   08/08/22 1205 08/14/22 0038 09/07/22 0057  Weight: 81 kg 78.2 kg 78.2 kg    Exam:  GEN: NAD SKIN: Warm and dry EYES: No pallor or icterus ENT: MMM CV: RRR PULM: CTA B ABD: soft, ND, NT, +BS CNS: Alert, disoriented, non focal EXT: No edema or tenderness    Data Reviewed:   I have personally reviewed following labs and imaging studies:  Labs: Labs show  the following:   Basic Metabolic Panel: Recent Labs  Lab 09/07/22 0526  NA 139  K 3.9  CL 108  CO2 24  GLUCOSE 89  BUN 27*  CREATININE 1.17  CALCIUM 8.5*   GFR Estimated Creatinine Clearance: 49.4 mL/min (by C-G formula based on SCr of 1.17 mg/dL). Liver Function Tests: No results for input(s): "AST", "ALT", "ALKPHOS", "BILITOT", "PROT", "ALBUMIN" in the last 168 hours. No results for input(s): "LIPASE", "AMYLASE" in the last 168 hours. Recent Labs  Lab 09/06/22 1907  AMMONIA 17   Coagulation profile No results for input(s): "INR", "PROTIME" in the last 168 hours.  CBC: Recent Labs  Lab 09/07/22 0526  WBC 5.5  HGB 11.8*  HCT 35.6*  MCV 93.9  PLT 234   Cardiac Enzymes: No results for input(s): "CKTOTAL", "CKMB", "CKMBINDEX", "TROPONINI" in the last 168 hours. BNP (last 3 results) No results for input(s): "PROBNP" in the last 8760 hours. CBG: No results for input(s): "GLUCAP" in the last 168 hours. D-Dimer: No results for input(s): "DDIMER" in the last 72 hours. Hgb A1c: No results for input(s): "HGBA1C" in the last 72 hours. Lipid Profile: No results for input(s): "CHOL", "HDL", "LDLCALC", "TRIG", "CHOLHDL", "LDLDIRECT" in the last 72 hours. Thyroid function studies: No results for input(s): "TSH", "T4TOTAL", "T3FREE", "THYROIDAB" in the last 72 hours.  Invalid input(s): "FREET3" Anemia work up: No results for input(s): "VITAMINB12", "FOLATE", "FERRITIN", "TIBC", "IRON", "RETICCTPCT" in the last 72 hours. Sepsis Labs: Recent Labs  Lab 09/06/22 1907 09/07/22 0526  PROCALCITON  --  <0.10  WBC  --  5.5  LATICACIDVEN 1.7  --     Microbiology Recent Results (from the past 240 hour(s))  SARS Coronavirus 2 by RT PCR (hospital order, performed in [facility] hospital lab) *cepheid single result test* Anterior Nasal Swab     Status: None   Collection Time: 09/06/22  7:35 AM   Specimen: Anterior Nasal Swab  Result Value Ref Range Status   SARS Coronavirus  2 by RT PCR NEGATIVE NEGATIVE Final    Comment: (NOTE) SARS-CoV-2 target nucleic acids are NOT DETECTED.  The SARS-CoV-2 RNA is generally detectable in upper and lower respiratory specimens during the acute phase of infection. The lowest concentration of SARS-CoV-2 viral copies this assay can detect is 250 copies / mL. A negative result does not preclude SARS-CoV-2 infection and should not be used as the sole basis for treatment or other patient management decisions.  A negative result may occur with improper specimen collection / handling, submission of specimen other than nasopharyngeal  swab, presence of viral mutation(s) within the areas targeted by this assay, and inadequate number of viral copies (<250 copies / mL). A negative result must be combined with clinical observations, patient history, and epidemiological information.  Fact Sheet for Patients:   RoadLapTop.co.za  Fact Sheet for Healthcare Providers: http://kim-miller.com/  This test is not yet approved or  cleared by the [place] FDA and has been authorized for detection and/or diagnosis of SARS-CoV-2 by FDA under an Emergency Use Authorization (EUA).  This EUA will remain in effect (meaning this test can be used) for the duration of the COVID-19 declaration under Section 564(b)(1) of the Act, 21 U.S.C. section 360bbb-3(b)(1), unless the authorization is terminated or revoked sooner.  Performed at [facility], [address] [place], [place] [address]   Blood Culture (routine x 2)     Status: None   Collection Time: 09/06/22  7:38 AM   Specimen: BLOOD  Result Value Ref Range Status   Specimen Description BLOOD LEFT ANTECUBITAL  Final   Special Requests   Final    BOTTLES DRAWN AEROBIC AND ANAEROBIC Blood Culture results may not be optimal due to an inadequate volume of blood received in culture bottles   Culture   Final    NO GROWTH 5 DAYS Performed  at [facility], [address], [place], [place] [address]    Report Status 09/11/2022 FINAL  Final  Blood Culture (routine x 2)     Status: None   Collection Time: 09/06/22  9:18 AM   Specimen: BLOOD  Result Value Ref Range Status   Specimen Description BLOOD RIGHT [facility]  Final   Special Requests   Final    BOTTLES DRAWN AEROBIC AND ANAEROBIC Blood Culture adequate volume   Culture   Final    NO GROWTH 5 DAYS Performed at [facility], [address] [place], [place] [address]    Report Status 09/11/2022 FINAL  Final  MRSA Next Gen by PCR, Nasal     Status: None   Collection Time: 09/07/22  6:00 AM   Specimen: Nasal Mucosa; Nasal Swab  Result Value Ref Range Status   MRSA by PCR Next Gen NOT DETECTED NOT DETECTED Final    Comment: (NOTE) The GeneXpert MRSA Assay (FDA approved for NASAL specimens only), is one component of a comprehensive MRSA colonization surveillance program. It is not intended to diagnose MRSA infection nor to guide or monitor treatment for MRSA infections. Test performance is not FDA approved in patients less than 26 years old. Performed at [facility], [address] [place], [place] [address]     Procedures and diagnostic studies:  No results found.             LOS: 7 days   Tynell Winchell  [facility]   Pager on [email or web address]. If 7PM-7AM, please contact night-coverage at [email or web address]     09/13/2022, 12:30 PM

## 2022-09-13 NOTE — Progress Notes (Signed)
Physical Therapy Treatment Patient Details Name: Terry Sloan MRN: [id number] DOB: 10/08/1940 Today's Date: 09/13/2022   History of Present Illness 82 year old male is a 82 year old male with history of dementia, behavioral disturbance, hyperlipidemia, BPH, insomnia, depression, anxiety, who presented to emergency department from [facility] rehab dementia department on 08/08/2022 for chief concerns of aggression with aggressive outburst towards staff. On day of admission, nursing noticed that patient's temperature was low, decrease responsiveness/change in mentation, and hypotension.    PT Comments    Patient lethargic initially with increased alertness with mobility efforts. Patient still continues to require physical assistance with bed mobility and to stand. Poor standing balance with posterior lean. Unable to progress walking safely at this time. Recommend to continue PT to maximize independence and decrease caregiver burden.    Recommendations for follow up therapy are one component of a multi-disciplinary discharge planning process, led by the attending physician.  Recommendations may be updated based on patient status, additional functional criteria and insurance authorization.  Follow Up Recommendations  Can patient physically be transported by private vehicle: Yes    Assistance Recommended at Discharge Frequent or constant Supervision/Assistance  Patient can return home with the following A lot of help with walking and/or transfers;A lot of help with bathing/dressing/bathroom;Assistance with cooking/housework;Help with stairs or ramp for entrance   Equipment Recommendations   (to be determined at next level of care)    Recommendations for Other Services       Precautions / Restrictions Precautions Precautions: Fall Restrictions Weight Bearing Restrictions: No     Mobility  Bed Mobility Overal bed mobility: Needs Assistance Bed Mobility: Supine to Sit, Sit to Supine      Supine to sit: Max assist Sit to supine: Mod assist   General bed mobility comments: assistance for trunk and BLE support. multi-modal cues provided    Transfers Overall transfer level: Needs assistance Equipment used: 1 person hand held assist Transfers: Sit to/from Stand Sit to Stand: Max assist           General transfer comment: posterior bias with standing. faciliation for lifting required    Ambulation/Gait               General Gait Details: unable to safely due to poor standing balance   Stairs             Wheelchair Mobility    Modified Rankin (Stroke Patients Only)       Balance Overall balance assessment: Needs assistance Sitting-balance support: Feet supported     Postural control: Posterior lean Standing balance support: During functional activity Standing balance-Leahy Scale: Poor Standing balance comment: significant assistance reuqired to maintain standing balance with posterior lean. cues for safety                            Cognition Arousal/Alertness: Lethargic Behavior During Therapy: Flat affect Overall Cognitive Status: No family/caregiver present to determine baseline cognitive functioning                                 General Comments: patient is able to follow single step commands some of the time. multi-modal cues required. patient lethargic initially with increased alertness with mobility        Exercises      General Comments        Pertinent Vitals/Pain Pain Assessment Pain Assessment: No/denies pain  Home Living                          Prior Function            PT Goals (current goals can now be found in the care plan section) Acute Rehab PT Goals Patient Stated Goal: none stated PT Goal Formulation: With patient Time For Goal Achievement: 09/20/22 Potential to Achieve Goals: Fair Progress towards PT goals: Progressing toward goals    Frequency     Min 2X/week      PT Plan Current plan remains appropriate    Co-evaluation              AM-PAC PT "6 Clicks" Mobility   Outcome Measure  Help needed turning from your back to your side while in a flat bed without using bedrails?: A Little Help needed moving from lying on your back to sitting on the side of a flat bed without using bedrails?: A Little Help needed moving to and from a bed to a chair (including a wheelchair)?: A Lot Help needed standing up from a chair using your arms (e.g., wheelchair or bedside chair)?: A Lot Help needed to walk in hospital room?: A Lot Help needed climbing 3-5 steps with a railing? : A Lot 6 Click Score: 14    End of Session   Activity Tolerance: Patient tolerated treatment well;Patient limited by fatigue Patient left: in bed;with call bell/phone within reach;with nursing/sitter in room Nurse Communication: Mobility status (1:1 sitter in the room throughout session) PT Visit Diagnosis: Unsteadiness on feet (R26.81);Muscle weakness (generalized) (M62.81);Other abnormalities of gait and mobility (R26.89)     Time: 7353-2992 PT Time Calculation (min) (ACUTE ONLY): 14 min  Charges:  $Therapeutic Activity: 8-22 mins                     Donna Bernard, PT, MPT   Ina Homes 09/13/2022, 1:19 PM

## 2022-09-14 DIAGNOSIS — F32A Depression, unspecified: Secondary | ICD-10-CM | POA: Diagnosis not present

## 2022-09-14 DIAGNOSIS — R410 Disorientation, unspecified: Secondary | ICD-10-CM | POA: Diagnosis not present

## 2022-09-14 DIAGNOSIS — F419 Anxiety disorder, unspecified: Secondary | ICD-10-CM | POA: Diagnosis not present

## 2022-09-14 DIAGNOSIS — F03918 Unspecified dementia, unspecified severity, with other behavioral disturbance: Secondary | ICD-10-CM | POA: Diagnosis not present

## 2022-09-14 DIAGNOSIS — E538 Deficiency of other specified B group vitamins: Secondary | ICD-10-CM | POA: Diagnosis not present

## 2022-09-14 MED ORDER — STERILE WATER FOR INJECTION IJ SOLN
INTRAMUSCULAR | Status: AC
  Filled 2022-09-14: qty 10

## 2022-09-14 NOTE — Progress Notes (Signed)
Progress Note    Terry Sloan  UJW:[id number] DOB: [birth year]  DOA: 08/08/2022 PCP: Pcp, No      Brief Narrative:    Medical records reviewed and are as summarized below:  Terry Sloan is a 82 y.o. male  with history of dementia, behavioral disturbance, hyperlipidemia, BPH, insomnia, depression, anxiety, who presents to emergency department from [facility] rehab dementia department on 08/08/2022 for chief concerns of aggression with aggressive outburst towards staff.   Patient was initially IVC.  Behavioral health was consulted and determined that patient was not a danger to self or others.  Patient was pending placement, however was declined due to as needed Haldol doses were given.   On day of admission, nursing noticed that patient's temperature was low, decrease responsiveness/change in mentation, and hypotension.      Assessment/Plan:   Principal Problem:   Altered mental status Active Problems:   Mixed hyperlipidemia   History of TIA (transient ischemic attack)   Anxiety and depression   BPH (benign prostatic hyperplasia)   OAB (overactive bladder)   Dementia with behavioral disturbance   Hypothermia   Restless leg   Vitamin B12 deficiency   Body mass index is 24.74 kg/m.    Altered mental status With hypotension and hypothermia, now resolved..  Ammonia level was normal Continue supportive care.    Restless leg Requip trial 0.5 mg nightly   Hypothermia Resolved   Dementia with behavioral disturbance He still has intermittent agitation. Continue donepezil, Depakote and olanzapine.  Geodon IM as needed for agitation.  Continue one-to-one sitter. Patient's last dose of Haldol was given on 09/04/2022 at 8:51 AM      BPH (benign prostatic hyperplasia) Continue Flomax    Anxiety and depression Ativan as needed for anxiety   Vitamin B12 deficiency  B12 level was 167 on 09/06/2022.  Continue vitamin B12 injection.    Diet Order              Diet regular Fluid consistency: Thin  Diet effective now                            Consultants: Psychiatrist  Procedures: None    Medications:    cyanocobalamin  1,000 mcg Intramuscular Daily   divalproex  250 mg Oral Q12H   donepezil  5 mg Oral Daily   enoxaparin (LOVENOX) injection  40 mg Subcutaneous Q24H   feeding supplement  237 mL Oral BID BM   melatonin  10 mg Oral QHS   OLANZapine  15 mg Oral BID   pravastatin  40 mg Oral QHS   tamsulosin  0.4 mg Oral QPC supper   Continuous Infusions:   Anti-infectives (From admission, onward)    None              Family Communication/Anticipated D/C date and plan/Code Status   DVT prophylaxis: enoxaparin (LOVENOX) injection 40 mg Start: 09/06/22 2200 Place TED hose Start: 09/06/22 1609     Code Status: Full Code  Family Communication: None Disposition Plan: Plan to discharge to SNF   Status is: Inpatient Remains inpatient appropriate because: Awaiting placement to SNF       Subjective:   He is confused and sleepy, and cannot provide any history.  He was given Geodon earlier this morning for agitation.  Sitter was at the bedside.  Objective:    Vitals:   09/13/22 0807 09/13/22 1700 09/13/22 1958 09/14/22 0804  BP: (!) 119/58 (!) 100/58 118/77 108/62  Pulse: 64 (!) 57 90 70  Resp: Temp: 97.8 F (36.6 C) 98.1 F (36.7 C) 98.4 F (36.9 C) 98 F (36.7 C)  TempSrc:    Axillary  SpO2: 97% 97% 98% 98%  Weight:      Height:       No data found.   Intake/Output Summary (Last 24 hours) at 09/14/2022 1102 Last data filed at 09/14/2022 0730 Gross per 24 hour  Intake --  Output 950 ml  Net -950 ml   Filed Weights   08/08/22 1205 08/14/22 0038 09/07/22 0057  Weight: 81 kg 78.2 kg 78.2 kg    Exam:  GEN: NAD SKIN: Warm and dry EYES: No acute abnormality ENT: MMM CV: RRR PULM: CTA B ABD: soft, nondistended CNS: Sleepy EXT: No edema or tenderness    Data  Reviewed:   I have personally reviewed following labs and imaging studies:  Labs: Labs show the following:   Basic Metabolic Panel: No results for input(s): "NA", "K", "CL", "CO2", "GLUCOSE", "BUN", "CREATININE", "CALCIUM", "MG", "PHOS" in the last 168 hours.  GFR Estimated Creatinine Clearance: 49.4 mL/min (by C-G formula based on SCr of 1.17 mg/dL). Liver Function Tests: No results for input(s): "AST", "ALT", "ALKPHOS", "BILITOT", "PROT", "ALBUMIN" in the last 168 hours. No results for input(s): "LIPASE", "AMYLASE" in the last 168 hours. No results for input(s): "AMMONIA" in the last 168 hours.  Coagulation profile No results for input(s): "INR", "PROTIME" in the last 168 hours.  CBC: No results for input(s): "WBC", "NEUTROABS", "HGB", "HCT", "MCV", "PLT" in the last 168 hours.  Cardiac Enzymes: No results for input(s): "CKTOTAL", "CKMB", "CKMBINDEX", "TROPONINI" in the last 168 hours. BNP (last 3 results) No results for input(s): "PROBNP" in the last 8760 hours. CBG: No results for input(s): "GLUCAP" in the last 168 hours. D-Dimer: No results for input(s): "DDIMER" in the last 72 hours. Hgb A1c: No results for input(s): "HGBA1C" in the last 72 hours. Lipid Profile: No results for input(s): "CHOL", "HDL", "LDLCALC", "TRIG", "CHOLHDL", "LDLDIRECT" in the last 72 hours. Thyroid function studies: No results for input(s): "TSH", "T4TOTAL", "T3FREE", "THYROIDAB" in the last 72 hours.  Invalid input(s): "FREET3" Anemia work up: No results for input(s): "VITAMINB12", "FOLATE", "FERRITIN", "TIBC", "IRON", "RETICCTPCT" in the last 72 hours. Sepsis Labs: No results for input(s): "PROCALCITON", "WBC", "LATICACIDVEN" in the last 168 hours.   Microbiology Recent Results (from the past 240 hour(s))  SARS Coronavirus 2 by RT PCR (hospital order, performed in [facility] hospital lab) *cepheid single result test* Anterior Nasal Swab     Status: None   Collection Time: 09/06/22  7:35  AM   Specimen: Anterior Nasal Swab  Result Value Ref Range Status   SARS Coronavirus 2 by RT PCR NEGATIVE NEGATIVE Final    Comment: (NOTE) SARS-CoV-2 target nucleic acids are NOT DETECTED.  The SARS-CoV-2 RNA is generally detectable in upper and lower respiratory specimens during the acute phase of infection. The lowest concentration of SARS-CoV-2 viral copies this assay can detect is 250 copies / mL. A negative result does not preclude SARS-CoV-2 infection and should not be used as the sole basis for treatment or other patient management decisions.  A negative result may occur with improper specimen collection / handling, submission of specimen other than nasopharyngeal swab, presence of viral mutation(s) within the areas targeted by this assay, and inadequate number of viral copies (<250 copies / mL). A negative result  must be combined with clinical observations, patient history, and epidemiological information.  Fact Sheet for Patients:   RoadLapTop.co.za  Fact Sheet for Healthcare Providers: http://kim-miller.com/  This test is not yet approved or  cleared by the [place] FDA and has been authorized for detection and/or diagnosis of SARS-CoV-2 by FDA under an Emergency Use Authorization (EUA).  This EUA will remain in effect (meaning this test can be used) for the duration of the COVID-19 declaration under Section 564(b)(1) of the Act, 21 U.S.C. section 360bbb-3(b)(1), unless the authorization is terminated or revoked sooner.  Performed at [facility], [address] [place], [place] [address]   Blood Culture (routine x 2)     Status: None   Collection Time: 09/06/22  7:38 AM   Specimen: BLOOD  Result Value Ref Range Status   Specimen Description BLOOD LEFT ANTECUBITAL  Final   Special Requests   Final    BOTTLES DRAWN AEROBIC AND ANAEROBIC Blood Culture results may not be optimal due to an inadequate volume  of blood received in culture bottles   Culture   Final    NO GROWTH 5 DAYS Performed at [facility], [address] [place], [place] [address]    Report Status 09/11/2022 FINAL  Final  Blood Culture (routine x 2)     Status: None   Collection Time: 09/06/22  9:18 AM   Specimen: BLOOD  Result Value Ref Range Status   Specimen Description BLOOD RIGHT [facility]  Final   Special Requests   Final    BOTTLES DRAWN AEROBIC AND ANAEROBIC Blood Culture adequate volume   Culture   Final    NO GROWTH 5 DAYS Performed at [facility], [address], [place], [place] [address]    Report Status 09/11/2022 FINAL  Final  MRSA Next Gen by PCR, Nasal     Status: None   Collection Time: 09/07/22  6:00 AM   Specimen: Nasal Mucosa; Nasal Swab  Result Value Ref Range Status   MRSA by PCR Next Gen NOT DETECTED NOT DETECTED Final    Comment: (NOTE) The GeneXpert MRSA Assay (FDA approved for NASAL specimens only), is one component of a comprehensive MRSA colonization surveillance program. It is not intended to diagnose MRSA infection nor to guide or monitor treatment for MRSA infections. Test performance is not FDA approved in patients less than 64 years old. Performed at [facility], [address] [place], [place] [address]     Procedures and diagnostic studies:  No results found.             LOS: 8 days   Yomara Toothman  [facility]   Pager on [email or web address]. If 7PM-7AM, please contact night-coverage at [email or web address]     09/14/2022, 11:02 AM

## 2022-09-15 DIAGNOSIS — F03918 Unspecified dementia, unspecified severity, with other behavioral disturbance: Secondary | ICD-10-CM | POA: Diagnosis not present

## 2022-09-15 DIAGNOSIS — F419 Anxiety disorder, unspecified: Secondary | ICD-10-CM | POA: Diagnosis not present

## 2022-09-15 DIAGNOSIS — F32A Depression, unspecified: Secondary | ICD-10-CM | POA: Diagnosis not present

## 2022-09-15 DIAGNOSIS — E538 Deficiency of other specified B group vitamins: Secondary | ICD-10-CM | POA: Diagnosis not present

## 2022-09-15 DIAGNOSIS — R410 Disorientation, unspecified: Secondary | ICD-10-CM | POA: Diagnosis not present

## 2022-09-15 LAB — CBC WITH DIFFERENTIAL/PLATELET
Abs Immature Granulocytes: 0.01 10*3/uL (ref 0.00–0.07)
Basophils Absolute: 0 10*3/uL (ref 0.0–0.1)
Basophils Relative: 0 %
Eosinophils Absolute: 0.1 10*3/uL (ref 0.0–0.5)
Eosinophils Relative: 2 %
HCT: 34.6 % — ABNORMAL LOW (ref 39.0–52.0)
Hemoglobin: 11.1 g/dL — ABNORMAL LOW (ref 13.0–17.0)
Immature Granulocytes: 0 %
Lymphocytes Relative: 16 %
Lymphs Abs: 1 10*3/uL (ref 0.7–4.0)
MCH: 30.5 pg (ref 26.0–34.0)
MCHC: 32.1 g/dL (ref 30.0–36.0)
MCV: 95.1 fL (ref 80.0–100.0)
Monocytes Absolute: 1 10*3/uL (ref 0.1–1.0)
Monocytes Relative: 15 %
Neutro Abs: 4.2 10*3/uL (ref 1.7–7.7)
Neutrophils Relative %: 67 %
Platelets: 237 10*3/uL (ref 150–400)
RBC: 3.64 MIL/uL — ABNORMAL LOW (ref 4.22–5.81)
RDW: 12.1 % (ref 11.5–15.5)
WBC: 6.3 10*3/uL (ref 4.0–10.5)
nRBC: 0 % (ref 0.0–0.2)

## 2022-09-15 LAB — BASIC METABOLIC PANEL
Anion gap: 6 (ref 5–15)
BUN: 30 mg/dL — ABNORMAL HIGH (ref 8–23)
CO2: 26 mmol/L (ref 22–32)
Calcium: 8.5 mg/dL — ABNORMAL LOW (ref 8.9–10.3)
Chloride: 107 mmol/L (ref 98–111)
Creatinine, Ser: 1.09 mg/dL (ref 0.61–1.24)
GFR, Estimated: >60 mL/min (ref >60)
Glucose, Bld: 91 mg/dL (ref 70–99)
Potassium: 3.8 mmol/L (ref 3.5–5.1)
Sodium: 139 mmol/L (ref 135–145)

## 2022-09-15 LAB — PHOSPHORUS: Phosphorus: 4.1 mg/dL (ref 2.5–4.6)

## 2022-09-15 LAB — MAGNESIUM: Magnesium: 2.1 mg/dL (ref 1.7–2.4)

## 2022-09-15 MED ORDER — LORAZEPAM 2 MG/ML IJ SOLN
1.0000 mg | Freq: Once | INTRAMUSCULAR | Status: AC
  Administered 2022-09-15: 1 mg via INTRAMUSCULAR

## 2022-09-15 MED ORDER — LORAZEPAM 2 MG/ML IJ SOLN
1.0000 mg | Freq: Once | INTRAMUSCULAR | Status: DC
  Filled 2022-09-15: qty 1

## 2022-09-15 NOTE — Progress Notes (Signed)
Mobility Specialist - Progress Note   09/15/22 1439  Mobility  Activity Ambulated with assistance in room;Stood at bedside;Dangled on edge of bed  Level of Assistance +2 (takes two people)  Press photographer wheel walker  Distance Ambulated (ft) 10 ft  Activity Response Tolerated well  Mobility Referral Yes  $Mobility charge 1 Mobility   Pt supine asleep in bed on arrival. Pt awakens to light touch. Pt completes bed mobility HHA with extra time. Pt ambulates to door CGA + 2 for chair follow and safety. Pt impulsive and displays bad safety awareness throughout, needs constant VC for direction and to stay on task. Pt left in recliner with needs in reach, chair alarm set, and sitter present.  Terrilyn Saver  Mobility Specialist  09/15/22 2:43 PM

## 2022-09-15 NOTE — Progress Notes (Signed)
Progress Note    Terry Sloan  VOP:[id number] DOB: Mar 04, 1941  DOA: 08/08/2022 PCP: Pcp, No      Brief Narrative:    Medical records reviewed and are as summarized below:  Terry Sloan is a 82 y.o. male  with history of dementia, behavioral disturbance, hyperlipidemia, BPH, insomnia, depression, anxiety, who presents to emergency department from [facility] rehab dementia department on 08/08/2022 for chief concerns of aggression with aggressive outburst towards staff.   Patient was initially IVC.  Behavioral health was consulted and determined that patient was not a danger to self or others.  Patient was pending placement, however was declined due to as needed Haldol doses were given.   On day of admission, nursing noticed that patient's temperature was low, decrease responsiveness/change in mentation, and hypotension.      Assessment/Plan:   Principal Problem:   Altered mental status Active Problems:   Mixed hyperlipidemia   History of TIA (transient ischemic attack)   Anxiety and depression   BPH (benign prostatic hyperplasia)   OAB (overactive bladder)   Dementia with behavioral disturbance   Hypothermia   Restless leg   Vitamin B12 deficiency   Body mass index is 24.74 kg/m.    Altered mental status With hypotension and hypothermia, now resolved..  Ammonia level was normal Continue supportive care.    Restless leg Requip trial 0.5 mg nightly   Hypothermia Resolved   Dementia with behavioral disturbance He still has intermittent agitation. Continue Depakote, olanzapine and donepezil.  Continue Geodon IM as needed for agitation.  Patient's last dose of Haldol was given on 09/04/2022 at 8:51 AM      BPH (benign prostatic hyperplasia) Continue Flomax    Anxiety and depression Ativan as needed for anxiety   Vitamin B12 deficiency  B12 level was 167 on 09/06/2022.  Send vitamin B12 injection    Diet Order             Diet regular Fluid  consistency: Thin  Diet effective now                            Consultants: Psychiatrist  Procedures: None    Medications:    cyanocobalamin  1,000 mcg Intramuscular Daily   divalproex  250 mg Oral Q12H   donepezil  5 mg Oral Daily   enoxaparin (LOVENOX) injection  40 mg Subcutaneous Q24H   feeding supplement  237 mL Oral BID BM   melatonin  10 mg Oral QHS   OLANZapine  15 mg Oral BID   pravastatin  40 mg Oral QHS   tamsulosin  0.4 mg Oral QPC supper   Continuous Infusions:   Anti-infectives (From admission, onward)    None              Family Communication/Anticipated D/C date and plan/Code Status   DVT prophylaxis: enoxaparin (LOVENOX) injection 40 mg Start: 09/06/22 2200 Place TED hose Start: 09/06/22 1609     Code Status: Full Code  Family Communication: None Disposition Plan: Plan to discharge to SNF   Status is: Inpatient Remains inpatient appropriate because: Awaiting placement to SNF       Subjective:   Interval events noted.  He cannot provide any history because of confusion.  He has a Comptroller at the bedside.  Objective:    Vitals:   09/14/22 1640 09/14/22 2037 09/15/22 0632 09/15/22 1411  BP: 136/74 105/78 (!) 97/50 (!) 92/59  Pulse: 85 73 60 98  Resp: Temp: (!) 97.4 F (36.3 C) 98.9 F (37.2 C) 98.1 F (36.7 C) 98 F (36.7 C)  TempSrc: Oral Oral    SpO2: 98%  95% (!) 85%  Weight:      Height:       No data found.   Intake/Output Summary (Last 24 hours) at 09/15/2022 1625 Last data filed at 09/15/2022 1502 Gross per 24 hour  Intake 1095 ml  Output 1600 ml  Net -505 ml   Filed Weights   08/08/22 1205 08/14/22 0038 09/07/22 0057  Weight: 81 kg 78.2 kg 78.2 kg    Exam:  GEN: NAD, sitting up in chair SKIN: No rash EYES: EOMI ENT: MMM CV: RRR PULM: CTA B ABD: soft, ND, NT, +BS CNS: Alert but disoriented, non focal EXT: No edema or tenderness    Data Reviewed:   I have  personally reviewed following labs and imaging studies:  Labs: Labs show the following:   Basic Metabolic Panel: Recent Labs  Lab 09/15/22 0440  NA 139  K 3.8  CL 107  CO2 26  GLUCOSE 91  BUN 30*  CREATININE 1.09  CALCIUM 8.5*  MG 2.1  PHOS 4.1    GFR Estimated Creatinine Clearance: 53 mL/min (by C-G formula based on SCr of 1.09 mg/dL). Liver Function Tests: No results for input(s): "AST", "ALT", "ALKPHOS", "BILITOT", "PROT", "ALBUMIN" in the last 168 hours. No results for input(s): "LIPASE", "AMYLASE" in the last 168 hours. No results for input(s): "AMMONIA" in the last 168 hours.  Coagulation profile No results for input(s): "INR", "PROTIME" in the last 168 hours.  CBC: Recent Labs  Lab 09/15/22 0440  WBC 6.3  NEUTROABS 4.2  HGB 11.1*  HCT 34.6*  MCV 95.1  PLT 237    Cardiac Enzymes: No results for input(s): "CKTOTAL", "CKMB", "CKMBINDEX", "TROPONINI" in the last 168 hours. BNP (last 3 results) No results for input(s): "PROBNP" in the last 8760 hours. CBG: No results for input(s): "GLUCAP" in the last 168 hours. D-Dimer: No results for input(s): "DDIMER" in the last 72 hours. Hgb A1c: No results for input(s): "HGBA1C" in the last 72 hours. Lipid Profile: No results for input(s): "CHOL", "HDL", "LDLCALC", "TRIG", "CHOLHDL", "LDLDIRECT" in the last 72 hours. Thyroid function studies: No results for input(s): "TSH", "T4TOTAL", "T3FREE", "THYROIDAB" in the last 72 hours.  Invalid input(s): "FREET3" Anemia work up: No results for input(s): "VITAMINB12", "FOLATE", "FERRITIN", "TIBC", "IRON", "RETICCTPCT" in the last 72 hours. Sepsis Labs: Recent Labs  Lab 09/15/22 0440  WBC 6.3     Microbiology Recent Results (from the past 240 hour(s))  SARS Coronavirus 2 by RT PCR (hospital order, performed in [facility] hospital lab) *cepheid single result test* Anterior Nasal Swab     Status: None   Collection Time: 09/06/22  7:35 AM   Specimen: Anterior Nasal  Swab  Result Value Ref Range Status   SARS Coronavirus 2 by RT PCR NEGATIVE NEGATIVE Final    Comment: (NOTE) SARS-CoV-2 target nucleic acids are NOT DETECTED.  The SARS-CoV-2 RNA is generally detectable in upper and lower respiratory specimens during the acute phase of infection. The lowest concentration of SARS-CoV-2 viral copies this assay can detect is 250 copies / mL. A negative result does not preclude SARS-CoV-2 infection and should not be used as the sole basis for treatment or other patient management decisions.  A negative result may occur with improper specimen collection / handling, submission  of specimen other than nasopharyngeal swab, presence of viral mutation(s) within the areas targeted by this assay, and inadequate number of viral copies (<250 copies / mL). A negative result must be combined with clinical observations, patient history, and epidemiological information.  Fact Sheet for Patients:   RoadLapTop.co.za  Fact Sheet for Healthcare Providers: http://kim-miller.com/  This test is not yet approved or  cleared by the [place] FDA and has been authorized for detection and/or diagnosis of SARS-CoV-2 by FDA under an Emergency Use Authorization (EUA).  This EUA will remain in effect (meaning this test can be used) for the duration of the COVID-19 declaration under Section 564(b)(1) of the Act, 21 U.S.C. section 360bbb-3(b)(1), unless the authorization is terminated or revoked sooner.  Performed at [facility], [address] [place], [place] [address]   Blood Culture (routine x 2)     Status: None   Collection Time: 09/06/22  7:38 AM   Specimen: BLOOD  Result Value Ref Range Status   Specimen Description BLOOD LEFT ANTECUBITAL  Final   Special Requests   Final    BOTTLES DRAWN AEROBIC AND ANAEROBIC Blood Culture results may not be optimal due to an inadequate volume of blood received in culture  bottles   Culture   Final    NO GROWTH 5 DAYS Performed at [facility], [address] [place], [place] [address]    Report Status 09/11/2022 FINAL  Final  Blood Culture (routine x 2)     Status: None   Collection Time: 09/06/22  9:18 AM   Specimen: BLOOD  Result Value Ref Range Status   Specimen Description BLOOD RIGHT [facility]  Final   Special Requests   Final    BOTTLES DRAWN AEROBIC AND ANAEROBIC Blood Culture adequate volume   Culture   Final    NO GROWTH 5 DAYS Performed at [facility], [address], [place], [place] [address]    Report Status 09/11/2022 FINAL  Final  MRSA Next Gen by PCR, Nasal     Status: None   Collection Time: 09/07/22  6:00 AM   Specimen: Nasal Mucosa; Nasal Swab  Result Value Ref Range Status   MRSA by PCR Next Gen NOT DETECTED NOT DETECTED Final    Comment: (NOTE) The GeneXpert MRSA Assay (FDA approved for NASAL specimens only), is one component of a comprehensive MRSA colonization surveillance program. It is not intended to diagnose MRSA infection nor to guide or monitor treatment for MRSA infections. Test performance is not FDA approved in patients less than 47 years old. Performed at [facility], [address] [place], [place] [address]     Procedures and diagnostic studies:  No results found.             LOS: 9 days   Sheranda Seabrooks  [facility] [employer] loss adjuster on [email or web address]. If 7PM-7AM, please contact night-coverage at [email or web address]     09/15/2022, 4:25 PM

## 2022-09-16 DIAGNOSIS — R410 Disorientation, unspecified: Secondary | ICD-10-CM | POA: Diagnosis not present

## 2022-09-16 DIAGNOSIS — E538 Deficiency of other specified B group vitamins: Secondary | ICD-10-CM | POA: Diagnosis not present

## 2022-09-16 DIAGNOSIS — F03918 Unspecified dementia, unspecified severity, with other behavioral disturbance: Secondary | ICD-10-CM | POA: Diagnosis not present

## 2022-09-16 DIAGNOSIS — F419 Anxiety disorder, unspecified: Secondary | ICD-10-CM | POA: Diagnosis not present

## 2022-09-16 DIAGNOSIS — F32A Depression, unspecified: Secondary | ICD-10-CM | POA: Diagnosis not present

## 2022-09-16 NOTE — Progress Notes (Signed)
Progress Note    Terry Sloan  ZOX:[id number] DOB: Mar 27, 1941  DOA: 08/08/2022 PCP: Pcp, No      Brief Narrative:    Medical records reviewed and are as summarized below:  Terry Sloan is a 82 y.o. male  with history of dementia, behavioral disturbance, hyperlipidemia, BPH, insomnia, depression, anxiety, who presents to emergency department from [facility] rehab dementia department on 08/08/2022 for chief concerns of aggression with aggressive outburst towards staff.   Patient was initially IVC.  Behavioral health was consulted and determined that patient was not a danger to self or others.  Patient was pending placement, however was declined due to as needed Haldol doses were given.   On day of admission, nursing noticed that patient's temperature was low, decrease responsiveness/change in mentation, and hypotension.      Assessment/Plan:   Principal Problem:   Altered mental status Active Problems:   Mixed hyperlipidemia   History of TIA (transient ischemic attack)   Anxiety and depression   BPH (benign prostatic hyperplasia)   OAB (overactive bladder)   Dementia with behavioral disturbance   Hypothermia   Restless leg   Vitamin B12 deficiency   Body mass index is 24.74 kg/m.    Altered mental status With hypotension and hypothermia, now resolved..  Ammonia level was normal Continue supportive care.    Restless leg Continue Requip nightly   Hypothermia Resolved   Dementia with behavioral disturbance He still has intermittent agitation. Continue antipsychotics.  Use Geodon as needed for agitation.  Patient's last dose of Haldol was given on 09/04/2022 at 8:51 AM      BPH (benign prostatic hyperplasia) Continue Flomax    Anxiety and depression Ativan as needed for anxiety   Vitamin B12 deficiency  B12 level was 167 on 09/06/2022.  Continue with B12 injections    Diet Order             Diet regular Fluid consistency: Thin  Diet effective now                             Consultants: Psychiatrist  Procedures: None    Medications:    divalproex  250 mg Oral Q12H   donepezil  5 mg Oral Daily   enoxaparin (LOVENOX) injection  40 mg Subcutaneous Q24H   feeding supplement  237 mL Oral BID BM   melatonin  10 mg Oral QHS   OLANZapine  15 mg Oral BID   pravastatin  40 mg Oral QHS   tamsulosin  0.4 mg Oral QPC supper   Continuous Infusions:   Anti-infectives (From admission, onward)    None              Family Communication/Anticipated D/C date and plan/Code Status   DVT prophylaxis: enoxaparin (LOVENOX) injection 40 mg Start: 09/06/22 2200 Place TED hose Start: 09/06/22 1609     Code Status: Full Code  Family Communication: None Disposition Plan: Plan to discharge to SNF   Status is: Inpatient Remains inpatient appropriate because: Awaiting placement to SNF       Subjective:   Interval events noted.  He is sleepy and cannot provide any history.  He received Ativan yesterday evening because of persistent agitation after he was given Geodon injection.  He has a Comptroller at the bedside.  Objective:    Vitals:   09/15/22 1948 09/16/22 0652 09/16/22 0900 09/16/22 1100  BP: 130/77 (!) 95/51 (!) 98/57  97/66  Pulse: 84 71 79 64  Resp: Temp: 98.4 F (36.9 C) 98.9 F (37.2 C) 98.2 F (36.8 C)   TempSrc: Axillary  Axillary   SpO2: 98% 99% 99%   Weight:      Height:       No data found.   Intake/Output Summary (Last 24 hours) at 09/16/2022 1330 Last data filed at 09/16/2022 1053 Gross per 24 hour  Intake 0 ml  Output 450 ml  Net -450 ml   Filed Weights   08/08/22 1205 08/14/22 0038 09/07/22 0057  Weight: 81 kg 78.2 kg 78.2 kg    Exam:  GEN: NAD, sitting up in the chair SKIN: Warm and dry EYES: No acute abnormality ENT: MMM CV: RRR PULM: CTA B ABD: soft, ND, NT, +BS CNS: Sleepy, confused EXT: No edema or tenderness     Data Reviewed:   I have  personally reviewed following labs and imaging studies:  Labs: Labs show the following:   Basic Metabolic Panel: Recent Labs  Lab 09/15/22 0440  NA 139  K 3.8  CL 107  CO2 26  GLUCOSE 91  BUN 30*  CREATININE 1.09  CALCIUM 8.5*  MG 2.1  PHOS 4.1    GFR Estimated Creatinine Clearance: 53 mL/min (by C-G formula based on SCr of 1.09 mg/dL). Liver Function Tests: No results for input(s): "AST", "ALT", "ALKPHOS", "BILITOT", "PROT", "ALBUMIN" in the last 168 hours. No results for input(s): "LIPASE", "AMYLASE" in the last 168 hours. No results for input(s): "AMMONIA" in the last 168 hours.  Coagulation profile No results for input(s): "INR", "PROTIME" in the last 168 hours.  CBC: Recent Labs  Lab 09/15/22 0440  WBC 6.3  NEUTROABS 4.2  HGB 11.1*  HCT 34.6*  MCV 95.1  PLT 237    Cardiac Enzymes: No results for input(s): "CKTOTAL", "CKMB", "CKMBINDEX", "TROPONINI" in the last 168 hours. BNP (last 3 results) No results for input(s): "PROBNP" in the last 8760 hours. CBG: No results for input(s): "GLUCAP" in the last 168 hours. D-Dimer: No results for input(s): "DDIMER" in the last 72 hours. Hgb A1c: No results for input(s): "HGBA1C" in the last 72 hours. Lipid Profile: No results for input(s): "CHOL", "HDL", "LDLCALC", "TRIG", "CHOLHDL", "LDLDIRECT" in the last 72 hours. Thyroid function studies: No results for input(s): "TSH", "T4TOTAL", "T3FREE", "THYROIDAB" in the last 72 hours.  Invalid input(s): "FREET3" Anemia work up: No results for input(s): "VITAMINB12", "FOLATE", "FERRITIN", "TIBC", "IRON", "RETICCTPCT" in the last 72 hours. Sepsis Labs: Recent Labs  Lab 09/15/22 0440  WBC 6.3     Microbiology Recent Results (from the past 240 hour(s))  MRSA Next Gen by PCR, Nasal     Status: None   Collection Time: 09/07/22  6:00 AM   Specimen: Nasal Mucosa; Nasal Swab  Result Value Ref Range Status   MRSA by PCR Next Gen NOT DETECTED NOT DETECTED Final     Comment: (NOTE) The GeneXpert MRSA Assay (FDA approved for NASAL specimens only), is one component of a comprehensive MRSA colonization surveillance program. It is not intended to diagnose MRSA infection nor to guide or monitor treatment for MRSA infections. Test performance is not FDA approved in patients less than 5 years old. Performed at [facility], [address] [place], [place] [address]     Procedures and diagnostic studies:  No results found.             LOS: 10 days   Quenten Nawaz  [employer] loss adjuster on [email or web address]. If 7PM-7AM, please contact night-coverage at [email or web address]     09/16/2022, 1:30 PM

## 2022-09-16 NOTE — TOC Progression Note (Signed)
Transition of Care [facility]) - Progression Note    Patient Details  Name: Terry Sloan MRN: [id number] Date of Birth: November 18, 1940  Transition of Care [facility]) CM/SW Contact  Darleene Cleaver, [place] Phone Number: 09/16/2022, 7:18 PM  Clinical Narrative:    [facility] still reviewing patient's information.   Expected Discharge Plan:  (TBD) Barriers to Discharge: Continued Medical Work up  Expected Discharge Plan and Services                                               Social Determinants of Health (SDOH) Interventions SDOH Screenings   Food Insecurity: Patient Unable To Answer (09/07/2022)  Housing: Low Risk  (09/07/2022)  Transportation Needs: Patient Unable To Answer (09/07/2022)  Utilities: Not At Risk (07/23/2022)  Alcohol Screen: Low Risk  (07/23/2022)  Depression (PHQ2-9): Low Risk  (07/23/2022)  Recent Concern: Depression (PHQ2-9) - Medium Risk (06/17/2022)  Financial Resource Strain: Low Risk  (07/23/2022)  Physical Activity: Insufficiently Active (07/23/2022)  Social Connections: Socially Isolated (07/23/2022)  Stress: No Stress Concern Present (07/23/2022)  Tobacco Use: Medium Risk (09/11/2022)    Readmission Risk Interventions     No data to display

## 2022-09-17 DIAGNOSIS — E538 Deficiency of other specified B group vitamins: Secondary | ICD-10-CM | POA: Diagnosis not present

## 2022-09-17 DIAGNOSIS — F03918 Unspecified dementia, unspecified severity, with other behavioral disturbance: Secondary | ICD-10-CM | POA: Diagnosis not present

## 2022-09-17 DIAGNOSIS — R410 Disorientation, unspecified: Secondary | ICD-10-CM | POA: Diagnosis not present

## 2022-09-17 MED ORDER — HALOPERIDOL 5 MG PO TABS
5.0000 mg | ORAL_TABLET | Freq: Two times a day (BID) | ORAL | Status: DC
  Administered 2022-09-17 – 2022-09-18 (×2): 5 mg via ORAL
  Filled 2022-09-17 (×3): qty 1

## 2022-09-17 NOTE — Progress Notes (Addendum)
Progress Note    Terry Sloan  ZOX:[id number] DOB: 11/17/1940  DOA: 08/08/2022 PCP: Pcp, No      Brief Narrative:    Medical records reviewed and are as summarized below:  Terry Sloan is a 82 y.o. male  with history of dementia, behavioral disturbance, hyperlipidemia, BPH, insomnia, depression, anxiety, who presents to emergency department from [facility] rehab dementia department on 08/08/2022 for chief concerns of aggression with aggressive outburst towards staff.   Patient was initially IVC.  Behavioral health was consulted and determined that patient was not a danger to self or others.  Patient was pending placement, however was declined due to as needed Haldol doses were given.   On day of admission, nursing noticed that patient's temperature was low, decrease responsiveness/change in mentation, and hypotension.      Assessment/Plan:   Principal Problem:   Altered mental status Active Problems:   Mixed hyperlipidemia   History of TIA (transient ischemic attack)   Anxiety and depression   BPH (benign prostatic hyperplasia)   OAB (overactive bladder)   Dementia with behavioral disturbance   Hypothermia   Restless leg   Vitamin B12 deficiency   Body mass index is 24.74 kg/m.    Altered mental status With hypotension and hypothermia, now resolved..  Ammonia level was normal Continue supportive care.    Restless leg Continue Requip nightly   Hypothermia Resolved   Dementia with behavioral disturbance, intermittent agitation Consulted psychiatrist again on 09/17/2022.  It was recommended that olanzapine be replaced with Haldol.  Weekly EKGs to monitor QTc interval while on antipsychotics.  Continue donepezil and Geodon IM as needed. Discussed with Thurston Hole, NP, our psychiatric team.   BPH (benign prostatic hyperplasia) Continue Flomax    Anxiety and depression Ativan as needed for anxiety   Vitamin B12 deficiency  B12 level was 167 on  09/06/2022.  Continue with B12 injections    Diet Order             Diet regular Fluid consistency: Thin  Diet effective now                            Consultants: Psychiatrist  Procedures: None    Medications:    divalproex  250 mg Oral Q12H   donepezil  5 mg Oral Daily   enoxaparin (LOVENOX) injection  40 mg Subcutaneous Q24H   feeding supplement  237 mL Oral BID BM   haloperidol  5 mg Oral BID   melatonin  10 mg Oral QHS   pravastatin  40 mg Oral QHS   tamsulosin  0.4 mg Oral QPC supper   Continuous Infusions:   Anti-infectives (From admission, onward)    None              Family Communication/Anticipated D/C date and plan/Code Status   DVT prophylaxis: enoxaparin (LOVENOX) injection 40 mg Start: 09/06/22 2200 Place TED hose Start: 09/06/22 1609     Code Status: Full Code  Family Communication: None Disposition Plan: Plan to discharge to SNF   Status is: Inpatient Remains inpatient appropriate because: Awaiting placement to SNF       Subjective:   Interval events noted.  He is confused and cannot provide any history.  Sitter at the bedside.  Objective:    Vitals:   09/16/22 1923 09/17/22 0717 09/17/22 0913 09/17/22 1300  BP: (!) 138/99 (!) 138/90  110/62  Pulse: 93 65 82  Resp: Temp: 99.1 F (37.3 C) 98.9 F (37.2 C) 98.2 F (36.8 C)   TempSrc:   Axillary   SpO2: 99% 92% 96%   Weight:      Height:       No data found.   Intake/Output Summary (Last 24 hours) at 09/17/2022 1426 Last data filed at 09/17/2022 0700 Gross per 24 hour  Intake --  Output 600 ml  Net -600 ml   Filed Weights   08/08/22 1205 08/14/22 0038 09/07/22 0057  Weight: 81 kg 78.2 kg 78.2 kg    Exam:  GEN: NAD SKIN: Warm and dry EYES: No pallor or icterus ENT: MMM CV: RRR PULM: CTA B ABD: soft, ND, NT, +BS CNS: Alert but confused EXT: No edema or tenderness      Data Reviewed:   I have personally reviewed  following labs and imaging studies:  Labs: Labs show the following:   Basic Metabolic Panel: Recent Labs  Lab 09/15/22 0440  NA 139  K 3.8  CL 107  CO2 26  GLUCOSE 91  BUN 30*  CREATININE 1.09  CALCIUM 8.5*  MG 2.1  PHOS 4.1    GFR Estimated Creatinine Clearance: 53 mL/min (by C-G formula based on SCr of 1.09 mg/dL). Liver Function Tests: No results for input(s): "AST", "ALT", "ALKPHOS", "BILITOT", "PROT", "ALBUMIN" in the last 168 hours. No results for input(s): "LIPASE", "AMYLASE" in the last 168 hours. No results for input(s): "AMMONIA" in the last 168 hours.  Coagulation profile No results for input(s): "INR", "PROTIME" in the last 168 hours.  CBC: Recent Labs  Lab 09/15/22 0440  WBC 6.3  NEUTROABS 4.2  HGB 11.1*  HCT 34.6*  MCV 95.1  PLT 237    Cardiac Enzymes: No results for input(s): "CKTOTAL", "CKMB", "CKMBINDEX", "TROPONINI" in the last 168 hours. BNP (last 3 results) No results for input(s): "PROBNP" in the last 8760 hours. CBG: No results for input(s): "GLUCAP" in the last 168 hours. D-Dimer: No results for input(s): "DDIMER" in the last 72 hours. Hgb A1c: No results for input(s): "HGBA1C" in the last 72 hours. Lipid Profile: No results for input(s): "CHOL", "HDL", "LDLCALC", "TRIG", "CHOLHDL", "LDLDIRECT" in the last 72 hours. Thyroid function studies: No results for input(s): "TSH", "T4TOTAL", "T3FREE", "THYROIDAB" in the last 72 hours.  Invalid input(s): "FREET3" Anemia work up: No results for input(s): "VITAMINB12", "FOLATE", "FERRITIN", "TIBC", "IRON", "RETICCTPCT" in the last 72 hours. Sepsis Labs: Recent Labs  Lab 09/15/22 0440  WBC 6.3     Microbiology No results found for this or any previous visit (from the past 240 hour(s)).   Procedures and diagnostic studies:  No results found.             LOS: 11 days   [name]  [employer] loss adjuster on [email or web address]. If 7PM-7AM, please contact  night-coverage at [email or web address]     09/17/2022, 2:26 PM

## 2022-09-17 NOTE — Consult Note (Addendum)
[facility] Face-to-Face Psychiatry Consult   Reason for Consult:  Psychiatric Evaluation  Referring Physician:  Dr, Myriam Forehand  Patient Identification: Terry Sloan MRN:  [id number] Principal Diagnosis: Dementia with behavioral disturbance Diagnosis:  Principal Problem:   Dementia with behavioral disturbance Active Problems:   Mixed hyperlipidemia   History of TIA (transient ischemic attack)   Anxiety and depression   BPH (benign prostatic hyperplasia)   OAB (overactive bladder)   Altered mental status   Hypothermia   Restless leg   Vitamin B12 deficiency   Total Time spent with patient: 30 minutes  Subjective:    Terry Sloan is a 82 y.o. male patient with a known history of dementia exhibiting severe behavioral disturbances, including aggression and physical violence, necessitating frequent administration of as needed IM medications, being seen at the request of the medical team for "Dementia with aggressive behavior".   Chart reviewed and patient seen face-to-face.  Case reviewed with [facility] physician, Dr. Marlou Porch, who recommended discontinuing the olanzapine due to it's ineffectiveness in managing symptoms; Initiate Haldol 5 mg BID. Weekly EKGs recommended to monitor QTc   On initial approach this morning, patient observed sleeping soundly, under the observation of a one-to-one sitter for safety precautions, with his daughter, Terry Sloan and son, Terry Sloan at the bedside. Despite not being awakened for assessment, the family was involved in a care plan discussion. Discussed plan to discontinue olanzapine and initiate haloperidol 5 mg, orally, two times daily with patient's daughter, Terry Sloan and patient's son, Terry Sloan at the bedside. Discussed risks and benefits of medication. Family's verbal consent obtained for medication changes.  On re-approach this afternoon, the patient reportedly received ziprasidone 10 mg IM injection at 1435 for agitation. Post-injection, the patient was observed in bed with  mittens on bilateral hands to prevent self-harm or interference with medical equipment. Staff reports prior to injection, the patient was ambulated throughout the unit, his gait was unsteady, he exhibited behaviors such as grabbing at medical equipment and unseen items on the floor; these actions categorize him as a high fall risk. On evaluation, patient is drowsy and oriented to self only. Despite being drowsy, he responded to his name but remained mostly inattentive, with eyes closed and showing signs of restlessness and attempts to exit the bed.  His speech is slurred, and he engaged in minimal conversation. His mood is anxious, with a labile affect, hyperactive behavior, and impaired cognition and memory.  He appears to be responding to internal stimuli. He appears to endorse visual hallucinations reaching for unseen items on the bed.   HPI:  Per Dr. Myriam Forehand, Terry Sloan is a 82 y.o. male  with history of dementia, behavioral disturbance, hyperlipidemia, BPH, insomnia, depression, anxiety, who presents to emergency department from [facility] rehab dementia department on 08/08/2022 for chief concerns of aggression with aggressive outburst towards staff.   Patient was initially IVC.  Behavioral health was consulted and determined that patient was not a danger to self or others.  Patient was pending placement, however was declined due to as needed Haldol doses were given.   On day of admission, nursing noticed that patient's temperature was low, decrease responsiveness/change in mentation, and hypotension.  Past Psychiatric History: History of dementia with behavioral disturbances, worsening over the past 3 years. Admitted to [facility] 07/27/22 with aggressive behavior in the setting of worsening dementia. He presented to [facility] ED March 2024 with similar behaviors.   Risk to Self:   Risk to Others:   Prior Inpatient Therapy:  Prior Outpatient Therapy:    Past Medical History:  Past Medical History:   Diagnosis Date   Arthritis    Dementia    per EMS   GERD (gastroesophageal reflux disease)    [name]   History of kidney stones    Hyperlipidemia    Macular degeneration    PVD (peripheral vascular disease)    Stroke    TIA      Past Surgical History:  Procedure Laterality Date   Bone turmor Left    foreheah   EYE SURGERY Bilateral    cataract removal   FRACTURE SURGERY Left    leg   pins   KNEE ARTHROPLASTY     KNEE SURGERY Left    NECK SURGERY     Tumor reomoved from head/neck in the 60s   REPLACEMENT TOTAL KNEE Left 07/14/2018   ROTATOR CUFF REPAIR Right    times 2   TOTAL KNEE ARTHROPLASTY WITH HARDWARE REMOVAL Left 02/14/2017   Procedure: Removal Left Tibia Nail, Cemented Total Knee Arthroplasty;  Surgeon: Eldred Manges, MD;  Location: [facility] OR;  Service: Orthopedics;  Laterality: Left;   Family History:  Family History  Problem Relation Age of Onset   Stroke Neg Hx    Family Psychiatric  History: Unknown Social History:  Social History   Substance and Sexual Activity  Alcohol Use Yes   Comment: Occasional beer     Social History   Substance and Sexual Activity  Drug Use No    Social History   Socioeconomic History   Marital status: Media planner    Spouse name: Not on file   Number of children: 9   Years of education: Not on file   Highest education level: Not on file  Occupational History   Occupation: Retired  Tobacco Use   Smoking status: Former    Packs/day: 1.50    Years: 20.00    Additional pack years: 0.00    Total pack years: 30.00    Types: Cigarettes   Smokeless tobacco: Never   Tobacco comments:    Quit 20+ year ago  Vaping Use   Vaping Use: Never used  Substance and Sexual Activity   Alcohol use: Yes    Comment: Occasional beer   Drug use: No   Sexual activity: Not Currently  Other Topics Concern   Not on file  Social History Narrative   Lives at home w/ his significant other   Right-handed   Caffeine: some coffee, 3  diet Mt Dew's per day   Social Determinants of Health   Financial Resource Strain: Low Risk  (07/23/2022)   Overall Financial Resource Strain (CARDIA)    Difficulty of Paying Living Expenses: Not hard at all  Food Insecurity: Patient Unable To Answer (09/07/2022)   Hunger Vital Sign    Worried About Running Out of Food in the Last Year: Patient unable to answer    Ran Out of Food in the Last Year: Patient unable to answer  Transportation Needs: Patient Unable To Answer (09/07/2022)   PRAPARE - Transportation    Lack of Transportation (Medical): Patient unable to answer    Lack of Transportation (Non-Medical): Patient unable to answer  Physical Activity: Insufficiently Active (07/23/2022)   Exercise Vital Sign    Days of Exercise per Week: 3 days    Minutes of Exercise per Session: 30 min  Stress: No Stress Concern Present (07/23/2022)   [employer] of Occupational Health - Occupational Stress Questionnaire  Feeling of Stress : Not at all  Social Connections: Socially Isolated (07/23/2022)   Social Connection and Isolation Panel [NHANES]    Frequency of Communication with Friends and Family: More than three times a week    Frequency of Social Gatherings with Friends and Family: Never    Attends Religious Services: Never    Database administrator or Organizations: No    Attends Banker Meetings: Never    Marital Status: Widowed   Additional Social History:    Allergies:   Allergies  Allergen Reactions   Celebrex [Celecoxib] Other (See Comments)    Leg swelling, broke out in rash    Labs: No results found for this or any previous visit (from the past 48 hour(s)).  Current Facility-Administered Medications  Medication Dose Route Frequency Provider Last Rate Last Admin   divalproex (DEPAKOTE) DR tablet 250 mg  250 mg Oral Q12H Cox, Amy N, DO   250 mg at 09/17/22 0934   donepezil (ARICEPT) tablet 5 mg  5 mg Oral Daily Cox, Amy N, DO   5 mg at 09/17/22 0934    enoxaparin (LOVENOX) injection 40 mg  40 mg Subcutaneous Q24H Cox, Amy N, DO   40 mg at 09/16/22 2116   feeding supplement (ENSURE ENLIVE / ENSURE PLUS) liquid 237 mL  237 mL Oral BID BM Cox, Amy N, DO   237 mL at 09/17/22 1435   haloperidol (HALDOL) tablet 5 mg  5 mg Oral BID Krystofer Hevener H, NP       melatonin tablet 10 mg  10 mg Oral QHS Cox, Amy N, DO   10 mg at 09/16/22 2117   oxyCODONE (Oxy IR/ROXICODONE) immediate release tablet 5 mg  5 mg Oral Q8H PRN Lurene Shadow, MD   5 mg at 09/17/22 0934   pravastatin (PRAVACHOL) tablet 40 mg  40 mg Oral QHS Cox, Amy N, DO   40 mg at 09/16/22 2117   tamsulosin (FLOMAX) capsule 0.4 mg  0.4 mg Oral QPC supper Cox, Amy N, DO   0.4 mg at 09/16/22 1759   ziprasidone (GEODON) injection 10 mg  10 mg Intramuscular TID PRN Lolita Patella B, MD   10 mg at 09/17/22 1435    Musculoskeletal: Strength & Muscle Tone: within normal limits Gait & Station:  Did not observe  Patient leans: N/A            Psychiatric Specialty Exam:  Presentation  General Appearance:  Disheveled  Eye Contact: None  Speech: Slurred  Speech Volume: Decreased  Handedness: Right   Mood and Affect  Mood: Labile; Anxious  Affect: Labile   Thought Process  Thought Processes: Disorganized  Descriptions of Associations:Loose  Orientation:Partial (Oriented to self only)  Thought Content:Other (comment) (Minimal conversaton with this Clinical research associate)  History of Schizophrenia/Schizoaffective disorder:No data recorded Duration of Psychotic Symptoms:No data recorded Hallucinations:Hallucinations: Auditory; Visual Description of Auditory Hallucinations: Appears to be responding to internal stimuli. Description of Visual Hallucinations: Appears to be reaching for unseen objects.  Ideas of Reference:Other (comment) (Unablel to assess)  Suicidal Thoughts:Suicidal Thoughts: -- (Unable to assess)  Homicidal Thoughts:Homicidal Thoughts: -- (Unable to  assess)   Sensorium  Memory: Immediate Poor; Recent Poor; Remote Poor  Judgment: Impaired  Insight: Lacking   Executive Functions  Concentration: Poor  Attention Span: Poor  Recall: Other (comment) (Unable to assess)  Fund of Knowledge: Other (comment) (Unable to assess)  Language: Poor   Psychomotor Activity  Psychomotor Activity:Psychomotor Activity: Restlessness  Assets  Assets: Financial Resources/Insurance; Social Support   Sleep  Sleep:Sleep: Fair   Physical Exam: Physical Exam Vitals and nursing note reviewed.  Constitutional:      Appearance: He is ill-appearing.     Comments: Drowsy   HENT:     Head: Normocephalic and atraumatic.     Nose: Nose normal.  Pulmonary:     Effort: Pulmonary effort is normal.  Musculoskeletal:        General: Normal range of motion.     Cervical back: Normal range of motion.  Neurological:     Mental Status: He is disoriented.  Psychiatric:        Attention and Perception: He is inattentive. He perceives visual hallucinations.        Mood and Affect: Mood is anxious. Affect is labile.        Speech: Speech is slurred.        Behavior: Behavior is hyperactive.        Cognition and Memory: Cognition is impaired. Memory is impaired. He exhibits impaired recent memory and impaired remote memory.        Judgment: Judgment is impulsive.    ROS Blood pressure 114/77, pulse 89, temperature 100.2 F (37.9 C), resp. rate 16, height 5\' 10"  (1.778 m), weight 78.2 kg, SpO2 95 %. Body mass index is 24.74 kg/m.  Treatment Plan Summary: Case reviewed with [facility] physician, Dr. Marlou Porch, who recommended discontinuing the olanzapine due to it's lack of efficacy to manage symptoms; Initiate Haldol 5 mg BID. Recommend weekly EKGs to monitor QTc; There is no indication patient would benefit from geriatric inpatient psychiatric admission at this time.  Medication management and Plan reviewed with Dr. Marlou Porch and Dr. Myriam Forehand.     Disposition: No evidence of imminent risk to self or others at present.   Patient does not meet criteria for psychiatric inpatient admission. Supportive therapy provided about ongoing stressors.  Norma Fredrickson, NP 09/17/2022 4:37 PM

## 2022-09-17 NOTE — Progress Notes (Signed)
Physical Therapy Treatment Patient Details Name: Terry Sloan MRN: [id number] DOB: Nov 23, 1940 Today's Date: 09/17/2022   History of Present Illness 82 year old male is a 82 year old male with history of dementia, behavioral disturbance, hyperlipidemia, BPH, insomnia, depression, anxiety, who presented to emergency department from [facility] rehab dementia department on 08/08/2022 for chief concerns of aggression with aggressive outburst towards staff. On day of admission, nursing noticed that patient's temperature was low, decrease responsiveness/change in mentation, and hypotension.    PT Comments    Pt was long sitting in bed with BUE Mitts donned and sitter in place. He is alert but only oriented to self. Does follow simple one step commands consistently throughout. Increased processing time + tactile cues throughout to perform desired task requested of him. He was able to exit bed, stand to RW and ambulate. Mostly requires Min assist to safely perform. Overall pt is progressing however cognition greatly impacts session progression. Acute PT will continue to follow per current POC. Recommend continued skilled PT at DC to maximize his safety with all ADLs.    Recommendations for follow up therapy are one component of a multi-disciplinary discharge planning process, led by the attending physician.  Recommendations may be updated based on patient status, additional functional criteria and insurance authorization.     Assistance Recommended at Discharge Frequent or constant Supervision/Assistance  Patient can return home with the following A little help with walking and/or transfers;A lot of help with bathing/dressing/bathroom;Assistance with cooking/housework;Assistance with feeding;Direct supervision/assist for medications management;Direct supervision/assist for financial management;Assist for transportation;Help with stairs or ramp for entrance   Equipment Recommendations  Other (comment) (Defer  to next level of care)       Precautions / Restrictions Precautions Precautions: Fall Restrictions Weight Bearing Restrictions: No     Mobility  Bed Mobility Overal bed mobility: Needs Assistance Bed Mobility: Supine to Sit, Sit to Supine  Supine to sit: Min assist Sit to supine: Min assist     Transfers Overall transfer level: Needs assistance Equipment used: Rolling walker (2 wheels) Transfers: Sit to/from Stand Sit to Stand: Supervision    General transfer comment: pt requires increased time + alot of vcs for safe technique    Ambulation/Gait Ambulation/Gait assistance: Min guard, Min assist Gait Distance (Feet): 200 Feet Assistive device: Rolling walker (2 wheels) Gait Pattern/deviations: Decreased step length - right, Decreased step length - left, Trunk flexed, Drifts right/left, Narrow base of support Gait velocity: decreased  General Gait Details: pt has poor standing posture throughout all standing activity and during gait training. He required occasional min assist for navigating RW. several standing rest however overall tolerated well   Balance Overall balance assessment: Needs assistance Sitting-balance support: Feet supported Sitting balance-Leahy Scale: Good  Standing balance support: During functional activity Standing balance-Leahy Scale: Poor Standing balance comment: high fall risk due to poor safety awareness and poor insight of his deficits       Cognition Arousal/Alertness: Awake/alert Behavior During Therapy: Flat affect Overall Cognitive Status: No family/caregiver present to determine baseline cognitive functioning  General Comments: Pt is alert but only oriented to self               Pertinent Vitals/Pain Pain Assessment Pain Assessment: No/denies pain     PT Goals (current goals can now be found in the care plan section) Acute Rehab PT Goals Patient Stated Goal: none stated Progress towards PT goals: Progressing toward goals     Frequency    Min 2X/week  PT Plan Current plan remains appropriate       AM-PAC PT "6 Clicks" Mobility   Outcome Measure  Help needed turning from your back to your side while in a flat bed without using bedrails?: A Little Help needed moving from lying on your back to sitting on the side of a flat bed without using bedrails?: A Little Help needed moving to and from a bed to a chair (including a wheelchair)?: A Little Help needed standing up from a chair using your arms (e.g., wheelchair or bedside chair)?: A Little Help needed to walk in hospital room?: A Little Help needed climbing 3-5 steps with a railing? : A Lot 6 Click Score: 17    End of Session Equipment Utilized During Treatment: Gait belt Activity Tolerance: Patient tolerated treatment well Patient left: in bed;with call bell/phone within reach;with bed alarm set;with nursing/sitter in room Psychiatrist present) Nurse Communication: Mobility status PT Visit Diagnosis: Unsteadiness on feet (R26.81);Muscle weakness (generalized) (M62.81);Other abnormalities of gait and mobility (R26.89)     Time: 1610-9604 PT Time Calculation (min) (ACUTE ONLY): 25 min  Charges:  $Gait Training: 8-22 mins $Therapeutic Activity: 8-22 mins                     Jetta Lout PTA 09/17/22, 10:01 AM

## 2022-09-18 DIAGNOSIS — F03918 Unspecified dementia, unspecified severity, with other behavioral disturbance: Secondary | ICD-10-CM | POA: Diagnosis not present

## 2022-09-18 MED ORDER — ROPINIROLE HCL 1 MG PO TABS
0.5000 mg | ORAL_TABLET | Freq: Every day | ORAL | Status: DC
  Administered 2022-09-18 – 2022-11-08 (×50): 0.5 mg via ORAL
  Filled 2022-09-18 (×53): qty 1

## 2022-09-18 MED ORDER — QUETIAPINE FUMARATE 25 MG PO TABS
25.0000 mg | ORAL_TABLET | Freq: Two times a day (BID) | ORAL | Status: DC
  Administered 2022-09-18 – 2022-09-19 (×4): 25 mg via ORAL
  Filled 2022-09-18 (×4): qty 1

## 2022-09-18 MED ORDER — DIVALPROEX SODIUM 125 MG PO CSDR
250.0000 mg | DELAYED_RELEASE_CAPSULE | Freq: Two times a day (BID) | ORAL | Status: DC
  Administered 2022-09-18 – 2022-09-29 (×23): 250 mg via ORAL
  Filled 2022-09-18 (×23): qty 2

## 2022-09-18 MED ORDER — HALOPERIDOL 5 MG PO TABS
5.0000 mg | ORAL_TABLET | Freq: Two times a day (BID) | ORAL | Status: DC | PRN
  Administered 2022-09-19 – 2022-10-02 (×15): 5 mg via ORAL
  Filled 2022-09-18 (×16): qty 1

## 2022-09-18 MED ORDER — ACETAMINOPHEN 325 MG PO TABS
650.0000 mg | ORAL_TABLET | Freq: Four times a day (QID) | ORAL | Status: DC | PRN
  Administered 2022-09-18 – 2022-10-29 (×44): 650 mg via ORAL
  Filled 2022-09-18 (×46): qty 2

## 2022-09-18 MED ORDER — HALOPERIDOL LACTATE 5 MG/ML IJ SOLN
5.0000 mg | Freq: Two times a day (BID) | INTRAMUSCULAR | Status: DC | PRN
  Administered 2022-09-20 – 2022-09-24 (×2): 5 mg via INTRAMUSCULAR
  Filled 2022-09-18 (×3): qty 1

## 2022-09-18 MED ORDER — TRAZODONE HCL 50 MG PO TABS
25.0000 mg | ORAL_TABLET | Freq: Every evening | ORAL | Status: DC | PRN
  Administered 2022-09-18 – 2022-09-21 (×4): 25 mg via ORAL
  Filled 2022-09-18 (×4): qty 1

## 2022-09-18 NOTE — Consult Note (Signed)
[facility] Face-to-Face Psychiatry Consult   Reason for Consult:  Psychiatric Evaluation  Referring Physician:  Dr, Myriam Forehand  Patient Identification: Terry Sloan MRN:  [id number] Principal Diagnosis: Dementia with behavioral disturbance Diagnosis:  Principal Problem:   Dementia with behavioral disturbance Active Problems:   Mixed hyperlipidemia   History of TIA (transient ischemic attack)   Anxiety and depression   BPH (benign prostatic hyperplasia)   OAB (overactive bladder)   Altered mental status   Hypothermia   Restless leg   Vitamin B12 deficiency   Total Time spent with patient: 30 minutes  Subjective:    Terry Sloan is a 82 y.o. male patient with a known history of dementia exhibiting severe behavioral disturbances, including aggression and physical violence, necessitating frequent administration of as needed IM medications.  On evaluation today, patient is seen lying in the bed with a sitter present at the bedside for fall precautions. He is alert and oriented to self only. He continues to exhibit signs of restlessness with attempts to exit the bed without assistance from staff. His mood is anxious, with a labile affect, and impaired cognition and memory.  He appears to be responding to internal and external stimuli. He continues to exhibit visual hallucinations reaching in the air for unseen objects. Staff reports he is extremely difficult to redirect and becomes combative. On review of MAR he has required the use of PRN Geodon injection twice within the past 24 hours due to aggressive and combative behaviors towards staff.   Recommendations for medication adjustments discussed with attending, Dr. Georgeann Oppenheim who agrees with treatment plan (see below). The Psych Team will continue to follow patient to assess and evaluate progress in treatment.    Past Psychiatric History: History of dementia with behavioral disturbances, worsening over the past 3 years. Admitted to [facility] 07/27/22 with  aggressive behavior in the setting of worsening dementia. He presented to [facility] ED March 2024 with similar behaviors.   Risk to Self:   Risk to Others:   Prior Inpatient Therapy:   Prior Outpatient Therapy:    Past Medical History:  Past Medical History:  Diagnosis Date   Arthritis    Dementia    per EMS   GERD (gastroesophageal reflux disease)    [name]   History of kidney stones    Hyperlipidemia    Macular degeneration    PVD (peripheral vascular disease)    Stroke    TIA      Past Surgical History:  Procedure Laterality Date   Bone turmor Left    foreheah   EYE SURGERY Bilateral    cataract removal   FRACTURE SURGERY Left    leg   pins   KNEE ARTHROPLASTY     KNEE SURGERY Left    NECK SURGERY     Tumor reomoved from head/neck in the 60s   REPLACEMENT TOTAL KNEE Left 07/14/2018   ROTATOR CUFF REPAIR Right    times 2   TOTAL KNEE ARTHROPLASTY WITH HARDWARE REMOVAL Left 02/14/2017   Procedure: Removal Left Tibia Nail, Cemented Total Knee Arthroplasty;  Surgeon: Eldred Manges, MD;  Location: [facility] OR;  Service: Orthopedics;  Laterality: Left;   Family History:  Family History  Problem Relation Age of Onset   Stroke Neg Hx    Family Psychiatric  History: Unknown Social History:  Social History   Substance and Sexual Activity  Alcohol Use Yes   Comment: Occasional beer     Social History   Substance and Sexual  Activity  Drug Use No    Social History   Socioeconomic History   Marital status: Media planner    Spouse name: Not on file   Number of children: 9   Years of education: Not on file   Highest education level: Not on file  Occupational History   Occupation: Retired  Tobacco Use   Smoking status: Former    Packs/day: 1.50    Years: 20.00    Additional pack years: 0.00    Total pack years: 30.00    Types: Cigarettes   Smokeless tobacco: Never   Tobacco comments:    Quit 20+ year ago  Vaping Use   Vaping Use: Never used  Substance and  Sexual Activity   Alcohol use: Yes    Comment: Occasional beer   Drug use: No   Sexual activity: Not Currently  Other Topics Concern   Not on file  Social History Narrative   Lives at home w/ his significant other   Right-handed   Caffeine: some coffee, 3 diet Mt Dew's per day   Social Determinants of Health   Financial Resource Strain: Low Risk  (07/23/2022)   Overall Financial Resource Strain (CARDIA)    Difficulty of Paying Living Expenses: Not hard at all  Food Insecurity: Patient Unable To Answer (09/07/2022)   Hunger Vital Sign    Worried About Running Out of Food in the Last Year: Patient unable to answer    Ran Out of Food in the Last Year: Patient unable to answer  Transportation Needs: Patient Unable To Answer (09/07/2022)   PRAPARE - Transportation    Lack of Transportation (Medical): Patient unable to answer    Lack of Transportation (Non-Medical): Patient unable to answer  Physical Activity: Insufficiently Active (07/23/2022)   Exercise Vital Sign    Days of Exercise per Week: 3 days    Minutes of Exercise per Session: 30 min  Stress: No Stress Concern Present (07/23/2022)   [employer] of Occupational Health - Occupational Stress Questionnaire    Feeling of Stress : Not at all  Social Connections: Socially Isolated (07/23/2022)   Social Connection and Isolation Panel [NHANES]    Frequency of Communication with Friends and Family: More than three times a week    Frequency of Social Gatherings with Friends and Family: Never    Attends Religious Services: Never    Database administrator or Organizations: No    Attends Banker Meetings: Never    Marital Status: Widowed   Additional Social History:    Allergies:   Allergies  Allergen Reactions   Celebrex [Celecoxib] Other (See Comments)    Leg swelling, broke out in rash    Labs: No results found for this or any previous visit (from the past 48 hour(s)).  Current Facility-Administered  Medications  Medication Dose Route Frequency Provider Last Rate Last Admin   acetaminophen (TYLENOL) tablet 650 mg  650 mg Oral Q6H PRN [place] H, NP   650 mg at 09/19/22 0932   divalproex (DEPAKOTE SPRINKLE) capsule 250 mg  250 mg Oral Q12H Jaynie Bream, RPH   250 mg at 09/19/22 0935   donepezil (ARICEPT) tablet 5 mg  5 mg Oral Daily Cox, Amy N, DO   5 mg at 09/19/22 0931   enoxaparin (LOVENOX) injection 40 mg  40 mg Subcutaneous Q24H Cox, Amy N, DO   40 mg at 09/18/22 2104   feeding supplement (ENSURE ENLIVE / ENSURE PLUS) liquid 237 mL  237 mL Oral BID BM Cox, Amy N, DO   237 mL at 09/19/22 0935   haloperidol (HALDOL) tablet 5 mg  5 mg Oral BID PRN Tammera Engert H, NP       Or   haloperidol lactate (HALDOL) injection 5 mg  5 mg Intramuscular BID PRN Braelynn Lupton H, NP       pravastatin (PRAVACHOL) tablet 40 mg  40 mg Oral QHS Cox, Amy N, DO   40 mg at 09/18/22 2058   QUEtiapine (SEROQUEL) tablet 25 mg  25 mg Oral BID Carlon Chaloux H, NP   25 mg at 09/19/22 0931   rOPINIRole (REQUIP) tablet 0.5 mg  0.5 mg Oral QHS Verble Styron H, NP   0.5 mg at 09/18/22 2058   tamsulosin (FLOMAX) capsule 0.4 mg  0.4 mg Oral QPC supper Cox, Amy N, DO   0.4 mg at 09/18/22 1753   traZODone (DESYREL) tablet 25 mg  25 mg Oral QHS PRN Jeaneane Adamec H, NP   25 mg at 09/18/22 2059    Musculoskeletal: Strength & Muscle Tone: within normal limits Gait & Station:  Did not observe  Patient leans: N/A            Psychiatric Specialty Exam:  Presentation  General Appearance:  Disheveled  Eye Contact: None  Speech: Slurred  Speech Volume: Decreased  Handedness: Right   Mood and Affect  Mood: Labile; Anxious  Affect: Labile   Thought Process  Thought Processes: Disorganized  Descriptions of Associations:Loose  Orientation:Partial (Oriented to self only)  Thought Content:Other (comment) (Minimal conversaton with this Clinical research associate)  History of  Schizophrenia/Schizoaffective disorder:No data recorded Duration of Psychotic Symptoms:No data recorded Hallucinations:No data recorded  Ideas of Reference:Other (comment) (Unablel to assess)  Suicidal Thoughts:No data recorded  Homicidal Thoughts:No data recorded   Sensorium  Memory: Immediate Poor; Recent Poor; Remote Poor  Judgment: Impaired  Insight: Lacking   Executive Functions  Concentration: Poor  Attention Span: Poor  Recall: Other (comment) (Unable to assess)  Fund of Knowledge: Other (comment) (Unable to assess)  Language: Poor   Psychomotor Activity  Psychomotor Activity:No data recorded   Assets  Assets: Financial Resources/Insurance; Social Support   Sleep  Sleep:No data recorded   Physical Exam: Physical Exam Vitals and nursing note reviewed.  HENT:     Head: Normocephalic and atraumatic.     Nose: Nose normal.  Pulmonary:     Effort: Pulmonary effort is normal.  Musculoskeletal:        General: Normal range of motion.     Cervical back: Normal range of motion.  Skin:    General: Skin is dry.  Neurological:     Mental Status: He is alert. He is disoriented.     Comments: Oriented to self only   Psychiatric:        Attention and Perception: He perceives auditory and visual hallucinations.        Mood and Affect: Mood is anxious. Affect is labile.        Speech: Speech is tangential.        Behavior: Behavior is cooperative.        Cognition and Memory: Cognition is impaired. Memory is impaired. He exhibits impaired recent memory and impaired remote memory.        Judgment: Judgment is impulsive.    ROS Blood pressure 118/66, pulse 69, temperature 97.9 F (36.6 C), resp. rate 18, height 5\' 10"  (1.778 m), weight 78.2 kg, SpO2 98 %. Body mass  index is 24.74 kg/m.  Treatment Plan Summary: Recommend weekly EKGs to monitor QTc; There is no indication patient would benefit from geriatric inpatient psychiatric admission at this  time. Daily contact with patient to assess and evaluate symptoms and progress in treatment, Medication management, and Plan reviewed with Dr. Georgeann Oppenheim.  START  1) Haloperidol 5 mg IM TID PRN for severe agitation  2) Quetiapine 25 mg BID for management of hallucinations and aggressive behaviors.  3) Trazodone 25 mg at bedtime PRN for sleep support also beneficial for mood stabilization  RESTART  1) Ropinirole 0.5 mg daily at bedtime for restless legs 2) Tylenol 650 mg Q 6* PRN for pain  DISCONTINUE 1) Haloperidol 5 mg BID due to it's lack of efficacy to manage ongoing hallucinations and aggressive behaviors. 2) Oxycodone IR 5 mg Q 8* PRN - opioid use could have a negative effect on cognitive function in older adults which could be contributing to his confusion.  3) Melatonin 10 mg daily at bedtime - due to it's lack of efficacy to manage sleep support 4) Ziprasidone injection TID PRN - due to risk of cardiac side effects in older population   Disposition: No evidence of imminent risk to self or others at present.   Patient does not meet criteria for psychiatric inpatient admission. Supportive therapy provided about ongoing stressors.  Norma Fredrickson, NP 09/19/2022 12:14 PM

## 2022-09-18 NOTE — Progress Notes (Signed)
PROGRESS NOTE    Terry Sloan  JYN:[id number] DOB: 28-Dec-1940 DOA: 08/08/2022 PCP: Pcp, No    Brief Narrative:  82 year old male is a 82 year old male with history of dementia, behavioral disturbance, hyperlipidemia, BPH, insomnia, depression, anxiety, who presents to emergency department from [facility] rehab dementia department on 08/08/2022 for chief concerns of aggression with aggressive outburst towards staff.   Patient was initially IVC.  Behavioral health was consulted and determined that patient was not a danger to self or others.  Patient was pending placement, however was declined due to as needed Haldol doses were given.   On day of admission, nursing noticed that patient's temperature was low, decrease responsiveness/change in mentation, and hypotension.   Serum sodium on day of admission was 139, potassium 4.0, chloride 109, bicarb 24, BUN of 25, serum creatinine 1.13, eGFR greater than 60, nonfasting blood glucose 92, WBC 5.8, hemoglobin 13.8, platelets of 259.   Blood cultures, are in process.  Procalcitonin was less than 0.10, lactic acid was within normal limits.  Magnesium level was 2.1.  UA was negative for leukocytes and nitrates.   Portable chest x-ray was read as rotated portable exam with lower lung volumes.  CT of the head without contrast: Was read as no evidence of acute intracranial abnormality.  Redemonstrated chronic cortical/subcortical infarcts within the right frontal and left occipital lobes.  Redemonstrated chronic infarct within the left cerebellar hemisphere.   Per EDP patient has not required one-to-one sitter over the last few days.  4/6: Patient admitted to the hospitalist service.  Hypothermia and decreased level of responsiveness of completely resolved  4/8: Patient had some agitation last night.  Required IM Geodon x 1 4/9: 2 family members at bedside this morning.  Relayed some of the barriers to discharge and the concern regarding difficulty finding a  disposition plan. 4/17: Appreciate psychiatry recommendations and adjustment of medications   Assessment & Plan:   Principal Problem:   Dementia with behavioral disturbance Active Problems:   Mixed hyperlipidemia   History of TIA (transient ischemic attack)   Anxiety and depression   BPH (benign prostatic hyperplasia)   OAB (overactive bladder)   Altered mental status   Hypothermia   Restless leg   Vitamin B12 deficiency  Altered mental status With hypotension and hypothermia, now resolved Ammonia normal Plan: Continue bedside sitter Delirium precautions Appreciate psychiatry recommendations regarding antipsychotic/psychiatric regimen  Restless leg Requip trial 0.5 mg nightly   Hypothermia Resolved   Dementia with behavioral disturbance Resumed home donepezil 5 mg daily, olanzapine 15 mg p.o. twice daily, Depakote 250 mg p.o. twice daily Appreciate psychiatry recommendations   BPH (benign prostatic hyperplasia) Flomax 0.4 mg nightly   Anxiety and depression Ativan 0.5 mg p.o. every 6 hours as needed for anxiety, 3 doses ordered  DVT prophylaxis: Lovenox Code Status: Full Family Communication: 2 family members at bedside 4/9 Disposition Plan: Status is: Inpatient Remains inpatient appropriate because: Unsafe discharge plan   Level of care: Telemetry Medical  Consultants:  None  Procedures:  None  Antimicrobials: None   Subjective: Seen and examined.  Physical therapy.  Mittens in place.  Sitter at bedside.  Objective: Vitals:   09/17/22 1300 09/17/22 1605 09/17/22 1908 09/18/22 0721  BP: 110/62 114/77 99/74 (!) 88/59  Pulse:  89 90 73  Resp:  Temp:  100.2 F (37.9 C) 99.2 F (37.3 C) 98 F (36.7 C)  TempSrc:      SpO2:  95% 99% 96%  Weight:      Height:        Intake/Output Summary (Last 24 hours) at 09/18/2022 1526 Last data filed at 09/18/2022 1439 Gross per 24 hour  Intake --  Output 900 ml  Net -900 ml   Filed Weights    08/08/22 1205 08/14/22 0038 09/07/22 0057  Weight: 81 kg 78.2 kg 78.2 kg    Examination:  General exam: NAD.  Appears fatigued and confused. Respiratory system: Clear to auscultation. Respiratory effort normal. Cardiovascular system: S1-S2, RRR, no murmurs, no pedal edema Gastrointestinal system: Soft, NT/ND, normal bowel sounds Central nervous system: Awake, oriented x 2, no focal deficits Extremities: Symmetric 5 x 5 power. Skin: No rashes, lesions or ulcers Psychiatry: Judgement and insight appear impaired. Mood & affect confused.     Data Reviewed: I have personally reviewed following labs and imaging studies  CBC: Recent Labs  Lab 09/15/22 0440  WBC 6.3  NEUTROABS 4.2  HGB 11.1*  HCT 34.6*  MCV 95.1  PLT 237   Basic Metabolic Panel: Recent Labs  Lab 09/15/22 0440  NA 139  K 3.8  CL 107  CO2 26  GLUCOSE 91  BUN 30*  CREATININE 1.09  CALCIUM 8.5*  MG 2.1  PHOS 4.1   GFR: Estimated Creatinine Clearance: 53 mL/min (by C-G formula based on SCr of 1.09 mg/dL). Liver Function Tests: No results for input(s): "AST", "ALT", "ALKPHOS", "BILITOT", "PROT", "ALBUMIN" in the last 168 hours. No results for input(s): "LIPASE", "AMYLASE" in the last 168 hours. No results for input(s): "AMMONIA" in the last 168 hours.  Coagulation Profile: No results for input(s): "INR", "PROTIME" in the last 168 hours. Cardiac Enzymes: No results for input(s): "CKTOTAL", "CKMB", "CKMBINDEX", "TROPONINI" in the last 168 hours. BNP (last 3 results) No results for input(s): "PROBNP" in the last 8760 hours. HbA1C: No results for input(s): "HGBA1C" in the last 72 hours. CBG: No results for input(s): "GLUCAP" in the last 168 hours. Lipid Profile: No results for input(s): "CHOL", "HDL", "LDLCALC", "TRIG", "CHOLHDL", "LDLDIRECT" in the last 72 hours. Thyroid Function Tests: No results for input(s): "TSH", "T4TOTAL", "FREET4", "T3FREE", "THYROIDAB" in the last 72 hours.  Anemia  Panel: No results for input(s): "VITAMINB12", "FOLATE", "FERRITIN", "TIBC", "IRON", "RETICCTPCT" in the last 72 hours.  Sepsis Labs: No results for input(s): "PROCALCITON", "LATICACIDVEN" in the last 168 hours.   No results found for this or any previous visit (from the past 240 hour(s)).        Radiology Studies: No results found.      Scheduled Meds:  divalproex  250 mg Oral Q12H   donepezil  5 mg Oral Daily   enoxaparin (LOVENOX) injection  40 mg Subcutaneous Q24H   feeding supplement  237 mL Oral BID BM   pravastatin  40 mg Oral QHS   QUEtiapine  25 mg Oral BID   rOPINIRole  0.5 mg Oral QHS   tamsulosin  0.4 mg Oral QPC supper   Continuous Infusions:   LOS: 12 days     Tresa Moore, MD [facility]   If 7PM-7AM, please contact night-coverage  09/18/2022, 3:26 PM

## 2022-09-18 NOTE — Progress Notes (Signed)
Physical Therapy Treatment Patient Details Name: Terry Sloan MRN: [id number] DOB: 10/09/1940 Today's Date: 09/18/2022   History of Present Illness 82 year old male is a 82 year old male with history of dementia, behavioral disturbance, hyperlipidemia, BPH, insomnia, depression, anxiety, who presented to emergency department from [facility] rehab dementia department on 08/08/2022 for chief concerns of aggression with aggressive outburst towards staff. On day of admission, nursing noticed that patient's temperature was low, decrease responsiveness/change in mentation, and hypotension.    PT Comments    Pt received in bed, anxious to get out of bed. Pt demonstrated supine to sit with use of side rail and Supervision. Good sitting balance edge of bed. Pt stood from bed without physical assist and completed gait training 118ft with RW and CGA for safety. Good upright posture, no LOB, pt able to negotiate around objects without difficulty. Overall great tolerance for mobility, able to be redirected with verbal and visual cues. Pt remained on RA with SpO2 in mid to upper 90's throughout session.    Recommendations for follow up therapy are one component of a multi-disciplinary discharge planning process, led by the attending physician.  Recommendations may be updated based on patient status, additional functional criteria and insurance authorization.  Follow Up Recommendations  Can patient physically be transported by private vehicle: Yes    Assistance Recommended at Discharge Frequent or constant Supervision/Assistance  Patient can return home with the following A little help with walking and/or transfers;A lot of help with bathing/dressing/bathroom;Assistance with cooking/housework;Assistance with feeding;Direct supervision/assist for medications management;Direct supervision/assist for financial management;Assist for transportation;Help with stairs or ramp for entrance   Equipment Recommendations   Other (comment) (TBD at next facility)    Recommendations for Other Services       Precautions / Restrictions Precautions Precautions: Fall Restrictions Weight Bearing Restrictions: No     Mobility  Bed Mobility Overal bed mobility: Needs Assistance Bed Mobility: Supine to Sit, Sit to Supine     Supine to sit: Supervision     General bed mobility comments: Pt able to maneuver to edge of bed without physical assist    Transfers Overall transfer level: Needs assistance Equipment used: Rolling walker (2 wheels) Transfers: Sit to/from Stand Sit to Stand: Supervision           General transfer comment:  (use of UE's to raise from seated position)    Ambulation/Gait Ambulation/Gait assistance: Min guard Gait Distance (Feet): 120 Feet Assistive device: Rolling walker (2 wheels) Gait Pattern/deviations: Decreased step length - right, Decreased step length - left, Trunk flexed, Drifts right/left, Narrow base of support Gait velocity: decreased     General Gait Details:  (Pt stood upright throughout gait, occassional assistance for proper hand placement)   Stairs             Wheelchair Mobility    Modified Rankin (Stroke Patients Only)       Balance Overall balance assessment: Needs assistance Sitting-balance support: Feet supported Sitting balance-Leahy Scale: Good Sitting balance - Comments: Able to reach for objects on floor without LOB Postural control: Posterior lean Standing balance support: During functional activity Standing balance-Leahy Scale: Poor Standing balance comment: high fall risk due to poor safety awareness and poor insight of his deficits                            Cognition Arousal/Alertness: Awake/alert Behavior During Therapy: Flat affect Overall Cognitive Status: No family/caregiver present to determine baseline cognitive  functioning                                 General Comments: Pt is alert but  only oriented to self        Exercises      General Comments General comments (skin integrity, edema, etc.):  (pt easily redirected to task this date)      Pertinent Vitals/Pain Pain Assessment Pain Assessment: No/denies pain    Home Living                          Prior Function            PT Goals (current goals can now be found in the care plan section) Acute Rehab PT Goals Patient Stated Goal: none stated Progress towards PT goals: Progressing toward goals    Frequency    Min 2X/week      PT Plan Current plan remains appropriate    Co-evaluation              AM-PAC PT "6 Clicks" Mobility   Outcome Measure  Help needed turning from your back to your side while in a flat bed without using bedrails?: A Little Help needed moving from lying on your back to sitting on the side of a flat bed without using bedrails?: A Little Help needed moving to and from a bed to a chair (including a wheelchair)?: A Little Help needed standing up from a chair using your arms (e.g., wheelchair or bedside chair)?: A Little Help needed to walk in hospital room?: A Little Help needed climbing 3-5 steps with a railing? : A Lot 6 Click Score: 17    End of Session Equipment Utilized During Treatment: Gait belt Activity Tolerance: Patient tolerated treatment well Patient left: in chair;with call bell/phone within reach;with nursing/sitter in room Nurse Communication: Mobility status PT Visit Diagnosis: Unsteadiness on feet (R26.81);Muscle weakness (generalized) (M62.81);Other abnormalities of gait and mobility (R26.89)     Time: 1610-9604 PT Time Calculation (min) (ACUTE ONLY): 24 min  Charges:  $Gait Training: 8-22 mins $Therapeutic Activity: 8-22 mins                    Zadie Cleverly, PTA  Jannet Askew 09/18/2022, 11:59 AM

## 2022-09-19 ENCOUNTER — Ambulatory Visit: Payer: Medicare HMO | Admitting: Urology

## 2022-09-19 DIAGNOSIS — F03918 Unspecified dementia, unspecified severity, with other behavioral disturbance: Secondary | ICD-10-CM | POA: Diagnosis not present

## 2022-09-19 NOTE — Progress Notes (Signed)
Mobility Specialist - Progress Note   09/19/22 1616  Mobility  Activity Ambulated with assistance in hallway  Level of Assistance Contact guard assist, steadying assist  Assistive Device Front wheel walker  Distance Ambulated (ft) 180 ft  Activity Response Tolerated well  $Mobility charge 1 Mobility   Pt sitting in recliner upon entry, utilizing RA. Pt STS to RW MinA, and amb MinA-CGA. Pt amb 280 ft around the NS with chair follow for safety. Pt required min redirection to task and manual manipulation of the RW throughout session. Pt stopped at approximately 280 ft and decided to sit in recliner, wheeled back into room and left with needs within reach. Sitter present at bedside.  Zetta Bills Mobility Specialist 09/19/22 4:40 PM

## 2022-09-19 NOTE — [place] FL2 (Signed)
[place] MEDICAID FL2 LEVEL OF CARE FORM     IDENTIFICATION  Patient Name: Terry Sloan Birthdate: 23-Sep-1940 Sex: male Admission Date (Current Location): 08/08/2022  [facility] and [place] Number:  Chiropodist and Address:  [facility], [address], [place], [place] [address]      Provider Number: [id number]  Attending Physician Name and Address:  Tresa Moore, MD  Relative Name and Phone Number:  Annia Belt Daughter   [phone]  Travarius, Lange   [phone]    Current Level of Care: Hospital Recommended Level of Care: Skilled Nursing Facility Prior Approval Number:    Date Approved/Denied:   PASRR Number: [id number]  Discharge Plan: SNF    Current Diagnoses: Patient Active Problem List   Diagnosis Date Noted   Vitamin B12 deficiency 09/12/2022   Altered mental status 09/06/2022   Hypothermia 09/06/2022   Restless leg 09/06/2022   Dementia with behavioral disturbance 07/27/2022   OAB (overactive bladder) 01/25/2019   BPH (benign prostatic hyperplasia) 07/27/2018   History of total left knee replacement 04/17/2017   History of TIA (transient ischemic attack) 10/16/2016   Aphasia 10/16/2016   Anxiety and depression 10/16/2016   PVD (peripheral vascular disease) 09/23/2016   Mixed hyperlipidemia 09/23/2016   Weight loss, unintentional 09/23/2016    Orientation RESPIRATION BLADDER Height & Weight     Self  Normal Incontinent Weight: 172 lb 6.4 oz (78.2 kg) Height:   (177.8 cm)  BEHAVIORAL SYMPTOMS/MOOD NEUROLOGICAL BOWEL NUTRITION STATUS      Incontinent Diet  AMBULATORY STATUS COMMUNICATION OF NEEDS Skin   Limited Assist Verbally Normal                       Personal Care Assistance Level of Assistance  Bathing, Feeding, Dressing Bathing Assistance: Limited assistance Feeding assistance: Limited assistance Dressing Assistance: Limited assistance     Functional Limitations Info  Sight,  Hearing, Speech Sight Info: Adequate Hearing Info: Adequate Speech Info: Adequate    SPECIAL CARE FACTORS FREQUENCY  PT (By licensed PT), OT (By licensed OT)     PT Frequency: Minimum 5x a week OT Frequency: Minimum 5x a week            Contractures Contractures Info: Not present    Additional Factors Info  Code Status, Allergies, Psychotropic Code Status Info: Full Code Allergies Info: Celebrex (Celecoxib) Psychotropic Info: divalproex (DEPAKOTE SPRINKLE) capsule 250 mg, QUEtiapine (SEROQUEL) tablet 25 mg         Current Medications (09/19/2022):  This is the current hospital active medication list Current Facility-Administered Medications  Medication Dose Route Frequency Provider Last Rate Last Admin   acetaminophen (TYLENOL) tablet 650 mg  650 mg Oral Q6H PRN Bennett, Christal H, NP   650 mg at 09/19/22 0932   divalproex (DEPAKOTE SPRINKLE) capsule 250 mg  250 mg Oral Q12H Jaynie Bream, RPH   250 mg at 09/19/22 0935   donepezil (ARICEPT) tablet 5 mg  5 mg Oral Daily Cox, Amy N, DO   5 mg at 09/19/22 0931   enoxaparin (LOVENOX) injection 40 mg  40 mg Subcutaneous Q24H Cox, Amy N, DO   40 mg at 09/18/22 2104   feeding supplement (ENSURE ENLIVE / ENSURE PLUS) liquid 237 mL  237 mL Oral BID BM Cox, Amy N, DO   237 mL at 09/19/22 0935   haloperidol (HALDOL) tablet 5 mg  5 mg Oral BID PRN Hampton Abbot, NP  Or   haloperidol lactate (HALDOL) injection 5 mg  5 mg Intramuscular BID PRN Bennett, Christal H, NP       pravastatin (PRAVACHOL) tablet 40 mg  40 mg Oral QHS Cox, Amy N, DO   40 mg at 09/18/22 2058   QUEtiapine (SEROQUEL) tablet 25 mg  25 mg Oral BID Bennett, Christal H, NP   25 mg at 09/19/22 0931   rOPINIRole (REQUIP) tablet 0.5 mg  0.5 mg Oral QHS Bennett, Christal H, NP   0.5 mg at 09/18/22 2058   tamsulosin (FLOMAX) capsule 0.4 mg  0.4 mg Oral QPC supper Cox, Amy N, DO   0.4 mg at 09/18/22 1753   traZODone (DESYREL) tablet 25 mg  25 mg Oral QHS PRN  Willeen Cass, Christal H, NP   25 mg at 09/18/22 2059     Discharge Medications: Please see discharge summary for a list of discharge medications.  Relevant Imaging Results:  Relevant Lab Results:   Additional Information SSN [id number]  Darleene Cleaver, LCSW

## 2022-09-19 NOTE — Progress Notes (Addendum)
Mobility Specialist - Progress Note   09/19/22 1352  Mobility  Activity Ambulated independently in hallway  Level of Assistance Contact guard assist, steadying assist  Assistive Device Front wheel walker  Distance Ambulated (ft) 170 ft  Activity Response Tolerated well  Mobility Referral Yes  $Mobility charge 1 Mobility   Pt sitting in recliner on RA upon arrival. Pt agreeable to ambulation. Pt STS and ambulates 1 lap around NS CGA with a chair follow for safety. Pt needs VC for task initiation and RW safety throughout. Pt returns to bed with needs in reach and sitter present.   Terrilyn Saver  Mobility Specialist  09/19/22 1:55 PM

## 2022-09-19 NOTE — Progress Notes (Signed)
Patient become increasingly agitated and having difficulty following commands. Not able to be re-directed at this time. PRN oral haldol given, along with juice. Patient also taken for brief walk with mobility team to held reduce energy levels.

## 2022-09-19 NOTE — Progress Notes (Signed)
PROGRESS NOTE    Terry Sloan  JYN:[id number] DOB: August 08, 1940 DOA: 08/08/2022 PCP: Pcp, No    Brief Narrative:  82 year old male is a 82 year old male with history of dementia, behavioral disturbance, hyperlipidemia, BPH, insomnia, depression, anxiety, who presents to emergency department from [facility] rehab dementia department on 08/08/2022 for chief concerns of aggression with aggressive outburst towards staff.   Patient was initially IVC.  Behavioral health was consulted and determined that patient was not a danger to self or others.  Patient was pending placement, however was declined due to as needed Haldol doses were given.   On day of admission, nursing noticed that patient's temperature was low, decrease responsiveness/change in mentation, and hypotension.   Serum sodium on day of admission was 139, potassium 4.0, chloride 109, bicarb 24, BUN of 25, serum creatinine 1.13, eGFR greater than 60, nonfasting blood glucose 92, WBC 5.8, hemoglobin 13.8, platelets of 259.   Blood cultures, are in process.  Procalcitonin was less than 0.10, lactic acid was within normal limits.  Magnesium level was 2.1.  UA was negative for leukocytes and nitrates.   Portable chest x-ray was read as rotated portable exam with lower lung volumes.  CT of the head without contrast: Was read as no evidence of acute intracranial abnormality.  Redemonstrated chronic cortical/subcortical infarcts within the right frontal and left occipital lobes.  Redemonstrated chronic infarct within the left cerebellar hemisphere.   Per EDP patient has not required one-to-one sitter over the last few days.  4/6: Patient admitted to the hospitalist service.  Hypothermia and decreased level of responsiveness of completely resolved  4/8: Patient had some agitation last night.  Required IM Geodon x 1 4/9: 2 family members at bedside this morning.  Relayed some of the barriers to discharge and the concern regarding difficulty finding a  disposition plan. 4/17: Appreciate psychiatry recommendations and adjustment of medications   Assessment & Plan:   Principal Problem:   Dementia with behavioral disturbance Active Problems:   Mixed hyperlipidemia   History of TIA (transient ischemic attack)   Anxiety and depression   BPH (benign prostatic hyperplasia)   OAB (overactive bladder)   Altered mental status   Hypothermia   Restless leg   Vitamin B12 deficiency  Altered mental status With hypotension and hypothermia, now resolved Ammonia normal Plan: Continue bedside sitter Delirium precautions Appreciate psychiatry follow-up  Restless leg Requip trial 0.5 mg nightly   Hypothermia Resolved   Dementia with behavioral disturbance Appreciate psychiatric regimen as dictated by psychiatry service   BPH (benign prostatic hyperplasia) Flomax 0.4 mg nightly   DVT prophylaxis: Lovenox Code Status: Full Family Communication: 2 family members at bedside 4/9 Disposition Plan: Status is: Inpatient Remains inpatient appropriate because: Unsafe discharge plan   Level of care: Telemetry Medical  Consultants:  None  Procedures:  None  Antimicrobials: None   Subjective: Seen and examined.  Physical therapy.  Mittens in place.  Sitter at bedside.  Objective: Vitals:   09/17/22 1908 09/18/22 0721 09/18/22 1923 09/19/22 0642  BP: 99/74 (!) 88/59 111/63 118/66  Pulse: 90 73 (!) 58 69  Resp: Temp: 99.2 F (37.3 C) 98 F (36.7 C) (!) 97 F (36.1 C) 97.9 F (36.6 C)  TempSrc:      SpO2: 99% 96% 98% 98%  Weight:      Height:        Intake/Output Summary (Last 24 hours) at 09/19/2022 1322 Last data filed at 09/19/2022 0532  Gross per 24 hour  Intake --  Output 1250 ml  Net -1250 ml   Filed Weights   08/08/22 1205 08/14/22 0038 09/07/22 0057  Weight: 81 kg 78.2 kg 78.2 kg    Examination:  General exam: No acute distress Respiratory system: Clear to auscultation. Respiratory effort  normal. Cardiovascular system: S1-S2, RRR, no murmurs, no pedal edema Gastrointestinal system: Soft, NT/ND, normal bowel sounds Central nervous system: Awake, oriented x 2, no focal deficits Extremities: Symmetric 5 x 5 power. Skin: No rashes, lesions or ulcers Psychiatry: Judgement and insight appear impaired. Mood & affect confused.     Data Reviewed: I have personally reviewed following labs and imaging studies  CBC: Recent Labs  Lab 09/15/22 0440  WBC 6.3  NEUTROABS 4.2  HGB 11.1*  HCT 34.6*  MCV 95.1  PLT 237   Basic Metabolic Panel: Recent Labs  Lab 09/15/22 0440  NA 139  K 3.8  CL 107  CO2 26  GLUCOSE 91  BUN 30*  CREATININE 1.09  CALCIUM 8.5*  MG 2.1  PHOS 4.1   GFR: Estimated Creatinine Clearance: 53 mL/min (by C-G formula based on SCr of 1.09 mg/dL). Liver Function Tests: No results for input(s): "AST", "ALT", "ALKPHOS", "BILITOT", "PROT", "ALBUMIN" in the last 168 hours. No results for input(s): "LIPASE", "AMYLASE" in the last 168 hours. No results for input(s): "AMMONIA" in the last 168 hours.  Coagulation Profile: No results for input(s): "INR", "PROTIME" in the last 168 hours. Cardiac Enzymes: No results for input(s): "CKTOTAL", "CKMB", "CKMBINDEX", "TROPONINI" in the last 168 hours. BNP (last 3 results) No results for input(s): "PROBNP" in the last 8760 hours. HbA1C: No results for input(s): "HGBA1C" in the last 72 hours. CBG: No results for input(s): "GLUCAP" in the last 168 hours. Lipid Profile: No results for input(s): "CHOL", "HDL", "LDLCALC", "TRIG", "CHOLHDL", "LDLDIRECT" in the last 72 hours. Thyroid Function Tests: No results for input(s): "TSH", "T4TOTAL", "FREET4", "T3FREE", "THYROIDAB" in the last 72 hours.  Anemia Panel: No results for input(s): "VITAMINB12", "FOLATE", "FERRITIN", "TIBC", "IRON", "RETICCTPCT" in the last 72 hours.  Sepsis Labs: No results for input(s): "PROCALCITON", "LATICACIDVEN" in the last 168  hours.   No results found for this or any previous visit (from the past 240 hour(s)).        Radiology Studies: No results found.      Scheduled Meds:  divalproex  250 mg Oral Q12H   donepezil  5 mg Oral Daily   enoxaparin (LOVENOX) injection  40 mg Subcutaneous Q24H   feeding supplement  237 mL Oral BID BM   pravastatin  40 mg Oral QHS   QUEtiapine  25 mg Oral BID   rOPINIRole  0.5 mg Oral QHS   tamsulosin  0.4 mg Oral QPC supper   Continuous Infusions:   LOS: 13 days     Tresa Moore, MD [facility]   If 7PM-7AM, please contact night-coverage  09/19/2022, 1:22 PM

## 2022-09-19 NOTE — TOC Progression Note (Signed)
Transition of Care [facility]) - Progression Note    Patient Details  Name: Terry Sloan MRN: [id number] Date of Birth: 1940/12/17  Transition of Care [facility]) CM/SW Contact  Darleene Cleaver, [place] Phone Number: 09/19/2022, 10:00 PM  Clinical Narrative:     Spoke to patient's daughter, she said [facility] DSS needs a copy of signed FL2.  TOC to send requested information to DSS tomorrow.  Patient has been refaxed out to SNFs to review patient, awaiting bed offers.  Expected Discharge Plan:  (TBD) Barriers to Discharge: Continued Medical Work up  Expected Discharge Plan and Services  SNF                                             Social Determinants of Health (SDOH) Interventions SDOH Screenings   Food Insecurity: Patient Unable To Answer (09/07/2022)  Housing: Low Risk  (09/07/2022)  Transportation Needs: Patient Unable To Answer (09/07/2022)  Utilities: Not At Risk (07/23/2022)  Alcohol Screen: Low Risk  (07/23/2022)  Depression (PHQ2-9): Low Risk  (07/23/2022)  Recent Concern: Depression (PHQ2-9) - Medium Risk (06/17/2022)  Financial Resource Strain: Low Risk  (07/23/2022)  Physical Activity: Insufficiently Active (07/23/2022)  Social Connections: Socially Isolated (07/23/2022)  Stress: No Stress Concern Present (07/23/2022)  Tobacco Use: Medium Risk (09/11/2022)    Readmission Risk Interventions     No data to display

## 2022-09-20 DIAGNOSIS — F03918 Unspecified dementia, unspecified severity, with other behavioral disturbance: Secondary | ICD-10-CM | POA: Diagnosis not present

## 2022-09-20 MED ORDER — ZIPRASIDONE MESYLATE 20 MG IM SOLR
10.0000 mg | Freq: Once | INTRAMUSCULAR | Status: AC
  Administered 2022-09-20: 10 mg via INTRAMUSCULAR
  Filled 2022-09-20: qty 20

## 2022-09-20 MED ORDER — QUETIAPINE FUMARATE 25 MG PO TABS
25.0000 mg | ORAL_TABLET | Freq: Every day | ORAL | Status: DC
  Administered 2022-09-20 – 2022-09-21 (×2): 25 mg via ORAL
  Filled 2022-09-20 (×2): qty 1

## 2022-09-20 MED ORDER — QUETIAPINE FUMARATE 25 MG PO TABS
50.0000 mg | ORAL_TABLET | Freq: Every day | ORAL | Status: DC
  Administered 2022-09-20 – 2022-09-23 (×4): 50 mg via ORAL
  Filled 2022-09-20 (×4): qty 2

## 2022-09-20 NOTE — Progress Notes (Signed)
Physical Therapy Treatment Patient Details Name: Terry Sloan MRN: [id number] DOB: 22-Jul-1940 Today's Date: 09/20/2022   History of Present Illness 82 year old male is a 82 year old male with history of dementia, behavioral disturbance, hyperlipidemia, BPH, insomnia, depression, anxiety, who presented to emergency department from [facility] rehab dementia department on 08/08/2022 for chief concerns of aggression with aggressive outburst towards staff. On day of admission, nursing noticed that patient's temperature was low, decrease responsiveness/change in mentation, and hypotension.    PT Comments    Pt received in bed keeping self busy with busy apron. Pt agreed to OOB activity, able to follow simple one step commands without difficulty. Good tolerance for gait training in hall with support of RW, CGA/S x 180+ft. Pt very pleasant throughout session. Will continue to progress mobility per POC until cleared for d/c.   Recommendations for follow up therapy are one component of a multi-disciplinary discharge planning process, led by the attending physician.  Recommendations may be updated based on patient status, additional functional criteria and insurance authorization.  Follow Up Recommendations  Can patient physically be transported by private vehicle: Yes    Assistance Recommended at Discharge Frequent or constant Supervision/Assistance  Patient can return home with the following A little help with walking and/or transfers;A lot of help with bathing/dressing/bathroom;Assistance with cooking/housework;Assistance with feeding;Direct supervision/assist for medications management;Direct supervision/assist for financial management;Assist for transportation;Help with stairs or ramp for entrance   Equipment Recommendations  Other (comment) (TBD at next facility)    Recommendations for Other Services       Precautions / Restrictions Precautions Precautions: Fall Restrictions Weight Bearing  Restrictions: No     Mobility  Bed Mobility Overal bed mobility: Needs Assistance Bed Mobility: Supine to Sit, Sit to Supine     Supine to sit: Supervision Sit to supine: Min assist   General bed mobility comments: Pt able to maneuver to edge of bed without physical assist    Transfers Overall transfer level: Needs assistance Equipment used: Rolling walker (2 wheels) Transfers: Sit to/from Stand Sit to Stand: Supervision           General transfer comment:  (Pt able to stand with single cue and RW in front)    Ambulation/Gait Ambulation/Gait assistance: Min guard Gait Distance (Feet):  (180) Assistive device: Rolling walker (2 wheels) Gait Pattern/deviations: Decreased step length - right, Decreased step length - left, Trunk flexed, Drifts right/left, Narrow base of support Gait velocity: decreased     General Gait Details: pt has poor standing posture throughout all standing activity and during gait training. He required occasional min assist for navigating RW. several standing rest however overall tolerated well   Stairs             Wheelchair Mobility    Modified Rankin (Stroke Patients Only)       Balance Overall balance assessment: Needs assistance Sitting-balance support: Feet supported Sitting balance-Leahy Scale: Good Sitting balance - Comments: Able to reach for objects on floor without LOB   Standing balance support: During functional activity, Reliant on assistive device for balance Standing balance-Leahy Scale: Fair Standing balance comment: high fall risk due to poor safety awareness and poor insight of his deficits                            Cognition Arousal/Alertness: Awake/alert Behavior During Therapy: Flat affect Overall Cognitive Status: No family/caregiver present to determine baseline cognitive functioning  General Comments: Pt is alert but only oriented to self, able  to engage in conversation        Exercises      General Comments General comments (skin integrity, edema, etc.): Purewick remains attached due to cognitive deficits      Pertinent Vitals/Pain Pain Assessment Pain Assessment: No/denies pain    Home Living                          Prior Function            PT Goals (current goals can now be found in the care plan section) Acute Rehab PT Goals Patient Stated Goal: none stated    Frequency    Min 2X/week      PT Plan Current plan remains appropriate    Co-evaluation              AM-PAC PT "6 Clicks" Mobility   Outcome Measure  Help needed turning from your back to your side while in a flat bed without using bedrails?: A Little Help needed moving from lying on your back to sitting on the side of a flat bed without using bedrails?: A Little Help needed moving to and from a bed to a chair (including a wheelchair)?: A Little Help needed standing up from a chair using your arms (e.g., wheelchair or bedside chair)?: A Little Help needed to walk in hospital room?: A Little Help needed climbing 3-5 steps with a railing? : A Lot 6 Click Score: 17    End of Session Equipment Utilized During Treatment: Gait belt Activity Tolerance: Patient tolerated treatment well Patient left: in chair;with call bell/phone within reach;with nursing/sitter in room Nurse Communication: Mobility status PT Visit Diagnosis: Unsteadiness on feet (R26.81);Muscle weakness (generalized) (M62.81);Other abnormalities of gait and mobility (R26.89)     Time: 3664-4034 PT Time Calculation (min) (ACUTE ONLY): 21 min  Charges:                       Zadie Cleverly, PTA  Jannet Askew 09/20/2022, 2:49 PM

## 2022-09-20 NOTE — TOC Progression Note (Addendum)
Transition of Care [facility] - Progression Note    Patient Details  Name: Terry Sloan MRN: [id number] Date of Birth: 10-22-1940  [facility]) CM/SW Contact  Darleene Cleaver, [place] Phone Number: 09/20/2022, 5:39 PM  Clinical Narrative:    Angelica Pou to [facility] DSS, spoke to [place], she is no longer his Financial controller.  Patient was initially denied by Medicaid due to some missing documentation.  Family will have to resubmit Medicaid Application    Expected Discharge Plan:  (TBD) Barriers to Discharge: Continued Medical Work up  Expected Discharge Plan and Services  SNF                                             Social Determinants of Health (SDOH) Interventions SDOH Screenings   Food Insecurity: Patient Unable To Answer (09/07/2022)  Housing: Low Risk  (09/07/2022)  Transportation Needs: Patient Unable To Answer (09/07/2022)  Utilities: Not At Risk (07/23/2022)  Alcohol Screen: Low Risk  (07/23/2022)  Depression (PHQ2-9): Low Risk  (07/23/2022)  Recent Concern: Depression (PHQ2-9) - Medium Risk (06/17/2022)  Financial Resource Strain: Low Risk  (07/23/2022)  Physical Activity: Insufficiently Active (07/23/2022)  Social Connections: Socially Isolated (07/23/2022)  Stress: No Stress Concern Present (07/23/2022)  Tobacco Use: Medium Risk (09/11/2022)    Readmission Risk Interventions     No data to display

## 2022-09-20 NOTE — Progress Notes (Addendum)
Mobility Specialist - Progress Note   09/20/22 1633  Mobility  Activity Ambulated with assistance in hallway  Level of Assistance Standby assist, set-up cues, supervision of patient - no hands on  Assistive Device Front wheel walker  Distance Ambulated (ft) 340 ft  Activity Response Tolerated well  $Mobility charge 1 Mobility   Pt sitting in the recliner upon entry, utilizing RA. Pt STS to RW MinA and amb CGA-SBA. Pt amb two laps around the NS, requiring Max VCs for hand placement and manual manipulation of thye RW. Pt returned to the room, left sitting in recliner with sitter present.   Zetta Bills Mobility Specialist 09/20/22 4:34 PM

## 2022-09-20 NOTE — Progress Notes (Signed)
Pt is agitated trying to get out of, yelling out for his mother, crying. Highly restless. PRN haldol given at 0433. MD notified. One time order geodon ordered. See Mar

## 2022-09-20 NOTE — Progress Notes (Addendum)
Patient became agitated and restless this shift, constantly trying to get out of bed, throwing legs over side rail, haldol given with improvement. Patient resting at this time. Will continue to monitor.

## 2022-09-20 NOTE — Progress Notes (Signed)
PROGRESS NOTE    Terry Sloan  ZOX:[id number] DOB: Oct 02, 1940 DOA: 08/08/2022 PCP: Pcp, No    Brief Narrative:  82 year old male is a 82 year old male with history of dementia, behavioral disturbance, hyperlipidemia, BPH, insomnia, depression, anxiety, who presents to emergency department from [facility] rehab dementia department on 08/08/2022 for chief concerns of aggression with aggressive outburst towards staff.   Patient was initially IVC.  Behavioral health was consulted and determined that patient was not a danger to self or others.  Patient was pending placement, however was declined due to as needed Haldol doses were given.   On day of admission, nursing noticed that patient's temperature was low, decrease responsiveness/change in mentation, and hypotension.   Serum sodium on day of admission was 139, potassium 4.0, chloride 109, bicarb 24, BUN of 25, serum creatinine 1.13, eGFR greater than 60, nonfasting blood glucose 92, WBC 5.8, hemoglobin 13.8, platelets of 259.   Blood cultures, are in process.  Procalcitonin was less than 0.10, lactic acid was within normal limits.  Magnesium level was 2.1.  UA was negative for leukocytes and nitrates.   Portable chest x-ray was read as rotated portable exam with lower lung volumes.  CT of the head without contrast: Was read as no evidence of acute intracranial abnormality.  Redemonstrated chronic cortical/subcortical infarcts within the right frontal and left occipital lobes.  Redemonstrated chronic infarct within the left cerebellar hemisphere.   Per EDP patient has not required one-to-one sitter over the last few days.  4/6: Patient admitted to the hospitalist service.  Hypothermia and decreased level of responsiveness of completely resolved  4/8: Patient had some agitation last night.  Required IM Geodon x 1 4/9: 2 family members at bedside this morning.  Relayed some of the barriers to discharge and the concern regarding difficulty finding a  disposition plan. 4/17: Appreciate psychiatry recommendations and adjustment of medications   Assessment & Plan:   Principal Problem:   Dementia with behavioral disturbance Active Problems:   Mixed hyperlipidemia   History of TIA (transient ischemic attack)   Anxiety and depression   BPH (benign prostatic hyperplasia)   OAB (overactive bladder)   Altered mental status   Hypothermia   Restless leg   Vitamin B12 deficiency  Altered mental status With hypotension and hypothermia, now resolved Ammonia normal Plan: Continue bedside sitter Delirium precautions Appreciate psychiatry follow-up  Restless leg Requip trial 0.5 mg nightly   Hypothermia Resolved   Dementia with behavioral disturbance Appreciate psychiatric regimen as dictated by psychiatry service Required IM Geodon x 1 on 4/19   BPH (benign prostatic hyperplasia) Flomax 0.4 mg nightly   DVT prophylaxis: Lovenox Code Status: Full Family Communication: 2 family members at bedside 4/9 Disposition Plan: Status is: Inpatient Remains inpatient appropriate because: Unsafe discharge plan   Level of care: Telemetry Medical  Consultants:  None  Procedures:  None  Antimicrobials: None   Subjective: Seen and examined.  More agitated this morning  Objective: Vitals:   09/19/22 2011 09/20/22 0529 09/20/22 0717 09/20/22 1244  BP: 125/71 102/66 91/65 113/82  Pulse: 74 77 77 86  Resp: Temp: 98.7 F (37.1 C) 98.1 F (36.7 C) 98.1 F (36.7 C) (!) 97.5 F (36.4 C)  TempSrc:    Oral  SpO2: 98% 98% 100% 99%  Weight:      Height:        Intake/Output Summary (Last 24 hours) at 09/20/2022 1248 Last data filed at 09/20/2022 8119 Gross  per 24 hour  Intake --  Output 750 ml  Net -750 ml   Filed Weights   08/08/22 1205 08/14/22 0038 09/07/22 0057  Weight: 81 kg 78.2 kg 78.2 kg    Examination:  General exam: Agitated Respiratory system: Clear to auscultation. Respiratory effort  normal. Cardiovascular system: S1-S2, RRR, no murmurs, no pedal edema Gastrointestinal system: Soft, NT/ND, normal bowel sounds Central nervous system: Awake, oriented x 2, no focal deficits Extremities: Symmetric 5 x 5 power. Skin: No rashes, lesions or ulcers Psychiatry: Judgement and insight appear impaired. Mood & affect confused.     Data Reviewed: I have personally reviewed following labs and imaging studies  CBC: Recent Labs  Lab 09/15/22 0440  WBC 6.3  NEUTROABS 4.2  HGB 11.1*  HCT 34.6*  MCV 95.1  PLT 237   Basic Metabolic Panel: Recent Labs  Lab 09/15/22 0440  NA 139  K 3.8  CL 107  CO2 26  GLUCOSE 91  BUN 30*  CREATININE 1.09  CALCIUM 8.5*  MG 2.1  PHOS 4.1   GFR: Estimated Creatinine Clearance: 53 mL/min (by C-G formula based on SCr of 1.09 mg/dL). Liver Function Tests: No results for input(s): "AST", "ALT", "ALKPHOS", "BILITOT", "PROT", "ALBUMIN" in the last 168 hours. No results for input(s): "LIPASE", "AMYLASE" in the last 168 hours. No results for input(s): "AMMONIA" in the last 168 hours.  Coagulation Profile: No results for input(s): "INR", "PROTIME" in the last 168 hours. Cardiac Enzymes: No results for input(s): "CKTOTAL", "CKMB", "CKMBINDEX", "TROPONINI" in the last 168 hours. BNP (last 3 results) No results for input(s): "PROBNP" in the last 8760 hours. HbA1C: No results for input(s): "HGBA1C" in the last 72 hours. CBG: No results for input(s): "GLUCAP" in the last 168 hours. Lipid Profile: No results for input(s): "CHOL", "HDL", "LDLCALC", "TRIG", "CHOLHDL", "LDLDIRECT" in the last 72 hours. Thyroid Function Tests: No results for input(s): "TSH", "T4TOTAL", "FREET4", "T3FREE", "THYROIDAB" in the last 72 hours.  Anemia Panel: No results for input(s): "VITAMINB12", "FOLATE", "FERRITIN", "TIBC", "IRON", "RETICCTPCT" in the last 72 hours.  Sepsis Labs: No results for input(s): "PROCALCITON", "LATICACIDVEN" in the last 168  hours.   No results found for this or any previous visit (from the past 240 hour(s)).        Radiology Studies: No results found.      Scheduled Meds:  divalproex  250 mg Oral Q12H   donepezil  5 mg Oral Daily   enoxaparin (LOVENOX) injection  40 mg Subcutaneous Q24H   feeding supplement  237 mL Oral BID BM   pravastatin  40 mg Oral QHS   QUEtiapine  25 mg Oral Daily   QUEtiapine  50 mg Oral QHS   rOPINIRole  0.5 mg Oral QHS   tamsulosin  0.4 mg Oral QPC supper   Continuous Infusions:   LOS: 14 days     Tresa Moore, MD [facility]   If 7PM-7AM, please contact night-coverage  09/20/2022, 12:48 PM

## 2022-09-21 DIAGNOSIS — F03918 Unspecified dementia, unspecified severity, with other behavioral disturbance: Secondary | ICD-10-CM | POA: Diagnosis not present

## 2022-09-21 MED ORDER — TRAZODONE HCL 50 MG PO TABS
50.0000 mg | ORAL_TABLET | Freq: Every evening | ORAL | Status: DC | PRN
  Administered 2022-09-24 – 2022-09-26 (×3): 50 mg via ORAL
  Filled 2022-09-21 (×3): qty 1

## 2022-09-21 MED ORDER — ZIPRASIDONE MESYLATE 20 MG IM SOLR
10.0000 mg | Freq: Once | INTRAMUSCULAR | Status: AC
  Administered 2022-09-24: 10 mg via INTRAMUSCULAR
  Filled 2022-09-21: qty 20

## 2022-09-21 MED ORDER — QUETIAPINE FUMARATE 25 MG PO TABS
50.0000 mg | ORAL_TABLET | Freq: Every day | ORAL | Status: DC
  Administered 2022-09-22 – 2022-09-24 (×4): 50 mg via ORAL
  Filled 2022-09-21 (×3): qty 2

## 2022-09-21 NOTE — Progress Notes (Signed)
PROGRESS NOTE    Terry Sloan  ZOX:[id number] DOB: 07-21-1940 DOA: 08/08/2022 PCP: Pcp, No    Brief Narrative:  82 year old male is a 82 year old male with history of dementia, behavioral disturbance, hyperlipidemia, BPH, insomnia, depression, anxiety, who presents to emergency department from [facility] rehab dementia department on 08/08/2022 for chief concerns of aggression with aggressive outburst towards staff.   Patient was initially IVC.  Behavioral health was consulted and determined that patient was not a danger to self or others.  Patient was pending placement, however was declined due to as needed Haldol doses were given.   On day of admission, nursing noticed that patient's temperature was low, decrease responsiveness/change in mentation, and hypotension.   Serum sodium on day of admission was 139, potassium 4.0, chloride 109, bicarb 24, BUN of 25, serum creatinine 1.13, eGFR greater than 60, nonfasting blood glucose 92, WBC 5.8, hemoglobin 13.8, platelets of 259.   Blood cultures, are in process.  Procalcitonin was less than 0.10, lactic acid was within normal limits.  Magnesium level was 2.1.  UA was negative for leukocytes and nitrates.   Portable chest x-ray was read as rotated portable exam with lower lung volumes.  CT of the head without contrast: Was read as no evidence of acute intracranial abnormality.  Redemonstrated chronic cortical/subcortical infarcts within the right frontal and left occipital lobes.  Redemonstrated chronic infarct within the left cerebellar hemisphere.   Per EDP patient has not required one-to-one sitter over the last few days.  4/6: Patient admitted to the hospitalist service.  Hypothermia and decreased level of responsiveness of completely resolved  4/8: Patient had some agitation last night.  Required IM Geodon x 1 4/9: 2 family members at bedside this morning.  Relayed some of the barriers to discharge and the concern regarding difficulty finding a  disposition plan. 4/17: Appreciate psychiatry recommendations and adjustment of medications   Assessment & Plan:   Principal Problem:   Dementia with behavioral disturbance Active Problems:   Mixed hyperlipidemia   History of TIA (transient ischemic attack)   Anxiety and depression   BPH (benign prostatic hyperplasia)   OAB (overactive bladder)   Altered mental status   Hypothermia   Restless leg   Vitamin B12 deficiency  Altered mental status With hypotension and hypothermia, now resolved Ammonia normal Plan: Continue bedside sitter Delirium precautions Appreciate psychiatry follow-up and medication recommendations  Restless leg Requip trial 0.5 mg nightly   Hypothermia Resolved   Dementia with behavioral disturbance Appreciate psychiatric regimen as dictated by psychiatry service Required IM Geodon  x 1 on 4/19   BPH (benign prostatic hyperplasia) Flomax 0.4 mg nightly   DVT prophylaxis: Lovenox Code Status: Full Family Communication: 2 family members at bedside 4/9 Disposition Plan: Status is: Inpatient Remains inpatient appropriate because: Unsafe discharge plan   Level of care: Telemetry Medical  Consultants:  None  Procedures:  None  Antimicrobials: None   Subjective: Seen and examined.  Calm this AM.  Did not require any prn anti-psychotics  Objective: Vitals:   09/20/22 1244 09/20/22 1619 09/20/22 1918 09/21/22 0908  BP: 113/82 110/78 113/74 123/62  Pulse: 86 85  74  Resp: Temp: (!) 97.5 F (36.4 C) (!) 97.5 F (36.4 C) 97.7 F (36.5 C) 97.7 F (36.5 C)  TempSrc: Oral Oral    SpO2: 99% 97% 98% 97%  Weight:      Height:       No intake or output data  in the 24 hours ending 09/21/22 1214  Filed Weights   08/08/22 1205 08/14/22 0038 09/07/22 0057  Weight: 81 kg 78.2 kg 78.2 kg    Examination:  General exam: NAD, appears frail Respiratory system: Clear to auscultation. Respiratory effort  normal. Cardiovascular system: S1-S2, RRR, no murmurs, no pedal edema Gastrointestinal system: Soft, NT/ND, normal bowel sounds Central nervous system: Awake, oriented x 2, no focal deficits Extremities: Symmetric 5 x 5 power. Skin: No rashes, lesions or ulcers Psychiatry: Judgement and insight appear impaired. Mood & affect confused.     Data Reviewed: I have personally reviewed following labs and imaging studies  CBC: Recent Labs  Lab 09/15/22 0440  WBC 6.3  NEUTROABS 4.2  HGB 11.1*  HCT 34.6*  MCV 95.1  PLT 237   Basic Metabolic Panel: Recent Labs  Lab 09/15/22 0440  NA 139  K 3.8  CL 107  CO2 26  GLUCOSE 91  BUN 30*  CREATININE 1.09  CALCIUM 8.5*  MG 2.1  PHOS 4.1   GFR: Estimated Creatinine Clearance: 53 mL/min (by C-G formula based on SCr of 1.09 mg/dL). Liver Function Tests: No results for input(s): "AST", "ALT", "ALKPHOS", "BILITOT", "PROT", "ALBUMIN" in the last 168 hours. No results for input(s): "LIPASE", "AMYLASE" in the last 168 hours. No results for input(s): "AMMONIA" in the last 168 hours.  Coagulation Profile: No results for input(s): "INR", "PROTIME" in the last 168 hours. Cardiac Enzymes: No results for input(s): "CKTOTAL", "CKMB", "CKMBINDEX", "TROPONINI" in the last 168 hours. BNP (last 3 results) No results for input(s): "PROBNP" in the last 8760 hours. HbA1C: No results for input(s): "HGBA1C" in the last 72 hours. CBG: No results for input(s): "GLUCAP" in the last 168 hours. Lipid Profile: No results for input(s): "CHOL", "HDL", "LDLCALC", "TRIG", "CHOLHDL", "LDLDIRECT" in the last 72 hours. Thyroid Function Tests: No results for input(s): "TSH", "T4TOTAL", "FREET4", "T3FREE", "THYROIDAB" in the last 72 hours.  Anemia Panel: No results for input(s): "VITAMINB12", "FOLATE", "FERRITIN", "TIBC", "IRON", "RETICCTPCT" in the last 72 hours.  Sepsis Labs: No results for input(s): "PROCALCITON", "LATICACIDVEN" in the last 168  hours.   No results found for this or any previous visit (from the past 240 hour(s)).        Radiology Studies: No results found.      Scheduled Meds:  divalproex  250 mg Oral Q12H   donepezil  5 mg Oral Daily   enoxaparin (LOVENOX) injection  40 mg Subcutaneous Q24H   feeding supplement  237 mL Oral BID BM   pravastatin  40 mg Oral QHS   QUEtiapine  25 mg Oral Daily   QUEtiapine  50 mg Oral QHS   rOPINIRole  0.5 mg Oral QHS   tamsulosin  0.4 mg Oral QPC supper   Continuous Infusions:   LOS: 15 days     Tresa Moore, MD [facility]   If 7PM-7AM, please contact night-coverage  09/21/2022, 12:14 PM

## 2022-09-21 NOTE — Progress Notes (Signed)
Mobility Specialist - Progress Note   09/21/22 1439  Mobility  Activity Ambulated with assistance in hallway  Level of Assistance Contact guard assist, steadying assist  Assistive Device Front wheel walker  Distance Ambulated (ft) 160 ft  Activity Response Tolerated well  Mobility Referral Yes  $Mobility charge 1 Mobility   Pt sitting in recliner on RA upon arrival. Pt agreeable to ambulation. Pt STS and ambulates 1 lap around NS CGA with a chair follow for safety. Pt needs VC for task initiation and RW safety throughout. Pt returns to bed with needs in reach and sitter present.   Terrilyn Saver  Mobility Specialist  09/21/22 2:39 PM

## 2022-09-22 DIAGNOSIS — F03918 Unspecified dementia, unspecified severity, with other behavioral disturbance: Secondary | ICD-10-CM | POA: Diagnosis not present

## 2022-09-22 NOTE — Progress Notes (Signed)
Mobility Specialist - Progress Note   09/22/22 1312  Mobility  Activity Ambulated with assistance in hallway  Level of Assistance Standby assist, set-up cues, supervision of patient - no hands on  Assistive Device Front wheel walker  Distance Ambulated (ft) 160 ft  Activity Response Tolerated well  Mobility Referral Yes  $Mobility charge 1 Mobility   Pt sitting in recliner on RA upon arrival. Pt agreeable to ambulation. Pt STS and ambulates 1 lap around NS SBA with a chair follow for safety. Pt needs VC for task initiation and RW safety throughout. Pt returns to bed with needs in reach and sitter present.   Terrilyn Saver  Mobility Specialist  09/22/22 1:14 PM

## 2022-09-22 NOTE — Progress Notes (Signed)
PROGRESS NOTE    Terry Sloan  RUE:[id number] DOB: 10/14/1940 DOA: 08/08/2022 PCP: Pcp, No    Brief Narrative:  82 year old male is a 82 year old male with history of dementia, behavioral disturbance, hyperlipidemia, BPH, insomnia, depression, anxiety, who presents to emergency department from [facility] rehab dementia department on 08/08/2022 for chief concerns of aggression with aggressive outburst towards staff.   Patient was initially IVC.  Behavioral health was consulted and determined that patient was not a danger to self or others.  Patient was pending placement, however was declined due to as needed Haldol doses were given.   On day of admission, nursing noticed that patient's temperature was low, decrease responsiveness/change in mentation, and hypotension.   Serum sodium on day of admission was 139, potassium 4.0, chloride 109, bicarb 24, BUN of 25, serum creatinine 1.13, eGFR greater than 60, nonfasting blood glucose 92, WBC 5.8, hemoglobin 13.8, platelets of 259.   Blood cultures, are in process.  Procalcitonin was less than 0.10, lactic acid was within normal limits.  Magnesium level was 2.1.  UA was negative for leukocytes and nitrates.   Portable chest x-ray was read as rotated portable exam with lower lung volumes.  CT of the head without contrast: Was read as no evidence of acute intracranial abnormality.  Redemonstrated chronic cortical/subcortical infarcts within the right frontal and left occipital lobes.  Redemonstrated chronic infarct within the left cerebellar hemisphere.   Per EDP patient has not required one-to-one sitter over the last few days.  4/6: Patient admitted to the hospitalist service.  Hypothermia and decreased level of responsiveness of completely resolved  4/8: Patient had some agitation last night.  Required IM Geodon x 1 4/9: 2 family members at bedside this morning.  Relayed some of the barriers to discharge and the concern regarding difficulty finding a  disposition plan. 4/17: Appreciate psychiatry recommendations and adjustment of medications   Assessment & Plan:   Principal Problem:   Dementia with behavioral disturbance Active Problems:   Mixed hyperlipidemia   History of TIA (transient ischemic attack)   Anxiety and depression   BPH (benign prostatic hyperplasia)   OAB (overactive bladder)   Altered mental status   Hypothermia   Restless leg   Vitamin B12 deficiency  Altered mental status With hypotension and hypothermia, now resolved Ammonia normal Plan: Continue bedside sitter Delirium precautions Appreciate psychiatry follow-up and medication recommendations Patient has been stable last 24 to 48 hours  Restless leg Requip trial 0.5 mg nightly   Hypothermia Resolved   Dementia with behavioral disturbance Appreciate psychiatric regimen as dictated by psychiatry service Required IM Geodon  x 1 on 4/19   BPH (benign prostatic hyperplasia) Flomax 0.4 mg nightly   DVT prophylaxis: Lovenox Code Status: Full Family Communication: 2 family members at bedside 4/9 Disposition Plan: Status is: Inpatient Remains inpatient appropriate because: Unsafe discharge plan   Level of care: Telemetry Medical  Consultants:  None  Procedures:  None  Antimicrobials: None   Subjective: Seen and examined.  Calm this AM.  Did not require any prn anti-psychotics  Objective: Vitals:   09/21/22 1524 09/21/22 1925 09/22/22 0626 09/22/22 0935  BP: (!) 96/52 121/74 106/69 108/63  Pulse: 73 77 (!) 52 (!) 56  Resp: Temp: (!) 97.5 F (36.4 C) (!) 97.5 F (36.4 C) (!) 96.4 F (35.8 C) 98.2 F (36.8 C)  TempSrc:  Oral    SpO2: 99% 99% 100% 98%  Weight:  Height:        Intake/Output Summary (Last 24 hours) at 09/22/2022 1214 Last data filed at 09/22/2022 0500 Gross per 24 hour  Intake --  Output 1400 ml  Net -1400 ml    Filed Weights   08/08/22 1205 08/14/22 0038 09/07/22 0057  Weight: 81  kg 78.2 kg 78.2 kg    Examination:  General exam: NAD, appears frail Respiratory system: Clear to auscultation. Respiratory effort normal. Cardiovascular system: S1-S2, RRR, no murmurs, no pedal edema Gastrointestinal system: Soft, NT/ND, normal bowel sounds Central nervous system: Awake, oriented x 2, no focal deficits Extremities: Symmetric 5 x 5 power. Skin: No rashes, lesions or ulcers Psychiatry: Judgement and insight appear impaired. Mood & affect confused.     Data Reviewed: I have personally reviewed following labs and imaging studies  CBC: No results for input(s): "WBC", "NEUTROABS", "HGB", "HCT", "MCV", "PLT" in the last 168 hours.  Basic Metabolic Panel: No results for input(s): "NA", "K", "CL", "CO2", "GLUCOSE", "BUN", "CREATININE", "CALCIUM", "MG", "PHOS" in the last 168 hours.  GFR: Estimated Creatinine Clearance: 53 mL/min (by C-G formula based on SCr of 1.09 mg/dL). Liver Function Tests: No results for input(s): "AST", "ALT", "ALKPHOS", "BILITOT", "PROT", "ALBUMIN" in the last 168 hours. No results for input(s): "LIPASE", "AMYLASE" in the last 168 hours. No results for input(s): "AMMONIA" in the last 168 hours.  Coagulation Profile: No results for input(s): "INR", "PROTIME" in the last 168 hours. Cardiac Enzymes: No results for input(s): "CKTOTAL", "CKMB", "CKMBINDEX", "TROPONINI" in the last 168 hours. BNP (last 3 results) No results for input(s): "PROBNP" in the last 8760 hours. HbA1C: No results for input(s): "HGBA1C" in the last 72 hours. CBG: No results for input(s): "GLUCAP" in the last 168 hours. Lipid Profile: No results for input(s): "CHOL", "HDL", "LDLCALC", "TRIG", "CHOLHDL", "LDLDIRECT" in the last 72 hours. Thyroid Function Tests: No results for input(s): "TSH", "T4TOTAL", "FREET4", "T3FREE", "THYROIDAB" in the last 72 hours.  Anemia Panel: No results for input(s): "VITAMINB12", "FOLATE", "FERRITIN", "TIBC", "IRON", "RETICCTPCT" in the last  72 hours.  Sepsis Labs: No results for input(s): "PROCALCITON", "LATICACIDVEN" in the last 168 hours.   No results found for this or any previous visit (from the past 240 hour(s)).        Radiology Studies: No results found.      Scheduled Meds:  divalproex  250 mg Oral Q12H   donepezil  5 mg Oral Daily   enoxaparin (LOVENOX) injection  40 mg Subcutaneous Q24H   feeding supplement  237 mL Oral BID BM   pravastatin  40 mg Oral QHS   QUEtiapine  50 mg Oral QHS   QUEtiapine  50 mg Oral Daily   rOPINIRole  0.5 mg Oral QHS   tamsulosin  0.4 mg Oral QPC supper   ziprasidone  10 mg Intramuscular Once   Continuous Infusions:   LOS: 16 days     Tresa Moore, MD [facility]   If 7PM-7AM, please contact night-coverage  09/22/2022, 12:14 PM

## 2022-09-23 DIAGNOSIS — F03918 Unspecified dementia, unspecified severity, with other behavioral disturbance: Secondary | ICD-10-CM | POA: Diagnosis not present

## 2022-09-23 MED ORDER — ZIPRASIDONE MESYLATE 20 MG IM SOLR
10.0000 mg | Freq: Once | INTRAMUSCULAR | Status: AC
  Administered 2022-09-23: 10 mg via INTRAMUSCULAR
  Filled 2022-09-23: qty 20

## 2022-09-23 NOTE — Progress Notes (Signed)
RN called to room r/t patient standing and jumping on the bed.  He was yelling and hitting and kicking at staff members trying to assist him.  PRN haldol PO and one time dose IM geodon given once patient was seated in recliner with staff members assisting him.  Patient calmed down but continued to yell and hit at staff members but continued to calm and was given nutritional supplement and pudding.  Patient then laid down in bed with sitter at bedside.

## 2022-09-23 NOTE — Progress Notes (Signed)
Mobility Specialist - Progress Note   09/23/22 1427  Mobility  Activity Ambulated with assistance in hallway  Level of Assistance Contact guard assist, steadying assist  Assistive Device Front wheel walker  Distance Ambulated (ft) 340 ft  Activity Response Tolerated well  $Mobility charge 1 Mobility   Pt sitting in bed upon entry, utilizing RA. Pt transferred to EOB and STS to RW ModI, pt amb two laps around NS CGA-SBA. Pt required max Vcs and manual manipulation to redirect RW-- to prevent running into walls and brushing against floor boards. Pt returned to room, left seated in recliner with sitter present at bedside.   Zetta Bills Mobility Specialist 09/23/22 2:32 PM

## 2022-09-23 NOTE — Progress Notes (Signed)
PROGRESS NOTE    Terry Sloan  ZOX:[id number] DOB: [birth year] DOA: 08/08/2022 PCP: Pcp, No    Brief Narrative:  82 year old male is a 82 year old male with history of dementia, behavioral disturbance, hyperlipidemia, BPH, insomnia, depression, anxiety, who presents to emergency department from [facility] rehab dementia department on 08/08/2022 for chief concerns of aggression with aggressive outburst towards staff.   Patient was initially IVC.  Behavioral health was consulted and determined that patient was not a danger to self or others.  Patient was pending placement, however was declined due to as needed Haldol doses were given.   On day of admission, nursing noticed that patient's temperature was low, decrease responsiveness/change in mentation, and hypotension.   Serum sodium on day of admission was 139, potassium 4.0, chloride 109, bicarb 24, BUN of 25, serum creatinine 1.13, eGFR greater than 60, nonfasting blood glucose 92, WBC 5.8, hemoglobin 13.8, platelets of 259.   Blood cultures, are in process.  Procalcitonin was less than 0.10, lactic acid was within normal limits.  Magnesium level was 2.1.  UA was negative for leukocytes and nitrates.   Portable chest x-ray was read as rotated portable exam with lower lung volumes.  CT of the head without contrast: Was read as no evidence of acute intracranial abnormality.  Redemonstrated chronic cortical/subcortical infarcts within the right frontal and left occipital lobes.  Redemonstrated chronic infarct within the left cerebellar hemisphere.   Per EDP patient has not required one-to-one sitter over the last few days.  4/6: Patient admitted to the hospitalist service.  Hypothermia and decreased level of responsiveness of completely resolved  4/8: Patient had some agitation last night.  Required IM Geodon x 1 4/9: 2 family members at bedside this morning.  Relayed some of the barriers to discharge and the concern regarding difficulty finding a  disposition plan. 4/17: Appreciate psychiatry recommendations and adjustment of medications   Assessment & Plan:   Principal Problem:   Dementia with behavioral disturbance Active Problems:   Mixed hyperlipidemia   History of TIA (transient ischemic attack)   Anxiety and depression   BPH (benign prostatic hyperplasia)   OAB (overactive bladder)   Altered mental status   Hypothermia   Restless leg   Vitamin B12 deficiency  Altered mental status With hypotension and hypothermia, now resolved Ammonia normal Plan: Continue bedside sitter Delirium precautions Appreciate psychiatry follow-up and medication recommendations Patient has been stable last 3 days  Restless leg Requip trial 0.5 mg nightly   Hypothermia Resolved   Dementia with behavioral disturbance Appreciate psychiatric regimen as dictated by psychiatry service Required IM Geodon  x 1 on 4/19   BPH (benign prostatic hyperplasia) Flomax 0.4 mg nightly   DVT prophylaxis: Lovenox Code Status: Full Family Communication: 2 family members at bedside 4/9 Disposition Plan: Status is: Inpatient Remains inpatient appropriate because: Unsafe discharge plan   Level of care: Telemetry Medical  Consultants:  None  Procedures:  None  Antimicrobials: None   Subjective: Seen and examined.  Ambulating with assistance of mobility team  Objective: Vitals:   09/22/22 0935 09/22/22 1610 09/22/22 2200 09/23/22 0719  BP: 108/63 100/65 (!) 97/52 110/62  Pulse: (!) 56 63 76 68  Resp: Temp: 98.2 F (36.8 C) (!) 97.5 F (36.4 C) 98.4 F (36.9 C) 98.4 F (36.9 C)  TempSrc:      SpO2: 98% 98% 99% 92%  Weight:      Height:  Intake/Output Summary (Last 24 hours) at 09/23/2022 1227 Last data filed at 09/23/2022 0300 Gross per 24 hour  Intake --  Output 600 ml  Net -600 ml    Filed Weights   08/08/22 1205 08/14/22 0038 09/07/22 0057  Weight: 81 kg 78.2 kg 78.2 kg     Examination:  General exam: NAD, frail appearing Respiratory system: Clear to auscultation. Respiratory effort normal. Cardiovascular system: S1-S2, RRR, no murmurs, no pedal edema Gastrointestinal system: Soft, NT/ND, normal bowel sounds Central nervous system: Awake, oriented x 2, no focal deficits Extremities: Symmetric 5 x 5 power. Skin: No rashes, lesions or ulcers Psychiatry: Judgement and insight appear impaired. Mood & affect confused.     Data Reviewed: I have personally reviewed following labs and imaging studies  CBC: No results for input(s): "WBC", "NEUTROABS", "HGB", "HCT", "MCV", "PLT" in the last 168 hours.  Basic Metabolic Panel: No results for input(s): "NA", "K", "CL", "CO2", "GLUCOSE", "BUN", "CREATININE", "CALCIUM", "MG", "PHOS" in the last 168 hours.  GFR: Estimated Creatinine Clearance: 53 mL/min (by C-G formula based on SCr of 1.09 mg/dL). Liver Function Tests: No results for input(s): "AST", "ALT", "ALKPHOS", "BILITOT", "PROT", "ALBUMIN" in the last 168 hours. No results for input(s): "LIPASE", "AMYLASE" in the last 168 hours. No results for input(s): "AMMONIA" in the last 168 hours.  Coagulation Profile: No results for input(s): "INR", "PROTIME" in the last 168 hours. Cardiac Enzymes: No results for input(s): "CKTOTAL", "CKMB", "CKMBINDEX", "TROPONINI" in the last 168 hours. BNP (last 3 results) No results for input(s): "PROBNP" in the last 8760 hours. HbA1C: No results for input(s): "HGBA1C" in the last 72 hours. CBG: No results for input(s): "GLUCAP" in the last 168 hours. Lipid Profile: No results for input(s): "CHOL", "HDL", "LDLCALC", "TRIG", "CHOLHDL", "LDLDIRECT" in the last 72 hours. Thyroid Function Tests: No results for input(s): "TSH", "T4TOTAL", "FREET4", "T3FREE", "THYROIDAB" in the last 72 hours.  Anemia Panel: No results for input(s): "VITAMINB12", "FOLATE", "FERRITIN", "TIBC", "IRON", "RETICCTPCT" in the last 72  hours.  Sepsis Labs: No results for input(s): "PROCALCITON", "LATICACIDVEN" in the last 168 hours.   No results found for this or any previous visit (from the past 240 hour(s)).        Radiology Studies: No results found.      Scheduled Meds:  divalproex  250 mg Oral Q12H   donepezil  5 mg Oral Daily   enoxaparin (LOVENOX) injection  40 mg Subcutaneous Q24H   feeding supplement  237 mL Oral BID BM   pravastatin  40 mg Oral QHS   QUEtiapine  50 mg Oral QHS   QUEtiapine  50 mg Oral Daily   rOPINIRole  0.5 mg Oral QHS   tamsulosin  0.4 mg Oral QPC supper   ziprasidone  10 mg Intramuscular Once   Continuous Infusions:   LOS: 17 days     Tresa Moore, MD [facility]   If 7PM-7AM, please contact night-coverage  09/23/2022, 12:27 PM

## 2022-09-24 DIAGNOSIS — F03918 Unspecified dementia, unspecified severity, with other behavioral disturbance: Secondary | ICD-10-CM | POA: Diagnosis not present

## 2022-09-24 MED ORDER — QUETIAPINE FUMARATE 25 MG PO TABS: 100.0000 mg | ORAL_TABLET | Freq: Every day | ORAL | Status: DC

## 2022-09-24 MED ORDER — DIPHENHYDRAMINE HCL 50 MG/ML IJ SOLN: 50.0000 mg | Freq: Once | INTRAMUSCULAR | Status: DC

## 2022-09-24 MED ORDER — QUETIAPINE FUMARATE 100 MG PO TABS
100.0000 mg | ORAL_TABLET | Freq: Every day | ORAL | Status: DC
  Administered 2022-09-24 – 2022-09-28 (×5): 100 mg via ORAL
  Filled 2022-09-24 (×2): qty 4
  Filled 2022-09-24 (×3): qty 1
  Filled 2022-09-24: qty 4
  Filled 2022-09-24: qty 1
  Filled 2022-09-24: qty 4
  Filled 2022-09-24: qty 1

## 2022-09-24 MED ORDER — STERILE WATER FOR INJECTION IJ SOLN
INTRAMUSCULAR | Status: AC
  Administered 2022-09-24: 10 mL
  Filled 2022-09-24: qty 10

## 2022-09-24 MED ORDER — LORAZEPAM 2 MG/ML IJ SOLN: 0.5000 mg | Freq: Once | INTRAMUSCULAR | Status: DC

## 2022-09-24 MED ORDER — QUETIAPINE FUMARATE 25 MG PO TABS
50.0000 mg | ORAL_TABLET | Freq: Two times a day (BID) | ORAL | Status: DC
  Filled 2022-09-24: qty 2

## 2022-09-24 MED ORDER — QUETIAPINE FUMARATE 25 MG PO TABS: 50.0000 mg | ORAL_TABLET | Freq: Two times a day (BID) | ORAL | Status: DC

## 2022-09-24 MED ORDER — DIPHENHYDRAMINE HCL 25 MG PO CAPS: 25.0000 mg | ORAL_CAPSULE | Freq: Once | ORAL | Status: DC

## 2022-09-24 NOTE — Progress Notes (Signed)
PROGRESS NOTE    Terry Sloan  ZOX:[id number] DOB: [birth year] DOA: 08/08/2022 PCP: Pcp, No    Brief Narrative:  82 year old male is a 82 year old male with history of dementia, behavioral disturbance, hyperlipidemia, BPH, insomnia, depression, anxiety, who presents to emergency department from [facility] rehab dementia department on 08/08/2022 for chief concerns of aggression with aggressive outburst towards staff.   Patient was initially IVC.  Behavioral health was consulted and determined that patient was not a danger to self or others.  Patient was pending placement, however was declined due to as needed Haldol doses were given.   On day of admission, nursing noticed that patient's temperature was low, decrease responsiveness/change in mentation, and hypotension.   Serum sodium on day of admission was 139, potassium 4.0, chloride 109, bicarb 24, BUN of 25, serum creatinine 1.13, eGFR greater than 60, nonfasting blood glucose 92, WBC 5.8, hemoglobin 13.8, platelets of 259.   Blood cultures, are in process.  Procalcitonin was less than 0.10, lactic acid was within normal limits.  Magnesium level was 2.1.  UA was negative for leukocytes and nitrates.   Portable chest x-ray was read as rotated portable exam with lower lung volumes.  CT of the head without contrast: Was read as no evidence of acute intracranial abnormality.  Redemonstrated chronic cortical/subcortical infarcts within the right frontal and left occipital lobes.  Redemonstrated chronic infarct within the left cerebellar hemisphere.   Per EDP patient has not required one-to-one sitter over the last few days.  4/6: Patient admitted to the hospitalist service.  Hypothermia and decreased level of responsiveness of completely resolved  4/8: Patient had some agitation last night.  Required IM Geodon x 1 4/9: 2 family members at bedside this morning.  Relayed some of the barriers to discharge and the concern regarding difficulty finding a  disposition plan. 4/17: Appreciate psychiatry recommendations and adjustment of medications 4/23: Psychiatry re-engaged due to worsening agitation/delirium   Assessment & Plan:   Principal Problem:   Dementia with behavioral disturbance Active Problems:   Mixed hyperlipidemia   History of TIA (transient ischemic attack)   Anxiety and depression   BPH (benign prostatic hyperplasia)   OAB (overactive bladder)   Altered mental status   Hypothermia   Restless leg   Vitamin B12 deficiency  Altered mental status With hypotension and hypothermia, now resolved Ammonia normal Plan: Continue bedside sitter Delirium precautions Appreciate psychiatry follow-up and medication recommendations Patient has been more agitated Psych re-consulted, meds adjusted  Restless leg Requip trial 0.5 mg nightly   Hypothermia Resolved   Dementia with behavioral disturbance Appreciate psychiatric regimen as dictated by psychiatry service Required IM Geodon  x 1 on 4/19   BPH (benign prostatic hyperplasia) Flomax 0.4 mg nightly   DVT prophylaxis: Lovenox Code Status: Full Family Communication: 2 family members at bedside 4/9 Disposition Plan: Status is: Inpatient Remains inpatient appropriate because: Unsafe discharge plan   Level of care: Telemetry Medical  Consultants:  None  Procedures:  None  Antimicrobials: None   Subjective: Seen and examined.  More agitated this AM  Objective: Vitals:   09/23/22 1514 09/23/22 1925 09/24/22 0500 09/24/22 1048  BP: 126/61 123/67 122/62 (!) 88/54  Pulse: 90 85 76 66  Resp: Temp: (!) 97.4 F (36.3 C) 97.9 F (36.6 C) 98.4 F (36.9 C) 97.9 F (36.6 C)  TempSrc:      SpO2: 99% 100%  98%  Weight:      Height:  Intake/Output Summary (Last 24 hours) at 09/24/2022 1403 Last data filed at 09/24/2022 0500 Gross per 24 hour  Intake --  Output 4100 ml  Net -4100 ml    Filed Weights   08/08/22 1205 08/14/22  0038 09/07/22 0057  Weight: 81 kg 78.2 kg 78.2 kg    Examination:  General exam: NAD,appears frail Respiratory system: Clear to auscultation. Respiratory effort normal. Cardiovascular system: S1-S2, RRR, no murmurs, no pedal edema Gastrointestinal system: Soft, NT/ND, normal bowel sounds Central nervous system: Awake, oriented x 2, no focal deficits Extremities: Symmetric 5 x 5 power. Skin: No rashes, lesions or ulcers Psychiatry: Judgement and insight appear impaired. Mood & affect confused.     Data Reviewed: I have personally reviewed following labs and imaging studies  CBC: No results for input(s): "WBC", "NEUTROABS", "HGB", "HCT", "MCV", "PLT" in the last 168 hours.  Basic Metabolic Panel: No results for input(s): "NA", "K", "CL", "CO2", "GLUCOSE", "BUN", "CREATININE", "CALCIUM", "MG", "PHOS" in the last 168 hours.  GFR: Estimated Creatinine Clearance: 53 mL/min (by C-G formula based on SCr of 1.09 mg/dL). Liver Function Tests: No results for input(s): "AST", "ALT", "ALKPHOS", "BILITOT", "PROT", "ALBUMIN" in the last 168 hours. No results for input(s): "LIPASE", "AMYLASE" in the last 168 hours. No results for input(s): "AMMONIA" in the last 168 hours.  Coagulation Profile: No results for input(s): "INR", "PROTIME" in the last 168 hours. Cardiac Enzymes: No results for input(s): "CKTOTAL", "CKMB", "CKMBINDEX", "TROPONINI" in the last 168 hours. BNP (last 3 results) No results for input(s): "PROBNP" in the last 8760 hours. HbA1C: No results for input(s): "HGBA1C" in the last 72 hours. CBG: No results for input(s): "GLUCAP" in the last 168 hours. Lipid Profile: No results for input(s): "CHOL", "HDL", "LDLCALC", "TRIG", "CHOLHDL", "LDLDIRECT" in the last 72 hours. Thyroid Function Tests: No results for input(s): "TSH", "T4TOTAL", "FREET4", "T3FREE", "THYROIDAB" in the last 72 hours.  Anemia Panel: No results for input(s): "VITAMINB12", "FOLATE", "FERRITIN", "TIBC",  "IRON", "RETICCTPCT" in the last 72 hours.  Sepsis Labs: No results for input(s): "PROCALCITON", "LATICACIDVEN" in the last 168 hours.   No results found for this or any previous visit (from the past 240 hour(s)).        Radiology Studies: No results found.      Scheduled Meds:  diphenhydrAMINE  25 mg Oral Once   divalproex  250 mg Oral Q12H   donepezil  5 mg Oral Daily   enoxaparin (LOVENOX) injection  40 mg Subcutaneous Q24H   feeding supplement  237 mL Oral BID BM   pravastatin  40 mg Oral QHS   QUEtiapine  100 mg Oral QHS   [START ON 09/25/2022] QUEtiapine  50 mg Oral BID   rOPINIRole  0.5 mg Oral QHS   tamsulosin  0.4 mg Oral QPC supper   Continuous Infusions:   LOS: 18 days     Tresa Moore, MD [facility]   If 7PM-7AM, please contact night-coverage  09/24/2022, 2:03 PM

## 2022-09-24 NOTE — Consult Note (Signed)
[facility] Face-to-Face Psychiatry Consult   Reason for Consult: Follow-up 82 year old man with advanced dementia with worsening behavior issues Referring Physician:  Georgeann Oppenheim Patient Identification: Terry Sloan MRN:  [id number] Principal Diagnosis: Dementia with behavioral disturbance Diagnosis:  Principal Problem:   Dementia with behavioral disturbance Active Problems:   Mixed hyperlipidemia   History of TIA (transient ischemic attack)   Anxiety and depression   BPH (benign prostatic hyperplasia)   OAB (overactive bladder)   Altered mental status   Hypothermia   Restless leg   Vitamin B12 deficiency   Total Time spent with patient: 30 minutes  Subjective:   Terry Sloan is a 82 y.o. male patient admitted with patient unable to give history.  Obtained history from the chart and from speaking with his family who were present in the room.Marland Kitchen  HPI: 82 year old man with a history of advanced dementia who has been seen previously by the behavioral health team for agitation.  Has developed intermittent agitated behaviors it seems over the last couple months.  Reports that last night the patient became agitated in the late night hours had to be given as needed haloperidol.  When I came to see him today the patient was sitting up in bed and was allowing his relative to feed him but was not speaking making eye contact or showing any other signs of alertness.  Family reports that he had been like that all day long.  They say this is typical after he has been giving haloperidol.  They understand that he has intermittent spells of agitation as well.  Their only concern with medication is that the Haldol may be over sedating and also that his blood pressure seems to go low intermittently.  Patient himself unable to make any complaint.  Was not agitated at least with his family.  Primary issue at this point seems to be largely placement.  Past Psychiatric History: Past history of dementia although family has  reported that the patient had a long history of anger and irritability but no specific psychiatric treatment  Risk to Self:   Risk to Others:   Prior Inpatient Therapy:   Prior Outpatient Therapy:    Past Medical History:  Past Medical History:  Diagnosis Date   Arthritis    Dementia    per EMS   GERD (gastroesophageal reflux disease)    [name]   History of kidney stones    Hyperlipidemia    Macular degeneration    PVD (peripheral vascular disease)    Stroke    TIA      Past Surgical History:  Procedure Laterality Date   Bone turmor Left    foreheah   EYE SURGERY Bilateral    cataract removal   FRACTURE SURGERY Left    leg   pins   KNEE ARTHROPLASTY     KNEE SURGERY Left    NECK SURGERY     Tumor reomoved from head/neck in the 60s   REPLACEMENT TOTAL KNEE Left 07/14/2018   ROTATOR CUFF REPAIR Right    times 2   TOTAL KNEE ARTHROPLASTY WITH HARDWARE REMOVAL Left 02/14/2017   Procedure: Removal Left Tibia Nail, Cemented Total Knee Arthroplasty;  Surgeon: Eldred Manges, MD;  Location: [facility] OR;  Service: Orthopedics;  Laterality: Left;   Family History:  Family History  Problem Relation Age of Onset   Stroke Neg Hx    Family Psychiatric  History: See previous Social History:  Social History   Substance and Sexual Activity  Alcohol Use Yes   Comment: Occasional beer     Social History   Substance and Sexual Activity  Drug Use No    Social History   Socioeconomic History   Marital status: Media planner    Spouse name: Not on file   Number of children: 9   Years of education: Not on file   Highest education level: Not on file  Occupational History   Occupation: Retired  Tobacco Use   Smoking status: Former    Packs/day: 1.50    Years: 20.00    Additional pack years: 0.00    Total pack years: 30.00    Types: Cigarettes   Smokeless tobacco: Never   Tobacco comments:    Quit 20+ year ago  Vaping Use   Vaping Use: Never used  Substance and Sexual  Activity   Alcohol use: Yes    Comment: Occasional beer   Drug use: No   Sexual activity: Not Currently  Other Topics Concern   Not on file  Social History Narrative   Lives at home w/ his significant other   Right-handed   Caffeine: some coffee, 3 diet Mt Dew's per day   Social Determinants of Health   Financial Resource Strain: Low Risk  (07/23/2022)   Overall Financial Resource Strain (CARDIA)    Difficulty of Paying Living Expenses: Not hard at all  Food Insecurity: Patient Unable To Answer (09/07/2022)   Hunger Vital Sign    Worried About Running Out of Food in the Last Year: Patient unable to answer    Ran Out of Food in the Last Year: Patient unable to answer  Transportation Needs: Patient Unable To Answer (09/07/2022)   PRAPARE - Transportation    Lack of Transportation (Medical): Patient unable to answer    Lack of Transportation (Non-Medical): Patient unable to answer  Physical Activity: Insufficiently Active (07/23/2022)   Exercise Vital Sign    Days of Exercise per Week: 3 days    Minutes of Exercise per Session: 30 min  Stress: No Stress Concern Present (07/23/2022)   [employer] of Occupational Health - Occupational Stress Questionnaire    Feeling of Stress : Not at all  Social Connections: Socially Isolated (07/23/2022)   Social Connection and Isolation Panel [NHANES]    Frequency of Communication with Friends and Family: More than three times a week    Frequency of Social Gatherings with Friends and Family: Never    Attends Religious Services: Never    Database administrator or Organizations: No    Attends Banker Meetings: Never    Marital Status: Widowed   Additional Social History:    Allergies:   Allergies  Allergen Reactions   Celebrex [Celecoxib] Other (See Comments)    Leg swelling, broke out in rash    Labs: No results found for this or any previous visit (from the past 48 hour(s)).  Current Facility-Administered Medications   Medication Dose Route Frequency Provider Last Rate Last Admin   acetaminophen (TYLENOL) tablet 650 mg  650 mg Oral Q6H PRN Bennett, Christal H, NP   650 mg at 09/22/22 0707   diphenhydrAMINE (BENADRYL) capsule 25 mg  25 mg Oral Once Foust, Katy L, NP       divalproex (DEPAKOTE SPRINKLE) capsule 250 mg  250 mg Oral Q12H Cordella Register A, RPH   250 mg at 09/24/22 0839   donepezil (ARICEPT) tablet 5 mg  5 mg Oral Daily Cox, Amy N, DO   5  mg at 09/24/22 0746   enoxaparin (LOVENOX) injection 40 mg  40 mg Subcutaneous Q24H Cox, Amy N, DO   40 mg at 09/23/22 2229   feeding supplement (ENSURE ENLIVE / ENSURE PLUS) liquid 237 mL  237 mL Oral BID BM Cox, Amy N, DO   237 mL at 09/24/22 0746   haloperidol (HALDOL) tablet 5 mg  5 mg Oral BID PRN Willeen Cass, Christal H, NP   5 mg at 09/24/22 1610   Or   haloperidol lactate (HALDOL) injection 5 mg  5 mg Intramuscular BID PRN Willeen Cass, Christal H, NP   5 mg at 09/24/22 0217   pravastatin (PRAVACHOL) tablet 40 mg  40 mg Oral QHS Cox, Amy N, DO   40 mg at 09/23/22 2229   QUEtiapine (SEROQUEL) tablet 100 mg  100 mg Oral QHS Baker Kogler T, MD       QUEtiapine (SEROQUEL) tablet 50 mg  50 mg Oral BID Nneoma Harral T, MD       rOPINIRole (REQUIP) tablet 0.5 mg  0.5 mg Oral QHS Bennett, Christal H, NP   0.5 mg at 09/23/22 2230   tamsulosin (FLOMAX) capsule 0.4 mg  0.4 mg Oral QPC supper Cox, Amy N, DO   0.4 mg at 09/23/22 1833   traZODone (DESYREL) tablet 50 mg  50 mg Oral QHS PRN Hampton Abbot, NP        Musculoskeletal: Strength & Muscle Tone: decreased Gait & Station: unsteady Patient leans: N/A            Psychiatric Specialty Exam:  Presentation  General Appearance:  Disheveled  Eye Contact: None  Speech: Slurred  Speech Volume: Decreased  Handedness: Right   Mood and Affect  Mood: Labile; Anxious  Affect: Labile   Thought Process  Thought Processes: Disorganized  Descriptions of  Associations:Loose  Orientation:Partial (Oriented to self only)  Thought Content:Other (comment) (Minimal conversaton with this Clinical research associate)  History of Schizophrenia/Schizoaffective disorder:No data recorded Duration of Psychotic Symptoms:No data recorded Hallucinations:No data recorded Ideas of Reference:Other (comment) (Unablel to assess)  Suicidal Thoughts:No data recorded Homicidal Thoughts:No data recorded  Sensorium  Memory: Immediate Poor; Recent Poor; Remote Poor  Judgment: Impaired  Insight: Lacking   Executive Functions  Concentration: Poor  Attention Span: Poor  Recall: Other (comment) (Unable to assess)  Fund of Knowledge: Other (comment) (Unable to assess)  Language: Poor   Psychomotor Activity  Psychomotor Activity:No data recorded  Assets  Assets: Financial Resources/Insurance; Social Support   Sleep  Sleep:No data recorded  Physical Exam: Physical Exam Vitals and nursing note reviewed.  Constitutional:      Appearance: Normal appearance. He is ill-appearing.  HENT:     Head: Normocephalic and atraumatic.     Mouth/Throat:     Pharynx: Oropharynx is clear.  Eyes:     Pupils: Pupils are equal, round, and reactive to light.  Cardiovascular:     Rate and Rhythm: Normal rate and regular rhythm.  Pulmonary:     Effort: Pulmonary effort is normal.     Breath sounds: Normal breath sounds.  Abdominal:     General: Abdomen is flat.     Palpations: Abdomen is soft.  Musculoskeletal:        General: Normal range of motion.  Skin:    General: Skin is warm and dry.  Neurological:     General: No focal deficit present.     Mental Status: Mental status is at baseline.  Psychiatric:  Attention and Perception: He is inattentive.        Mood and Affect: Affect is flat.        Speech: He is noncommunicative.    Review of Systems  Unable to perform ROS: Dementia   Blood pressure (!) 88/54, pulse 66, temperature 97.9 F (36.6 C),  resp. rate 16, height 5\' 10"  (1.778 m), weight 78.2 kg, SpO2 98 %. Body mass index is 24.74 kg/m.  Treatment Plan Summary: Medication management and Plan reviewed medication.  Patient is currently on low-dose Depakote presumably for agitation.  The data on Depakote being helpful for agitation is rather weak.  Usually it does not provide much of a benefit I think especially not with the as needed usage.  Family's goal seems to be doing help the patient not have spells of agitation but also not to have side effects including low blood pressure.  Patient had currently been ordered to receive Seroquel 100 mg in the morning and 50 mg at night.  I am unclear why this dosing schedule was done usually we would put the higher dose at night.  I am going to switch those around to 100 mg at night 50 mg twice a day once at 8:00 in the morning once at 5:00 in the afternoon.  Continue other as needed medicines for now.  Reassured family that we are working on trying to come up with the safest protocol possible.  Disposition: Supportive therapy provided about ongoing stressors.  Mordecai Rasmussen, MD 09/24/2022 12:00 PM

## 2022-09-24 NOTE — Progress Notes (Signed)
Physical Therapy Treatment Patient Details Name: Terry Sloan MRN: [id number] DOB: [birth year] Today's Date: 09/24/2022   History of Present Illness 82 year old male is a 82 year old male with history of dementia, behavioral disturbance, hyperlipidemia, BPH, insomnia, depression, anxiety, who presented to emergency department from [facility] rehab dementia department on 08/08/2022 for chief concerns of aggression with aggressive outburst towards staff. On day of admission, nursing noticed that patient's temperature was low, decrease responsiveness/change in mentation, and hypotension.    PT Comments    Pt is making good progress towards goals and per chart review ambulated with mobility specialist earlier this date. Pt agreeable to 2nd bout of ambulation. RW adjusted to appropriate height and pt demonstrates ability to perform head turns during ambulation in addition to carrying conversation. Still struggles with sequencing of RW and obstacle avoidance. Will continue to progress. Updated disposition.   Recommendations for follow up therapy are one component of a multi-disciplinary discharge planning process, led by the attending physician.  Recommendations may be updated based on patient status, additional functional criteria and insurance authorization.  Follow Up Recommendations       Assistance Recommended at Discharge Frequent or constant Supervision/Assistance  Patient can return home with the following A little help with walking and/or transfers;A lot of help with bathing/dressing/bathroom;Assistance with cooking/housework;Assistance with feeding;Direct supervision/assist for medications management;Direct supervision/assist for financial management;Assist for transportation;Help with stairs or ramp for entrance   Equipment Recommendations  Rolling walker (2 wheels)    Recommendations for Other Services       Precautions / Restrictions Precautions Precautions: Fall Restrictions Weight  Bearing Restrictions: No     Mobility  Bed Mobility Overal bed mobility: Needs Assistance Bed Mobility: Supine to Sit, Sit to Supine     Supine to sit: Min assist     General bed mobility comments: reaches out for therapist hand. Once seated at EOB, able to maintain upright posture. Follows commands well    Transfers Overall transfer level: Needs assistance Equipment used: Rolling walker (2 wheels) Transfers: Sit to/from Stand Sit to Stand: Supervision           General transfer comment: upright posture. Slightly impulsive and attempts to stand prior to therapist instruction    Ambulation/Gait Ambulation/Gait assistance: Min guard Gait Distance (Feet): 200 Feet Assistive device: Rolling walker (2 wheels) Gait Pattern/deviations: Decreased step length - right, Decreased step length - left, Trunk flexed, Drifts right/left, Narrow base of support       General Gait Details: forward flexed posture with several cues for hand placement. Needs cues for sequencing and obstacle avoidance   Stairs             Wheelchair Mobility    Modified Rankin (Stroke Patients Only)       Balance Overall balance assessment: Needs assistance Sitting-balance support: Feet supported Sitting balance-Leahy Scale: Good     Standing balance support: During functional activity, Reliant on assistive device for balance Standing balance-Leahy Scale: Fair                              Cognition Arousal/Alertness: Awake/alert Behavior During Therapy: Flat affect Overall Cognitive Status: No family/caregiver present to determine baseline cognitive functioning                                 General Comments: alert and oriented to self. Pleasant and agreeable to session  Exercises      General Comments        Pertinent Vitals/Pain Pain Assessment Pain Assessment: No/denies pain    Home Living                          Prior  Function            PT Goals (current goals can now be found in the care plan section) Acute Rehab PT Goals Patient Stated Goal: none stated PT Goal Formulation: With patient Time For Goal Achievement: 09/20/22 Potential to Achieve Goals: Fair Progress towards PT goals: Progressing toward goals    Frequency    Min 2X/week      PT Plan Current plan remains appropriate    Co-evaluation              AM-PAC PT "6 Clicks" Mobility   Outcome Measure  Help needed turning from your back to your side while in a flat bed without using bedrails?: A Little Help needed moving from lying on your back to sitting on the side of a flat bed without using bedrails?: A Little Help needed moving to and from a bed to a chair (including a wheelchair)?: A Little Help needed standing up from a chair using your arms (e.g., wheelchair or bedside chair)?: A Little Help needed to walk in hospital room?: A Little Help needed climbing 3-5 steps with a railing? : A Lot 6 Click Score: 17    End of Session Equipment Utilized During Treatment: Gait belt Activity Tolerance: Patient tolerated treatment well Patient left: in chair;with call bell/phone within reach;with nursing/sitter in room Nurse Communication: Mobility status PT Visit Diagnosis: Unsteadiness on feet (R26.81);Muscle weakness (generalized) (M62.81);Other abnormalities of gait and mobility (R26.89)     Time: 1308-6578 PT Time Calculation (min) (ACUTE ONLY): 15 min  Charges:  $Gait Training: 8-22 mins                     [name], PT, DPT, GCS [phone]    [name] 09/24/2022, 3:59 PM

## 2022-09-24 NOTE — Progress Notes (Signed)
Patient given prn oral haldol, per MD, for agitation and attempting to jump off the bed.

## 2022-09-24 NOTE — Progress Notes (Signed)
Mobility Specialist - Progress Note   09/24/22 1407  Mobility  Activity Ambulated with assistance in hallway  Level of Assistance Contact guard assist, steadying assist  Assistive Device Front wheel walker  Distance Ambulated (ft) 180 ft  Activity Response Tolerated well  $Mobility charge 1 Mobility   Pt sitting in bed upon entry, utilizing RA. Pt completed bed mob MinA-- HHA for trunk support. Pt STS to RW MinA-CGA. Pt left foot sliding upon standing-- corrected with foot blocking. Pt amb one lap around NS with chair follow, requiring more VCs and manual movement of RW this date. Pt returned to room, left seated in recliner with sitter present at bedside.   Zetta Bills Mobility Specialist 09/24/22 2:12 PM

## 2022-09-24 NOTE — Progress Notes (Signed)
       CROSS COVER NOTE  NAME: Terry Sloan MRN: [id number] DOB : 30-Oct-1940 ATTENDING PHYSICIAN: Tresa Moore, MD    Date of Service   09/24/2022   HPI/Events of Note   Report/Request  "This man is off the chart tonight. I have given him Geodon and it has done nothing to help him. He has jumped on bed, then got on his hands and knee and peed int he bed, swinging at staff (has a 1:1 sitter) got his leg stuck in the side rail. He has Haldol 5 mg IM to give but I am going to try but do not think it will work. I do not know what else we can do for him at this point . Any suggestions would be appreciated."  Interventions   Assessment/Plan: Benadryl x1       To reach the provider On-Call:   7AM- 7PM see care teams to locate the attending and reach out to them via [email or web address]. Password: [email or web address] 7PM-7AM contact night-coverage If you still have difficulty reaching the appropriate provider, please page the [facility] (Director on Call) for [facility] on amion for assistance  This document was prepared using Conservation officer, historic buildings and may include unintentional dictation errors.  Bishop Limbo DNP, MBA, FNP-BC, PMHNP-BC Nurse Practitioner [facility] Pager [phone]

## 2022-09-25 DIAGNOSIS — F03918 Unspecified dementia, unspecified severity, with other behavioral disturbance: Secondary | ICD-10-CM | POA: Diagnosis not present

## 2022-09-25 LAB — CBC WITH DIFFERENTIAL/PLATELET
Abs Immature Granulocytes: 0.02 10*3/uL (ref 0.00–0.07)
Basophils Absolute: 0 10*3/uL (ref 0.0–0.1)
Basophils Relative: 1 %
Eosinophils Absolute: 0.1 10*3/uL (ref 0.0–0.5)
Eosinophils Relative: 3 %
HCT: 36.2 % — ABNORMAL LOW (ref 39.0–52.0)
Hemoglobin: 11.7 g/dL — ABNORMAL LOW (ref 13.0–17.0)
Immature Granulocytes: 0 %
Lymphocytes Relative: 18 %
Lymphs Abs: 1 10*3/uL (ref 0.7–4.0)
MCH: 31 pg (ref 26.0–34.0)
MCHC: 32.3 g/dL (ref 30.0–36.0)
MCV: 96 fL (ref 80.0–100.0)
Monocytes Absolute: 1 10*3/uL (ref 0.1–1.0)
Monocytes Relative: 17 %
Neutro Abs: 3.5 10*3/uL (ref 1.7–7.7)
Neutrophils Relative %: 61 %
Platelets: 280 10*3/uL (ref 150–400)
RBC: 3.77 MIL/uL — ABNORMAL LOW (ref 4.22–5.81)
RDW: 12.3 % (ref 11.5–15.5)
WBC: 5.7 10*3/uL (ref 4.0–10.5)
nRBC: 0 % (ref 0.0–0.2)

## 2022-09-25 LAB — BASIC METABOLIC PANEL
Anion gap: 9 (ref 5–15)
BUN: 27 mg/dL — ABNORMAL HIGH (ref 8–23)
CO2: 23 mmol/L (ref 22–32)
Calcium: 8.7 mg/dL — ABNORMAL LOW (ref 8.9–10.3)
Chloride: 107 mmol/L (ref 98–111)
Creatinine, Ser: 1.05 mg/dL (ref 0.61–1.24)
GFR, Estimated: >60 mL/min (ref >60)
Glucose, Bld: 94 mg/dL (ref 70–99)
Potassium: 4.1 mmol/L (ref 3.5–5.1)
Sodium: 139 mmol/L (ref 135–145)

## 2022-09-25 MED ORDER — VITAMIN B-12 1000 MCG PO TABS
1000.0000 ug | ORAL_TABLET | Freq: Every day | ORAL | Status: DC
  Administered 2022-09-26 – 2022-11-08 (×39): 1000 ug via ORAL
  Filled 2022-09-25 (×39): qty 1

## 2022-09-25 MED ORDER — QUETIAPINE FUMARATE 25 MG PO TABS
50.0000 mg | ORAL_TABLET | Freq: Every day | ORAL | Status: DC
  Administered 2022-09-26 – 2022-09-27 (×2): 50 mg via ORAL
  Filled 2022-09-25: qty 2

## 2022-09-25 NOTE — Consult Note (Signed)
Upon approach there is a sitter present at the bedside for fall precautions. On evaluation today, the patient is observed sitting up in recliner chair at the bedside. Staff reports "he is just waking up" from the previous night (the present time is 1320). He has a meal tray sitting on a bedside table in front of him; sitter observed assisting with meal set up.  He appears drowsy. He is oriented to self only. Current medication regimen includes Seroquel 50 mg twice daily, and 100 mg at bedtime to manage psychiatric symptoms. Patient was administered as needed haloperidol last night at approximately 2153, along with bedtime regimen which likely contributes to current sedation. Per MAR review, no routine morning medications were administered today, most likely due to ongoing sleep extending into the early afternoon. No aggressive or negative behaviors noted during this encounter. Seroquel adjusted from 50 mg twice daily to 50 mg daily and maintaining 100 mg at bedtime to manage his symptoms.   Kosei Rhodes H. Willeen Cass, NP 09/25/2022 1419

## 2022-09-25 NOTE — Progress Notes (Signed)
Progress Note    Terry Sloan  ZOX:[id number] DOB: [birth year]  DOA: 08/08/2022 PCP: Pcp, No      Brief Narrative:    Medical records reviewed and are as summarized below:  Terry Sloan is a 82 y.o. male  with history of dementia, behavioral disturbance, hyperlipidemia, BPH, insomnia, depression, anxiety, who presents to emergency department from [facility] rehab dementia department on 08/08/2022 for chief concerns of aggression with aggressive outburst towards staff.   Patient was initially IVC.  Behavioral health was consulted and determined that patient was not a danger to self or others.  Patient was pending placement, however was declined due to as needed Haldol doses were given.   On day of admission, nursing noticed that patient's temperature was low, decrease responsiveness/change in mentation, and hypotension.      Assessment/Plan:   Principal Problem:   Dementia with behavioral disturbance Active Problems:   Mixed hyperlipidemia   History of TIA (transient ischemic attack)   Anxiety and depression   BPH (benign prostatic hyperplasia)   OAB (overactive bladder)   Altered mental status   Hypothermia   Restless leg   Vitamin B12 deficiency   Body mass index is 24.74 kg/m.    Altered mental status With hypotension and hypothermia, now resolved..  Ammonia level was normal Continue supportive care.    Restless leg Continue Requip nightly   Hypothermia Resolved   Dementia with behavioral disturbance, intermittent agitation He was reevaluated by Dr. Toni Amend, psychiatrist, on 09/24/2022.  He is on Seroquel, Haldol as needed, Depakote, ropinirole and donepezil.  Trazodone as needed for sleep at night.   BPH (benign prostatic hyperplasia) Continue Flomax    Anxiety and depression Continue Seroquel   Vitamin B12 deficiency  B12 level was 167 on 09/06/2022.  Replete with oral vitamin B12    Diet Order             Diet regular Fluid consistency:  Thin  Diet effective now                            Consultants: Psychiatrist  Procedures: None    Medications:    [START ON 09/26/2022] vitamin B-12  1,000 mcg Oral Daily   diphenhydrAMINE  25 mg Oral Once   divalproex  250 mg Oral Q12H   donepezil  5 mg Oral Daily   enoxaparin (LOVENOX) injection  40 mg Subcutaneous Q24H   feeding supplement  237 mL Oral BID BM   pravastatin  40 mg Oral QHS   QUEtiapine  100 mg Oral QHS   [START ON 09/26/2022] QUEtiapine  50 mg Oral Daily   rOPINIRole  0.5 mg Oral QHS   tamsulosin  0.4 mg Oral QPC supper   Continuous Infusions:   Anti-infectives (From admission, onward)    None              Family Communication/Anticipated D/C date and plan/Code Status   DVT prophylaxis: enoxaparin (LOVENOX) injection 40 mg Start: 09/06/22 2200 Place TED hose Start: 09/06/22 1609     Code Status: Full Code  Family Communication: None Disposition Plan: Plan to discharge to SNF   Status is: Inpatient Remains inpatient appropriate because: Awaiting placement to SNF       Subjective:   Interval events noted.  He is sleepy and confused and cannot provide any history.  Objective:    Vitals:   09/24/22 0500 09/24/22 1048 09/24/22 1935 09/25/22  1336  BP: 122/62 (!) 88/54 116/60 (!) 106/59  Pulse: 76 66 98 79  Resp: Temp: 98.4 F (36.9 C) 97.9 F (36.6 C) 98.7 F (37.1 C) 97.8 F (36.6 C)  TempSrc:   Oral   SpO2:  98% (!) 83% 97%  Weight:      Height:       No data found.   Intake/Output Summary (Last 24 hours) at 09/25/2022 1447 Last data filed at 09/25/2022 1415 Gross per 24 hour  Intake 220 ml  Output --  Net 220 ml   Filed Weights   08/08/22 1205 08/14/22 0038 09/07/22 0057  Weight: 81 kg 78.2 kg 78.2 kg    Exam:  GEN: NAD SKIN: Warm and dry EYES: No acute abnormality ENT: MMM CV: RRR PULM: CTA B ABD: soft, ND, +BS CNS: Sleepy and difficult to assess EXT: No edema or  tenderness PSYCH: Sleepy and difficult to assess    Data Reviewed:   I have personally reviewed following labs and imaging studies:  Labs: Labs show the following:   Basic Metabolic Panel: Recent Labs  Lab 09/25/22 0531  NA 139  K 4.1  CL 107  CO2 23  GLUCOSE 94  BUN 27*  CREATININE 1.05  CALCIUM 8.7*    GFR Estimated Creatinine Clearance: 55 mL/min (by C-G formula based on SCr of 1.05 mg/dL). Liver Function Tests: No results for input(s): "AST", "ALT", "ALKPHOS", "BILITOT", "PROT", "ALBUMIN" in the last 168 hours. No results for input(s): "LIPASE", "AMYLASE" in the last 168 hours. No results for input(s): "AMMONIA" in the last 168 hours.  Coagulation profile No results for input(s): "INR", "PROTIME" in the last 168 hours.  CBC: Recent Labs  Lab 09/25/22 0531  WBC 5.7  NEUTROABS 3.5  HGB 11.7*  HCT 36.2*  MCV 96.0  PLT 280    Cardiac Enzymes: No results for input(s): "CKTOTAL", "CKMB", "CKMBINDEX", "TROPONINI" in the last 168 hours. BNP (last 3 results) No results for input(s): "PROBNP" in the last 8760 hours. CBG: No results for input(s): "GLUCAP" in the last 168 hours. D-Dimer: No results for input(s): "DDIMER" in the last 72 hours. Hgb A1c: No results for input(s): "HGBA1C" in the last 72 hours. Lipid Profile: No results for input(s): "CHOL", "HDL", "LDLCALC", "TRIG", "CHOLHDL", "LDLDIRECT" in the last 72 hours. Thyroid function studies: No results for input(s): "TSH", "T4TOTAL", "T3FREE", "THYROIDAB" in the last 72 hours.  Invalid input(s): "FREET3" Anemia work up: No results for input(s): "VITAMINB12", "FOLATE", "FERRITIN", "TIBC", "IRON", "RETICCTPCT" in the last 72 hours. Sepsis Labs: Recent Labs  Lab 09/25/22 0531  WBC 5.7     Microbiology No results found for this or any previous visit (from the past 240 hour(s)).   Procedures and diagnostic studies:  No results found.             LOS: 19 days   Terry Sloan  [facility] loss adjuster on [email or web address]. If 7PM-7AM, please contact night-coverage at [email or web address]     09/25/2022, 2:47 PM

## 2022-09-26 ENCOUNTER — Ambulatory Visit: Payer: Medicare HMO | Admitting: Urology

## 2022-09-26 DIAGNOSIS — F03918 Unspecified dementia, unspecified severity, with other behavioral disturbance: Secondary | ICD-10-CM | POA: Diagnosis not present

## 2022-09-26 NOTE — Progress Notes (Signed)
Progress Note    Terry Sloan  RUE:[id number] DOB: 1941/05/21  DOA: 08/08/2022 PCP: Pcp, No      Brief Narrative:    Medical records reviewed and are as summarized below:  EVERTTE SONES is a 82 y.o. male  with history of dementia, behavioral disturbance, hyperlipidemia, BPH, insomnia, depression, anxiety, who presents to emergency department from [facility] rehab dementia department on 08/08/2022 for chief concerns of aggression with aggressive outburst towards staff.   Patient was initially IVC.  Behavioral health was consulted and determined that patient was not a danger to self or others.  Patient was pending placement, however was declined due to as needed Haldol doses were given.   On day of admission, nursing noticed that patient's temperature was low, decrease responsiveness/change in mentation, and hypotension.      Assessment/Plan:   Principal Problem:   Dementia with behavioral disturbance Active Problems:   Mixed hyperlipidemia   History of TIA (transient ischemic attack)   Anxiety and depression   BPH (benign prostatic hyperplasia)   OAB (overactive bladder)   Altered mental status   Hypothermia   Restless leg   Vitamin B12 deficiency   Body mass index is 24.74 kg/m.    Altered mental status With hypotension and hypothermia, now resolved..  Ammonia level was normal Continue supportive care.    Restless leg Continue Requip nightly   Hypothermia Resolved   Dementia with behavioral disturbance, intermittent agitation He was reevaluated by Dr. Toni Amend, psychiatrist, on 09/24/2022.  He was reevaluated by Christal, NP, with behavioral health team.  Dose of Seroquel has been decreased.  Continue Haldol as needed, Depakote, ropinirole and donepezil.  Trazodone as needed for sleep at night.   BPH (benign prostatic hyperplasia) Continue Flomax    Anxiety and depression Continue Seroquel   Vitamin B12 deficiency  B12 level was 167 on 09/06/2022.   Continue oral vitamin B12 supplement    Diet Order             Diet regular Fluid consistency: Thin  Diet effective now                            Consultants: Psychiatrist  Procedures: None    Medications:    vitamin B-12  1,000 mcg Oral Daily   diphenhydrAMINE  25 mg Oral Once   divalproex  250 mg Oral Q12H   donepezil  5 mg Oral Daily   enoxaparin (LOVENOX) injection  40 mg Subcutaneous Q24H   feeding supplement  237 mL Oral BID BM   pravastatin  40 mg Oral QHS   QUEtiapine  100 mg Oral QHS   QUEtiapine  50 mg Oral Daily   rOPINIRole  0.5 mg Oral QHS   tamsulosin  0.4 mg Oral QPC supper   Continuous Infusions:   Anti-infectives (From admission, onward)    None              Family Communication/Anticipated D/C date and plan/Code Status   DVT prophylaxis: enoxaparin (LOVENOX) injection 40 mg Start: 09/06/22 2200 Place TED hose Start: 09/06/22 1609     Code Status: Full Code  Family Communication: None Disposition Plan: Plan to discharge to SNF   Status is: Inpatient Remains inpatient appropriate because: Awaiting placement to SNF       Subjective:   Interval events noted.  He is confused and cannot provide any history.  Sitter at the bedside.  Objective:  Vitals:   09/25/22 2015 09/26/22 0429 09/26/22 0720 09/26/22 1231  BP: 112/76 120/66 (!) 110/58 106/64  Pulse: 82 81 80 60  Resp: Temp: 98.1 F (36.7 C) 98 F (36.7 C) 98.3 F (36.8 C) 97.8 F (36.6 C)  TempSrc: Oral     SpO2: 100% 100% 100% 99%  Weight:      Height:       No data found.   Intake/Output Summary (Last 24 hours) at 09/26/2022 1352 Last data filed at 09/26/2022 0700 Gross per 24 hour  Intake 220 ml  Output 800 ml  Net -580 ml   Filed Weights   08/08/22 1205 08/14/22 0038 09/07/22 0057  Weight: 81 kg 78.2 kg 78.2 kg    Exam:  GEN: NAD, sitting up on the hoaspital bed SKIN: Warm and dry EYES: Anicteric ENT: MMM CV:  RRR PULM: CTA B ABD: soft, ND, NT, +BS CNS: AAO x 1 (person), non focal EXT: No edema or tenderness PSYCH: Poor insight and judgment    Data Reviewed:   I have personally reviewed following labs and imaging studies:  Labs: Labs show the following:   Basic Metabolic Panel: Recent Labs  Lab 09/25/22 0531  NA 139  K 4.1  CL 107  CO2 23  GLUCOSE 94  BUN 27*  CREATININE 1.05  CALCIUM 8.7*    GFR Estimated Creatinine Clearance: 55 mL/min (by C-G formula based on SCr of 1.05 mg/dL). Liver Function Tests: No results for input(s): "AST", "ALT", "ALKPHOS", "BILITOT", "PROT", "ALBUMIN" in the last 168 hours. No results for input(s): "LIPASE", "AMYLASE" in the last 168 hours. No results for input(s): "AMMONIA" in the last 168 hours.  Coagulation profile No results for input(s): "INR", "PROTIME" in the last 168 hours.  CBC: Recent Labs  Lab 09/25/22 0531  WBC 5.7  NEUTROABS 3.5  HGB 11.7*  HCT 36.2*  MCV 96.0  PLT 280    Cardiac Enzymes: No results for input(s): "CKTOTAL", "CKMB", "CKMBINDEX", "TROPONINI" in the last 168 hours. BNP (last 3 results) No results for input(s): "PROBNP" in the last 8760 hours. CBG: No results for input(s): "GLUCAP" in the last 168 hours. D-Dimer: No results for input(s): "DDIMER" in the last 72 hours. Hgb A1c: No results for input(s): "HGBA1C" in the last 72 hours. Lipid Profile: No results for input(s): "CHOL", "HDL", "LDLCALC", "TRIG", "CHOLHDL", "LDLDIRECT" in the last 72 hours. Thyroid function studies: No results for input(s): "TSH", "T4TOTAL", "T3FREE", "THYROIDAB" in the last 72 hours.  Invalid input(s): "FREET3" Anemia work up: No results for input(s): "VITAMINB12", "FOLATE", "FERRITIN", "TIBC", "IRON", "RETICCTPCT" in the last 72 hours. Sepsis Labs: Recent Labs  Lab 09/25/22 0531  WBC 5.7     Microbiology No results found for this or any previous visit (from the past 240 hour(s)).   Procedures and diagnostic  studies:  No results found.             LOS: 20 days   Jakira Mcfadden  [facility]   Pager on [email or web address]. If 7PM-7AM, please contact night-coverage at [email or web address]     09/26/2022, 1:52 PM

## 2022-09-26 NOTE — Progress Notes (Signed)
Physical Therapy Treatment Patient Details Name: Terry Sloan MRN: [id number] DOB: 1941-04-24 Today's Date: 09/26/2022   History of Present Illness 82 year old male is a 82 year old male with history of dementia, behavioral disturbance, hyperlipidemia, BPH, insomnia, depression, anxiety, who presented to emergency department from [facility] rehab dementia department on 08/08/2022 for chief concerns of aggression with aggressive outburst towards staff. On day of admission, nursing noticed that patient's temperature was low, decrease responsiveness/change in mentation, and hypotension.    PT Comments    Patient in bed with sitter present in room at start of session. Patient agreeable to therapy session. Minimal initiation to get out of bed; therefore require Min A from therapist to sit edge of bed. Completed ambulation around nursing station with RW and CGA. Continue to require frequent cues for negotiation and hand placement on AD. Patient has achieved baseline and met acute care PT goals at this time, will d/c in house. Will defer to mobility specialist for continued participation in mobility throughout hospitalization.    Recommendations for follow up therapy are one component of a multi-disciplinary discharge planning process, led by the attending physician.  Recommendations may be updated based on patient status, additional functional criteria and insurance authorization.  Follow Up Recommendations  Can patient physically be transported by private vehicle: Yes    Assistance Recommended at Discharge Frequent or constant Supervision/Assistance  Patient can return home with the following A little help with walking and/or transfers;A lot of help with bathing/dressing/bathroom;Assistance with cooking/housework;Assistance with feeding;Direct supervision/assist for medications management;Direct supervision/assist for financial management;Assist for transportation;Help with stairs or ramp for entrance    Equipment Recommendations  Rolling walker (2 wheels)    Recommendations for Other Services       Precautions / Restrictions Precautions Precautions: Fall Restrictions Weight Bearing Restrictions: No     Mobility  Bed Mobility Overal bed mobility: Needs Assistance Bed Mobility: Supine to Sit, Sit to Supine     Supine to sit: Min assist Sit to supine: Min assist   General bed mobility comments: assistance required for scooting forward; hand held assistance and multiple cues for intitiation of task.    Transfers Overall transfer level: Needs assistance Equipment used: Rolling walker (2 wheels) Transfers: Sit to/from Stand Sit to Stand: Supervision           General transfer comment: cues for hand placement on AD require    Ambulation/Gait Ambulation/Gait assistance: Min guard Gait Distance (Feet): 200 Feet Assistive device: Rolling walker (2 wheels) Gait Pattern/deviations: Decreased step length - right, Decreased step length - left, Trunk flexed, Drifts right/left, Narrow base of support       General Gait Details: several cues for hand placement throughout ambulation; cues for obstacle negotiation/turns   Stairs             Wheelchair Mobility    Modified Rankin (Stroke Patients Only)       Balance Overall balance assessment: Needs assistance Sitting-balance support: Feet supported Sitting balance-Leahy Scale: Good     Standing balance support: During functional activity, Reliant on assistive device for balance Standing balance-Leahy Scale: Fair Standing balance comment: poor safety awareness                            Cognition Arousal/Alertness: Awake/alert Behavior During Therapy: Flat affect Overall Cognitive Status: No family/caregiver present to determine baseline cognitive functioning  General Comments: alert and oriented to self. agreeable to session, pt not talkative  throughout session compared to prior session        Exercises      General Comments General comments (skin integrity, edema, etc.): Pt less talkative and interactive throughout sesssion; flat affect      Pertinent Vitals/Pain Pain Assessment Pain Assessment: No/denies pain    Home Living                          Prior Function            PT Goals (current goals can now be found in the care plan section) Acute Rehab PT Goals Patient Stated Goal: none stated PT Goal Formulation: With patient Time For Goal Achievement: 09/26/22 Potential to Achieve Goals: Fair Progress towards PT goals: Goals met/education completed, patient discharged from PT    Frequency           PT Plan Frequency needs to be updated    Co-evaluation              AM-PAC PT "6 Clicks" Mobility   Outcome Measure  Help needed turning from your back to your side while in a flat bed without using bedrails?: A Little Help needed moving from lying on your back to sitting on the side of a flat bed without using bedrails?: A Little Help needed moving to and from a bed to a chair (including a wheelchair)?: A Little Help needed standing up from a chair using your arms (e.g., wheelchair or bedside chair)?: A Little Help needed to walk in hospital room?: A Little Help needed climbing 3-5 steps with a railing? : A Lot 6 Click Score: 17    End of Session Equipment Utilized During Treatment: Gait belt Activity Tolerance: Patient tolerated treatment well Patient left: in bed;with nursing/sitter in room Nurse Communication: Mobility status PT Visit Diagnosis: Unsteadiness on feet (R26.81);Muscle weakness (generalized) (M62.81);Other abnormalities of gait and mobility (R26.89)     Time: 1459-1511 PT Time Calculation (min) (ACUTE ONLY): 12 min  Charges:  $Gait Training: 8-22 mins                     Creed Copper Fairly, PT, DPT 09/26/22 4:16 PM  09/26/2022, 4:15 PM

## 2022-09-26 NOTE — Progress Notes (Signed)
Mobility Specialist - Progress Note   09/26/22 0958  Mobility  Activity Ambulated with assistance in hallway  Level of Assistance Standby assist, set-up cues, supervision of patient - no hands on  Assistive Device Front wheel walker  Distance Ambulated (ft) 180 ft  Activity Response Tolerated well  $Mobility charge 1 Mobility   Pt sitting in recliner upon entry, utilizing RA. Pt agreeable to amb in the hallway this date. Pt STS to RW and amb SBA. Pt amb one lap around the NS, requiring min physical/ manual movement of the RW and BUE for proper hand placement on RW while amb. Pt returned to room, left sitting in recliner with needs within reach. Sitter present at bedside.   Zetta Bills Mobility Specialist 09/26/22 10:03 AM

## 2022-09-27 DIAGNOSIS — F419 Anxiety disorder, unspecified: Secondary | ICD-10-CM | POA: Diagnosis not present

## 2022-09-27 DIAGNOSIS — F32A Depression, unspecified: Secondary | ICD-10-CM | POA: Diagnosis not present

## 2022-09-27 DIAGNOSIS — F03918 Unspecified dementia, unspecified severity, with other behavioral disturbance: Secondary | ICD-10-CM | POA: Diagnosis not present

## 2022-09-27 MED ORDER — QUETIAPINE FUMARATE 25 MG PO TABS
50.0000 mg | ORAL_TABLET | Freq: Two times a day (BID) | ORAL | Status: DC
  Administered 2022-09-28 – 2022-09-29 (×3): 50 mg via ORAL
  Filled 2022-09-27 (×3): qty 2

## 2022-09-27 NOTE — Progress Notes (Signed)
Sitter called me in due to the fact that the patient has been showing signs of severe aggegation. Sitter explained that different types of methods were use to calm the patient down but nothing worked. Observed the patient try to roll out of the bed, cussing at staff, and tried to swat at the sitter. Administered Haldol 5 mg PO to help alleviate aggegation. And stayed with the sitter to help give her some relief.

## 2022-09-27 NOTE — Consult Note (Signed)
[facility] Face-to-Face Psychiatry Consult   Reason for Consult: Follow-up 82 year old man with advanced dementia with worsening behavior issues Referring Physician:  Georgeann Oppenheim Patient Identification: Terry Sloan MRN:  [id number] Principal Diagnosis: Dementia with behavioral disturbance (HCC) Diagnosis:  Principal Problem:   Dementia with behavioral disturbance (HCC) Active Problems:   Mixed hyperlipidemia   History of TIA (transient ischemic attack)   Anxiety and depression   BPH (benign prostatic hyperplasia)   OAB (overactive bladder)   Altered mental status   Hypothermia   Restless leg   Vitamin B12 deficiency   Total Time spent with patient: 30 minutes.  Subjective:   Terry Sloan is a 82 y.o. male patient admitted with AMS.  4/26: The client was seen today and was trying to ambulate without waiting for assistance.  The tech reported, when he is ready to stand, he will not wait.  No aggression.  He awakened last night at 2 am and did not return to sleep until during the day and was sleeping all morning. Seroquel changed to BID versus daily to try and assist afternoon agitation.  HPI per Dr Toni Amend on 4/23: 83 year old man with a history of advanced dementia who has been seen previously by the behavioral health team for agitation.  Has developed intermittent agitated behaviors it seems over the last couple months.  Reports that last night the patient became agitated in the late night hours had to be given as needed haloperidol.  When I came to see him today the patient was sitting up in bed and was allowing his relative to feed him but was not speaking making eye contact or showing any other signs of alertness.  Family reports that he had been like that all day long.  They say this is typical after he has been giving haloperidol.  They understand that he has intermittent spells of agitation as well.  Their only concern with medication is that the Haldol may be over sedating and also that his  blood pressure seems to go low intermittently.  Patient himself unable to make any complaint.  Was not agitated at least with his family.  Primary issue at this point seems to be largely placement.  Past Psychiatric History: Past history of dementia although family has reported that the patient had a long history of anger and irritability but no specific psychiatric treatment  Risk to Self:   Risk to Others:   Prior Inpatient Therapy:   Prior Outpatient Therapy:    Past Medical History:  Past Medical History:  Diagnosis Date   Arthritis    Dementia (HCC)    per EMS   GERD (gastroesophageal reflux disease)    [name]   History of kidney stones    Hyperlipidemia    Macular degeneration    PVD (peripheral vascular disease) (HCC)    Stroke (HCC)    TIA      Past Surgical History:  Procedure Laterality Date   Bone turmor Left    foreheah   EYE SURGERY Bilateral    cataract removal   FRACTURE SURGERY Left    leg   pins   KNEE ARTHROPLASTY     KNEE SURGERY Left    NECK SURGERY     Tumor reomoved from head/neck in the 60s   REPLACEMENT TOTAL KNEE Left 07/14/2018   ROTATOR CUFF REPAIR Right    times 2   TOTAL KNEE ARTHROPLASTY WITH HARDWARE REMOVAL Left 02/14/2017   Procedure: Removal Left Tibia Nail, Cemented Total Knee  Arthroplasty;  Surgeon: Eldred Manges, MD;  Location: [facility] OR;  Service: Orthopedics;  Laterality: Left;   Family History:  Family History  Problem Relation Age of Onset   Stroke Neg Hx    Family Psychiatric  History: See previous Social History:  Social History   Substance and Sexual Activity  Alcohol Use Yes   Comment: Occasional beer     Social History   Substance and Sexual Activity  Drug Use No    Social History   Socioeconomic History   Marital status: Media planner    Spouse name: Not on file   Number of children: 9   Years of education: Not on file   Highest education level: Not on file  Occupational History   Occupation: Retired   Tobacco Use   Smoking status: Former    Packs/day: 1.50    Years: 20.00    Additional pack years: 0.00    Total pack years: 30.00    Types: Cigarettes   Smokeless tobacco: Never   Tobacco comments:    Quit 20+ year ago  Vaping Use   Vaping Use: Never used  Substance and Sexual Activity   Alcohol use: Yes    Comment: Occasional beer   Drug use: No   Sexual activity: Not Currently  Other Topics Concern   Not on file  Social History Narrative   Lives at home w/ his significant other   Right-handed   Caffeine: some coffee, 3 diet Mt Dew's per day   Social Determinants of Health   Financial Resource Strain: Low Risk  (07/23/2022)   Overall Financial Resource Strain (CARDIA)    Difficulty of Paying Living Expenses: Not hard at all  Food Insecurity: Patient Unable To Answer (09/07/2022)   Hunger Vital Sign    Worried About Running Out of Food in the Last Year: Patient unable to answer    Ran Out of Food in the Last Year: Patient unable to answer  Transportation Needs: Patient Unable To Answer (09/07/2022)   PRAPARE - Transportation    Lack of Transportation (Medical): Patient unable to answer    Lack of Transportation (Non-Medical): Patient unable to answer  Physical Activity: Insufficiently Active (07/23/2022)   Exercise Vital Sign    Days of Exercise per Week: 3 days    Minutes of Exercise per Session: 30 min  Stress: No Stress Concern Present (07/23/2022)   [employer] of Occupational Health - Occupational Stress Questionnaire    Feeling of Stress : Not at all  Social Connections: Socially Isolated (07/23/2022)   Social Connection and Isolation Panel [NHANES]    Frequency of Communication with Friends and Family: More than three times a week    Frequency of Social Gatherings with Friends and Family: Never    Attends Religious Services: Never    Database administrator or Organizations: No    Attends Banker Meetings: Never    Marital Status: Widowed    Additional Social History:    Allergies:   Allergies  Allergen Reactions   Celebrex [Celecoxib] Other (See Comments)    Leg swelling, broke out in rash    Labs: No results found for this or any previous visit (from the past 48 hour(s)).  Current Facility-Administered Medications  Medication Dose Route Frequency Provider Last Rate Last Admin   acetaminophen (TYLENOL) tablet 650 mg  650 mg Oral Q6H PRN Bennett, Christal H, NP   650 mg at 09/25/22 1448   cyanocobalamin (VITAMIN B12) tablet 1,000  mcg  1,000 mcg Oral Daily Lurene Shadow, MD   1,000 mcg at 09/27/22 0802   diphenhydrAMINE (BENADRYL) capsule 25 mg  25 mg Oral Once Foust, Katy L, NP       divalproex (DEPAKOTE SPRINKLE) capsule 250 mg  250 mg Oral Q12H Jaynie Bream, RPH   250 mg at 09/27/22 1023   donepezil (ARICEPT) tablet 5 mg  5 mg Oral Daily Cox, Amy N, DO   5 mg at 09/27/22 0803   enoxaparin (LOVENOX) injection 40 mg  40 mg Subcutaneous Q24H Cox, Amy N, DO   40 mg at 09/26/22 2148   feeding supplement (ENSURE ENLIVE / ENSURE PLUS) liquid 237 mL  237 mL Oral BID BM Cox, Amy N, DO   237 mL at 09/27/22 0802   haloperidol (HALDOL) tablet 5 mg  5 mg Oral BID PRN Charm Rings, NP   5 mg at 09/27/22 0025   Or   haloperidol lactate (HALDOL) injection 5 mg  5 mg Intramuscular BID PRN Charm Rings, NP   5 mg at 09/24/22 0217   pravastatin (PRAVACHOL) tablet 40 mg  40 mg Oral QHS Cox, Amy N, DO   40 mg at 09/26/22 2147   QUEtiapine (SEROQUEL) tablet 100 mg  100 mg Oral QHS Clapacs, John T, MD   100 mg at 09/26/22 2147   [START ON 09/28/2022] QUEtiapine (SEROQUEL) tablet 50 mg  50 mg Oral BID Charm Rings, NP       rOPINIRole (REQUIP) tablet 0.5 mg  0.5 mg Oral QHS Bennett, Christal H, NP   0.5 mg at 09/26/22 2147   tamsulosin (FLOMAX) capsule 0.4 mg  0.4 mg Oral QPC supper Cox, Amy N, DO   0.4 mg at 09/26/22 1743    Musculoskeletal: Strength & Muscle Tone: decreased Gait & Station: unsteady Patient leans:  N/A  Psychiatric Specialty Exam: Physical Exam Vitals and nursing note reviewed.  Constitutional:      Appearance: Normal appearance.  HENT:     Head: Normocephalic and atraumatic.     Nose: Nose normal.  Pulmonary:     Effort: Pulmonary effort is normal.  Musculoskeletal:        General: Normal range of motion.  Neurological:     General: No focal deficit present.     Mental Status: Mental status is at baseline.  Psychiatric:        Attention and Perception: He is inattentive.        Mood and Affect: Affect is flat.        Speech: He is noncommunicative.        Cognition and Memory: Cognition is impaired. Memory is impaired.        Judgment: Judgment is impulsive.     Review of Systems  Unable to perform ROS: Dementia  Psychiatric/Behavioral:  Positive for memory loss. The patient is nervous/anxious.   All other systems reviewed and are negative.   Blood pressure (!) 89/71, pulse (!) 51, temperature 97.7 F (36.5 C), resp. rate 17, height 5\' 10"  (1.778 m), weight 78.2 kg, SpO2 100 %.Body mass index is 24.74 kg/m.  General Appearance: Casual  Eye Contact:  Minimal  Speech:  NA  Volume:  He did not talk  Mood:  Anxious and Irritable  Affect:  Blunt  Thought Process:  UTA, unable to assess  Orientation:  Other:  person  Thought Content:  UTA  Suicidal Thoughts:  UTA  Homicidal Thoughts:  UTA  Memory:  UTA  Judgement:  Impaired  Insight:  UTA  Psychomotor Activity:  Increased  Concentration:  Concentration: Poor and Attention Span: Poor  Recall:  UTA  Fund of Knowledge:  UTA  Language:  Negative  Akathisia:  No  Handed:  Right  AIMS (if indicated):     Assets:  Leisure Time Resilience  ADL's:  Impaired  Cognition:  Impaired,  Moderate  Sleep:        Physical Exam: Physical Exam Vitals and nursing note reviewed.  Constitutional:      Appearance: Normal appearance.  HENT:     Head: Normocephalic and atraumatic.     Nose: Nose normal.  Pulmonary:      Effort: Pulmonary effort is normal.  Musculoskeletal:        General: Normal range of motion.  Neurological:     General: No focal deficit present.     Mental Status: Mental status is at baseline.  Psychiatric:        Attention and Perception: He is inattentive.        Mood and Affect: Affect is flat.        Speech: He is noncommunicative.        Cognition and Memory: Cognition is impaired. Memory is impaired.        Judgment: Judgment is impulsive.    Review of Systems  Unable to perform ROS: Dementia  Psychiatric/Behavioral:  Positive for memory loss. The patient is nervous/anxious.   All other systems reviewed and are negative.  Blood pressure (!) 89/71, pulse (!) 51, temperature 97.7 F (36.5 C), resp. rate 17, height 5\' 10"  (1.778 m), weight 78.2 kg, SpO2 100 %. Body mass index is 24.74 kg/m.  Treatment Plan Summary:  Dementia with behavioral disturbance: Seroquel 50 at 8 am and 2 pm increased from daily at 10 am and 100 mg at bedtime. Depakote 250 mg BID  Agitation:  haldol 5 mg PRN BID  Disposition: Supportive therapy provided about ongoing stressors.  He does not meet psychiatric hospitalization criteria, SNF Placement needed.  Nanine Means, NP 09/27/2022 5:03 PM

## 2022-09-27 NOTE — Progress Notes (Signed)
Patient agitated, grabbed sitter by the arm and twisted her arm and started to walk quickly towards the door.  Sitter and nurse redirected patient back to room and chair.  Patient given PRN haldol for agitation.  Patient also given a snack and coloring book.

## 2022-09-27 NOTE — Progress Notes (Signed)
Progress Note    Terry Sloan  ZOX:[id number] DOB: Jan 22, 1941  DOA: 08/08/2022 PCP: Pcp, No      Brief Narrative:    Medical records reviewed and are as summarized below:  Terry Sloan is a 82 y.o. male  with history of dementia, behavioral disturbance, hyperlipidemia, BPH, insomnia, depression, anxiety, who presents to emergency department from [facility] rehab dementia department on 08/08/2022 for chief concerns of aggression with aggressive outburst towards staff.   Patient was initially IVC.  Behavioral health was consulted and determined that patient was not a danger to self or others.  Patient was pending placement, however was declined due to as needed Haldol doses were given.   On day of admission, nursing noticed that patient's temperature was low, decrease responsiveness/change in mentation, and hypotension.      Assessment/Plan:   Principal Problem:   Dementia with behavioral disturbance (HCC) Active Problems:   Mixed hyperlipidemia   History of TIA (transient ischemic attack)   Anxiety and depression   BPH (benign prostatic hyperplasia)   OAB (overactive bladder)   Altered mental status   Hypothermia   Restless leg   Vitamin B12 deficiency   Body mass index is 24.74 kg/m.    Altered mental status With hypotension and hypothermia, now resolved..  Ammonia level was normal Continue supportive care.    Restless leg Continue Requip nightly   Hypothermia Resolved   Dementia with behavioral disturbance, intermittent agitation He was reevaluated by Dr. Toni Amend, psychiatrist, on 09/24/2022.  He was reevaluated by Valentino Hue, NP, with behavioral health team on 09/25/2022.  Continue Seroquel, Haldol as needed, Depakote, ropinirole and donepezil.   BPH (benign prostatic hyperplasia) Continue Flomax    Anxiety and depression Continue Seroquel   Vitamin B12 deficiency  B12 level was 167 on 09/06/2022.  Continue oral vitamin B12 supplement    Diet  Order             Diet regular Fluid consistency: Thin  Diet effective now                            Consultants: Psychiatrist  Procedures: None    Medications:    vitamin B-12  1,000 mcg Oral Daily   diphenhydrAMINE  25 mg Oral Once   divalproex  250 mg Oral Q12H   donepezil  5 mg Oral Daily   enoxaparin (LOVENOX) injection  40 mg Subcutaneous Q24H   feeding supplement  237 mL Oral BID BM   pravastatin  40 mg Oral QHS   QUEtiapine  100 mg Oral QHS   QUEtiapine  50 mg Oral Daily   rOPINIRole  0.5 mg Oral QHS   tamsulosin  0.4 mg Oral QPC supper   Continuous Infusions:   Anti-infectives (From admission, onward)    None              Family Communication/Anticipated D/C date and plan/Code Status   DVT prophylaxis: enoxaparin (LOVENOX) injection 40 mg Start: 09/06/22 2200 Place TED hose Start: 09/06/22 1609     Code Status: Full Code  Family Communication: None Disposition Plan: Plan to discharge to SNF   Status is: Inpatient Remains inpatient appropriate because: Awaiting placement to SNF       Subjective:   Interval events noted.  He cannot provide any history at this time.  He is sleepy and confused.  Sitter at the bedside.  Objective:    Vitals:  09/26/22 1231 09/26/22 1656 09/27/22 0841 09/27/22 0939  BP: 106/64 (!) 102/49 (!) 102/58 (!) 101/56  Pulse: 60 67 68 71  Resp: 17 18 18 18   Temp: 97.8 F (36.6 C) 98.1 F (36.7 C) 98.1 F (36.7 C) 97.9 F (36.6 C)  TempSrc:   Oral Oral  SpO2: 99% 100% 98% 97%  Weight:      Height:       No data found.   Intake/Output Summary (Last 24 hours) at 09/27/2022 1237 Last data filed at 09/27/2022 1610 Gross per 24 hour  Intake --  Output 1400 ml  Net -1400 ml   Filed Weights   08/08/22 1205 08/14/22 0038 09/07/22 0057  Weight: 81 kg 78.2 kg 78.2 kg    Exam:  GEN: NAD, sitting up in the chair but sleepy SKIN: Warm and dry EYES: No acute abnormality ENT: MMM CV:  RRR PULM: No wheezing or rales ABD: soft, ND, NT, +BS CNS: Sleepy EXT: No edema       Data Reviewed:   I have personally reviewed following labs and imaging studies:  Labs: Labs show the following:   Basic Metabolic Panel: Recent Labs  Lab 09/25/22 0531  NA 139  K 4.1  CL 107  CO2 23  GLUCOSE 94  BUN 27*  CREATININE 1.05  CALCIUM 8.7*    GFR Estimated Creatinine Clearance: 55 mL/min (by C-G formula based on SCr of 1.05 mg/dL). Liver Function Tests: No results for input(s): "AST", "ALT", "ALKPHOS", "BILITOT", "PROT", "ALBUMIN" in the last 168 hours. No results for input(s): "LIPASE", "AMYLASE" in the last 168 hours. No results for input(s): "AMMONIA" in the last 168 hours.  Coagulation profile No results for input(s): "INR", "PROTIME" in the last 168 hours.  CBC: Recent Labs  Lab 09/25/22 0531  WBC 5.7  NEUTROABS 3.5  HGB 11.7*  HCT 36.2*  MCV 96.0  PLT 280    Cardiac Enzymes: No results for input(s): "CKTOTAL", "CKMB", "CKMBINDEX", "TROPONINI" in the last 168 hours. BNP (last 3 results) No results for input(s): "PROBNP" in the last 8760 hours. CBG: No results for input(s): "GLUCAP" in the last 168 hours. D-Dimer: No results for input(s): "DDIMER" in the last 72 hours. Hgb A1c: No results for input(s): "HGBA1C" in the last 72 hours. Lipid Profile: No results for input(s): "CHOL", "HDL", "LDLCALC", "TRIG", "CHOLHDL", "LDLDIRECT" in the last 72 hours. Thyroid function studies: No results for input(s): "TSH", "T4TOTAL", "T3FREE", "THYROIDAB" in the last 72 hours.  Invalid input(s): "FREET3" Anemia work up: No results for input(s): "VITAMINB12", "FOLATE", "FERRITIN", "TIBC", "IRON", "RETICCTPCT" in the last 72 hours. Sepsis Labs: Recent Labs  Lab 09/25/22 0531  WBC 5.7     Microbiology No results found for this or any previous visit (from the past 240 hour(s)).   Procedures and diagnostic studies:  No results  found.             LOS: 21 days   Jayin Derousse  [employer]ered loss adjuster on [email or web address]. If 7PM-7AM, please contact night-coverage at [email or web address]     09/27/2022, 12:37 PM

## 2022-09-28 DIAGNOSIS — F03918 Unspecified dementia, unspecified severity, with other behavioral disturbance: Secondary | ICD-10-CM | POA: Diagnosis not present

## 2022-09-28 DIAGNOSIS — F419 Anxiety disorder, unspecified: Secondary | ICD-10-CM | POA: Diagnosis not present

## 2022-09-28 DIAGNOSIS — E538 Deficiency of other specified B group vitamins: Secondary | ICD-10-CM | POA: Diagnosis not present

## 2022-09-28 DIAGNOSIS — F32A Depression, unspecified: Secondary | ICD-10-CM | POA: Diagnosis not present

## 2022-09-28 MED ORDER — ONDANSETRON HCL 4 MG/2ML IJ SOLN: 4.0000 mg | Freq: Once | INTRAMUSCULAR | Status: DC

## 2022-09-28 NOTE — Plan of Care (Signed)
  Problem: Fluid Volume: Goal: Hemodynamic stability will improve Outcome: Progressing   Problem: Clinical Measurements: Goal: Signs and symptoms of infection will decrease Outcome: Progressing   Problem: Respiratory: Goal: Ability to maintain adequate ventilation will improve Outcome: Progressing   Problem: Clinical Measurements: Goal: Cardiovascular complication will be avoided Outcome: Progressing   Problem: Activity: Goal: Risk for activity intolerance will decrease Outcome: Progressing   Problem: Nutrition: Goal: Adequate nutrition will be maintained Outcome: Progressing   Problem: Coping: Goal: Level of anxiety will decrease Outcome: Progressing   Problem: Elimination: Goal: Will not experience complications related to bowel motility Outcome: Progressing Goal: Will not experience complications related to urinary retention Outcome: Progressing   Problem: Safety: Goal: Ability to remain free from injury will improve Outcome: Progressing   Problem: Skin Integrity: Goal: Risk for impaired skin integrity will decrease Outcome: Progressing

## 2022-09-28 NOTE — Consult Note (Signed)
[facility] Face-to-Face Psychiatry Consult   Reason for Consult: Follow-up 82 year old man with advanced dementia with worsening behavior issues Referring Physician:  Georgeann Oppenheim Patient Identification: Terry Sloan MRN:  [id number] Principal Diagnosis: Dementia with behavioral disturbance (HCC) Diagnosis:  Principal Problem:   Dementia with behavioral disturbance (HCC) Active Problems:   Mixed hyperlipidemia   History of TIA (transient ischemic attack)   Anxiety and depression   BPH (benign prostatic hyperplasia)   OAB (overactive bladder)   Altered mental status   Hypothermia   Restless leg   Vitamin B12 deficiency   Total Time spent with patient: 30 minutes.  Subjective:   Terry Sloan is a 82 y.o. male patient admitted with AMS.  4/27: The client was agitated late yesterday and required Haldol.  Today, he is sitting in a chair with the sitter at his side.  She reported he continues to be restless and refusing to lie down.  Apparently he wants to walk around, especially at night. His sleep was not well last night.  He did get a bath this morning without issues and ate "everything" at breakfast.  His Seroquel daytime dose was increased yesterday, will wait and see how his behaviors increases.  4/26: The client was seen today and was trying to ambulate without waiting for assistance.  The tech reported, when he is ready to stand, he will not wait.  No aggression.  He awakened last night at 2 am and did not return to sleep until during the day and was sleeping all morning. Seroquel changed to BID versus daily to try and assist afternoon agitation.  HPI per Dr Toni Amend on 4/23: 82 year old man with a history of advanced dementia who has been seen previously by the behavioral health team for agitation.  Has developed intermittent agitated behaviors it seems over the last couple months.  Reports that last night the patient became agitated in the late night hours had to be given as needed haloperidol.   When I came to see him today the patient was sitting up in bed and was allowing his relative to feed him but was not speaking making eye contact or showing any other signs of alertness.  Family reports that he had been like that all day long.  They say this is typical after he has been giving haloperidol.  They understand that he has intermittent spells of agitation as well.  Their only concern with medication is that the Haldol may be over sedating and also that his blood pressure seems to go low intermittently.  Patient himself unable to make any complaint.  Was not agitated at least with his family.  Primary issue at this point seems to be largely placement.  Past Psychiatric History: Past history of dementia although family has reported that the patient had a long history of anger and irritability but no specific psychiatric treatment  Risk to Self:   Risk to Others:   Prior Inpatient Therapy:   Prior Outpatient Therapy:    Past Medical History:  Past Medical History:  Diagnosis Date   Arthritis    Dementia (HCC)    per EMS   GERD (gastroesophageal reflux disease)    [name]   History of kidney stones    Hyperlipidemia    Macular degeneration    PVD (peripheral vascular disease) (HCC)    Stroke (HCC)    TIA      Past Surgical History:  Procedure Laterality Date   Bone turmor Left    foreheah  EYE SURGERY Bilateral    cataract removal   FRACTURE SURGERY Left    leg   pins   KNEE ARTHROPLASTY     KNEE SURGERY Left    NECK SURGERY     Tumor reomoved from head/neck in the 60s   REPLACEMENT TOTAL KNEE Left 07/14/2018   ROTATOR CUFF REPAIR Right    times 2   TOTAL KNEE ARTHROPLASTY WITH HARDWARE REMOVAL Left 02/14/2017   Procedure: Removal Left Tibia Nail, Cemented Total Knee Arthroplasty;  Surgeon: Eldred Manges, MD;  Location: [facility] OR;  Service: Orthopedics;  Laterality: Left;   Family History:  Family History  Problem Relation Age of Onset   Stroke Neg Hx    Family  Psychiatric  History: See previous Social History:  Social History   Substance and Sexual Activity  Alcohol Use Yes   Comment: Occasional beer     Social History   Substance and Sexual Activity  Drug Use No    Social History   Socioeconomic History   Marital status: Media planner    Spouse name: Not on file   Number of children: 9   Years of education: Not on file   Highest education level: Not on file  Occupational History   Occupation: Retired  Tobacco Use   Smoking status: Former    Packs/day: 1.50    Years: 20.00    Additional pack years: 0.00    Total pack years: 30.00    Types: Cigarettes   Smokeless tobacco: Never   Tobacco comments:    Quit 20+ year ago  Vaping Use   Vaping Use: Never used  Substance and Sexual Activity   Alcohol use: Yes    Comment: Occasional beer   Drug use: No   Sexual activity: Not Currently  Other Topics Concern   Not on file  Social History Narrative   Lives at home w/ his significant other   Right-handed   Caffeine: some coffee, 3 diet Mt Dew's per day   Social Determinants of Health   Financial Resource Strain: Low Risk  (07/23/2022)   Overall Financial Resource Strain (CARDIA)    Difficulty of Paying Living Expenses: Not hard at all  Food Insecurity: Patient Unable To Answer (09/07/2022)   Hunger Vital Sign    Worried About Running Out of Food in the Last Year: Patient unable to answer    Ran Out of Food in the Last Year: Patient unable to answer  Transportation Needs: Patient Unable To Answer (09/07/2022)   PRAPARE - Transportation    Lack of Transportation (Medical): Patient unable to answer    Lack of Transportation (Non-Medical): Patient unable to answer  Physical Activity: Insufficiently Active (07/23/2022)   Exercise Vital Sign    Days of Exercise per Week: 3 days    Minutes of Exercise per Session: 30 min  Stress: No Stress Concern Present (07/23/2022)   [employer] of Occupational Health - Occupational  Stress Questionnaire    Feeling of Stress : Not at all  Social Connections: Socially Isolated (07/23/2022)   Social Connection and Isolation Panel [NHANES]    Frequency of Communication with Friends and Family: More than three times a week    Frequency of Social Gatherings with Friends and Family: Never    Attends Religious Services: Never    Database administrator or Organizations: No    Attends Banker Meetings: Never    Marital Status: Widowed   Additional Social History:    Allergies:  Allergies  Allergen Reactions   Celebrex [Celecoxib] Other (See Comments)    Leg swelling, broke out in rash    Labs: No results found for this or any previous visit (from the past 48 hour(s)).  Current Facility-Administered Medications  Medication Dose Route Frequency Provider Last Rate Last Admin   acetaminophen (TYLENOL) tablet 650 mg  650 mg Oral Q6H PRN Bennett, Christal H, NP   650 mg at 09/25/22 1448   cyanocobalamin (VITAMIN B12) tablet 1,000 mcg  1,000 mcg Oral Daily Lurene Shadow, MD   1,000 mcg at 09/28/22 1103   diphenhydrAMINE (BENADRYL) capsule 25 mg  25 mg Oral Once Foust, Katy L, NP       divalproex (DEPAKOTE SPRINKLE) capsule 250 mg  250 mg Oral Q12H Jaynie Bream, RPH   250 mg at 09/28/22 1106   donepezil (ARICEPT) tablet 5 mg  5 mg Oral Daily Cox, Amy N, DO   5 mg at 09/28/22 1103   enoxaparin (LOVENOX) injection 40 mg  40 mg Subcutaneous Q24H Cox, Amy N, DO   40 mg at 09/27/22 2158   feeding supplement (ENSURE ENLIVE / ENSURE PLUS) liquid 237 mL  237 mL Oral BID BM Cox, Amy N, DO   237 mL at 09/28/22 1104   haloperidol (HALDOL) tablet 5 mg  5 mg Oral BID PRN Charm Rings, NP   5 mg at 09/27/22 1745   Or   haloperidol lactate (HALDOL) injection 5 mg  5 mg Intramuscular BID PRN Charm Rings, NP   5 mg at 09/24/22 0217   pravastatin (PRAVACHOL) tablet 40 mg  40 mg Oral QHS Cox, Amy N, DO   40 mg at 09/27/22 2157   QUEtiapine (SEROQUEL) tablet 100 mg  100  mg Oral QHS Clapacs, John T, MD   100 mg at 09/27/22 2158   QUEtiapine (SEROQUEL) tablet 50 mg  50 mg Oral BID Charm Rings, NP   50 mg at 09/28/22 1103   rOPINIRole (REQUIP) tablet 0.5 mg  0.5 mg Oral QHS Bennett, Christal H, NP   0.5 mg at 09/27/22 2157   tamsulosin (FLOMAX) capsule 0.4 mg  0.4 mg Oral QPC supper Cox, Amy N, DO   0.4 mg at 09/27/22 1745    Musculoskeletal: Strength & Muscle Tone: decreased Gait & Station: unsteady Patient leans: N/A  Psychiatric Specialty Exam: Physical Exam Vitals and nursing note reviewed.  Constitutional:      Appearance: Normal appearance.  HENT:     Head: Normocephalic and atraumatic.     Nose: Nose normal.  Pulmonary:     Effort: Pulmonary effort is normal.  Musculoskeletal:        General: Normal range of motion.  Neurological:     General: No focal deficit present.     Mental Status: Mental status is at baseline.  Psychiatric:        Attention and Perception: He is inattentive.        Mood and Affect: Affect is flat.        Speech: He is noncommunicative.        Cognition and Memory: Cognition is impaired. Memory is impaired.        Judgment: Judgment is impulsive.     Review of Systems  Unable to perform ROS: Dementia  Psychiatric/Behavioral:  Positive for memory loss. The patient is nervous/anxious.   All other systems reviewed and are negative.   Blood pressure 121/76, pulse 80, temperature 98.8 F (37.1 C), resp.  rate 16, height 5\' 10"  (1.778 m), weight 78.2 kg, SpO2 94 %.Body mass index is 24.74 kg/m.  General Appearance: Casual  Eye Contact:  Minimal  Speech:  NA  Volume:  He did not talk  Mood:  Anxious and Irritable  Affect:  Blunt  Thought Process:  UTA, unable to assess  Orientation:  Other:  person  Thought Content:  UTA  Suicidal Thoughts:  UTA  Homicidal Thoughts:  UTA  Memory:  UTA  Judgement:  Impaired  Insight:  UTA  Psychomotor Activity:  Increased  Concentration:  Concentration: Poor and  Attention Span: Poor  Recall:  [employer] of Knowledge:  UTA  Language:  Negative  Akathisia:  No  Handed:  Right  AIMS (if indicated):     Assets:  Leisure Time Resilience  ADL's:  Impaired  Cognition:  Impaired,  Moderate  Sleep:        Physical Exam: Physical Exam Vitals and nursing note reviewed.  Constitutional:      Appearance: Normal appearance.  HENT:     Head: Normocephalic and atraumatic.     Nose: Nose normal.  Pulmonary:     Effort: Pulmonary effort is normal.  Musculoskeletal:        General: Normal range of motion.  Neurological:     General: No focal deficit present.     Mental Status: Mental status is at baseline.  Psychiatric:        Attention and Perception: He is inattentive.        Mood and Affect: Affect is flat.        Speech: He is noncommunicative.        Cognition and Memory: Cognition is impaired. Memory is impaired.        Judgment: Judgment is impulsive.    Review of Systems  Unable to perform ROS: Dementia  Psychiatric/Behavioral:  Positive for memory loss. The patient is nervous/anxious.   All other systems reviewed and are negative.  Blood pressure 121/76, pulse 80, temperature 98.8 F (37.1 C), resp. rate 16, height 5\' 10"  (1.778 m), weight 78.2 kg, SpO2 94 %. Body mass index is 24.74 kg/m.  Treatment Plan Summary:  Dementia with behavioral disturbance: Seroquel 50 at 8 am and 2 pm increased from daily at 10 am and 100 mg at bedtime. Depakote 250 mg BID  Agitation:  haldol 5 mg PRN BID  Disposition: Supportive therapy provided about ongoing stressors.  He does not meet psychiatric hospitalization criteria, SNF Placement needed.  Nanine Means, NP 09/28/2022 12:30 PM

## 2022-09-28 NOTE — Progress Notes (Signed)
Mobility Specialist - Progress Note   09/28/22 1017  Mobility  Activity Ambulated with assistance in hallway;Stood at bedside;Dangled on edge of bed  Level of Assistance Standby assist, set-up cues, supervision of patient - no hands on  Assistive Device Front wheel walker  Distance Ambulated (ft) 160 ft  Activity Response Tolerated well  Mobility Referral Yes  $Mobility charge 1 Mobility   Pt sitting EOB on RA upon arrival. Pt STS and ambulates in hallway SBA. Pt needs VC for proper hand placement on RW and safety awareness throughout ambulation. Pt returns to recliner with needs in reach and sitter present.   Terrilyn Saver  Mobility Specialist  09/28/22 10:20 AM

## 2022-09-28 NOTE — Progress Notes (Signed)
       CROSS COVER NOTE  NAME: Terry Sloan MRN: [id number] DOB : 1940-09-05    HPI/Events of Note   Report:patient with increased upper airway respiratory secretions, cough, nausea, and body ache that she reports as new  On review of chart:admitted with altered mental status and hypothermia. Covid and rsv negative at that time 4/6    Assessment and  Interventions   Assessment: Afebrile soft pressure 107/51 Plan: Rep panel by PCR Zofran dose for nausea Acetaminophen given for headache/bodyaches Contact/droplet precautions       Donnie Mesa NP [facility]   + parainfluenza 3 virus - continue supportive care of symptoms and contact/droplet precautions

## 2022-09-28 NOTE — Progress Notes (Signed)
Progress Note    Terry Sloan  ZOX:[id number] DOB: August 15, 1940  DOA: 08/08/2022 PCP: Pcp, No      Brief Narrative:    Medical records reviewed and are as summarized below:  Terry Sloan is a 82 y.o. male  with history of dementia, behavioral disturbance, hyperlipidemia, BPH, insomnia, depression, anxiety, who presents to emergency department from [facility] rehab dementia department on 08/08/2022 for chief concerns of aggression with aggressive outburst towards staff.   Patient was initially IVC.  Behavioral health was consulted and determined that patient was not a danger to self or others.  Patient was pending placement, however was declined due to as needed Haldol doses were given.   On day of admission, nursing noticed that patient's temperature was low, decrease responsiveness/change in mentation, and hypotension.      Assessment/Plan:   Principal Problem:   Dementia with behavioral disturbance (HCC) Active Problems:   Mixed hyperlipidemia   History of TIA (transient ischemic attack)   Anxiety and depression   BPH (benign prostatic hyperplasia)   OAB (overactive bladder)   Altered mental status   Hypothermia   Restless leg   Vitamin B12 deficiency   Body mass index is 24.74 kg/m.    Altered mental status With hypotension and hypothermia, now resolved..  Ammonia level was normal Continue supportive care.    Restless leg Continue Requip nightly   Hypothermia Resolved   Dementia with behavioral disturbance, intermittent agitation Continue antipsychotics.   BPH (benign prostatic hyperplasia) Continue Flomax    Anxiety and depression Continue Seroquel   Vitamin B12 deficiency  B12 level was 167 on 09/06/2022.  Continue oral vitamin B12 supplement    Diet Order             Diet regular Fluid consistency: Thin  Diet effective now                            Consultants: Psychiatrist  Procedures: None    Medications:     vitamin B-12  1,000 mcg Oral Daily   diphenhydrAMINE  25 mg Oral Once   divalproex  250 mg Oral Q12H   donepezil  5 mg Oral Daily   enoxaparin (LOVENOX) injection  40 mg Subcutaneous Q24H   feeding supplement  237 mL Oral BID BM   pravastatin  40 mg Oral QHS   QUEtiapine  100 mg Oral QHS   QUEtiapine  50 mg Oral BID   rOPINIRole  0.5 mg Oral QHS   tamsulosin  0.4 mg Oral QPC supper   Continuous Infusions:   Anti-infectives (From admission, onward)    None              Family Communication/Anticipated D/C date and plan/Code Status   DVT prophylaxis: enoxaparin (LOVENOX) injection 40 mg Start: 09/06/22 2200 Place TED hose Start: 09/06/22 1609     Code Status: Full Code  Family Communication: None Disposition Plan: Plan to discharge to SNF   Status is: Inpatient Remains inpatient appropriate because: Awaiting placement to SNF       Subjective:   He has no complaints.  He was having breakfast when I walked into his room.  Sitter at the bedside.  Objective:    Vitals:   09/27/22 1554 09/27/22 2011 09/28/22 0410 09/28/22 0807  BP: (!) 89/71 116/70 (!) 152/54 121/76  Pulse: (!) 51 80 (!) 57 80  Resp: 17 18 16    Temp: 97.7  F (36.5 C) 98 F (36.7 C) 97.7 F (36.5 C) 98.8 F (37.1 C)  TempSrc:   Oral   SpO2: 100% 100% 100% 94%  Weight:      Height:       No data found.   Intake/Output Summary (Last 24 hours) at 09/28/2022 0927 Last data filed at 09/28/2022 0646 Gross per 24 hour  Intake 680 ml  Output 900 ml  Net -220 ml   Filed Weights   08/08/22 1205 08/14/22 0038 09/07/22 0057  Weight: 81 kg 78.2 kg 78.2 kg    Exam:  GEN: NAD SKIN: Warm and dry EYES: No pallor or icterus ENT: MMM CV: RRR PULM: CTA B ABD: soft, ND, NT, +BS CNS: Alert but confused, non focal EXT: No edema or tenderness Psych: Poor insight and judgment    Data Reviewed:   I have personally reviewed following labs and imaging studies:  Labs: Labs show the  following:   Basic Metabolic Panel: Recent Labs  Lab 09/25/22 0531  NA 139  K 4.1  CL 107  CO2 23  GLUCOSE 94  BUN 27*  CREATININE 1.05  CALCIUM 8.7*    GFR Estimated Creatinine Clearance: 55 mL/min (by C-G formula based on SCr of 1.05 mg/dL). Liver Function Tests: No results for input(s): "AST", "ALT", "ALKPHOS", "BILITOT", "PROT", "ALBUMIN" in the last 168 hours. No results for input(s): "LIPASE", "AMYLASE" in the last 168 hours. No results for input(s): "AMMONIA" in the last 168 hours.  Coagulation profile No results for input(s): "INR", "PROTIME" in the last 168 hours.  CBC: Recent Labs  Lab 09/25/22 0531  WBC 5.7  NEUTROABS 3.5  HGB 11.7*  HCT 36.2*  MCV 96.0  PLT 280    Cardiac Enzymes: No results for input(s): "CKTOTAL", "CKMB", "CKMBINDEX", "TROPONINI" in the last 168 hours. BNP (last 3 results) No results for input(s): "PROBNP" in the last 8760 hours. CBG: No results for input(s): "GLUCAP" in the last 168 hours. D-Dimer: No results for input(s): "DDIMER" in the last 72 hours. Hgb A1c: No results for input(s): "HGBA1C" in the last 72 hours. Lipid Profile: No results for input(s): "CHOL", "HDL", "LDLCALC", "TRIG", "CHOLHDL", "LDLDIRECT" in the last 72 hours. Thyroid function studies: No results for input(s): "TSH", "T4TOTAL", "T3FREE", "THYROIDAB" in the last 72 hours.  Invalid input(s): "FREET3" Anemia work up: No results for input(s): "VITAMINB12", "FOLATE", "FERRITIN", "TIBC", "IRON", "RETICCTPCT" in the last 72 hours. Sepsis Labs: Recent Labs  Lab 09/25/22 0531  WBC 5.7     Microbiology No results found for this or any previous visit (from the past 240 hour(s)).   Procedures and diagnostic studies:  No results found.             LOS: 22 days   Cami Delawder  [facility] [employer] loss adjuster on [email or web address]. If 7PM-7AM, please contact night-coverage at [email or web address]     09/28/2022, 9:27 AM

## 2022-09-29 DIAGNOSIS — F32A Depression, unspecified: Secondary | ICD-10-CM | POA: Diagnosis not present

## 2022-09-29 DIAGNOSIS — B348 Other viral infections of unspecified site: Secondary | ICD-10-CM | POA: Diagnosis not present

## 2022-09-29 DIAGNOSIS — F03918 Unspecified dementia, unspecified severity, with other behavioral disturbance: Secondary | ICD-10-CM | POA: Diagnosis not present

## 2022-09-29 DIAGNOSIS — F419 Anxiety disorder, unspecified: Secondary | ICD-10-CM | POA: Diagnosis not present

## 2022-09-29 DIAGNOSIS — E538 Deficiency of other specified B group vitamins: Secondary | ICD-10-CM | POA: Diagnosis not present

## 2022-09-29 LAB — RESPIRATORY PANEL BY PCR

## 2022-09-29 MED ORDER — QUETIAPINE FUMARATE 100 MG PO TABS
100.0000 mg | ORAL_TABLET | Freq: Three times a day (TID) | ORAL | Status: DC
  Administered 2022-09-29 – 2022-10-28 (×86): 100 mg via ORAL
  Filled 2022-09-29 (×2): qty 4
  Filled 2022-09-29: qty 1
  Filled 2022-09-29 (×3): qty 4
  Filled 2022-09-29 (×4): qty 1
  Filled 2022-09-29: qty 4
  Filled 2022-09-29 (×10): qty 1
  Filled 2022-09-29: qty 4
  Filled 2022-09-29 (×3): qty 1
  Filled 2022-09-29: qty 4
  Filled 2022-09-29 (×8): qty 1
  Filled 2022-09-29 (×5): qty 4
  Filled 2022-09-29 (×6): qty 1
  Filled 2022-09-29: qty 4
  Filled 2022-09-29: qty 1
  Filled 2022-09-29: qty 4
  Filled 2022-09-29 (×3): qty 1
  Filled 2022-09-29: qty 4
  Filled 2022-09-29 (×2): qty 1
  Filled 2022-09-29: qty 4
  Filled 2022-09-29 (×3): qty 1
  Filled 2022-09-29 (×2): qty 4
  Filled 2022-09-29 (×4): qty 1
  Filled 2022-09-29: qty 4
  Filled 2022-09-29 (×2): qty 1
  Filled 2022-09-29: qty 4
  Filled 2022-09-29 (×5): qty 1
  Filled 2022-09-29 (×3): qty 4
  Filled 2022-09-29 (×2): qty 1
  Filled 2022-09-29: qty 4
  Filled 2022-09-29: qty 1
  Filled 2022-09-29: qty 4
  Filled 2022-09-29: qty 1
  Filled 2022-09-29: qty 4
  Filled 2022-09-29: qty 1
  Filled 2022-09-29: qty 4
  Filled 2022-09-29 (×2): qty 1
  Filled 2022-09-29 (×2): qty 4
  Filled 2022-09-29 (×2): qty 1
  Filled 2022-09-29 (×4): qty 4
  Filled 2022-09-29: qty 1
  Filled 2022-09-29 (×3): qty 4
  Filled 2022-09-29: qty 1
  Filled 2022-09-29: qty 4
  Filled 2022-09-29 (×5): qty 1
  Filled 2022-09-29: qty 4
  Filled 2022-09-29 (×4): qty 1
  Filled 2022-09-29 (×5): qty 4
  Filled 2022-09-29 (×2): qty 1
  Filled 2022-09-29: qty 4
  Filled 2022-09-29: qty 1
  Filled 2022-09-29 (×2): qty 4
  Filled 2022-09-29 (×5): qty 1
  Filled 2022-09-29: qty 4
  Filled 2022-09-29 (×2): qty 1
  Filled 2022-09-29: qty 4
  Filled 2022-09-29: qty 1
  Filled 2022-09-29 (×2): qty 4
  Filled 2022-09-29 (×2): qty 1
  Filled 2022-09-29 (×2): qty 4

## 2022-09-29 MED ORDER — GUAIFENESIN-DM 100-10 MG/5ML PO SYRP: 5.0000 mL | ORAL_SOLUTION | ORAL | Status: DC | PRN

## 2022-09-29 MED ORDER — DIVALPROEX SODIUM 125 MG PO CSDR
250.0000 mg | DELAYED_RELEASE_CAPSULE | Freq: Two times a day (BID) | ORAL | Status: DC
  Filled 2022-09-29: qty 2

## 2022-09-29 MED ORDER — DIVALPROEX SODIUM 125 MG PO CSDR
500.0000 mg | DELAYED_RELEASE_CAPSULE | Freq: Two times a day (BID) | ORAL | Status: DC
  Administered 2022-09-29 – 2022-10-28 (×57): 500 mg via ORAL
  Filled 2022-09-29 (×61): qty 4

## 2022-09-29 MED ORDER — DIVALPROEX SODIUM 500 MG PO DR TAB
500.0000 mg | DELAYED_RELEASE_TABLET | Freq: Two times a day (BID) | ORAL | Status: DC
  Filled 2022-09-29: qty 1

## 2022-09-29 MED ORDER — GUAIFENESIN-DM 100-10 MG/5ML PO SYRP
5.0000 mL | ORAL_SOLUTION | Freq: Four times a day (QID) | ORAL | Status: DC | PRN
  Administered 2022-09-29 – 2022-10-02 (×4): 5 mL via ORAL
  Filled 2022-09-29 (×4): qty 10

## 2022-09-29 NOTE — Plan of Care (Signed)
  Problem: Respiratory: Goal: Ability to maintain adequate ventilation will improve Outcome: Progressing   Problem: Clinical Measurements: Goal: Respiratory complications will improve Outcome: Progressing Goal: Cardiovascular complication will be avoided Outcome: Progressing   Problem: Activity: Goal: Risk for activity intolerance will decrease Outcome: Progressing   Problem: Nutrition: Goal: Adequate nutrition will be maintained Outcome: Progressing   Problem: Coping: Goal: Level of anxiety will decrease Outcome: Progressing   Problem: Elimination: Goal: Will not experience complications related to bowel motility Outcome: Progressing Goal: Will not experience complications related to urinary retention Outcome: Progressing   Problem: Pain Managment: Goal: General experience of comfort will improve Outcome: Progressing   Problem: Safety: Goal: Ability to remain free from injury will improve Outcome: Progressing   Problem: Skin Integrity: Goal: Risk for impaired skin integrity will decrease Outcome: Progressing

## 2022-09-29 NOTE — Progress Notes (Addendum)
Progress Note    Terry Sloan  ZOX:[id number] DOB: [birth year]  DOA: 08/08/2022 PCP: Pcp, No      Brief Narrative:    Medical records reviewed and are as summarized below:  Terry Sloan is a 82 y.o. male  with history of dementia, behavioral disturbance, hyperlipidemia, BPH, insomnia, depression, anxiety, who presents to emergency department from [facility] rehab dementia department on 08/08/2022 for chief concerns of aggression with aggressive outburst towards staff.   Patient was initially IVC.  Behavioral health was consulted and determined that patient was not a danger to self or others.  Patient was pending placement, however was declined due to as needed Haldol doses were given.   On day of admission, nursing noticed that patient's temperature was low, decrease responsiveness/change in mentation, and hypotension.      Assessment/Plan:   Principal Problem:   Dementia with behavioral disturbance (HCC) Active Problems:   Mixed hyperlipidemia   History of TIA (transient ischemic attack)   Anxiety and depression   BPH (benign prostatic hyperplasia)   OAB (overactive bladder)   Altered mental status   Hypothermia   Restless leg   Vitamin B12 deficiency   Parainfluenza infection   Body mass index is 24.74 kg/m.   Upper respiratory tract infection/parainfluenza infection Robitussin as needed for cough.   Altered mental status With hypotension and hypothermia, now resolved..  Ammonia level was normal Continue supportive care.    Restless leg Continue Requip at night   Hypothermia Resolved   Dementia with behavioral disturbance, intermittent agitation Continue psychotropics.  He is on Seroquel, Depakote, donepezil and Haldol as needed.  Seroquel has been increased to 100 mg 3 times daily and Depakote increased to 500 mg twice daily by psychiatrist.   BPH (benign prostatic hyperplasia) Continue Flomax    Anxiety and depression Continue  Seroquel   Vitamin B12 deficiency  B12 level was 167 on 09/06/2022.  Continue oral vitamin B12 supplement    Diet Order             Diet regular Fluid consistency: Thin  Diet effective now                            Consultants: Psychiatrist  Procedures: None    Medications:    vitamin B-12  1,000 mcg Oral Daily   diphenhydrAMINE  25 mg Oral Once   divalproex  500 mg Oral Q12H   donepezil  5 mg Oral Daily   enoxaparin (LOVENOX) injection  40 mg Subcutaneous Q24H   feeding supplement  237 mL Oral BID BM   ondansetron (ZOFRAN) IV  4 mg Intravenous Once   pravastatin  40 mg Oral QHS   QUEtiapine  100 mg Oral TID   rOPINIRole  0.5 mg Oral QHS   tamsulosin  0.4 mg Oral QPC supper   Continuous Infusions:   Anti-infectives (From admission, onward)    None              Family Communication/Anticipated D/C date and plan/Code Status   DVT prophylaxis: enoxaparin (LOVENOX) injection 40 mg Start: 09/06/22 2200 Place TED hose Start: 09/06/22 1609     Code Status: Full Code  Family Communication: None Disposition Plan: Plan to discharge to SNF   Status is: Inpatient Remains inpatient appropriate because: Awaiting placement to SNF       Subjective:   Interval events noted.  Patient has a cough and runny nose.  Sitter at the bedside.  Objective:    Vitals:   09/28/22 0807 09/28/22 1500 09/28/22 1919 09/29/22 0539  BP: 121/76 114/74 (!) 107/51 123/86  Pulse: 80 74 80 71  Resp:  18 18 20   Temp: 98.8 F (37.1 C) 97.9 F (36.6 C) 98.7 F (37.1 C) 98.1 F (36.7 C)  TempSrc:  Axillary    SpO2: 94% 98% 99% 97%  Weight:      Height:       No data found.   Intake/Output Summary (Last 24 hours) at 09/29/2022 1147 Last data filed at 09/28/2022 2030 Gross per 24 hour  Intake 240 ml  Output --  Net 240 ml   Filed Weights   08/08/22 1205 08/14/22 0038 09/07/22 0057  Weight: 81 kg 78.2 kg 78.2 kg    Exam:  GEN: NAD SKIN:  Warm and dry EYES: EOMI ENT: MMM CV: RRR PULM: CTA B ABD: soft, ND, NT, +BS CNS: AAO x 3, non focal EXT: No edema or tenderness PSYCH: Poor insight and judgment.  Calm and cooperative at the time of my visit    Data Reviewed:   I have personally reviewed following labs and imaging studies:  Labs: Labs show the following:   Basic Metabolic Panel: Recent Labs  Lab 09/25/22 0531  NA 139  K 4.1  CL 107  CO2 23  GLUCOSE 94  BUN 27*  CREATININE 1.05  CALCIUM 8.7*    GFR Estimated Creatinine Clearance: 55 mL/min (by C-G formula based on SCr of 1.05 mg/dL). Liver Function Tests: No results for input(s): "AST", "ALT", "ALKPHOS", "BILITOT", "PROT", "ALBUMIN" in the last 168 hours. No results for input(s): "LIPASE", "AMYLASE" in the last 168 hours. No results for input(s): "AMMONIA" in the last 168 hours.  Coagulation profile No results for input(s): "INR", "PROTIME" in the last 168 hours.  CBC: Recent Labs  Lab 09/25/22 0531  WBC 5.7  NEUTROABS 3.5  HGB 11.7*  HCT 36.2*  MCV 96.0  PLT 280    Cardiac Enzymes: No results for input(s): "CKTOTAL", "CKMB", "CKMBINDEX", "TROPONINI" in the last 168 hours. BNP (last 3 results) No results for input(s): "PROBNP" in the last 8760 hours. CBG: No results for input(s): "GLUCAP" in the last 168 hours. D-Dimer: No results for input(s): "DDIMER" in the last 72 hours. Hgb A1c: No results for input(s): "HGBA1C" in the last 72 hours. Lipid Profile: No results for input(s): "CHOL", "HDL", "LDLCALC", "TRIG", "CHOLHDL", "LDLDIRECT" in the last 72 hours. Thyroid function studies: No results for input(s): "TSH", "T4TOTAL", "T3FREE", "THYROIDAB" in the last 72 hours.  Invalid input(s): "FREET3" Anemia work up: No results for input(s): "VITAMINB12", "FOLATE", "FERRITIN", "TIBC", "IRON", "RETICCTPCT" in the last 72 hours. Sepsis Labs: Recent Labs  Lab 09/25/22 0531  WBC 5.7     Microbiology Recent Results (from the past  240 hour(s))  Respiratory (~20 pathogens) panel by PCR     Status: Abnormal   Collection Time: 09/28/22 10:40 PM   Specimen: Nasopharyngeal Swab; Respiratory  Result Value Ref Range Status   Adenovirus NOT DETECTED NOT DETECTED Final   Coronavirus 229E NOT DETECTED NOT DETECTED Final    Comment: (NOTE) The Coronavirus on the Respiratory Panel, DOES NOT test for the novel  Coronavirus (2019 nCoV)    Coronavirus HKU1 NOT DETECTED NOT DETECTED Final   Coronavirus NL63 NOT DETECTED NOT DETECTED Final   Coronavirus OC43 NOT DETECTED NOT DETECTED Final   Metapneumovirus NOT DETECTED NOT DETECTED Final   Rhinovirus / Enterovirus NOT  DETECTED NOT DETECTED Final   Influenza A NOT DETECTED NOT DETECTED Final   Influenza B NOT DETECTED NOT DETECTED Final   Parainfluenza Virus 1 NOT DETECTED NOT DETECTED Final   Parainfluenza Virus 2 NOT DETECTED NOT DETECTED Final   Parainfluenza Virus 3 DETECTED (A) NOT DETECTED Final   Parainfluenza Virus 4 NOT DETECTED NOT DETECTED Final   Respiratory Syncytial Virus NOT DETECTED NOT DETECTED Final   Bordetella pertussis NOT DETECTED NOT DETECTED Final   Bordetella Parapertussis NOT DETECTED NOT DETECTED Final   Chlamydophila pneumoniae NOT DETECTED NOT DETECTED Final   Mycoplasma pneumoniae NOT DETECTED NOT DETECTED Final    Comment: Performed at [facility], [address] [place], [place] [address]     Procedures and diagnostic studies:  No results found.             LOS: 23 days   Nakima Fluegge  [employer] loss adjuster on [email or web address]. If 7PM-7AM, please contact night-coverage at [email or web address]     09/29/2022, 11:47 AM

## 2022-09-29 NOTE — Consult Note (Signed)
[facility] Face-to-Face Psychiatry Consult   Reason for Consult: Follow-up 82 year old man with advanced dementia with worsening behavior issues Referring Physician:  Georgeann Oppenheim Patient Identification: Terry Sloan MRN:  [id number] Principal Diagnosis: Dementia with behavioral disturbance (HCC) Diagnosis:  Principal Problem:   Dementia with behavioral disturbance (HCC) Active Problems:   Mixed hyperlipidemia   History of TIA (transient ischemic attack)   Anxiety and depression   BPH (benign prostatic hyperplasia)   OAB (overactive bladder)   Altered mental status   Hypothermia   Restless leg   Vitamin B12 deficiency   Total Time spent with patient: 30 minutes.  Subjective:   Terry Sloan is a 82 y.o. male patient admitted with AMS.  Client diagnosed with the flu and despite the sitter ambulating him prior to the assessment, he is agitated and wanting to pace around the room, unfortunately, he is a fall risk.  Agitation medications in place and meds adjusted, see below.  4/27: The client was agitated late yesterday and required Haldol.  Today, he is sitting in a chair with the sitter at his side.  She reported he continues to be restless and refusing to lie down.  Apparently he wants to walk around, especially at night. His sleep was not well last night.  He did get a bath this morning without issues and ate "everything" at breakfast.  His Seroquel daytime dose was increased yesterday, will wait and see how his behaviors increases.  4/26: The client was seen today and was trying to ambulate without waiting for assistance.  The tech reported, when he is ready to stand, he will not wait.  No aggression.  He awakened last night at 2 am and did not return to sleep until during the day and was sleeping all morning. Seroquel changed to BID versus daily to try and assist afternoon agitation.  HPI per Dr Toni Amend on 4/23: 82 year old man with a history of advanced dementia who has been seen previously by  the behavioral health team for agitation.  Has developed intermittent agitated behaviors it seems over the last couple months.  Reports that last night the patient became agitated in the late night hours had to be given as needed haloperidol.  When I came to see him today the patient was sitting up in bed and was allowing his relative to feed him but was not speaking making eye contact or showing any other signs of alertness.  Family reports that he had been like that all day long.  They say this is typical after he has been giving haloperidol.  They understand that he has intermittent spells of agitation as well.  Their only concern with medication is that the Haldol may be over sedating and also that his blood pressure seems to go low intermittently.  Patient himself unable to make any complaint.  Was not agitated at least with his family.  Primary issue at this point seems to be largely placement.  Past Psychiatric History: Past history of dementia although family has reported that the patient had a long history of anger and irritability but no specific psychiatric treatment  Risk to Self:   Risk to Others:   Prior Inpatient Therapy:   Prior Outpatient Therapy:    Past Medical History:  Past Medical History:  Diagnosis Date   Arthritis    Dementia (HCC)    per EMS   GERD (gastroesophageal reflux disease)    [name]   History of kidney stones    Hyperlipidemia  Macular degeneration    PVD (peripheral vascular disease) (HCC)    Stroke (HCC)    TIA      Past Surgical History:  Procedure Laterality Date   Bone turmor Left    foreheah   EYE SURGERY Bilateral    cataract removal   FRACTURE SURGERY Left    leg   pins   KNEE ARTHROPLASTY     KNEE SURGERY Left    NECK SURGERY     Tumor reomoved from head/neck in the 60s   REPLACEMENT TOTAL KNEE Left 07/14/2018   ROTATOR CUFF REPAIR Right    times 2   TOTAL KNEE ARTHROPLASTY WITH HARDWARE REMOVAL Left 02/14/2017   Procedure: Removal  Left Tibia Nail, Cemented Total Knee Arthroplasty;  Surgeon: Eldred Manges, MD;  Location: [facility] OR;  Service: Orthopedics;  Laterality: Left;   Family History:  Family History  Problem Relation Age of Onset   Stroke Neg Hx    Family Psychiatric  History: See previous Social History:  Social History   Substance and Sexual Activity  Alcohol Use Yes   Comment: Occasional beer     Social History   Substance and Sexual Activity  Drug Use No    Social History   Socioeconomic History   Marital status: Media planner    Spouse name: Not on file   Number of children: 9   Years of education: Not on file   Highest education level: Not on file  Occupational History   Occupation: Retired  Tobacco Use   Smoking status: Former    Packs/day: 1.50    Years: 20.00    Additional pack years: 0.00    Total pack years: 30.00    Types: Cigarettes   Smokeless tobacco: Never   Tobacco comments:    Quit 20+ year ago  Vaping Use   Vaping Use: Never used  Substance and Sexual Activity   Alcohol use: Yes    Comment: Occasional beer   Drug use: No   Sexual activity: Not Currently  Other Topics Concern   Not on file  Social History Narrative   Lives at home w/ his significant other   Right-handed   Caffeine: some coffee, 3 diet Mt Dew's per day   Social Determinants of Health   Financial Resource Strain: Low Risk  (07/23/2022)   Overall Financial Resource Strain (CARDIA)    Difficulty of Paying Living Expenses: Not hard at all  Food Insecurity: Patient Unable To Answer (09/07/2022)   Hunger Vital Sign    Worried About Running Out of Food in the Last Year: Patient unable to answer    Ran Out of Food in the Last Year: Patient unable to answer  Transportation Needs: Patient Unable To Answer (09/07/2022)   PRAPARE - Transportation    Lack of Transportation (Medical): Patient unable to answer    Lack of Transportation (Non-Medical): Patient unable to answer  Physical Activity:  Insufficiently Active (07/23/2022)   Exercise Vital Sign    Days of Exercise per Week: 3 days    Minutes of Exercise per Session: 30 min  Stress: No Stress Concern Present (07/23/2022)   [employer] of Occupational Health - Occupational Stress Questionnaire    Feeling of Stress : Not at all  Social Connections: Socially Isolated (07/23/2022)   Social Connection and Isolation Panel [NHANES]    Frequency of Communication with Friends and Family: More than three times a week    Frequency of Social Gatherings with Friends and Family:  Never    Attends Religious Services: Never    Active Member of Clubs or Organizations: No    Attends Banker Meetings: Never    Marital Status: Widowed   Additional Social History:    Allergies:   Allergies  Allergen Reactions   Celebrex [Celecoxib] Other (See Comments)    Leg swelling, broke out in rash    Labs:  Results for orders placed or performed during the hospital encounter of 08/08/22 (from the past 48 hour(s))  Respiratory (~20 pathogens) panel by PCR     Status: Abnormal   Collection Time: 09/28/22 10:40 PM   Specimen: Nasopharyngeal Swab; Respiratory  Result Value Ref Range   Adenovirus NOT DETECTED NOT DETECTED   Coronavirus 229E NOT DETECTED NOT DETECTED    Comment: (NOTE) The Coronavirus on the Respiratory Panel, DOES NOT test for the novel  Coronavirus (2019 nCoV)    Coronavirus HKU1 NOT DETECTED NOT DETECTED   Coronavirus NL63 NOT DETECTED NOT DETECTED   Coronavirus OC43 NOT DETECTED NOT DETECTED   Metapneumovirus NOT DETECTED NOT DETECTED   Rhinovirus / Enterovirus NOT DETECTED NOT DETECTED   Influenza A NOT DETECTED NOT DETECTED   Influenza B NOT DETECTED NOT DETECTED   Parainfluenza Virus 1 NOT DETECTED NOT DETECTED   Parainfluenza Virus 2 NOT DETECTED NOT DETECTED   Parainfluenza Virus 3 DETECTED (A) NOT DETECTED   Parainfluenza Virus 4 NOT DETECTED NOT DETECTED   Respiratory Syncytial Virus NOT DETECTED  NOT DETECTED   Bordetella pertussis NOT DETECTED NOT DETECTED   Bordetella Parapertussis NOT DETECTED NOT DETECTED   Chlamydophila pneumoniae NOT DETECTED NOT DETECTED   Mycoplasma pneumoniae NOT DETECTED NOT DETECTED    Comment: Performed at [facility], [address], [place], [place] [address]    Current Facility-Administered Medications  Medication Dose Route Frequency Provider Last Rate Last Admin   acetaminophen (TYLENOL) tablet 650 mg  650 mg Oral Q6H PRN Bennett, Christal H, NP   650 mg at 09/29/22 6045   cyanocobalamin (VITAMIN B12) tablet 1,000 mcg  1,000 mcg Oral Daily Lurene Shadow, MD   1,000 mcg at 09/29/22 4098   diphenhydrAMINE (BENADRYL) capsule 25 mg  25 mg Oral Once Foust, Katy L, NP       divalproex (DEPAKOTE SPRINKLE) capsule 250 mg  250 mg Oral Q12H Cordella Register A, RPH   250 mg at 09/29/22 0958   donepezil (ARICEPT) tablet 5 mg  5 mg Oral Daily Cox, Amy N, DO   5 mg at 09/29/22 0958   enoxaparin (LOVENOX) injection 40 mg  40 mg Subcutaneous Q24H Cox, Amy N, DO   40 mg at 09/28/22 2129   feeding supplement (ENSURE ENLIVE / ENSURE PLUS) liquid 237 mL  237 mL Oral BID BM Cox, Amy N, DO   237 mL at 09/29/22 0959   guaiFENesin-dextromethorphan (ROBITUSSIN DM) 100-10 MG/5ML syrup 5 mL  5 mL Oral Q6H PRN Lurene Shadow, MD   5 mL at 09/29/22 1010   haloperidol (HALDOL) tablet 5 mg  5 mg Oral BID PRN Charm Rings, NP   5 mg at 09/29/22 1118   Or   haloperidol lactate (HALDOL) injection 5 mg  5 mg Intramuscular BID PRN Charm Rings, NP   5 mg at 09/24/22 0217   ondansetron (ZOFRAN) injection 4 mg  4 mg Intravenous Once Manuela Schwartz, NP       pravastatin (PRAVACHOL) tablet 40 mg  40 mg Oral QHS Cox, Amy N, DO  40 mg at 09/28/22 2118   QUEtiapine (SEROQUEL) tablet 100 mg  100 mg Oral QHS Clapacs, John T, MD   100 mg at 09/28/22 2120   QUEtiapine (SEROQUEL) tablet 50 mg  50 mg Oral BID Charm Rings, NP   50 mg at 09/29/22 1610   rOPINIRole (REQUIP) tablet  0.5 mg  0.5 mg Oral QHS Bennett, Christal H, NP   0.5 mg at 09/28/22 2118   tamsulosin (FLOMAX) capsule 0.4 mg  0.4 mg Oral QPC supper Cox, Amy N, DO   0.4 mg at 09/28/22 1646    Musculoskeletal: Strength & Muscle Tone: decreased Gait & Station: unsteady Patient leans: N/A  Psychiatric Specialty Exam: Physical Exam Vitals and nursing note reviewed.  Constitutional:      Appearance: Normal appearance.  HENT:     Head: Normocephalic and atraumatic.     Nose: Nose normal.  Pulmonary:     Effort: Pulmonary effort is normal.  Musculoskeletal:        General: Normal range of motion.  Neurological:     General: No focal deficit present.     Mental Status: Mental status is at baseline.  Psychiatric:        Attention and Perception: He is inattentive.        Mood and Affect: Affect is flat.        Speech: He is noncommunicative.        Cognition and Memory: Cognition is impaired. Memory is impaired.        Judgment: Judgment is impulsive.     Review of Systems  Unable to perform ROS: Dementia  Psychiatric/Behavioral:  Positive for memory loss. The patient is nervous/anxious.   All other systems reviewed and are negative.   Blood pressure 123/86, pulse 71, temperature 98.1 F (36.7 C), resp. rate 20, height 5\' 10"  (1.778 m), weight 78.2 kg, SpO2 97 %.Body mass index is 24.74 kg/m.  General Appearance: Casual  Eye Contact:  Minimal  Speech:  NA  Volume:  He did not talk  Mood:  Anxious and Irritable  Affect:  Blunt  Thought Process:  UTA, unable to assess  Orientation:  Other:  person  Thought Content:  UTA  Suicidal Thoughts:  UTA  Homicidal Thoughts:  UTA  Memory:  UTA  Judgement:  Impaired  Insight:  UTA  Psychomotor Activity:  Increased  Concentration:  Concentration: Poor and Attention Span: Poor  Recall:  [employer] of Knowledge:  UTA  Language:  Negative  Akathisia:  No  Handed:  Right  AIMS (if indicated):     Assets:  Leisure Time Resilience  ADL's:   Impaired  Cognition:  Impaired,  Moderate  Sleep:        Physical Exam: Physical Exam Vitals and nursing note reviewed.  Constitutional:      Appearance: Normal appearance.  HENT:     Head: Normocephalic and atraumatic.     Nose: Nose normal.  Pulmonary:     Effort: Pulmonary effort is normal.  Musculoskeletal:        General: Normal range of motion.  Neurological:     General: No focal deficit present.     Mental Status: Mental status is at baseline.  Psychiatric:        Attention and Perception: He is inattentive.        Mood and Affect: Affect is flat.        Speech: He is noncommunicative.        Cognition  and Memory: Cognition is impaired. Memory is impaired.        Judgment: Judgment is impulsive.    Review of Systems  Unable to perform ROS: Dementia  Psychiatric/Behavioral:  Positive for memory loss. The patient is nervous/anxious.   All other systems reviewed and are negative.  Blood pressure 123/86, pulse 71, temperature 98.1 F (36.7 C), resp. rate 20, height 5\' 10"  (1.778 m), weight 78.2 kg, SpO2 97 %. Body mass index is 24.74 kg/m.  Treatment Plan Summary:  Dementia with behavioral disturbance: Seroquel increased to 100 mg TID Depakote 250 mg BID increased to 500 mg BID  Agitation:  haldol 5 mg PRN BID  Disposition: Supportive therapy provided about ongoing stressors.  He does not meet psychiatric hospitalization criteria, SNF Placement needed.  Nanine Means, NP 09/29/2022 11:39 AM

## 2022-09-29 NOTE — Progress Notes (Signed)
Patient presenting with a cough, headache, runny nose. This is new today, RN had this patient last night and he only had a slight cough, no runny nose or headache. Patient also complains of body aches and slight nausea. Patient states he does not feel well.   RN notified Manuela Schwartz, NP of the above information.   Orders Received: droplet/contact isolation, Respiratory panel by PCR, IV Zofran  RN informed NP that the patient does not have IV access and RN will not be able to administer the IV zofran.

## 2022-09-30 DIAGNOSIS — E538 Deficiency of other specified B group vitamins: Secondary | ICD-10-CM | POA: Diagnosis not present

## 2022-09-30 DIAGNOSIS — F03918 Unspecified dementia, unspecified severity, with other behavioral disturbance: Secondary | ICD-10-CM | POA: Diagnosis not present

## 2022-09-30 MED ORDER — TRAZODONE HCL 50 MG PO TABS
50.0000 mg | ORAL_TABLET | Freq: Every evening | ORAL | Status: DC | PRN
  Administered 2022-09-30 – 2022-10-02 (×3): 50 mg via ORAL
  Filled 2022-09-30 (×3): qty 1

## 2022-09-30 NOTE — Consult Note (Signed)
The patient is currently on droplet and contact precautions for an upper respiratory infection (URI)/ parainfluenza infection. Upon approach, patient appears to be sleeping soundly; Rise and fall of chest observed.  There is a sitter present at the bedside for fall precautions.  Charge nurse, Alvino Chapel reports patient has been experiencing difficulty sleeping for the past 2 nights and tends to become agitated and difficult to redirect in the afternoons, despite frequent walks to help manage agitation. Verbal orders given to charge nurse to adjust quetiapine dosage times to 0800, 1400, and 2000.  According to the [facility]: [name] patient received PRN haloperidol this morning at the same time of routine quetiapine. Discussed with charge nurse to educate staff to refrain from administering PRN haloperidol 2 hours within routine quetiapine due to risk of oversedation. Will add PRN trazodone 50 mg at bedtime to aid in sleep support. Psych team will continue to follow to manage medications and behaviors.   Terry Schellenberg H. Willeen Cass, NP 09/30/2022 1040

## 2022-09-30 NOTE — Progress Notes (Signed)
Progress Note    Terry Sloan  ZOX:[id number] DOB: June 03, 1941  DOA: 08/08/2022 PCP: Pcp, No      Brief Narrative:    Medical records reviewed and are as summarized below:  Terry Sloan is a 82 y.o. male  with history of dementia, behavioral disturbance, hyperlipidemia, BPH, insomnia, depression, anxiety, who presents to emergency department from [facility] rehab dementia department on 08/08/2022 for chief concerns of aggression with aggressive outburst towards staff.   Patient was initially IVC.  Behavioral health was consulted and determined that patient was not a danger to self or others.  Patient was pending placement, however was declined due to as needed Haldol doses were given.   On day of admission, nursing noticed that patient's temperature was low, decrease responsiveness/change in mentation, and hypotension.      Assessment/Plan:   Principal Problem:   Dementia with behavioral disturbance (HCC) Active Problems:   Mixed hyperlipidemia   History of TIA (transient ischemic attack)   Anxiety and depression   BPH (benign prostatic hyperplasia)   OAB (overactive bladder)   Altered mental status   Hypothermia   Restless leg   Vitamin B12 deficiency   Parainfluenza infection   Body mass index is 24.74 kg/m.   Upper respiratory tract infection/parainfluenza infection Robitussin as needed for cough.   Altered mental status With hypotension and hypothermia, now resolved..  Ammonia level was normal Continue supportive care.    Restless leg Continue Requip at night   Hypothermia Resolved   Dementia with behavioral disturbance, intermittent agitation Continue antipsychotics.  Seroquel has been increased to 100 mg 3 times daily.  He is on Haldol as needed for agitation and Depakote.  Trazodone has been ordered for sleep at night as needed.  Follow-up with psychiatrist.   BPH (benign prostatic hyperplasia) Continue Flomax    Anxiety and  depression Continue Seroquel   Vitamin B12 deficiency  B12 level was 167 on 09/06/2022.  Continue oral vitamin B12 supplement    Diet Order             Diet regular Fluid consistency: Thin  Diet effective now                            Consultants: Psychiatrist  Procedures: None    Medications:    vitamin B-12  1,000 mcg Oral Daily   diphenhydrAMINE  25 mg Oral Once   divalproex  500 mg Oral Q12H   donepezil  5 mg Oral Daily   enoxaparin (LOVENOX) injection  40 mg Subcutaneous Q24H   feeding supplement  237 mL Oral BID BM   ondansetron (ZOFRAN) IV  4 mg Intravenous Once   pravastatin  40 mg Oral QHS   QUEtiapine  100 mg Oral TID   rOPINIRole  0.5 mg Oral QHS   tamsulosin  0.4 mg Oral QPC supper   Continuous Infusions:   Anti-infectives (From admission, onward)    None              Family Communication/Anticipated D/C date and plan/Code Status   DVT prophylaxis: enoxaparin (LOVENOX) injection 40 mg Start: 09/06/22 2200 Place TED hose Start: 09/06/22 1609     Code Status: Full Code  Family Communication: None Disposition Plan: Plan to discharge to SNF   Status is: Inpatient Remains inpatient appropriate because: Awaiting placement to SNF       Subjective:   Interval events.  He has no  complaints.  He was having breakfast.  Sitter at the bedside.  Objective:    Vitals:   09/29/22 0539 09/29/22 1947 09/30/22 0300 09/30/22 1009  BP: 123/86 119/76 (!) 107/58 (!) 102/56  Pulse: 71 77 61 79  Resp: 20 12 14 17   Temp: 98.1 F (36.7 C) 97.9 F (36.6 C) 97.7 F (36.5 C) (!) 97.3 F (36.3 C)  TempSrc:  Tympanic Tympanic   SpO2: 97%   93%  Weight:      Height:       No data found.   Intake/Output Summary (Last 24 hours) at 09/30/2022 1413 Last data filed at 09/30/2022 1610 Gross per 24 hour  Intake 480 ml  Output 300 ml  Net 180 ml   Filed Weights   08/08/22 1205 08/14/22 0038 09/07/22 0057  Weight: 81 kg 78.2 kg  78.2 kg    Exam:   GEN: NAD, sitting up in the chair  SKIN: Warm and dry EYES: EOMI ENT: MMM CV: RRR PULM: CTA B ABD: soft, ND, NT, +BS CNS: AAO x 1 (person), non focal EXT: No edema or tenderness PSYCH: Calm at a time of my visit.  Poor insight and judgment    Data Reviewed:   I have personally reviewed following labs and imaging studies:  Labs: Labs show the following:   Basic Metabolic Panel: Recent Labs  Lab 09/25/22 0531  NA 139  K 4.1  CL 107  CO2 23  GLUCOSE 94  BUN 27*  CREATININE 1.05  CALCIUM 8.7*    GFR Estimated Creatinine Clearance: 55 mL/min (by C-G formula based on SCr of 1.05 mg/dL). Liver Function Tests: No results for input(s): "AST", "ALT", "ALKPHOS", "BILITOT", "PROT", "ALBUMIN" in the last 168 hours. No results for input(s): "LIPASE", "AMYLASE" in the last 168 hours. No results for input(s): "AMMONIA" in the last 168 hours.  Coagulation profile No results for input(s): "INR", "PROTIME" in the last 168 hours.  CBC: Recent Labs  Lab 09/25/22 0531  WBC 5.7  NEUTROABS 3.5  HGB 11.7*  HCT 36.2*  MCV 96.0  PLT 280    Cardiac Enzymes: No results for input(s): "CKTOTAL", "CKMB", "CKMBINDEX", "TROPONINI" in the last 168 hours. BNP (last 3 results) No results for input(s): "PROBNP" in the last 8760 hours. CBG: No results for input(s): "GLUCAP" in the last 168 hours. D-Dimer: No results for input(s): "DDIMER" in the last 72 hours. Hgb A1c: No results for input(s): "HGBA1C" in the last 72 hours. Lipid Profile: No results for input(s): "CHOL", "HDL", "LDLCALC", "TRIG", "CHOLHDL", "LDLDIRECT" in the last 72 hours. Thyroid function studies: No results for input(s): "TSH", "T4TOTAL", "T3FREE", "THYROIDAB" in the last 72 hours.  Invalid input(s): "FREET3" Anemia work up: No results for input(s): "VITAMINB12", "FOLATE", "FERRITIN", "TIBC", "IRON", "RETICCTPCT" in the last 72 hours. Sepsis Labs: Recent Labs  Lab 09/25/22 0531  WBC  5.7     Microbiology Recent Results (from the past 240 hour(s))  Respiratory (~20 pathogens) panel by PCR     Status: Abnormal   Collection Time: 09/28/22 10:40 PM   Specimen: Nasopharyngeal Swab; Respiratory  Result Value Ref Range Status   Adenovirus NOT DETECTED NOT DETECTED Final   Coronavirus 229E NOT DETECTED NOT DETECTED Final    Comment: (NOTE) The Coronavirus on the Respiratory Panel, DOES NOT test for the novel  Coronavirus (2019 nCoV)    Coronavirus HKU1 NOT DETECTED NOT DETECTED Final   Coronavirus NL63 NOT DETECTED NOT DETECTED Final   Coronavirus OC43 NOT DETECTED NOT  DETECTED Final   Metapneumovirus NOT DETECTED NOT DETECTED Final   Rhinovirus / Enterovirus NOT DETECTED NOT DETECTED Final   Influenza A NOT DETECTED NOT DETECTED Final   Influenza B NOT DETECTED NOT DETECTED Final   Parainfluenza Virus 1 NOT DETECTED NOT DETECTED Final   Parainfluenza Virus 2 NOT DETECTED NOT DETECTED Final   Parainfluenza Virus 3 DETECTED (A) NOT DETECTED Final   Parainfluenza Virus 4 NOT DETECTED NOT DETECTED Final   Respiratory Syncytial Virus NOT DETECTED NOT DETECTED Final   Bordetella pertussis NOT DETECTED NOT DETECTED Final   Bordetella Parapertussis NOT DETECTED NOT DETECTED Final   Chlamydophila pneumoniae NOT DETECTED NOT DETECTED Final   Mycoplasma pneumoniae NOT DETECTED NOT DETECTED Final    Comment: Performed at [facility], [address] [place], [place] [address]     Procedures and diagnostic studies:  No results found.             LOS: 24 days   Lamarcus Spira  [facility]   Pager on [email or web address]. If 7PM-7AM, please contact night-coverage at [email or web address]     09/30/2022, 2:13 PM

## 2022-09-30 NOTE — Plan of Care (Signed)
  Problem: Fluid Volume: Goal: Hemodynamic stability will improve Outcome: Progressing   Problem: Respiratory: Goal: Ability to maintain adequate ventilation will improve Outcome: Progressing   Problem: Clinical Measurements: Goal: Respiratory complications will improve Outcome: Progressing Goal: Cardiovascular complication will be avoided Outcome: Progressing   Problem: Activity: Goal: Risk for activity intolerance will decrease Outcome: Progressing   Problem: Coping: Goal: Level of anxiety will decrease Outcome: Progressing   Problem: Elimination: Goal: Will not experience complications related to bowel motility Outcome: Progressing Goal: Will not experience complications related to urinary retention Outcome: Progressing   Problem: Safety: Goal: Ability to remain free from injury will improve Outcome: Progressing   Problem: Skin Integrity: Goal: Risk for impaired skin integrity will decrease Outcome: Progressing

## 2022-09-30 NOTE — TOC Progression Note (Signed)
Transition of Care [facility]) - Progression Note    Patient Details  Name: Terry Sloan MRN: [id number] Date of Birth: 07/19/1940  Transition of Care [facility]) CM/SW Contact  Darleene Cleaver, [place] Phone Number: 09/30/2022, 10:58 AM  Clinical Narrative:     CSW called [facility] DSS to find out an update on patient's medicaid.  Per [facility], famiy has to resubmit for [place], they also requested updated clinicals to be sent to DSS.  CSW faxed updated clinicals, awaiting for call back.  Expected Discharge Plan:  (TBD) Barriers to Discharge: Continued Medical Work up  Expected Discharge Plan and Services                                               Social Determinants of Health (SDOH) Interventions SDOH Screenings   Food Insecurity: Patient Unable To Answer (09/07/2022)  Housing: Low Risk  (09/07/2022)  Transportation Needs: Patient Unable To Answer (09/07/2022)  Utilities: Not At Risk (07/23/2022)  Alcohol Screen: Low Risk  (07/23/2022)  Depression (PHQ2-9): Low Risk  (07/23/2022)  Recent Concern: Depression (PHQ2-9) - Medium Risk (06/17/2022)  Financial Resource Strain: Low Risk  (07/23/2022)  Physical Activity: Insufficiently Active (07/23/2022)  Social Connections: Socially Isolated (07/23/2022)  Stress: No Stress Concern Present (07/23/2022)  Tobacco Use: Medium Risk (09/11/2022)    Readmission Risk Interventions     No data to display

## 2022-09-30 NOTE — TOC Progression Note (Addendum)
Transition of Care [facility]) - Progression Note    Patient Details  Name: Terry Sloan MRN: [id number] Date of Birth: 12-03-1940  Transition of Care [facility] CM/SW Contact  Darleene Cleaver, [place] Phone Number: 09/30/2022, 6:39 PM  Clinical Narrative:     Patient has been refaxed out trying to find placement for patient at nursing facility under LTC.  TOC continuing to follow patient's progress throughout discharge planning.  Expected Discharge Plan:  (TBD) Barriers to Discharge: Continued Medical Work up  Expected Discharge Plan and Services   SNF under LTC                                             Social Determinants of Health (SDOH) Interventions SDOH Screenings   Food Insecurity: Patient Unable To Answer (09/07/2022)  Housing: Low Risk  (09/07/2022)  Transportation Needs: Patient Unable To Answer (09/07/2022)  Utilities: Not At Risk (07/23/2022)  Alcohol Screen: Low Risk  (07/23/2022)  Depression (PHQ2-9): Low Risk  (07/23/2022)  Recent Concern: Depression (PHQ2-9) - Medium Risk (06/17/2022)  Financial Resource Strain: Low Risk  (07/23/2022)  Physical Activity: Insufficiently Active (07/23/2022)  Social Connections: Socially Isolated (07/23/2022)  Stress: No Stress Concern Present (07/23/2022)  Tobacco Use: Medium Risk (09/11/2022)    Readmission Risk Interventions     No data to display

## 2022-09-30 NOTE — TOC Progression Note (Signed)
Transition of Care [facility]) - Progression Note    Patient Details  Name: Terry Sloan MRN: [id number] Date of Birth: 1940-11-25  Transition of Care [facility] CM/SW Contact  Darleene Cleaver, [place] Phone Number: 09/30/2022, 11:12 AM  Clinical Narrative:     Still trying to find placement options for patient.  Expected Discharge Plan:  (TBD) Barriers to Discharge: Continued Medical Work up  Expected Discharge Plan and Services                                               Social Determinants of Health (SDOH) Interventions SDOH Screenings   Food Insecurity: Patient Unable To Answer (09/07/2022)  Housing: Low Risk  (09/07/2022)  Transportation Needs: Patient Unable To Answer (09/07/2022)  Utilities: Not At Risk (07/23/2022)  Alcohol Screen: Low Risk  (07/23/2022)  Depression (PHQ2-9): Low Risk  (07/23/2022)  Recent Concern: Depression (PHQ2-9) - Medium Risk (06/17/2022)  Financial Resource Strain: Low Risk  (07/23/2022)  Physical Activity: Insufficiently Active (07/23/2022)  Social Connections: Socially Isolated (07/23/2022)  Stress: No Stress Concern Present (07/23/2022)  Tobacco Use: Medium Risk (09/11/2022)    Readmission Risk Interventions     No data to display

## 2022-10-01 DIAGNOSIS — E538 Deficiency of other specified B group vitamins: Secondary | ICD-10-CM | POA: Diagnosis not present

## 2022-10-01 DIAGNOSIS — F03918 Unspecified dementia, unspecified severity, with other behavioral disturbance: Secondary | ICD-10-CM | POA: Diagnosis not present

## 2022-10-01 NOTE — Progress Notes (Signed)
Mobility Specialist - Progress Note   10/01/22 1032  Mobility  Activity Contraindicated/medical hold   NT denied mobility due to "him sleeping".  Zetta Bills Mobility Specialist 10/01/22 10:33 AM

## 2022-10-01 NOTE — Progress Notes (Addendum)
Progress Note    Terry Sloan  ZOX:[id number] DOB: Jul 05, 1940  DOA: 08/08/2022 PCP: Pcp, No      Brief Narrative:    Medical records reviewed and are as summarized below:  Terry Sloan is a 82 y.o. male  with history of dementia, behavioral disturbance, hyperlipidemia, BPH, insomnia, depression, anxiety, who presents to emergency department from [facility] rehab dementia department on 08/08/2022 for chief concerns of aggression with aggressive outburst towards staff.   Patient was initially IVC.  Behavioral health was consulted and determined that patient was not a danger to self or others.  Patient was pending placement, however was declined due to as needed Haldol doses were given.   On day of admission, nursing noticed that patient's temperature was low, decrease responsiveness/change in mentation, and hypotension.      Assessment/Plan:   Principal Problem:   Dementia with behavioral disturbance (HCC) Active Problems:   Mixed hyperlipidemia   History of TIA (transient ischemic attack)   Anxiety and depression   BPH (benign prostatic hyperplasia)   OAB (overactive bladder)   Altered mental status   Hypothermia   Restless leg   Vitamin B12 deficiency   Parainfluenza infection   Body mass index is 24.74 kg/m.   Upper respiratory tract infection/parainfluenza infection Robitussin as needed for cough.   Altered mental status With hypotension and hypothermia, now resolved..  Ammonia level was normal Continue supportive care.    Restless leg Continue Requip at night   Hypothermia Resolved   Dementia with behavioral disturbance, intermittent agitation Continue Seroquel, Haldol as needed, Depakote and donepezil. Trazodone has been ordered for sleep at night as needed.  Continue one-to-one sitter for safety.  Follow-up with psychiatrist.   BPH (benign prostatic hyperplasia) Continue Flomax    Anxiety and depression Continue Seroquel   Vitamin B12  deficiency  B12 level was 167 on 09/06/2022.  Continue oral vitamin B12 supplement    Diet Order             Diet regular Fluid consistency: Thin  Diet effective now                            Consultants: Psychiatrist  Procedures: None    Medications:    vitamin B-12  1,000 mcg Oral Daily   diphenhydrAMINE  25 mg Oral Once   divalproex  500 mg Oral Q12H   donepezil  5 mg Oral Daily   enoxaparin (LOVENOX) injection  40 mg Subcutaneous Q24H   feeding supplement  237 mL Oral BID BM   ondansetron (ZOFRAN) IV  4 mg Intravenous Once   pravastatin  40 mg Oral QHS   QUEtiapine  100 mg Oral TID   rOPINIRole  0.5 mg Oral QHS   tamsulosin  0.4 mg Oral QPC supper   Continuous Infusions:   Anti-infectives (From admission, onward)    None              Family Communication/Anticipated D/C date and plan/Code Status   DVT prophylaxis: enoxaparin (LOVENOX) injection 40 mg Start: 09/06/22 2200 Place TED hose Start: 09/06/22 1609     Code Status: Full Code  Family Communication: None Disposition Plan: Plan to discharge to SNF   Status is: Inpatient Remains inpatient appropriate because: Awaiting placement to SNF       Subjective:   Interval events noted.  He is sleepy and cannot provide any information.  He has a Comptroller  at the bedside.  Objective:    Vitals:   09/30/22 1554 09/30/22 1930 10/01/22 0321 10/01/22 0950  BP: 101/60 106/62 (!) 104/58 133/69  Pulse: 82 80 81 80  Resp: 18 17 18 16   Temp: (!) 97.5 F (36.4 C) 97.8 F (36.6 C) 97.9 F (36.6 C) 97.8 F (36.6 C)  TempSrc:  Oral Oral Oral  SpO2: 99% 100% 100% 100%  Weight:      Height:       No data found.  No intake or output data in the 24 hours ending 10/01/22 1329  Filed Weights   08/08/22 1205 08/14/22 0038 09/07/22 0057  Weight: 81 kg 78.2 kg 78.2 kg    Exam:   GEN: NAD SKIN:Warm and dry EYES: No acute abnormality ENT: MMM CV: RRR PULM: CTA B ABD: soft,  ND, NT, +BS CNS: Sleepy EXT: No edema or tenderness   Data Reviewed:   I have personally reviewed following labs and imaging studies:  Labs: Labs show the following:   Basic Metabolic Panel: Recent Labs  Lab 09/25/22 0531  NA 139  K 4.1  CL 107  CO2 23  GLUCOSE 94  BUN 27*  CREATININE 1.05  CALCIUM 8.7*    GFR Estimated Creatinine Clearance: 55 mL/min (by C-G formula based on SCr of 1.05 mg/dL). Liver Function Tests: No results for input(s): "AST", "ALT", "ALKPHOS", "BILITOT", "PROT", "ALBUMIN" in the last 168 hours. No results for input(s): "LIPASE", "AMYLASE" in the last 168 hours. No results for input(s): "AMMONIA" in the last 168 hours.  Coagulation profile No results for input(s): "INR", "PROTIME" in the last 168 hours.  CBC: Recent Labs  Lab 09/25/22 0531  WBC 5.7  NEUTROABS 3.5  HGB 11.7*  HCT 36.2*  MCV 96.0  PLT 280    Cardiac Enzymes: No results for input(s): "CKTOTAL", "CKMB", "CKMBINDEX", "TROPONINI" in the last 168 hours. BNP (last 3 results) No results for input(s): "PROBNP" in the last 8760 hours. CBG: No results for input(s): "GLUCAP" in the last 168 hours. D-Dimer: No results for input(s): "DDIMER" in the last 72 hours. Hgb A1c: No results for input(s): "HGBA1C" in the last 72 hours. Lipid Profile: No results for input(s): "CHOL", "HDL", "LDLCALC", "TRIG", "CHOLHDL", "LDLDIRECT" in the last 72 hours. Thyroid function studies: No results for input(s): "TSH", "T4TOTAL", "T3FREE", "THYROIDAB" in the last 72 hours.  Invalid input(s): "FREET3" Anemia work up: No results for input(s): "VITAMINB12", "FOLATE", "FERRITIN", "TIBC", "IRON", "RETICCTPCT" in the last 72 hours. Sepsis Labs: Recent Labs  Lab 09/25/22 0531  WBC 5.7     Microbiology Recent Results (from the past 240 hour(s))  Respiratory (~20 pathogens) panel by PCR     Status: Abnormal   Collection Time: 09/28/22 10:40 PM   Specimen: Nasopharyngeal Swab; Respiratory   Result Value Ref Range Status   Adenovirus NOT DETECTED NOT DETECTED Final   Coronavirus 229E NOT DETECTED NOT DETECTED Final    Comment: (NOTE) The Coronavirus on the Respiratory Panel, DOES NOT test for the novel  Coronavirus (2019 nCoV)    Coronavirus HKU1 NOT DETECTED NOT DETECTED Final   Coronavirus NL63 NOT DETECTED NOT DETECTED Final   Coronavirus OC43 NOT DETECTED NOT DETECTED Final   Metapneumovirus NOT DETECTED NOT DETECTED Final   Rhinovirus / Enterovirus NOT DETECTED NOT DETECTED Final   Influenza A NOT DETECTED NOT DETECTED Final   Influenza B NOT DETECTED NOT DETECTED Final   Parainfluenza Virus 1 NOT DETECTED NOT DETECTED Final   Parainfluenza Virus 2  NOT DETECTED NOT DETECTED Final   Parainfluenza Virus 3 DETECTED (A) NOT DETECTED Final   Parainfluenza Virus 4 NOT DETECTED NOT DETECTED Final   Respiratory Syncytial Virus NOT DETECTED NOT DETECTED Final   Bordetella pertussis NOT DETECTED NOT DETECTED Final   Bordetella Parapertussis NOT DETECTED NOT DETECTED Final   Chlamydophila pneumoniae NOT DETECTED NOT DETECTED Final   Mycoplasma pneumoniae NOT DETECTED NOT DETECTED Final    Comment: Performed at [facility], [address], [place], [place] [address]     Procedures and diagnostic studies:  No results found.             LOS: 25 days   Amorina Doerr  [facility] [employer] loss adjuster on [email or web address]. If 7PM-7AM, please contact night-coverage at [email or web address]     10/01/2022, 1:29 PM

## 2022-10-02 DIAGNOSIS — F03918 Unspecified dementia, unspecified severity, with other behavioral disturbance: Secondary | ICD-10-CM | POA: Diagnosis not present

## 2022-10-02 NOTE — Consult Note (Signed)
[facility] Face-to-Face Psychiatry Consult   Reason for Consult:  Psychiatric Evaluation  Referring Physician:  Dr, Myriam Forehand  Patient Identification: Terry Sloan MRN:  [id number] Principal Diagnosis: Dementia with behavioral disturbance [facility]) Diagnosis:  Principal Problem:   Dementia with behavioral disturbance (HCC) Active Problems:   Mixed hyperlipidemia   History of TIA (transient ischemic attack)   Anxiety and depression   BPH (benign prostatic hyperplasia)   OAB (overactive bladder)   Altered mental status   Hypothermia   Restless leg   Vitamin B12 deficiency   Parainfluenza infection   Total Time spent with patient: 30 minutes  Subjective:    Terry Sloan is a 82 y.o. male patient with a known history of dementia exhibiting severe behavioral disturbances, including aggression and physical violence, necessitating frequent administration of as needed IM medications.  On evaluation today, patient is seated in a Geri chair at the bedside. There is a sitter present for fall precautions. He is alert and oriented to self only; able to state his full name. When asked what kind of facility is this, he states [facility] He does not appear to be responding to internal or external stimuli during this encounter. Staff reports he continues to exhibit signs of restlessness with attempts to exit the bed without assistance, and was awake most of the previous night.  He received PRN Haloperidol 5 mg oral along with PRN Trazodone 50 mg at approximately 0017 to combat restlessness and aid in sleep support. The Psych Team will continue to follow patient to assess and manage behavioral concerns.  HPI per Dr Toni Amend on 4/23: 82 year old man with a history of advanced dementia who has been seen previously by the behavioral health team for agitation.  Has developed intermittent agitated behaviors it seems over the last couple months.  Reports that last night the patient became agitated in the late night  hours had to be given as needed haloperidol.  When I came to see him today the patient was sitting up in bed and was allowing his relative to feed him but was not speaking making eye contact or showing any other signs of alertness.  Family reports that he had been like that all day long.  They say this is typical after he has been giving haloperidol.  They understand that he has intermittent spells of agitation as well.  Their only concern with medication is that the Haldol may be over sedating and also that his blood pressure seems to go low intermittently.  Patient himself unable to make any complaint.  Was not agitated at least with his family.  Primary issue at this point seems to be largely placement.    Past Psychiatric History: History of dementia with behavioral disturbances, worsening over the past 3 years. Admitted to [facility] 07/27/22 with aggressive behavior in the setting of worsening dementia. He presented to [facility] ED March 2024 with similar behaviors.   Risk to Self:   Risk to Others:   Prior Inpatient Therapy:   Prior Outpatient Therapy:    Past Medical History:  Past Medical History:  Diagnosis Date   Arthritis    Dementia (HCC)    per EMS   GERD (gastroesophageal reflux disease)    [name]   History of kidney stones    Hyperlipidemia    Macular degeneration    PVD (peripheral vascular disease) (HCC)    Stroke (HCC)    TIA      Past Surgical History:  Procedure Laterality Date  Bone turmor Left    foreheah   EYE SURGERY Bilateral    cataract removal   FRACTURE SURGERY Left    leg   pins   KNEE ARTHROPLASTY     KNEE SURGERY Left    NECK SURGERY     Tumor reomoved from head/neck in the 60s   REPLACEMENT TOTAL KNEE Left 07/14/2018   ROTATOR CUFF REPAIR Right    times 2   TOTAL KNEE ARTHROPLASTY WITH HARDWARE REMOVAL Left 02/14/2017   Procedure: Removal Left Tibia Nail, Cemented Total Knee Arthroplasty;  Surgeon: Eldred Manges, MD;  Location: [facility] OR;  Service:  Orthopedics;  Laterality: Left;   Family History:  Family History  Problem Relation Age of Onset   Stroke Neg Hx    Family Psychiatric  History: Unknown Social History:  Social History   Substance and Sexual Activity  Alcohol Use Yes   Comment: Occasional beer     Social History   Substance and Sexual Activity  Drug Use No    Social History   Socioeconomic History   Marital status: Media planner    Spouse name: Not on file   Number of children: 9   Years of education: Not on file   Highest education level: Not on file  Occupational History   Occupation: Retired  Tobacco Use   Smoking status: Former    Packs/day: 1.50    Years: 20.00    Additional pack years: 0.00    Total pack years: 30.00    Types: Cigarettes   Smokeless tobacco: Never   Tobacco comments:    Quit 20+ year ago  Vaping Use   Vaping Use: Never used  Substance and Sexual Activity   Alcohol use: Yes    Comment: Occasional beer   Drug use: No   Sexual activity: Not Currently  Other Topics Concern   Not on file  Social History Narrative   Lives at home w/ his significant other   Right-handed   Caffeine: some coffee, 3 diet Mt Dew's per day   Social Determinants of Health   Financial Resource Strain: Low Risk  (07/23/2022)   Overall Financial Resource Strain (CARDIA)    Difficulty of Paying Living Expenses: Not hard at all  Food Insecurity: Patient Unable To Answer (09/07/2022)   Hunger Vital Sign    Worried About Running Out of Food in the Last Year: Patient unable to answer    Ran Out of Food in the Last Year: Patient unable to answer  Transportation Needs: Patient Unable To Answer (09/07/2022)   PRAPARE - Transportation    Lack of Transportation (Medical): Patient unable to answer    Lack of Transportation (Non-Medical): Patient unable to answer  Physical Activity: Insufficiently Active (07/23/2022)   Exercise Vital Sign    Days of Exercise per Week: 3 days    Minutes of Exercise per  Session: 30 min  Stress: No Stress Concern Present (07/23/2022)   [employer] of Occupational Health - Occupational Stress Questionnaire    Feeling of Stress : Not at all  Social Connections: Socially Isolated (07/23/2022)   Social Connection and Isolation Panel [NHANES]    Frequency of Communication with Friends and Family: More than three times a week    Frequency of Social Gatherings with Friends and Family: Never    Attends Religious Services: Never    Database administrator or Organizations: No    Attends Banker Meetings: Never    Marital Status: Widowed  Additional Social History:    Allergies:   Allergies  Allergen Reactions   Celebrex [Celecoxib] Other (See Comments)    Leg swelling, broke out in rash    Labs: No results found for this or any previous visit (from the past 48 hour(s)).  Current Facility-Administered Medications  Medication Dose Route Frequency Provider Last Rate Last Admin   acetaminophen (TYLENOL) tablet 650 mg  650 mg Oral Q6H PRN Louretta Tantillo H, NP   650 mg at 10/01/22 1751   cyanocobalamin (VITAMIN B12) tablet 1,000 mcg  1,000 mcg Oral Daily Lurene Shadow, MD   1,000 mcg at 10/02/22 9604   diphenhydrAMINE (BENADRYL) capsule 25 mg  25 mg Oral Once Foust, Katy L, NP       divalproex (DEPAKOTE SPRINKLE) capsule 500 mg  500 mg Oral Q12H Jaynie Bream, RPH   500 mg at 10/02/22 5409   donepezil (ARICEPT) tablet 5 mg  5 mg Oral Daily Cox, Amy N, DO   5 mg at 10/02/22 0852   enoxaparin (LOVENOX) injection 40 mg  40 mg Subcutaneous Q24H Cox, Amy N, DO   40 mg at 10/01/22 2113   feeding supplement (ENSURE ENLIVE / ENSURE PLUS) liquid 237 mL  237 mL Oral BID BM Cox, Amy N, DO   237 mL at 10/01/22 1425   guaiFENesin-dextromethorphan (ROBITUSSIN DM) 100-10 MG/5ML syrup 5 mL  5 mL Oral Q6H PRN Lurene Shadow, MD   5 mL at 10/02/22 0021   haloperidol (HALDOL) tablet 5 mg  5 mg Oral BID PRN Jaynie Bream, RPH   5 mg at 10/02/22 0017    Or   haloperidol lactate (HALDOL) injection 5 mg  5 mg Intramuscular BID PRN Jaynie Bream, RPH   5 mg at 09/24/22 0217   ondansetron (ZOFRAN) injection 4 mg  4 mg Intravenous Once Manuela Schwartz, NP       pravastatin (PRAVACHOL) tablet 40 mg  40 mg Oral QHS Cox, Amy N, DO   40 mg at 10/01/22 2106   QUEtiapine (SEROQUEL) tablet 100 mg  100 mg Oral TID Charm Rings, NP   100 mg at 10/02/22 8119   rOPINIRole (REQUIP) tablet 0.5 mg  0.5 mg Oral QHS Glynda Soliday H, NP   0.5 mg at 10/01/22 2106   tamsulosin (FLOMAX) capsule 0.4 mg  0.4 mg Oral QPC supper Cox, Amy N, DO   0.4 mg at 10/01/22 1749   traZODone (DESYREL) tablet 50 mg  50 mg Oral QHS PRN Willeen Cass, Rolin Schult H, NP   50 mg at 10/02/22 0017    Musculoskeletal: Strength & Muscle Tone: within normal limits Gait & Station:  Did not observe  Patient leans: N/A            Psychiatric Specialty Exam:  Presentation  General Appearance:  Disheveled  Eye Contact: None  Speech: Slurred  Speech Volume: Decreased  Handedness: Right   Mood and Affect  Mood: Labile; Anxious  Affect: Labile   Thought Process  Thought Processes: Disorganized  Descriptions of Associations:Loose  Orientation:Partial (Oriented to self only)  Thought Content:Other (comment) (Minimal conversaton with this Clinical research associate)  History of Schizophrenia/Schizoaffective disorder:No data recorded Duration of Psychotic Symptoms:No data recorded Hallucinations:No data recorded  Ideas of Reference:Other (comment) (Unablel to assess)  Suicidal Thoughts:No data recorded  Homicidal Thoughts:No data recorded   Sensorium  Memory: Immediate Poor; Recent Poor; Remote Poor  Judgment: Impaired  Insight: Lacking   Executive Functions  Concentration: Poor  Attention Span: Poor  Recall: Other (comment) (Unable to assess)  Fund of Knowledge: Other (comment) (Unable to assess)  Language: Poor   Psychomotor Activity   Psychomotor Activity:No data recorded   Assets  Assets: Financial Resources/Insurance; Social Support   Sleep  Sleep:No data recorded   Physical Exam: Physical Exam Vitals and nursing note reviewed.  HENT:     Head: Normocephalic.     Nose: Nose normal.  Pulmonary:     Effort: Pulmonary effort is normal.  Musculoskeletal:        General: Normal range of motion.     Cervical back: Normal range of motion.  Skin:    General: Skin is dry.  Neurological:     Mental Status: He is alert. He is disoriented.     Comments: Oriented to self only   Psychiatric:        Mood and Affect: Affect is labile and blunt.        Speech: Speech is tangential.        Behavior: Behavior is cooperative.        Cognition and Memory: Cognition is impaired. Memory is impaired. He exhibits impaired recent memory and impaired remote memory.        Judgment: Judgment is impulsive.    ROS Blood pressure 121/61, pulse 61, temperature 98.7 F (37.1 C), resp. rate 18, height 5\' 10"  (1.778 m), weight 78.2 kg, SpO2 97 %. Body mass index is 24.74 kg/m.  Treatment Plan Summary: There is no indication patient would benefit from geriatric inpatient psychiatric admission at this time. Daily contact with patient to assess and evaluate symptoms and progress in treatment, Medication management, and Plan :  Dementia with behavioral disturbance: Seroquel 100 mg TID Depakote 500 mg BID   Agitation: haloperidol 5 mg PO or IM PRN BID   Insomnia: Trazodone 50 mg QHS PRN at bedtime  Disposition: No evidence of imminent risk to self or others at present.   Patient does not meet criteria for psychiatric inpatient admission. Supportive therapy provided about ongoing stressors. SNF placement needed.   Norma Fredrickson, NP 10/02/2022 10:37 AM

## 2022-10-02 NOTE — Progress Notes (Addendum)
[facility] PROGRESS NOTE  Patient: Terry Sloan ZOX:[id number]   PCP: Pcp, No DOB: [birth year]   DOA: 08/08/2022   DOS: 10/02/2022    Subjective: No acute complaint.  No acute events overnight.  No nausea no vomiting.  Required 1 dose of Haldol last night.  Objective:   Vitals:   10/01/22 0950 10/01/22 1545 10/02/22 0605 10/02/22 1652  BP: 133/69 (!) 100/56 121/61 119/63  Pulse: 80 73 61 80  Resp: 16 18 18 16   Temp: 97.8 F (36.6 C) 97.9 F (36.6 C) 98.7 F (37.1 C) 98.2 F (36.8 C)  TempSrc: Oral     SpO2: 100% 96% 97% 98%  Weight:      Height:        Clear to auscultation. Bowel sound present. BM yesterday.  Assessment and plan: Active Issues Dementia. Behavioral issues. Intermittent agitation. On Seroquel, Depakote and Aricept. Trazodone added. Continue one-to-one sitter for now. May require higher dose of Seroquel. Currently on Haldol as needed.  BPH. Continue Flomax. Ensure the patient does not have any retention issues.  Vitamin B12 deficiency. Currently being replaced.  Disposition. I have reached out to social worker team.  Patient is medically stable.  Currently on Haldol as needed.  Per my discussion with multiple RN staff patient is easily redirectable.  Brief Hospital course Terry Sloan is 82 year old male is a 82 year old male with history of dementia, behavioral disturbance, hyperlipidemia, BPH, insomnia, depression, anxiety, who presents to emergency department from [facility] rehab dementia department on 08/08/2022 for chief concerns of aggression with aggressive outburst towards staff.  Patient was initially IVC.  Behavioral health was consulted and determined that patient was not a danger to self or others.  Patient was pending placement, however was declined due to as needed Haldol doses were given.  On day of admission, nursing noticed that patient's temperature was low, decrease responsiveness/change in mentation, and hypotension.  Serum  sodium on day of admission was 139, potassium 4.0, chloride 109, bicarb 24, BUN of 25, serum creatinine 1.13, eGFR greater than 60, nonfasting blood glucose 92, WBC 5.8, hemoglobin 13.8, platelets of 259.  Blood cultures, are in process.  Procalcitonin was less than 0.10, lactic acid was within normal limits.  Magnesium level was 2.1.  UA was negative for leukocytes and nitrates.  Portable chest x-ray was read as rotated portable exam with lower lung volumes.  CT of the head without contrast: Was read as no evidence of acute intracranial abnormality.  Redemonstrated chronic cortical/subcortical infarcts within the right frontal and left occipital lobes.  Redemonstrated chronic infarct within the left cerebellar hemisphere.  Per EDP patient has not required one-to-one sitter over the last few days.  ED treatment: Patient received LR 2 L bolus, LR IVF infusion with minimal improvement in blood pressure.   Principal Problem:   Dementia with behavioral disturbance (HCC) Active Problems:   Mixed hyperlipidemia   History of TIA (transient ischemic attack)   Anxiety and depression   BPH (benign prostatic hyperplasia)   OAB (overactive bladder)   Altered mental status   Hypothermia   Restless leg   Vitamin B12 deficiency   Parainfluenza infection    Author: Lynden Oxford, MD [facility] 10/02/2022 6:31 PM   If 7PM-7AM, please contact night-coverage at [email or web address]

## 2022-10-02 NOTE — Progress Notes (Signed)
Mobility Specialist - Progress Note   10/02/22 1359  Mobility  Activity  (exercises in chair)  Level of Assistance Independent  Assistive Device None  Range of Motion/Exercises Active  Activity Response Tolerated well  $Mobility charge 1 Mobility   Pt sitting in the recliner upon entry, utilizing RA. Sitter informed MS that Pt had just returned from amb in the hallway--MS opted for exercises in the recliner. Pt activity completed BLE leg raises (6), pillow abductor squeezes and ankle circles/pumps. Pt left sitting in recliner with needs within reach, sitter present at bedside.   Zetta Bills Mobility Specialist 10/02/22 2:04 PM

## 2022-10-03 DIAGNOSIS — F03918 Unspecified dementia, unspecified severity, with other behavioral disturbance: Secondary | ICD-10-CM | POA: Diagnosis not present

## 2022-10-03 NOTE — Consult Note (Signed)
[facility] Face-to-Face Psychiatry Consult   Reason for Consult:  Psychiatric Evaluation  Referring Physician:  Dr, Myriam Forehand  Patient Identification: Terry Sloan MRN:  [id number] Principal Diagnosis: Dementia with behavioral disturbance [facility]) Diagnosis:  Principal Problem:   Dementia with behavioral disturbance (HCC) Active Problems:   Mixed hyperlipidemia   History of TIA (transient ischemic attack)   Anxiety and depression   BPH (benign prostatic hyperplasia)   OAB (overactive bladder)   Altered mental status   Hypothermia   Restless leg   Vitamin B12 deficiency   Parainfluenza infection   Total Time spent with patient: 30 minutes  Subjective:    Terry Sloan is a 82 y.o. male patient with a known history of dementia exhibiting severe behavioral disturbances, including aggression and physical violence, necessitating frequent administration of as needed IM medications.  05/02: The patient presents today seated in a Geri chair at the bedside, alert and oriented to self only.  He is accompanied by his son and daughter, as well as a Comptroller for fall precautions. He appears to be in a good mood, laughing and joking with this Chartered loss adjuster.  No agitation or restlessness is noted during the encounter.  The patient's son and daughter report that they are pleased with his presentation today and believe he is doing much better.  According to the [facility], the patient received as needed trazodone at approximately 2017 last night to aid in sleep support.  He has not required as needed haloperidol in the past 24 hours. Psych team will continue to follow the patient to assess and manage any behavioral concerns.   HPI per Dr Toni Amend on 4/23: 82 year old man with a history of advanced dementia who has been seen previously by the behavioral health team for agitation.  Has developed intermittent agitated behaviors it seems over the last couple months.  Reports that last night the patient became agitated in the late night  hours had to be given as needed haloperidol.  When I came to see him today the patient was sitting up in bed and was allowing his relative to feed him but was not speaking making eye contact or showing any other signs of alertness.  Family reports that he had been like that all day long.  They say this is typical after he has been giving haloperidol.  They understand that he has intermittent spells of agitation as well.  Their only concern with medication is that the Haldol may be over sedating and also that his blood pressure seems to go low intermittently.  Patient himself unable to make any complaint.  Was not agitated at least with his family.  Primary issue at this point seems to be largely placement.    Past Psychiatric History: History of dementia with behavioral disturbances, worsening over the past 3 years. Admitted to [facility] 07/27/22 with aggressive behavior in the setting of worsening dementia. He presented to [facility] ED March 2024 with similar behaviors.   Risk to Self:   Risk to Others:   Prior Inpatient Therapy:   Prior Outpatient Therapy:    Past Medical History:  Past Medical History:  Diagnosis Date   Arthritis    Dementia (HCC)    per EMS   GERD (gastroesophageal reflux disease)    [name]   History of kidney stones    Hyperlipidemia    Macular degeneration    PVD (peripheral vascular disease) (HCC)    Stroke (HCC)    TIA      Past Surgical  History:  Procedure Laterality Date   Bone turmor Left    foreheah   EYE SURGERY Bilateral    cataract removal   FRACTURE SURGERY Left    leg   pins   KNEE ARTHROPLASTY     KNEE SURGERY Left    NECK SURGERY     Tumor reomoved from head/neck in the 60s   REPLACEMENT TOTAL KNEE Left 07/14/2018   ROTATOR CUFF REPAIR Right    times 2   TOTAL KNEE ARTHROPLASTY WITH HARDWARE REMOVAL Left 02/14/2017   Procedure: Removal Left Tibia Nail, Cemented Total Knee Arthroplasty;  Surgeon: Eldred Manges, MD;  Location: [facility] OR;  Service:  Orthopedics;  Laterality: Left;   Family History:  Family History  Problem Relation Age of Onset   Stroke Neg Hx    Family Psychiatric  History: Unknown Social History:  Social History   Substance and Sexual Activity  Alcohol Use Yes   Comment: Occasional beer     Social History   Substance and Sexual Activity  Drug Use No    Social History   Socioeconomic History   Marital status: Media planner    Spouse name: Not on file   Number of children: 9   Years of education: Not on file   Highest education level: Not on file  Occupational History   Occupation: Retired  Tobacco Use   Smoking status: Former    Packs/day: 1.50    Years: 20.00    Additional pack years: 0.00    Total pack years: 30.00    Types: Cigarettes   Smokeless tobacco: Never   Tobacco comments:    Quit 20+ year ago  Vaping Use   Vaping Use: Never used  Substance and Sexual Activity   Alcohol use: Yes    Comment: Occasional beer   Drug use: No   Sexual activity: Not Currently  Other Topics Concern   Not on file  Social History Narrative   Lives at home w/ his significant other   Right-handed   Caffeine: some coffee, 3 diet Mt Dew's per day   Social Determinants of Health   Financial Resource Strain: Low Risk  (07/23/2022)   Overall Financial Resource Strain (CARDIA)    Difficulty of Paying Living Expenses: Not hard at all  Food Insecurity: Patient Unable To Answer (09/07/2022)   Hunger Vital Sign    Worried About Running Out of Food in the Last Year: Patient unable to answer    Ran Out of Food in the Last Year: Patient unable to answer  Transportation Needs: Patient Unable To Answer (09/07/2022)   PRAPARE - Transportation    Lack of Transportation (Medical): Patient unable to answer    Lack of Transportation (Non-Medical): Patient unable to answer  Physical Activity: Insufficiently Active (07/23/2022)   Exercise Vital Sign    Days of Exercise per Week: 3 days    Minutes of Exercise per  Session: 30 min  Stress: No Stress Concern Present (07/23/2022)   [employer] of Occupational Health - Occupational Stress Questionnaire    Feeling of Stress : Not at all  Social Connections: Socially Isolated (07/23/2022)   Social Connection and Isolation Panel [NHANES]    Frequency of Communication with Friends and Family: More than three times a week    Frequency of Social Gatherings with Friends and Family: Never    Attends Religious Services: Never    Database administrator or Organizations: No    Attends Banker Meetings: Never  Marital Status: Widowed   Additional Social History:    Allergies:   Allergies  Allergen Reactions   Celebrex [Celecoxib] Other (See Comments)    Leg swelling, broke out in rash    Labs: No results found for this or any previous visit (from the past 48 hour(s)).  Current Facility-Administered Medications  Medication Dose Route Frequency Provider Last Rate Last Admin   acetaminophen (TYLENOL) tablet 650 mg  650 mg Oral Q6H PRN Joni Norrod H, NP   650 mg at 10/01/22 1751   cyanocobalamin (VITAMIN B12) tablet 1,000 mcg  1,000 mcg Oral Daily Lurene Shadow, MD   1,000 mcg at 10/03/22 1610   divalproex (DEPAKOTE SPRINKLE) capsule 500 mg  500 mg Oral Q12H Jaynie Bream, RPH   500 mg at 10/03/22 1001   donepezil (ARICEPT) tablet 5 mg  5 mg Oral Daily Cox, Amy N, DO   5 mg at 10/03/22 1002   enoxaparin (LOVENOX) injection 40 mg  40 mg Subcutaneous Q24H Cox, Amy N, DO   40 mg at 10/02/22 2126   feeding supplement (ENSURE ENLIVE / ENSURE PLUS) liquid 237 mL  237 mL Oral BID BM Cox, Amy N, DO   237 mL at 10/03/22 1002   guaiFENesin-dextromethorphan (ROBITUSSIN DM) 100-10 MG/5ML syrup 5 mL  5 mL Oral Q6H PRN Lurene Shadow, MD   5 mL at 10/02/22 0021   haloperidol (HALDOL) tablet 5 mg  5 mg Oral BID PRN Jaynie Bream, RPH   5 mg at 10/02/22 0017   Or   haloperidol lactate (HALDOL) injection 5 mg  5 mg Intramuscular BID PRN  Jaynie Bream, RPH   5 mg at 09/24/22 0217   pravastatin (PRAVACHOL) tablet 40 mg  40 mg Oral QHS Cox, Amy N, DO   40 mg at 10/02/22 2116   QUEtiapine (SEROQUEL) tablet 100 mg  100 mg Oral TID Charm Rings, NP   100 mg at 10/03/22 1000   rOPINIRole (REQUIP) tablet 0.5 mg  0.5 mg Oral QHS Ahilyn Nell H, NP   0.5 mg at 10/02/22 2117   tamsulosin (FLOMAX) capsule 0.4 mg  0.4 mg Oral QPC supper Cox, Amy N, DO   0.4 mg at 10/02/22 2118   traZODone (DESYREL) tablet 50 mg  50 mg Oral QHS PRN Thurston Hole H, NP   50 mg at 10/02/22 2117    Musculoskeletal: Strength & Muscle Tone: within normal limits Gait & Station:  Did not observe  Patient leans: N/A            Psychiatric Specialty Exam:  Presentation  General Appearance:  Disheveled  Eye Contact: None  Speech: Slurred  Speech Volume: Decreased  Handedness: Right   Mood and Affect  Mood: Labile; Anxious  Affect: Labile   Thought Process  Thought Processes: Disorganized  Descriptions of Associations:Loose  Orientation:Partial (Oriented to self only)  Thought Content:Other (comment) (Minimal conversaton with this Clinical research associate)  History of Schizophrenia/Schizoaffective disorder:No data recorded Duration of Psychotic Symptoms:No data recorded Hallucinations:No data recorded  Ideas of Reference:Other (comment) (Unablel to assess)  Suicidal Thoughts:No data recorded  Homicidal Thoughts:No data recorded   Sensorium  Memory: Immediate Poor; Recent Poor; Remote Poor  Judgment: Impaired  Insight: Lacking   Executive Functions  Concentration: Poor  Attention Span: Poor  Recall: Other (comment) (Unable to assess)  Fund of Knowledge: Other (comment) (Unable to assess)  Language: Poor   Psychomotor Activity  Psychomotor Activity:No data recorded   Assets  Assets: Financial  Resources/Insurance; Social Support   Sleep  Sleep:No data recorded   Physical  Exam: Physical Exam Vitals and nursing note reviewed.  HENT:     Head: Normocephalic.     Nose: Nose normal.  Pulmonary:     Effort: Pulmonary effort is normal.  Musculoskeletal:        General: Normal range of motion.     Cervical back: Normal range of motion.  Skin:    General: Skin is dry.  Neurological:     Mental Status: He is alert. He is disoriented.     Comments: Oriented to self only   Psychiatric:        Mood and Affect: Affect is labile and blunt.        Speech: Speech is tangential.        Behavior: Behavior is cooperative.        Cognition and Memory: Cognition is impaired. Memory is impaired. He exhibits impaired recent memory and impaired remote memory.        Judgment: Judgment is impulsive.    ROS Blood pressure 119/63, pulse 80, temperature 98.2 F (36.8 C), resp. rate 16, height 5\' 10"  (1.778 m), weight 78.2 kg, SpO2 98 %. Body mass index is 24.74 kg/m.  Treatment Plan Summary: There is no indication patient would benefit from geriatric inpatient psychiatric admission at this time. Daily contact with patient to assess and evaluate symptoms and progress in treatment, Medication management, and Plan :  Dementia with behavioral disturbance: Seroquel 100 mg TID Depakote 500 mg BID   Agitation: haloperidol 5 mg PO or IM PRN BID   Insomnia: Trazodone 50 mg QHS PRN at bedtime  Disposition: No evidence of imminent risk to self or others at present.   Patient does not meet criteria for psychiatric inpatient admission. Supportive therapy provided about ongoing stressors. SNF placement needed.   Norma Fredrickson, NP 10/03/2022 10:52 AM

## 2022-10-03 NOTE — Progress Notes (Signed)
[facility] PROGRESS NOTE  Patient: Terry Sloan GNF:[id number]   PCP: Pcp, No DOB: 03/13/1941   DOA: 08/08/2022   DOS: 10/03/2022    Subjective: No nausea no vomiting or no fever no chills.  Family at bedside.  Objective:   Vitals:   10/01/22 0950 10/01/22 1545 10/02/22 0605 10/02/22 1652  BP: 133/69 (!) 100/56 121/61 119/63  Pulse: 80 73 61 80  Resp: 16 18 18 16   Temp: 97.8 F (36.6 C) 97.9 F (36.6 C) 98.7 F (37.1 C) 98.2 F (36.8 C)  TempSrc: Oral     SpO2: 100% 96% 97% 98%  Weight:      Height:        Clear to auscultation. S1-S2 present. No asterixis.  Assessment and plan: Active Issues Dementia with behavioral disturbances. Currently on Seroquel 100 mg 3 times daily. If the patient requires further doses of haloperidol as needed then we will change the Seroquel dose at night to 200 mg. Continue to monitor for now.  Brief Hospital course Mr. Can Lucci is 82 year old male is a 82 year old male with history of dementia, behavioral disturbance, hyperlipidemia, BPH, insomnia, depression, anxiety, who presents to emergency department from [facility] rehab dementia department on 08/08/2022 for chief concerns of aggression with aggressive outburst towards staff.  Patient was initially IVC.  Behavioral health was consulted and determined that patient was not a danger to self or others.  Patient was pending placement, however was declined due to as needed Haldol doses were given.  On day of admission, nursing noticed that patient's temperature was low, decrease responsiveness/change in mentation, and hypotension.  Serum sodium on day of admission was 139, potassium 4.0, chloride 109, bicarb 24, BUN of 25, serum creatinine 1.13, eGFR greater than 60, nonfasting blood glucose 92, WBC 5.8, hemoglobin 13.8, platelets of 259.  Blood cultures, are in process.  Procalcitonin was less than 0.10, lactic acid was within normal limits.  Magnesium level was 2.1.  UA was negative for  leukocytes and nitrates.  Portable chest x-ray was read as rotated portable exam with lower lung volumes.  CT of the head without contrast: Was read as no evidence of acute intracranial abnormality.  Redemonstrated chronic cortical/subcortical infarcts within the right frontal and left occipital lobes.  Redemonstrated chronic infarct within the left cerebellar hemisphere.  Per EDP patient has not required one-to-one sitter over the last few days.  ED treatment: Patient received LR 2 L bolus, LR IVF infusion with minimal improvement in blood pressure.   Principal Problem:   Dementia with behavioral disturbance (HCC) Active Problems:   Mixed hyperlipidemia   History of TIA (transient ischemic attack)   Anxiety and depression   BPH (benign prostatic hyperplasia)   OAB (overactive bladder)   Altered mental status   Hypothermia   Restless leg   Vitamin B12 deficiency   Parainfluenza infection    Author: Lynden Oxford, MD [facility] 10/03/2022 6:16 PM   If 7PM-7AM, please contact night-coverage at [email or web address]

## 2022-10-03 NOTE — TOC Progression Note (Addendum)
Transition of Care [facility]) - Progression Note    Patient Details  Name: Terry Sloan MRN: [id number] Date of Birth: [birth year]  Transition of Care [facility] CM/SW Contact  Darleene Cleaver, [place] Phone Number: 10/02/2022 3:30pm  Clinical Narrative:     CSW spoke to Rudean Curt again from [facility] to see if they would reconsider accepting patient.  Per Baird Lyons, she will check with more facilities to see if they can accept patient.  Per Baird Lyons, if Haldol became scheduled, that could help with placement options for patient.  Expected Discharge Plan:  (TBD) Barriers to Discharge: Continued Medical Work up  Expected Discharge Plan and Services  SNF rehab then LTC.                                             Social Determinants of Health (SDOH) Interventions SDOH Screenings   Food Insecurity: Patient Unable To Answer (09/07/2022)  Housing: Low Risk  (09/07/2022)  Transportation Needs: Patient Unable To Answer (09/07/2022)  Utilities: Not At Risk (07/23/2022)  Alcohol Screen: Low Risk  (07/23/2022)  Depression (PHQ2-9): Low Risk  (07/23/2022)  Recent Concern: Depression (PHQ2-9) - Medium Risk (06/17/2022)  Financial Resource Strain: Low Risk  (07/23/2022)  Physical Activity: Insufficiently Active (07/23/2022)  Social Connections: Socially Isolated (07/23/2022)  Stress: No Stress Concern Present (07/23/2022)  Tobacco Use: Medium Risk (09/11/2022)    Readmission Risk Interventions     No data to display

## 2022-10-04 DIAGNOSIS — F03918 Unspecified dementia, unspecified severity, with other behavioral disturbance: Secondary | ICD-10-CM | POA: Diagnosis not present

## 2022-10-04 MED ORDER — TRAZODONE HCL 50 MG PO TABS
50.0000 mg | ORAL_TABLET | Freq: Every day | ORAL | Status: DC
  Administered 2022-10-04 – 2022-10-11 (×8): 50 mg via ORAL
  Filled 2022-10-04 (×8): qty 1

## 2022-10-04 MED ORDER — DOCUSATE SODIUM 100 MG PO CAPS
100.0000 mg | ORAL_CAPSULE | Freq: Two times a day (BID) | ORAL | Status: DC
  Administered 2022-10-04 – 2022-10-06 (×5): 100 mg via ORAL
  Filled 2022-10-04 (×7): qty 1

## 2022-10-04 MED ORDER — FUROSEMIDE 20 MG PO TABS
20.0000 mg | ORAL_TABLET | Freq: Once | ORAL | Status: AC
  Administered 2022-10-05: 20 mg via ORAL
  Filled 2022-10-04: qty 1

## 2022-10-04 NOTE — Progress Notes (Signed)
[facility] Progress Note Patient: Terry Sloan:[id number] DOB: 28-Sep-1940 DOA: 08/08/2022  DOS: the patient was seen and examined on 10/04/2022  Brief hospital course: [name] is 82 year old male is a 82 year old male with history of dementia, behavioral disturbance, hyperlipidemia, BPH, insomnia, depression, anxiety, who presents to emergency department from [facility] rehab dementia department on 08/08/2022 for chief concerns of aggression with aggressive outburst towards staff. [facility]ed to [name] on 2/24 due to agitation.  Was in the ED until 3/1 as he required SNF placement.  Sent to [facility] and [name].  On 3/7 returned back to ER for confusion, combative with staff.  Was IVC in the ED.Behavioral health was consulted.  TOC was consulted for placement to SNF again. however, was declined by facilities due to need for as needed Haldol.  On 4/5 EDP requesting admission for hypotension, hypothermia and change in mentation.  Symptoms resolved by 4/6. Workup was unremarkable for any significant infectious etiology. Patient continues to be seen by psychiatry to help manage his agitation. During the hospitalization had parainfluenza viral infection on 4/27 treated conservatively. As of 5/2 has not required any as needed Haldol dose. Behavior appears to be well-controlled.  Not agitated.  Not combative. Assessment and Plan: Agitation. Dementia with behavioral disturbances. Appreciate psychiatry consultation. Patient is currently on Seroquel, Depakote, Aricept. Trazodone for insomnia also ordered.  Switch from as needed to scheduled. Currently has a sitter primarily for safety and redirection. Not a candidate for inpatient psych admission. No evidence of infectious etiology right now. If has recurrence of agitation would recommend to increase Seroquel dose at nighttime to 200 mg.  Recent parainfluenza infection. Treated conservatively.  Diagnosed on  4/27.  Hypotension, hypothermia. Reason for admission. Workup is unremarkable. Vitals are improved significantly.  Bilateral lower extremity edema. Will provide 1 dose of Lasix during the daytime on 5/4.  Restless leg. Continue Requip at nighttime.  Anxiety and depression. Appreciate psychiatry assistance. Continue current regimen.  Vitamin B12 deficiency. On oral supplements.  BPH. Continuing Flomax.   Subjective: No acute complaint.  No nausea or vomiting.  No fever or chills.  Physical Exam: No distress. Ambulating in the hallway with the staff. Bilateral lower extremity edema seen. Neuro: Alert and oriented to self, no new focal deficit  Data Reviewed: I have Reviewed nursing notes, Vitals, and Lab results. I have ordered test including CBC and BMP  .   Disposition: Status is: Inpatient Remains inpatient appropriate because: Awaiting safe discharge plan.  Needing SNF.  Has not required any Haldol in the last 48 hours.  enoxaparin (LOVENOX) injection 40 mg Start: 09/06/22 2200   Family Communication: No one at bedside.  Discussed with family on 5/2. Level of care: Telemetry Medical continue for now. Vitals:   10/01/22 1545 10/02/22 0605 10/02/22 1652 10/03/22 2023  BP: (!) 100/56 121/61 119/63 (!) 104/49  Pulse: 73 61 80 76  Resp: 18 18 16 16   Temp: 97.9 F (36.6 C) 98.7 F (37.1 C) 98.2 F (36.8 C) 98.3 F (36.8 C)  TempSrc:      SpO2: 96% 97% 98% 99%  Weight:      Height:         Author: Lynden Oxford, MD 10/04/2022 4:43 PM  Please look on [email or web address] to find out who is on call.

## 2022-10-04 NOTE — Progress Notes (Signed)
Mobility Specialist - Progress Note   10/04/22 1114  Mobility  Activity Ambulated with assistance in hallway  Level of Assistance Standby assist, set-up cues, supervision of patient - no hands on  Assistive Device Front wheel walker  Distance Ambulated (ft) 340 ft  Activity Response Tolerated well  $Mobility charge 1 Mobility   Pt sitting in the recliner upon entry, utilizing RA. Pt agreeable to amb in the hallway this date. Pt STS to RW light MinA. Pt amb two laps around the NS, expressing "I feel good". Pt returned to room, left sitting EOB with sitter present at bedside.   Zetta Bills Mobility Specialist 10/04/22 11:21 AM

## 2022-10-04 NOTE — Consult Note (Signed)
[facility] Face-to-Face Psychiatry Consult   Reason for Consult:  Psychiatric Evaluation  Referring Physician:  Dr, Myriam Forehand  Patient Identification: Terry Sloan MRN:  [id number] Principal Diagnosis: Dementia with behavioral disturbance [facility]) Diagnosis:  Principal Problem:   Dementia with behavioral disturbance (HCC) Active Problems:   Mixed hyperlipidemia   History of TIA (transient ischemic attack)   Anxiety and depression   BPH (benign prostatic hyperplasia)   OAB (overactive bladder)   Altered mental status   Hypothermia   Restless leg   Vitamin B12 deficiency   Parainfluenza infection   Total Time spent with patient: 15 minutes  Subjective: "Come on in and have a seat."  The client was pleasant when answering his door and invited the provider to come and in.  He thought she was his daughter at first, a little disappointed per facial expression.  The sitter stated "He's better, whole different person."  She reported he has been cooperative and allowed her to bath him, pleasant.  "He's fidgety because he wants a haircut" which the client confirmed.  No changes made to his medications and infrequent use of PRNs.  Reconsult psych as needed.   Terry Sloan is a 82 y.o. male patient with a known history of dementia exhibiting severe behavioral disturbances, including aggression and physical violence, necessitating frequent administration of as needed IM medications.  05/02: The patient presents today seated in a Geri chair at the bedside, alert and oriented to self only.  He is accompanied by his son and daughter, as well as a Comptroller for fall precautions. He appears to be in a good mood, laughing and joking with this Chartered loss adjuster.  No agitation or restlessness is noted during the encounter.  The patient's son and daughter report that they are pleased with his presentation today and believe he is doing much better.  According to the [facility], the patient received as needed trazodone at approximately 2017 last  night to aid in sleep support.  He has not required as needed haloperidol in the past 24 hours. Psych team will continue to follow the patient to assess and manage any behavioral concerns.   HPI per Dr Toni Amend on 4/23: 82 year old man with a history of advanced dementia who has been seen previously by the behavioral health team for agitation.  Has developed intermittent agitated behaviors it seems over the last couple months.  Reports that last night the patient became agitated in the late night hours had to be given as needed haloperidol.  When I came to see him today the patient was sitting up in bed and was allowing his relative to feed him but was not speaking making eye contact or showing any other signs of alertness.  Family reports that he had been like that all day long.  They say this is typical after he has been giving haloperidol.  They understand that he has intermittent spells of agitation as well.  Their only concern with medication is that the Haldol may be over sedating and also that his blood pressure seems to go low intermittently.  Patient himself unable to make any complaint.  Was not agitated at least with his family.  Primary issue at this point seems to be largely placement.    Past Psychiatric History: History of dementia with behavioral disturbances, worsening over the past 3 years. Admitted to [facility] 07/27/22 with aggressive behavior in the setting of worsening dementia. He presented to [facility] ED March 2024 with similar behaviors.   Risk to Self:  Risk to Others:   Prior Inpatient Therapy:   Prior Outpatient Therapy:    Past Medical History:  Past Medical History:  Diagnosis Date   Arthritis    Dementia (HCC)    per EMS   GERD (gastroesophageal reflux disease)    [name]   History of kidney stones    Hyperlipidemia    Macular degeneration    PVD (peripheral vascular disease) (HCC)    Stroke (HCC)    TIA      Past Surgical History:  Procedure Laterality Date    Bone turmor Left    foreheah   EYE SURGERY Bilateral    cataract removal   FRACTURE SURGERY Left    leg   pins   KNEE ARTHROPLASTY     KNEE SURGERY Left    NECK SURGERY     Tumor reomoved from head/neck in the 60s   REPLACEMENT TOTAL KNEE Left 07/14/2018   ROTATOR CUFF REPAIR Right    times 2   TOTAL KNEE ARTHROPLASTY WITH HARDWARE REMOVAL Left 02/14/2017   Procedure: Removal Left Tibia Nail, Cemented Total Knee Arthroplasty;  Surgeon: Eldred Manges, MD;  Location: [facility] OR;  Service: Orthopedics;  Laterality: Left;   Family History:  Family History  Problem Relation Age of Onset   Stroke Neg Hx    Family Psychiatric  History: Unknown Social History:  Social History   Substance and Sexual Activity  Alcohol Use Yes   Comment: Occasional beer     Social History   Substance and Sexual Activity  Drug Use No    Social History   Socioeconomic History   Marital status: Media planner    Spouse name: Not on file   Number of children: 9   Years of education: Not on file   Highest education level: Not on file  Occupational History   Occupation: Retired  Tobacco Use   Smoking status: Former    Packs/day: 1.50    Years: 20.00    Additional pack years: 0.00    Total pack years: 30.00    Types: Cigarettes   Smokeless tobacco: Never   Tobacco comments:    Quit 20+ year ago  Vaping Use   Vaping Use: Never used  Substance and Sexual Activity   Alcohol use: Yes    Comment: Occasional beer   Drug use: No   Sexual activity: Not Currently  Other Topics Concern   Not on file  Social History Narrative   Lives at home w/ his significant other   Right-handed   Caffeine: some coffee, 3 diet Mt Dew's per day   Social Determinants of Health   Financial Resource Strain: Low Risk  (07/23/2022)   Overall Financial Resource Strain (CARDIA)    Difficulty of Paying Living Expenses: Not hard at all  Food Insecurity: Patient Unable To Answer (09/07/2022)   Hunger Vital Sign     Worried About Running Out of Food in the Last Year: Patient unable to answer    Ran Out of Food in the Last Year: Patient unable to answer  Transportation Needs: Patient Unable To Answer (09/07/2022)   PRAPARE - Transportation    Lack of Transportation (Medical): Patient unable to answer    Lack of Transportation (Non-Medical): Patient unable to answer  Physical Activity: Insufficiently Active (07/23/2022)   Exercise Vital Sign    Days of Exercise per Week: 3 days    Minutes of Exercise per Session: 30 min  Stress: No Stress Concern Present (07/23/2022)  [employer] of Occupational Health - Occupational Stress Questionnaire    Feeling of Stress : Not at all  Social Connections: Socially Isolated (07/23/2022)   Social Connection and Isolation Panel [NHANES]    Frequency of Communication with Friends and Family: More than three times a week    Frequency of Social Gatherings with Friends and Family: Never    Attends Religious Services: Never    Database administrator or Organizations: No    Attends Banker Meetings: Never    Marital Status: Widowed   Additional Social History:    Allergies:   Allergies  Allergen Reactions   Celebrex [Celecoxib] Other (See Comments)    Leg swelling, broke out in rash    Labs: No results found for this or any previous visit (from the past 48 hour(s)).  Current Facility-Administered Medications  Medication Dose Route Frequency Provider Last Rate Last Admin   acetaminophen (TYLENOL) tablet 650 mg  650 mg Oral Q6H PRN Willeen Cass, Christal H, NP   650 mg at 10/04/22 0115   cyanocobalamin (VITAMIN B12) tablet 1,000 mcg  1,000 mcg Oral Daily Lurene Shadow, MD   1,000 mcg at 10/04/22 1005   divalproex (DEPAKOTE SPRINKLE) capsule 500 mg  500 mg Oral Q12H Jaynie Bream, RPH   500 mg at 10/04/22 1004   docusate sodium (COLACE) capsule 100 mg  100 mg Oral BID Rolly Salter, MD   100 mg at 10/04/22 1005   donepezil (ARICEPT) tablet 5 mg  5  mg Oral Daily Cox, Amy N, DO   5 mg at 10/04/22 1005   enoxaparin (LOVENOX) injection 40 mg  40 mg Subcutaneous Q24H Cox, Amy N, DO   40 mg at 10/03/22 2102   feeding supplement (ENSURE ENLIVE / ENSURE PLUS) liquid 237 mL  237 mL Oral BID BM Cox, Amy N, DO   237 mL at 10/04/22 1006   guaiFENesin-dextromethorphan (ROBITUSSIN DM) 100-10 MG/5ML syrup 5 mL  5 mL Oral Q6H PRN Lurene Shadow, MD   5 mL at 10/02/22 0021   pravastatin (PRAVACHOL) tablet 40 mg  40 mg Oral QHS Cox, Amy N, DO   40 mg at 10/03/22 2102   QUEtiapine (SEROQUEL) tablet 100 mg  100 mg Oral TID Charm Rings, NP   100 mg at 10/04/22 1005   rOPINIRole (REQUIP) tablet 0.5 mg  0.5 mg Oral QHS Bennett, Christal H, NP   0.5 mg at 10/03/22 2102   tamsulosin (FLOMAX) capsule 0.4 mg  0.4 mg Oral QPC supper Cox, Amy N, DO   0.4 mg at 10/03/22 1757   traZODone (DESYREL) tablet 50 mg  50 mg Oral QHS Rolly Salter, MD        Musculoskeletal: Strength & Muscle Tone: within normal limits Gait & Station:  Did not observe  Patient leans: N/A            Psychiatric Specialty Exam:  Presentation  General Appearance:  Disheveled  Eye Contact: None  Speech: Slurred  Speech Volume: Decreased  Handedness: Right   Mood and Affect  Mood: Labile; Anxious  Affect: Labile   Thought Process  Thought Processes: Disorganized  Descriptions of Associations:Loose  Orientation:Partial (Oriented to self only)  Thought Content:Other (comment) (Minimal conversaton with this Clinical research associate)  History of Schizophrenia/Schizoaffective disorder:No data recorded Duration of Psychotic Symptoms:No data recorded Hallucinations:No data recorded  Ideas of Reference:Other (comment) (Unablel to assess)  Suicidal Thoughts:No data recorded  Homicidal Thoughts:No data recorded   Sensorium  Memory: Immediate Poor; Recent Poor; Remote Poor  Judgment: Impaired  Insight: Lacking   Executive Functions   Concentration: Poor  Attention Span: Poor  Recall: Other (comment) (Unable to assess)  Fund of Knowledge: Other (comment) (Unable to assess)  Language: Poor   Psychomotor Activity  Psychomotor Activity:No data recorded   Assets  Assets: Financial Resources/Insurance; Social Support   Sleep  Sleep:No data recorded   Physical Exam: Physical Exam Vitals and nursing note reviewed.  HENT:     Head: Normocephalic.     Nose: Nose normal.  Pulmonary:     Effort: Pulmonary effort is normal.  Musculoskeletal:        General: Normal range of motion.     Cervical back: Normal range of motion.  Skin:    General: Skin is dry.  Neurological:     Mental Status: He is alert. He is disoriented.     Comments: Oriented to self only   Psychiatric:        Mood and Affect: Affect is labile and blunt.        Speech: Speech is tangential.        Behavior: Behavior is cooperative.        Cognition and Memory: Cognition is impaired. Memory is impaired. He exhibits impaired recent memory and impaired remote memory.        Judgment: Judgment is impulsive.    ROS Blood pressure (!) 104/49, pulse 76, temperature 98.3 F (36.8 C), resp. rate 16, height 5\' 10"  (1.778 m), weight 78.2 kg, SpO2 99 %. Body mass index is 24.74 kg/m.  Treatment Plan Summary: There is no indication patient would benefit from geriatric inpatient psychiatric admission at this time.   Dementia with behavioral disturbance: Seroquel 100 mg TID Depakote 500 mg BID   Agitation: haloperidol 5 mg PO or IM PRN BID   Insomnia: Trazodone 50 mg QHS PRN at bedtime  Disposition: No evidence of imminent risk to self or others at present.   Patient does not meet criteria for psychiatric inpatient admission. Supportive therapy provided about ongoing stressors. SNF placement needed, reconsult psych as needed.  Nanine Means, NP 10/04/2022 1:56 PM

## 2022-10-05 DIAGNOSIS — F03918 Unspecified dementia, unspecified severity, with other behavioral disturbance: Secondary | ICD-10-CM | POA: Diagnosis not present

## 2022-10-05 LAB — MAGNESIUM: Magnesium: 2.2 mg/dL (ref 1.7–2.4)

## 2022-10-05 LAB — CBC
HCT: 35.1 % — ABNORMAL LOW (ref 39.0–52.0)
Hemoglobin: 11.4 g/dL — ABNORMAL LOW (ref 13.0–17.0)
MCH: 30.7 pg (ref 26.0–34.0)
MCHC: 32.5 g/dL (ref 30.0–36.0)
MCV: 94.6 fL (ref 80.0–100.0)
Platelets: 266 10*3/uL (ref 150–400)
RBC: 3.71 MIL/uL — ABNORMAL LOW (ref 4.22–5.81)
RDW: 12.1 % (ref 11.5–15.5)
WBC: 3.9 10*3/uL — ABNORMAL LOW (ref 4.0–10.5)
nRBC: 0 % (ref 0.0–0.2)

## 2022-10-05 LAB — BASIC METABOLIC PANEL
Anion gap: 7 (ref 5–15)
BUN: 26 mg/dL — ABNORMAL HIGH (ref 8–23)
CO2: 25 mmol/L (ref 22–32)
Calcium: 8.8 mg/dL — ABNORMAL LOW (ref 8.9–10.3)
Chloride: 105 mmol/L (ref 98–111)
Creatinine, Ser: 1.2 mg/dL (ref 0.61–1.24)
GFR, Estimated: >60 mL/min (ref >60)
Glucose, Bld: 86 mg/dL (ref 70–99)
Potassium: 4 mmol/L (ref 3.5–5.1)
Sodium: 137 mmol/L (ref 135–145)

## 2022-10-05 NOTE — Progress Notes (Signed)
PROGRESS NOTE  Terry Sloan ZOX:[id number] DOB: [birth year] DOA: 08/08/2022 PCP: Pcp, No  Brief History   [name] is 82 year old male is a 82 year old male with history of dementia, behavioral disturbance, hyperlipidemia, BPH, insomnia, depression, anxiety, who presents to emergency department from [facility] rehab dementia department on 08/08/2022 for chief concerns of aggression with aggressive outburst towards staff. Presented to [name] ED on 2/24 due to agitation.  Was in the ED until 3/1 as he required SNF placement.  Sent to [facility] and [name].  On 3/7 returned back to ER for confusion, combative with staff.  Was IVC in the ED.Behavioral health was consulted.  TOC was consulted for placement to SNF again. however, was declined by facilities due to need for as needed Haldol.   On 4/5 EDP requesting admission for hypotension, hypothermia and change in mentation.  Symptoms resolved by 4/6. Workup was unremarkable for any significant infectious etiology. Patient continues to be seen by psychiatry to help manage his agitation. During the hospitalization had parainfluenza viral infection on 4/27 treated conservatively. As of 5/2 has not required any as needed Haldol dose. Behavior appears to be well-controlled.  Not agitated.  Not combative.  A & P  Agitation. Dementia with behavioral disturbances. Appreciate psychiatry consultation. Patient is currently on Seroquel, Depakote, Aricept. Trazodone for insomnia also ordered.  Switch from as needed to scheduled. Currently has a sitter primarily for safety and redirection. Not a candidate for inpatient psych admission. No evidence of infectious etiology right now. If has recurrence of agitation would recommend to increase Seroquel dose at nighttime to 200 mg.   Recent parainfluenza infection. Treated conservatively.  Diagnosed on 4/27.   Hypotension, hypothermia. Resolved Reason for admission. Workup is  unremarkable. Vitals are improved significantly.   Bilateral lower extremity edema. Will provide 1 dose of Lasix during the daytime on 5/4.   Restless leg. Continue Requip at nighttime.   Anxiety and depression. Appreciate psychiatry assistance. Continue current regimen.   Vitamin B12 deficiency. On oral supplements.   BPH. Continuing Flomax.  I have seen and examined this patient myself. I have spent 34 minutes in his evaluation and care.  DVT prophylaxis: Lovenox Code Status: Full Code Family Communication: None available Disposition Plan: Placement being sought by TOC.    Terry Samaras, DO [facility] Direct contact: see [email or web address]  7PM-7AM contact night coverage as above 10/05/2022, 4:55 PM  LOS: 29 days   Consultants  Psychiatry  Procedures  None  Antibiotics   Anti-infectives (From admission, onward)    None        Subjective  The patient is sitting up on the edge of his bed. No new complaints.  Objective   Vitals:  Vitals:   10/04/22 2032 10/05/22 0333  BP: 129/74 117/69  Pulse: 79 69  Resp: 18 20  Temp: 97.8 F (36.6 C) 97.8 F (36.6 C)  SpO2: 99% 97%    Exam:  Constitutional:  The patient is awake, alert, and oriented x 3. No acute distress. Respiratory:  No increased work of breathing. No wheezes, rales, or rhonchi No tactile fremitus Cardiovascular:  Regular rate and rhythm No murmurs, ectopy, or gallups. No lateral PMI. No thrills. Abdomen:  Abdomen is soft, non-tender, non-distended No hernias, masses, or organomegaly Normoactive bowel sounds.  Musculoskeletal:  No cyanosis, clubbing, or edema Skin:  No rashes, lesions, ulcers palpation of skin: no induration or nodules Neurologic:  CN 2-12 intact Sensation all 4 extremities intact  I have personally reviewed the following:   Today's Data   Vitals:   10/04/22 2032 10/05/22 0333  BP: 129/74 117/69  Pulse: 79 69  Resp: 18 20  Temp: 97.8 F (36.6 C)  97.8 F (36.6 C)  SpO2: 99% 97%     Lab Data  CBC    Component Value Date/Time   WBC 3.9 (L) 10/05/2022 0553   RBC 3.71 (L) 10/05/2022 0553   HGB 11.4 (L) 10/05/2022 0553   HGB 13.3 07/26/2022 1220   HCT 35.1 (L) 10/05/2022 0553   HCT 39.9 07/26/2022 1220   PLT 266 10/05/2022 0553   PLT 232 07/26/2022 1220   MCV 94.6 10/05/2022 0553   MCV 94 07/26/2022 1220   MCH 30.7 10/05/2022 0553   MCHC 32.5 10/05/2022 0553   RDW 12.1 10/05/2022 0553   RDW 12.3 07/26/2022 1220   LYMPHSABS 1.0 09/25/2022 0531   LYMPHSABS 1.1 07/26/2022 1220   MONOABS 1.0 09/25/2022 0531   EOSABS 0.1 09/25/2022 0531   EOSABS 0.1 07/26/2022 1220   BASOSABS 0.0 09/25/2022 0531   BASOSABS 0.0 07/26/2022 1220      Latest Ref Rng & Units 10/05/2022    5:53 AM 09/25/2022    5:31 AM 09/15/2022    4:40 AM  BMP  Glucose 70 - 99 mg/dL 86  94  91   BUN 8 - 23 mg/dL 26  27  30    Creatinine 0.61 - 1.24 mg/dL 1.61  0.96  0.45   Sodium 135 - 145 mmol/L 137  139  139   Potassium 3.5 - 5.1 mmol/L 4.0  4.1  3.8   Chloride 98 - 111 mmol/L 105  107  107   CO2 22 - 32 mmol/L 25  23  26    Calcium 8.9 - 10.3 mg/dL 8.8  8.7  8.5    Micro Data   Results for orders placed or performed during the hospital encounter of 08/08/22  Resp panel by RT-PCR (RSV, Flu A&B, Covid) Anterior Nasal Swab     Status: None   Collection Time: 08/25/22  5:32 AM   Specimen: Anterior Nasal Swab  Result Value Ref Range Status   SARS Coronavirus 2 by RT PCR NEGATIVE NEGATIVE Final    Comment: (NOTE) SARS-CoV-2 target nucleic acids are NOT DETECTED.  The SARS-CoV-2 RNA is generally detectable in upper respiratory specimens during the acute phase of infection. The lowest concentration of SARS-CoV-2 viral copies this assay can detect is 138 copies/mL. A negative result does not preclude SARS-Cov-2 infection and should not be used as the sole basis for treatment or other patient management decisions. A negative result may occur with   improper specimen collection/handling, submission of specimen other than nasopharyngeal swab, presence of viral mutation(s) within the areas targeted by this assay, and inadequate number of viral copies(<138 copies/mL). A negative result must be combined with clinical observations, patient history, and epidemiological information. The expected result is Negative.  Fact Sheet for Patients:  BloggerCourse.com  Fact Sheet for Healthcare Providers:  SeriousBroker.it  This test is no t yet approved or cleared by the [place] FDA and  has been authorized for detection and/or diagnosis of SARS-CoV-2 by FDA under an Emergency Use Authorization (EUA). This EUA will remain  in effect (meaning this test can be used) for the duration of the COVID-19 declaration under Section 564(b)(1) of the Act, 21 U.S.C.section 360bbb-3(b)(1), unless the authorization is terminated  or revoked sooner.       Influenza A  by PCR NEGATIVE NEGATIVE Final   Influenza B by PCR NEGATIVE NEGATIVE Final    Comment: (NOTE) The Xpert Xpress SARS-CoV-2/FLU/RSV plus assay is intended as an aid in the diagnosis of influenza from Nasopharyngeal swab specimens and should not be used as a sole basis for treatment. Nasal washings and aspirates are unacceptable for Xpert Xpress SARS-CoV-2/FLU/RSV testing.  Fact Sheet for Patients: BloggerCourse.com  Fact Sheet for Healthcare Providers: SeriousBroker.it  This test is not yet approved or cleared by the [place] FDA and has been authorized for detection and/or diagnosis of SARS-CoV-2 by FDA under an Emergency Use Authorization (EUA). This EUA will remain in effect (meaning this test can be used) for the duration of the COVID-19 declaration under Section 564(b)(1) of the Act, 21 U.S.C. section 360bbb-3(b)(1), unless the authorization is terminated or revoked.      Resp Syncytial Virus by PCR NEGATIVE NEGATIVE Final    Comment: (NOTE) Fact Sheet for Patients: BloggerCourse.com  Fact Sheet for Healthcare Providers: SeriousBroker.it  This test is not yet approved or cleared by the [place] FDA and has been authorized for detection and/or diagnosis of SARS-CoV-2 by FDA under an Emergency Use Authorization (EUA). This EUA will remain in effect (meaning this test can be used) for the duration of the COVID-19 declaration under Section 564(b)(1) of the Act, 21 U.S.C. section 360bbb-3(b)(1), unless the authorization is terminated or revoked.  Performed at [facility], [address] [place], [place] [address]   SARS Coronavirus 2 by RT PCR (hospital order, performed in [facility] hospital lab) *cepheid single result test* Anterior Nasal Swab     Status: None   Collection Time: 09/06/22  7:35 AM   Specimen: Anterior Nasal Swab  Result Value Ref Range Status   SARS Coronavirus 2 by RT PCR NEGATIVE NEGATIVE Final    Comment: (NOTE) SARS-CoV-2 target nucleic acids are NOT DETECTED.  The SARS-CoV-2 RNA is generally detectable in upper and lower respiratory specimens during the acute phase of infection. The lowest concentration of SARS-CoV-2 viral copies this assay can detect is 250 copies / mL. A negative result does not preclude SARS-CoV-2 infection and should not be used as the sole basis for treatment or other patient management decisions.  A negative result may occur with improper specimen collection / handling, submission of specimen other than nasopharyngeal swab, presence of viral mutation(s) within the areas targeted by this assay, and inadequate number of viral copies (<250 copies / mL). A negative result must be combined with clinical observations, patient history, and epidemiological information.  Fact Sheet for Patients:    RoadLapTop.co.za  Fact Sheet for Healthcare Providers: http://kim-miller.com/  This test is not yet approved or  cleared by the [place] FDA and has been authorized for detection and/or diagnosis of SARS-CoV-2 by FDA under an Emergency Use Authorization (EUA).  This EUA will remain in effect (meaning this test can be used) for the duration of the COVID-19 declaration under Section 564(b)(1) of the Act, 21 U.S.C. section 360bbb-3(b)(1), unless the authorization is terminated or revoked sooner.  Performed at [facility], [address] [place], [place] [address]   Blood Culture (routine x 2)     Status: None   Collection Time: 09/06/22  7:38 AM   Specimen: BLOOD  Result Value Ref Range Status   Specimen Description BLOOD LEFT ANTECUBITAL  Final   Special Requests   Final    BOTTLES DRAWN AEROBIC AND ANAEROBIC Blood Culture results may not be optimal  due to an inadequate volume of blood received in culture bottles   Culture   Final    NO GROWTH 5 DAYS Performed at [facility], [address] [place], [place] [address]    Report Status 09/11/2022 FINAL  Final  Blood Culture (routine x 2)     Status: None   Collection Time: 09/06/22  9:18 AM   Specimen: BLOOD  Result Value Ref Range Status   Specimen Description BLOOD RIGHT [facility]  Final   Special Requests   Final    BOTTLES DRAWN AEROBIC AND ANAEROBIC Blood Culture adequate volume   Culture   Final    NO GROWTH 5 DAYS Performed at [facility], [address], [place], [place] [address]    Report Status 09/11/2022 FINAL  Final  MRSA Next Gen by PCR, Nasal     Status: None   Collection Time: 09/07/22  6:00 AM   Specimen: Nasal Mucosa; Nasal Swab  Result Value Ref Range Status   MRSA by PCR Next Gen NOT DETECTED NOT DETECTED Final    Comment: (NOTE) The GeneXpert MRSA Assay (FDA approved for NASAL specimens only), is one component of a  comprehensive MRSA colonization surveillance program. It is not intended to diagnose MRSA infection nor to guide or monitor treatment for MRSA infections. Test performance is not FDA approved in patients less than 23 years old. Performed at [facility], [address] [place], [place] [address]   Respiratory (~20 pathogens) panel by PCR     Status: Abnormal   Collection Time: 09/28/22 10:40 PM   Specimen: Nasopharyngeal Swab; Respiratory  Result Value Ref Range Status   Adenovirus NOT DETECTED NOT DETECTED Final   Coronavirus 229E NOT DETECTED NOT DETECTED Final    Comment: (NOTE) The Coronavirus on the Respiratory Panel, DOES NOT test for the novel  Coronavirus (2019 nCoV)    Coronavirus HKU1 NOT DETECTED NOT DETECTED Final   Coronavirus NL63 NOT DETECTED NOT DETECTED Final   Coronavirus OC43 NOT DETECTED NOT DETECTED Final   Metapneumovirus NOT DETECTED NOT DETECTED Final   Rhinovirus / Enterovirus NOT DETECTED NOT DETECTED Final   Influenza A NOT DETECTED NOT DETECTED Final   Influenza B NOT DETECTED NOT DETECTED Final   Parainfluenza Virus 1 NOT DETECTED NOT DETECTED Final   Parainfluenza Virus 2 NOT DETECTED NOT DETECTED Final   Parainfluenza Virus 3 DETECTED (A) NOT DETECTED Final   Parainfluenza Virus 4 NOT DETECTED NOT DETECTED Final   Respiratory Syncytial Virus NOT DETECTED NOT DETECTED Final   Bordetella pertussis NOT DETECTED NOT DETECTED Final   Bordetella Parapertussis NOT DETECTED NOT DETECTED Final   Chlamydophila pneumoniae NOT DETECTED NOT DETECTED Final   Mycoplasma pneumoniae NOT DETECTED NOT DETECTED Final    Comment: Performed at [facility], [address] [place], [place] [address]     Imaging  CT head: No acute intracranial abnormality CXR: No acute cardiothoracic abnormality.  Scheduled Meds:  vitamin B-12  1,000 mcg Oral Daily   divalproex  500 mg Oral Q12H   docusate sodium  100 mg Oral BID   donepezil  5 mg Oral Daily    enoxaparin (LOVENOX) injection  40 mg Subcutaneous Q24H   feeding supplement  237 mL Oral BID BM   pravastatin  40 mg Oral QHS   QUEtiapine  100 mg Oral TID   rOPINIRole  0.5 mg Oral QHS   tamsulosin  0.4 mg Oral QPC supper   traZODone  50 mg Oral QHS   Continuous Infusions:  Principal Problem:   Dementia with behavioral disturbance (HCC) Active Problems:   Mixed hyperlipidemia   History of TIA (transient ischemic attack)   Anxiety and depression   BPH (benign prostatic hyperplasia)   OAB (overactive bladder)   Altered mental status   Hypothermia   Restless leg   Vitamin B12 deficiency   Parainfluenza infection   LOS: 29 days

## 2022-10-06 DIAGNOSIS — F03918 Unspecified dementia, unspecified severity, with other behavioral disturbance: Secondary | ICD-10-CM | POA: Diagnosis not present

## 2022-10-06 NOTE — Progress Notes (Signed)
PROGRESS NOTE  RICHTER ROZEK ZOX:[id number] DOB: July 01, 1940 DOA: 08/08/2022 PCP: Pcp, No  Brief History   [name] is 82 year old male is a 82 year old male with history of dementia, behavioral disturbance, hyperlipidemia, BPH, insomnia, depression, anxiety, who presents to emergency department from [facility] rehab dementia department on 08/08/2022 for chief concerns of aggression with aggressive outburst towards staff. Presented to [name] ED on 2/24 due to agitation.  Was in the ED until 3/1 as he required SNF placement.  Sent to [facility] and [name].  On 3/7 returned back to ER for confusion, combative with staff.  Was IVC in the ED.Behavioral health was consulted.  TOC was consulted for placement to SNF again. however, was declined by facilities due to need for as needed Haldol.   On 4/5 EDP requesting admission for hypotension, hypothermia and change in mentation.  Symptoms resolved by 4/6. Workup was unremarkable for any significant infectious etiology. Patient continues to be seen by psychiatry to help manage his agitation. During the hospitalization had parainfluenza viral infection on 4/27 treated conservatively. As of 5/2 has not required any as needed Haldol dose. Behavior appears to be well-controlled.  Not agitated.  Not combative.  Patient is pending determination of a guardian for the patient and placement.  A & P  Agitation. Dementia with behavioral disturbances. Appreciate psychiatry consultation. Patient is currently on Seroquel, Depakote, Aricept. Trazodone for insomnia also ordered.  Switch from as needed to scheduled. Currently has a sitter primarily for safety and redirection. Not a candidate for inpatient psych admission. No evidence of infectious etiology right now. If has recurrence of agitation would recommend to increase Seroquel dose at nighttime to 200 mg.   Recent parainfluenza infection. Treated conservatively.  Diagnosed on 4/27.    Hypotension, hypothermia. Resolved Reason for admission. Workup is unremarkable. Vitals are improved significantly.   Bilateral lower extremity edema. Improved somewhat after receiving lasix 40 mg on 10/05/2022.    Restless leg. Continue Requip at nighttime.   Anxiety and depression. Appreciate psychiatry assistance. Continue current regimen.   Vitamin B12 deficiency. On oral supplements.   BPH. Continuing Flomax.  I have seen and examined this patient myself. I have spent 34 minutes in his evaluation and care.  DVT prophylaxis: Lovenox Code Status: Full Code Family Communication: None available Disposition Plan: Placement being sought by TOC.    Annalysia Willenbring, DO [facility] Direct contact: see [email or web address]  7PM-7AM contact night coverage as above 5/0/10/2022, 4:29 PM  LOS: 29 days   Consultants  Psychiatry  Procedures  None  Antibiotics   Anti-infectives (From admission, onward)    None        Subjective  The patient is sitting up on the edge of his bed. No new complaints.  Objective   Vitals:  Vitals:   10/06/22 0303 10/06/22 0853  BP: (!) 100/58 108/65  Pulse: 80 65  Resp: 18 18  Temp: 97.7 F (36.5 C) 98 F (36.7 C)  SpO2: 98% 100%     Exam:  Constitutional:  The patient is awake, alert, and oriented x 3. No acute distress. Respiratory:  No increased work of breathing. No wheezes, rales, or rhonchi No tactile fremitus Cardiovascular:  Regular rate and rhythm No murmurs, ectopy, or gallups. No lateral PMI. No thrills. Abdomen:  Abdomen is soft, non-tender, non-distended No hernias, masses, or organomegaly Normoactive bowel sounds.  Musculoskeletal:  No cyanosis, clubbing, or edema Skin:  No rashes, lesions, ulcers palpation of skin: no induration  or nodules Neurologic:  CN 2-12 intact Sensation all 4 extremities intact   I have personally reviewed the following:   Today's Data   Vitals:   10/06/22 0303  10/06/22 0853  BP: (!) 100/58 108/65  Pulse: 80 65  Resp: 18 18  Temp: 97.7 F (36.5 C) 98 F (36.7 C)  SpO2: 98% 100%      Lab Data   CBC    Component Value Date/Time   WBC 3.9 (L) 10/05/2022 0553   RBC 3.71 (L) 10/05/2022 0553   HGB 11.4 (L) 10/05/2022 0553   HGB 13.3 07/26/2022 1220   HCT 35.1 (L) 10/05/2022 0553   HCT 39.9 07/26/2022 1220   PLT 266 10/05/2022 0553   PLT 232 07/26/2022 1220   MCV 94.6 10/05/2022 0553   MCV 94 07/26/2022 1220   MCH 30.7 10/05/2022 0553   MCHC 32.5 10/05/2022 0553   RDW 12.1 10/05/2022 0553   RDW 12.3 07/26/2022 1220   LYMPHSABS 1.0 09/25/2022 0531   LYMPHSABS 1.1 07/26/2022 1220   MONOABS 1.0 09/25/2022 0531   EOSABS 0.1 09/25/2022 0531   EOSABS 0.1 07/26/2022 1220   BASOSABS 0.0 09/25/2022 0531   BASOSABS 0.0 07/26/2022 1220       Latest Ref Rng & Units 10/05/2022    5:53 AM 09/25/2022    5:31 AM 09/15/2022    4:40 AM  BMP  Glucose 70 - 99 mg/dL 86  94  91   BUN 8 - 23 mg/dL 26  27  30    Creatinine 0.61 - 1.24 mg/dL 0.98  1.19  1.47   Sodium 135 - 145 mmol/L 137  139  139   Potassium 3.5 - 5.1 mmol/L 4.0  4.1  3.8   Chloride 98 - 111 mmol/L 105  107  107   CO2 22 - 32 mmol/L 25  23  26    Calcium 8.9 - 10.3 mg/dL 8.8  8.7  8.5    Micro Data   Results for orders placed or performed during the hospital encounter of 08/08/22  Resp panel by RT-PCR (RSV, Flu A&B, Covid) Anterior Nasal Swab     Status: None   Collection Time: 08/25/22  5:32 AM   Specimen: Anterior Nasal Swab  Result Value Ref Range Status   SARS Coronavirus 2 by RT PCR NEGATIVE NEGATIVE Final    Comment: (NOTE) SARS-CoV-2 target nucleic acids are NOT DETECTED.  The SARS-CoV-2 RNA is generally detectable in upper respiratory specimens during the acute phase of infection. The lowest concentration of SARS-CoV-2 viral copies this assay can detect is 138 copies/mL. A negative result does not preclude SARS-Cov-2 infection and should not be used as the sole  basis for treatment or other patient management decisions. A negative result may occur with  improper specimen collection/handling, submission of specimen other than nasopharyngeal swab, presence of viral mutation(s) within the areas targeted by this assay, and inadequate number of viral copies(<138 copies/mL). A negative result must be combined with clinical observations, patient history, and epidemiological information. The expected result is Negative.  Fact Sheet for Patients:  BloggerCourse.com  Fact Sheet for Healthcare Providers:  SeriousBroker.it  This test is no t yet approved or cleared by the [place] FDA and  has been authorized for detection and/or diagnosis of SARS-CoV-2 by FDA under an Emergency Use Authorization (EUA). This EUA will remain  in effect (meaning this test can be used) for the duration of the COVID-19 declaration under Section 564(b)(1) of the Act, 21 U.S.C.section  360bbb-3(b)(1), unless the authorization is terminated  or revoked sooner.       Influenza A by PCR NEGATIVE NEGATIVE Final   Influenza B by PCR NEGATIVE NEGATIVE Final    Comment: (NOTE) The Xpert Xpress SARS-CoV-2/FLU/RSV plus assay is intended as an aid in the diagnosis of influenza from Nasopharyngeal swab specimens and should not be used as a sole basis for treatment. Nasal washings and aspirates are unacceptable for Xpert Xpress SARS-CoV-2/FLU/RSV testing.  Fact Sheet for Patients: BloggerCourse.com  Fact Sheet for Healthcare Providers: SeriousBroker.it  This test is not yet approved or cleared by the [place] FDA and has been authorized for detection and/or diagnosis of SARS-CoV-2 by FDA under an Emergency Use Authorization (EUA). This EUA will remain in effect (meaning this test can be used) for the duration of the COVID-19 declaration under Section 564(b)(1) of the Act,  21 U.S.C. section 360bbb-3(b)(1), unless the authorization is terminated or revoked.     Resp Syncytial Virus by PCR NEGATIVE NEGATIVE Final    Comment: (NOTE) Fact Sheet for Patients: BloggerCourse.com  Fact Sheet for Healthcare Providers: SeriousBroker.it  This test is not yet approved or cleared by the [place] FDA and has been authorized for detection and/or diagnosis of SARS-CoV-2 by FDA under an Emergency Use Authorization (EUA). This EUA will remain in effect (meaning this test can be used) for the duration of the COVID-19 declaration under Section 564(b)(1) of the Act, 21 U.S.C. section 360bbb-3(b)(1), unless the authorization is terminated or revoked.  Performed at [facility], [address] [place], [place] [address]   SARS Coronavirus 2 by RT PCR (hospital order, performed in [facility] hospital lab) *cepheid single result test* Anterior Nasal Swab     Status: None   Collection Time: 09/06/22  7:35 AM   Specimen: Anterior Nasal Swab  Result Value Ref Range Status   SARS Coronavirus 2 by RT PCR NEGATIVE NEGATIVE Final    Comment: (NOTE) SARS-CoV-2 target nucleic acids are NOT DETECTED.  The SARS-CoV-2 RNA is generally detectable in upper and lower respiratory specimens during the acute phase of infection. The lowest concentration of SARS-CoV-2 viral copies this assay can detect is 250 copies / mL. A negative result does not preclude SARS-CoV-2 infection and should not be used as the sole basis for treatment or other patient management decisions.  A negative result may occur with improper specimen collection / handling, submission of specimen other than nasopharyngeal swab, presence of viral mutation(s) within the areas targeted by this assay, and inadequate number of viral copies (<250 copies / mL). A negative result must be combined with clinical observations, patient history, and epidemiological  information.  Fact Sheet for Patients:   RoadLapTop.co.za  Fact Sheet for Healthcare Providers: http://kim-miller.com/  This test is not yet approved or  cleared by the [place] FDA and has been authorized for detection and/or diagnosis of SARS-CoV-2 by FDA under an Emergency Use Authorization (EUA).  This EUA will remain in effect (meaning this test can be used) for the duration of the COVID-19 declaration under Section 564(b)(1) of the Act, 21 U.S.C. section 360bbb-3(b)(1), unless the authorization is terminated or revoked sooner.  Performed at [facility], [address] [place], [place] [address]   Blood Culture (routine x 2)     Status: None   Collection Time: 09/06/22  7:38 AM   Specimen: BLOOD  Result Value Ref Range Status   Specimen Description BLOOD LEFT ANTECUBITAL  Final   Special Requests  Final    BOTTLES DRAWN AEROBIC AND ANAEROBIC Blood Culture results may not be optimal due to an inadequate volume of blood received in culture bottles   Culture   Final    NO GROWTH 5 DAYS Performed at [facility], [address] [place], [place] [address]    Report Status 09/11/2022 FINAL  Final  Blood Culture (routine x 2)     Status: None   Collection Time: 09/06/22  9:18 AM   Specimen: BLOOD  Result Value Ref Range Status   Specimen Description BLOOD RIGHT [facility]  Final   Special Requests   Final    BOTTLES DRAWN AEROBIC AND ANAEROBIC Blood Culture adequate volume   Culture   Final    NO GROWTH 5 DAYS Performed at [facility] [address], [place], [place] [address]    Report Status 09/11/2022 FINAL  Final  MRSA Next Gen by PCR, Nasal     Status: None   Collection Time: 09/07/22  6:00 AM   Specimen: Nasal Mucosa; Nasal Swab  Result Value Ref Range Status   MRSA by PCR Next Gen NOT DETECTED NOT DETECTED Final    Comment: (NOTE) The GeneXpert MRSA Assay (FDA approved for NASAL  specimens only), is one component of a comprehensive MRSA colonization surveillance program. It is not intended to diagnose MRSA infection nor to guide or monitor treatment for MRSA infections. Test performance is not FDA approved in patients less than 23 years old. Performed at [facility], [address] [place], [place] [address]   Respiratory (~20 pathogens) panel by PCR     Status: Abnormal   Collection Time: 09/28/22 10:40 PM   Specimen: Nasopharyngeal Swab; Respiratory  Result Value Ref Range Status   Adenovirus NOT DETECTED NOT DETECTED Final   Coronavirus 229E NOT DETECTED NOT DETECTED Final    Comment: (NOTE) The Coronavirus on the Respiratory Panel, DOES NOT test for the novel  Coronavirus (2019 nCoV)    Coronavirus HKU1 NOT DETECTED NOT DETECTED Final   Coronavirus NL63 NOT DETECTED NOT DETECTED Final   Coronavirus OC43 NOT DETECTED NOT DETECTED Final   Metapneumovirus NOT DETECTED NOT DETECTED Final   Rhinovirus / Enterovirus NOT DETECTED NOT DETECTED Final   Influenza A NOT DETECTED NOT DETECTED Final   Influenza B NOT DETECTED NOT DETECTED Final   Parainfluenza Virus 1 NOT DETECTED NOT DETECTED Final   Parainfluenza Virus 2 NOT DETECTED NOT DETECTED Final   Parainfluenza Virus 3 DETECTED (A) NOT DETECTED Final   Parainfluenza Virus 4 NOT DETECTED NOT DETECTED Final   Respiratory Syncytial Virus NOT DETECTED NOT DETECTED Final   Bordetella pertussis NOT DETECTED NOT DETECTED Final   Bordetella Parapertussis NOT DETECTED NOT DETECTED Final   Chlamydophila pneumoniae NOT DETECTED NOT DETECTED Final   Mycoplasma pneumoniae NOT DETECTED NOT DETECTED Final    Comment: Performed at [facility], [address], [place], [place] [address]     Imaging  CT head: No acute intracranial abnormality CXR: No acute cardiothoracic abnormality.  Scheduled Meds:  vitamin B-12  1,000 mcg Oral Daily   divalproex  500 mg Oral Q12H   docusate sodium  100 mg Oral  BID   donepezil  5 mg Oral Daily   enoxaparin (LOVENOX) injection  40 mg Subcutaneous Q24H   feeding supplement  237 mL Oral BID BM   pravastatin  40 mg Oral QHS   QUEtiapine  100 mg Oral TID   rOPINIRole  0.5  mg Oral QHS   tamsulosin  0.4 mg Oral QPC supper   traZODone  50 mg Oral QHS   Continuous Infusions:  Principal Problem:   Dementia with behavioral disturbance (HCC) Active Problems:   Mixed hyperlipidemia   History of TIA (transient ischemic attack)   Anxiety and depression   BPH (benign prostatic hyperplasia)   OAB (overactive bladder)   Altered mental status   Hypothermia   Restless leg   Vitamin B12 deficiency   Parainfluenza infection   LOS: 29 days

## 2022-10-07 DIAGNOSIS — F03918 Unspecified dementia, unspecified severity, with other behavioral disturbance: Secondary | ICD-10-CM | POA: Diagnosis not present

## 2022-10-07 MED ORDER — BISACODYL 10 MG RE SUPP
10.0000 mg | Freq: Once | RECTAL | Status: DC
  Filled 2022-10-07: qty 1

## 2022-10-07 MED ORDER — BISACODYL 10 MG RE SUPP: 10.0000 mg | Freq: Every day | RECTAL | Status: DC | PRN

## 2022-10-07 MED ORDER — POLYETHYLENE GLYCOL 3350 17 G PO PACK
17.0000 g | PACK | Freq: Two times a day (BID) | ORAL | Status: DC
  Administered 2022-10-07 – 2022-10-23 (×23): 17 g via ORAL
  Filled 2022-10-07 (×25): qty 1

## 2022-10-07 NOTE — Progress Notes (Signed)
[facility] Progress Note  Patient: Terry Sloan    JYN:[id number]  DOA: 08/08/2022     Date of Service: the patient was seen and examined on 10/07/2022  Chief Complaint  Patient presents with   Psychiatric Evaluation   Brief hospital course: [name] is 82 year old male is a 82 year old male with history of dementia, behavioral disturbance, hyperlipidemia, BPH, insomnia, depression, anxiety, who presents to emergency department from [facility] rehab dementia department on 08/08/2022 for chief concerns of aggression with aggressive outburst towards staff. Presented to [facility], ED on 2/24 due to agitation.  Was in the ED until 3/1 as he required SNF placement.  Sent to [facility] and [name].  On 3/7 returned back to ER for confusion, combative with staff.  Was IVC in the ED.Behavioral health was consulted.  TOC was consulted for placement to SNF again. however, was declined by facilities due to need for as needed Haldol.   On 4/5 EDP requesting admission for hypotension, hypothermia and change in mentation.  Symptoms resolved by 4/6. Workup was unremarkable for any significant infectious etiology. Patient continues to be seen by psychiatry to help manage his agitation. During the hospitalization had parainfluenza viral infection on 4/27 treated conservatively. As of 5/2 has not required any as needed Haldol dose. Behavior appears to be well-controlled.  Not agitated.  Not combative.   Patient is pending determination of a guardian for the patient and placement    Assessment and Plan: Agitation, Dementia with behavioral disturbances. Appreciate psychiatry consultation. Patient is currently on Seroquel, Depakote, Aricept. Trazodone for insomnia also ordered.  Switch from as needed to scheduled. Currently has a sitter primarily for safety and redirection. Not a candidate for inpatient psych admission. No evidence of infectious etiology right now. If has recurrence  of agitation would recommend to increase Seroquel dose at nighttime to 200 mg.   # Recent parainfluenza infection. Treated conservatively.  Diagnosed on 4/27.   # Hypotension, hypothermia. Resolved Reason for admission. Workup is unremarkable. Vitals are improved significantly.   # Bilateral lower extremity edema. Improved somewhat after receiving lasix 40 mg on 10/05/2022.    # Restless leg. Continue Requip at nighttime.   # Anxiety and depression. Appreciate psychiatry assistance. Continue current regimen.   # Vitamin B12 deficiency. On oral supplements.   # BPH. Continuing Flomax.  Body mass index is 24.74 kg/m.  Interventions:       Diet: Regular diet DVT Prophylaxis: Subcutaneous Lovenox   Advance goals of care discussion: Full code  Family Communication: family was not present at bedside, at the time of interview.  The pt provided permission to discuss medical plan with the family. Opportunity was given to ask question and all questions were answered satisfactorily.   Disposition:  Pt is from  [facility] facility in dementia unit, admitted with AMS, behavioral issue. still has behavioral issue, pending determination of guardian and placement, which precludes a safe discharge. Discharge to dementia unit, when bed will be available, awaiting for guardianship.  TOC is following for placement  Subjective: No significant events overnight, patient was walking around well without any difficulties.  Patient is AO x 1  Physical Exam: General: NAD, lying comfortably Appear in no distress, affect appropriate Eyes: PERRLA ENT: Oral Mucosa Clear, moist  Neck: no JVD,  Cardiovascular: S1 and S2 Present, no Murmur,  Respiratory: good respiratory effort, Bilateral Air entry equal and Decreased, no Crackles, no wheezes Abdomen: Bowel Sound present, Soft and no tenderness,  Skin: no rashes Extremities: no Pedal edema, no calf tenderness Neurologic: without any new  focal findings Gait not checked due to patient safety concerns  Vitals:   10/06/22 0303 10/06/22 0853 10/06/22 1913 10/07/22 0736  BP: (!) 100/58 108/65 112/66 (!) 127/101  Pulse: 80 65 73 72  Resp: 18 18 18 18   Temp: 97.7 F (36.5 C) 98 F (36.7 C) 98 F (36.7 C) 97.9 F (36.6 C)  TempSrc:      SpO2: 98% 100% 100% 100%  Weight:      Height:        Intake/Output Summary (Last 24 hours) at 10/07/2022 1628 Last data filed at 10/07/2022 0600 Gross per 24 hour  Intake 720 ml  Output --  Net 720 ml   Filed Weights   08/08/22 1205 08/14/22 0038 09/07/22 0057  Weight: 81 kg 78.2 kg 78.2 kg    Data Reviewed: I have personally reviewed and interpreted daily labs, tele strips, imagings as discussed above. I reviewed all nursing notes, pharmacy notes, vitals, pertinent old records I have discussed plan of care as described above with RN and patient/family.  CBC: Recent Labs  Lab 10/05/22 0553  WBC 3.9*  HGB 11.4*  HCT 35.1*  MCV 94.6  PLT 266   Basic Metabolic Panel: Recent Labs  Lab 10/05/22 0553  NA 137  K 4.0  CL 105  CO2 25  GLUCOSE 86  BUN 26*  CREATININE 1.20  CALCIUM 8.8*  MG 2.2    Studies: No results found.  Scheduled Meds:  bisacodyl  10 mg Rectal Once   vitamin B-12  1,000 mcg Oral Daily   divalproex  500 mg Oral Q12H   donepezil  5 mg Oral Daily   enoxaparin (LOVENOX) injection  40 mg Subcutaneous Q24H   feeding supplement  237 mL Oral BID BM   polyethylene glycol  17 g Oral BID   pravastatin  40 mg Oral QHS   QUEtiapine  100 mg Oral TID   rOPINIRole  0.5 mg Oral QHS   tamsulosin  0.4 mg Oral QPC supper   traZODone  50 mg Oral QHS   Continuous Infusions: PRN Meds: acetaminophen, bisacodyl, guaiFENesin-dextromethorphan  Time spent: 35 minutes  Author: Gillis Santa. MD [facility] 10/07/2022 4:28 PM  To reach On-call, see care teams to locate the attending and reach out to them via [email or web address]. If 7PM-7AM, please contact  night-coverage If you still have difficulty reaching the attending provider, please page the [facility] (Director on Call) for [facility] on amion for assistance.

## 2022-10-07 NOTE — Progress Notes (Signed)
Mobility Specialist - Progress Note   10/07/22 1445  Mobility  Activity Ambulated with assistance in hallway  Level of Assistance Standby assist, set-up cues, supervision of patient - no hands on  Assistive Device Front wheel walker  Distance Ambulated (ft) 100 ft  Activity Response Tolerated well  $Mobility charge 1 Mobility  Mobility Specialist Start Time (ACUTE ONLY) 1430  Mobility Specialist Stop Time (ACUTE ONLY) 1445  Mobility Specialist Time Calculation (min) (ACUTE ONLY) 15 min   Pt amb 100 ft in the hallway with supervision-- requiring MinA for RW management and max Vcs for hand placement on the RW. MS unable to redirect Pt to continue amb. Pt returned to room, left sitting EOB with needs within reach. Sitter present at bedside.   Zetta Bills Mobility Specialist 10/07/22 2:49 PM

## 2022-10-07 NOTE — TOC Progression Note (Signed)
Transition of Care [facility]) - Progression Note    Patient Details  Name: Terry Sloan MRN: [id number] Date of Birth: 1940-07-18  [facility] CM/SW Contact  Darleene Cleaver, [place] Phone Number: 10/07/2022, 6:29 PM  Clinical Narrative:     CSW still trying to find placement for patient.  Expected Discharge Plan:  (TBD) Barriers to Discharge: Continued Medical Work up  Expected Discharge Plan and Services                                               Social Determinants of Health (SDOH) Interventions SDOH Screenings   Food Insecurity: Patient Unable To Answer (09/07/2022)  Housing: Low Risk  (09/07/2022)  Transportation Needs: Patient Unable To Answer (09/07/2022)  Utilities: Not At Risk (07/23/2022)  Alcohol Screen: Low Risk  (07/23/2022)  Depression (PHQ2-9): Low Risk  (07/23/2022)  Recent Concern: Depression (PHQ2-9) - Medium Risk (06/17/2022)  Financial Resource Strain: Low Risk  (07/23/2022)  Physical Activity: Insufficiently Active (07/23/2022)  Social Connections: Socially Isolated (07/23/2022)  Stress: No Stress Concern Present (07/23/2022)  Tobacco Use: Medium Risk (09/11/2022)    Readmission Risk Interventions     No data to display

## 2022-10-08 DIAGNOSIS — F03918 Unspecified dementia, unspecified severity, with other behavioral disturbance: Secondary | ICD-10-CM | POA: Diagnosis not present

## 2022-10-08 NOTE — Progress Notes (Signed)
Mobility Specialist - Progress Note  10/08/22 1454  Mobility  Activity Ambulated with assistance in hallway  Level of Assistance Standby assist, set-up cues, supervision of patient - no hands on  Assistive Device Front wheel walker  Distance Ambulated (ft) 180 ft  Activity Response Tolerated well  $Mobility charge 1 Mobility  Mobility Specialist Start Time (ACUTE ONLY) 1440  Mobility Specialist Stop Time (ACUTE ONLY) 1450  Mobility Specialist Time Calculation (min) (ACUTE ONLY) 10 min   Pt amb in room upon entry, utilizing RA. Pt amb one lap around NS SBA, requiring min VCs for hand placement on RW. Pt returned to room, left sitting EOB with sitter present.   Zetta Bills Mobility Specialist 10/08/22 2:57 PM

## 2022-10-08 NOTE — Progress Notes (Signed)
[facility] Progress Note  Patient: Terry Sloan    WUJ:[id number]  DOA: 08/08/2022     Date of Service: the patient was seen and examined on 10/08/2022  Chief Complaint  Patient presents with   Psychiatric Evaluation   Brief hospital course: [name] is 82 year old male is a 82 year old male with history of dementia, behavioral disturbance, hyperlipidemia, BPH, insomnia, depression, anxiety, who presents to emergency department from [facility] rehab dementia department on 08/08/2022 for chief concerns of aggression with aggressive outburst towards staff. Presented to [name] ED on 2/24 due to agitation.  Was in the ED until 3/1 as he required SNF placement.  Sent to [facility] rehab and [name].  On 3/7 returned back to ER for confusion, combative with staff.  Was IVC in the ED.Behavioral health was consulted.  TOC was consulted for placement to SNF again. however, was declined by facilities due to need for as needed Haldol.   On 4/5 EDP requesting admission for hypotension, hypothermia and change in mentation.  Symptoms resolved by 4/6. Workup was unremarkable for any significant infectious etiology. Patient continues to be seen by psychiatry to help manage his agitation. During the hospitalization had parainfluenza viral infection on 4/27 treated conservatively. As of 5/2 has not required any as needed Haldol dose. Behavior appears to be well-controlled.  Not agitated.  Not combative.   Patient is pending determination of a guardian for the patient and placement    Assessment and Plan: Agitation, Dementia with behavioral disturbances. Appreciate psychiatry consultation. Patient is currently on Seroquel, Depakote, Aricept. Trazodone for insomnia also ordered.  Switch from as needed to scheduled. Currently has a sitter primarily for safety and redirection. Not a candidate for inpatient psych admission. No evidence of infectious etiology right now. If has recurrence  of agitation would recommend to increase Seroquel dose at nighttime to 200 mg.   # Recent parainfluenza infection. Treated conservatively.  Diagnosed on 4/27.   # Hypotension, hypothermia. Resolved Reason for admission. Workup is unremarkable. Vitals are improved significantly.   # Bilateral lower extremity edema. Improved somewhat after receiving lasix 40 mg on 10/05/2022.    # Restless leg. Continue Requip at nighttime.   # Anxiety and depression. Appreciate psychiatry assistance. Continue current regimen.   # Vitamin B12 deficiency. On oral supplements.   # BPH. Continuing Flomax.  Body mass index is 24.74 kg/m.  Interventions:       Diet: Regular diet DVT Prophylaxis: Subcutaneous Lovenox   Advance goals of care discussion: Full code  Family Communication: family was not present at bedside, at the time of interview.  The pt provided permission to discuss medical plan with the family. Opportunity was given to ask question and all questions were answered satisfactorily.   Disposition:  Pt is from  [facility] nursing facility in dementia unit, admitted with AMS, behavioral issue. still has behavioral issue, pending determination of guardian and placement, which precludes a safe discharge. Discharge to dementia unit, when bed will be available, awaiting for guardianship.  TOC is following for placement  Subjective: No significant events overnight, patient was sitting comfortably on the bed, denied any complaints.   Physical Exam: General: NAD, lying comfortably Appear in no distress, affect appropriate Eyes: PERRLA ENT: Oral Mucosa Clear, moist  Neck: no JVD,  Cardiovascular: S1 and S2 Present, no Murmur,  Respiratory: good respiratory effort, Bilateral Air entry equal and Decreased, no Crackles, no wheezes Abdomen: Bowel Sound present, Soft and no tenderness,  Skin: no  rashes Extremities: no Pedal edema, no calf tenderness Neurologic: without any new focal  findings Gait not checked due to patient safety concerns  Vitals:   10/08/22 0300 10/08/22 0811 10/08/22 1201 10/08/22 1500  BP: 116/60 104/61 108/64 97/76  Pulse: 79 77 79 85  Resp: 18 18 18 19   Temp: 97.7 F (36.5 C) 97.8 F (36.6 C) 97.7 F (36.5 C) 98.7 F (37.1 C)  TempSrc:  Oral Oral Oral  SpO2:  100% 99% 99%  Weight:      Height:        Intake/Output Summary (Last 24 hours) at 10/08/2022 1634 Last data filed at 10/08/2022 1300 Gross per 24 hour  Intake 240 ml  Output 350 ml  Net -110 ml   Filed Weights   08/08/22 1205 08/14/22 0038 09/07/22 0057  Weight: 81 kg 78.2 kg 78.2 kg    Data Reviewed: I have personally reviewed and interpreted daily labs, tele strips, imagings as discussed above. I reviewed all nursing notes, pharmacy notes, vitals, pertinent old records I have discussed plan of care as described above with RN and patient/family.  CBC: Recent Labs  Lab 10/05/22 0553  WBC 3.9*  HGB 11.4*  HCT 35.1*  MCV 94.6  PLT 266   Basic Metabolic Panel: Recent Labs  Lab 10/05/22 0553  NA 137  K 4.0  CL 105  CO2 25  GLUCOSE 86  BUN 26*  CREATININE 1.20  CALCIUM 8.8*  MG 2.2    Studies: No results found.  Scheduled Meds:  bisacodyl  10 mg Rectal Once   vitamin B-12  1,000 mcg Oral Daily   divalproex  500 mg Oral Q12H   donepezil  5 mg Oral Daily   enoxaparin (LOVENOX) injection  40 mg Subcutaneous Q24H   feeding supplement  237 mL Oral BID BM   polyethylene glycol  17 g Oral BID   pravastatin  40 mg Oral QHS   QUEtiapine  100 mg Oral TID   rOPINIRole  0.5 mg Oral QHS   tamsulosin  0.4 mg Oral QPC supper   traZODone  50 mg Oral QHS   Continuous Infusions: PRN Meds: acetaminophen, bisacodyl, guaiFENesin-dextromethorphan  Time spent: 35 minutes  Author: Gillis Santa. MD [facility] 10/08/2022 4:34 PM  To reach On-call, see care teams to locate the attending and reach out to them via [email or web address]. If 7PM-7AM, please contact  night-coverage If you still have difficulty reaching the attending provider, please page the [facility] (Director on Call) for [facility] on amion for assistance.

## 2022-10-09 DIAGNOSIS — F03918 Unspecified dementia, unspecified severity, with other behavioral disturbance: Secondary | ICD-10-CM | POA: Diagnosis not present

## 2022-10-09 NOTE — Progress Notes (Addendum)
[facility] Progress Note  Patient: Terry Sloan    ZOX:[id number]  DOA: 08/08/2022     Date of Service: the patient was seen and examined on 10/09/2022  Chief Complaint  Patient presents with   Psychiatric Evaluation   Brief hospital course: Mr. Lonas Calver is 82 year old male is a 82 year old male with history of dementia, behavioral disturbance, hyperlipidemia, BPH, insomnia, depression, anxiety, who presents to emergency department from [facility] rehab dementia department on 08/08/2022 for chief concerns of aggression with aggressive outburst towards staff. Presented to [name] ED on 2/24 due to agitation.  Was in the ED until 3/1 as he required SNF placement.  Sent to [facility] rehab and [name].  On 3/7 returned back to ER for confusion, combative with staff.  Was IVC in the ED.Behavioral health was consulted.  TOC was consulted for placement to SNF again. however, was declined by facilities due to need for as needed Haldol.   On 4/5 EDP requesting admission for hypotension, hypothermia and change in mentation.  Symptoms resolved by 4/6. Workup was unremarkable for any significant infectious etiology. Patient continues to be seen by psychiatry to help manage his agitation. During the hospitalization had parainfluenza viral infection on 4/27 treated conservatively. As of 5/2 has not required any as needed Haldol dose. Behavior appears to be well-controlled.  Not agitated.  Not combative.   Patient is pending determination of a guardian for the patient and placement    Assessment and Plan: Agitation, Dementia with behavioral disturbances. Appreciate psychiatry consultation. Patient is currently on Seroquel, Depakote, Aricept. Trazodone for insomnia also ordered.  Switch from as needed to scheduled. Currently has a sitter primarily for safety and redirection. Not a candidate for inpatient psych admission. No evidence of infectious etiology right now. If has recurrence  of agitation would recommend to increase Seroquel dose at nighttime to 200 mg.   # Recent parainfluenza infection. Treated conservatively.  Diagnosed on 4/27.   # Hypotension, hypothermia. Resolved Reason for admission. Workup is unremarkable. Vitals are improved significantly.   # Bilateral lower extremity edema. Improved somewhat after receiving lasix 40 mg on 10/05/2022.    # Restless leg. Continue Requip at nighttime.   # Anxiety and depression. Appreciate psychiatry assistance. Continue current regimen.   # Vitamin B12 deficiency. On oral supplements.   # BPH. Continuing Flomax.  Body mass index is 24.74 kg/m.  Interventions:       Diet: Regular diet DVT Prophylaxis: Subcutaneous Lovenox   Advance goals of care discussion: Full code  Family Communication: family was not present at bedside, at the time of interview.  The pt provided permission to discuss medical plan with the family. Opportunity was given to ask question and all questions were answered satisfactorily.   Disposition:  Pt is from  [facility] nursing facility in dementia unit, admitted with AMS, behavioral issue. still has behavioral issue, pending determination of guardian and placement, which precludes a safe discharge. Discharge to dementia unit, when bed will be available, awaiting for guardianship.  TOC is following for placement  Subjective: No significant events overnight, patient was sitting comfortably on the recliner, RN was the changing is dressed.  Patient seems to be comfortable and did not offer any complaints. As per RN patient had less sleep last night, slept about 2 hours and then he slept another 2 hours in the morning.  Physical Exam: General: NAD, lying comfortably Appear in no distress, affect appropriate Eyes: PERRLA ENT: Oral Mucosa Clear, moist  Neck: no JVD,  Cardiovascular: S1 and S2 Present, no Murmur,  Respiratory: good respiratory effort, Bilateral Air entry equal  and Decreased, no Crackles, no wheezes Abdomen: Bowel Sound present, Soft and no tenderness,  Skin: no rashes Extremities: no Pedal edema, no calf tenderness Neurologic: without any new focal findings Gait not checked due to patient safety concerns  Vitals:   10/08/22 1500 10/08/22 1900 10/09/22 0450 10/09/22 0859  BP: 97/76 119/74 103/68 124/66  Pulse: 85 85 79 73  Resp: 19 19 18 16   Temp: 98.7 F (37.1 C) 98.5 F (36.9 C) (!) 97.5 F (36.4 C) (!) 97.5 F (36.4 C)  TempSrc: Oral Oral    SpO2: 99% 99% 100% 100%  Weight:      Height:        Intake/Output Summary (Last 24 hours) at 10/09/2022 1528 Last data filed at 10/09/2022 0500 Gross per 24 hour  Intake 360 ml  Output 1 ml  Net 359 ml   Filed Weights   08/08/22 1205 08/14/22 0038 09/07/22 0057  Weight: 81 kg 78.2 kg 78.2 kg    Data Reviewed: I have personally reviewed and interpreted daily labs, tele strips, imagings as discussed above. I reviewed all nursing notes, pharmacy notes, vitals, pertinent old records I have discussed plan of care as described above with RN and patient/family.  CBC: Recent Labs  Lab 10/05/22 0553  WBC 3.9*  HGB 11.4*  HCT 35.1*  MCV 94.6  PLT 266   Basic Metabolic Panel: Recent Labs  Lab 10/05/22 0553  NA 137  K 4.0  CL 105  CO2 25  GLUCOSE 86  BUN 26*  CREATININE 1.20  CALCIUM 8.8*  MG 2.2    Studies: No results found.  Scheduled Meds:  bisacodyl  10 mg Rectal Once   vitamin B-12  1,000 mcg Oral Daily   divalproex  500 mg Oral Q12H   donepezil  5 mg Oral Daily   enoxaparin (LOVENOX) injection  40 mg Subcutaneous Q24H   feeding supplement  237 mL Oral BID BM   polyethylene glycol  17 g Oral BID   pravastatin  40 mg Oral QHS   QUEtiapine  100 mg Oral TID   rOPINIRole  0.5 mg Oral QHS   tamsulosin  0.4 mg Oral QPC supper   traZODone  50 mg Oral QHS   Continuous Infusions: PRN Meds: acetaminophen, bisacodyl, guaiFENesin-dextromethorphan  Time spent: 35  minutes  Author: Gillis Santa. MD [facility] 10/09/2022 3:28 PM  To reach On-call, see care teams to locate the attending and reach out to them via [email or web address]. If 7PM-7AM, please contact night-coverage If you still have difficulty reaching the attending provider, please page the [facility] (Director on Call) for [facility] on amion for assistance.

## 2022-10-10 DIAGNOSIS — F03918 Unspecified dementia, unspecified severity, with other behavioral disturbance: Secondary | ICD-10-CM | POA: Diagnosis not present

## 2022-10-10 MED ORDER — MELATONIN 5 MG PO TABS
5.0000 mg | ORAL_TABLET | Freq: Every day | ORAL | Status: DC
  Administered 2022-10-10 – 2022-11-08 (×30): 5 mg via ORAL
  Filled 2022-10-10 (×30): qty 1

## 2022-10-10 MED ORDER — TRAZODONE HCL 50 MG PO TABS
50.0000 mg | ORAL_TABLET | Freq: Once | ORAL | Status: AC
  Administered 2022-10-11: 50 mg via ORAL
  Filled 2022-10-10: qty 1

## 2022-10-10 NOTE — Progress Notes (Signed)
[facility] Progress Note  Patient: Terry Sloan    ZOX:[id number]  DOA: 08/08/2022     Date of Service: the patient was seen and examined on 10/10/2022  Chief Complaint  Patient presents with   Psychiatric Evaluation   Brief hospital course: Terry Sloan is 82 year old male is a 82 year old male with history of dementia, behavioral disturbance, hyperlipidemia, BPH, insomnia, depression, anxiety, who presents to emergency department from [facility] rehab dementia department on 08/08/2022 for chief concerns of aggression with aggressive outburst towards staff. Presented to [name] ED on 2/24 due to agitation.  Was in the ED until 3/1 as he required SNF placement.  Sent to [facility] rehab and [name].  On 3/7 returned back to ER for confusion, combative with staff.  Was IVC in the ED.Behavioral health was consulted.  TOC was consulted for placement to SNF again. however, was declined by facilities due to need for as needed Haldol.   On 4/5 EDP requesting admission for hypotension, hypothermia and change in mentation.  Symptoms resolved by 4/6. Workup was unremarkable for any significant infectious etiology. Patient continues to be seen by psychiatry to help manage his agitation. During the hospitalization had parainfluenza viral infection on 4/27 treated conservatively. As of 5/2 has not required any as needed Haldol dose. Behavior appears to be well-controlled.  Not agitated.  Not combative.   Patient is pending determination of a guardian for the patient and placement    Assessment and Plan: Agitation, Dementia with behavioral disturbances. Appreciate psychiatry consultation. Patient is currently on Seroquel, Depakote, Aricept. Trazodone for insomnia also ordered.  Switch from as needed to scheduled. Currently has a sitter primarily for safety and redirection. Not a candidate for inpatient psych admission. No evidence of infectious etiology right now. If has recurrence  of agitation would recommend to increase Seroquel dose at nighttime to 200 mg. 5/9 due to insomnia added melatonin 5 mg p.o. nightly  # Recent parainfluenza infection. Treated conservatively.  Diagnosed on 4/27.   # Hypotension, hypothermia. Resolved Reason for admission. Workup is unremarkable. Vitals are improved significantly.   # Bilateral lower extremity edema. Improved somewhat after receiving lasix 40 mg on 10/05/2022.    # Restless leg. Continue Requip at nighttime.   # Anxiety and depression. Appreciate psychiatry assistance. Continue current regimen.   # Vitamin B12 deficiency. On oral supplements.   # BPH. Continuing Flomax.  Body mass index is 24.74 kg/m.  Interventions:       Diet: Regular diet DVT Prophylaxis: Subcutaneous Lovenox   Advance goals of care discussion: Full code  Family Communication: family was not present at bedside, at the time of interview.  The pt provided permission to discuss medical plan with the family. Opportunity was given to ask question and all questions were answered satisfactorily.   Disposition:  Pt is from  [facility] nursing facility in dementia unit, admitted with AMS, behavioral issue. still has behavioral issue, pending determination of guardian and placement, which precludes a safe discharge. Discharge to dementia unit, when bed will be available, awaiting for guardianship.  TOC is following for placement  Subjective: No significant events overnight, patient was sleepy in the room, as per caregiver patient did not sleep last night only about 3 hours and then he was sleepy in the morning.  Patient was walking around back-and-forth in the hallway, before falling asleep.   Physical Exam: General: NAD, lying comfortably Appear in no distress, affect appropriate Eyes: PERRLA ENT: Oral Mucosa Clear, moist  Neck: no JVD,  Cardiovascular: S1 and S2 Present, no Murmur,  Respiratory: good respiratory effort, Bilateral Air  entry equal and Decreased, no Crackles, no wheezes Abdomen: Bowel Sound present, Soft and no tenderness,  Skin: no rashes Extremities: no Pedal edema, no calf tenderness Neurologic: without any new focal findings Gait not checked due to patient safety concerns  Vitals:   10/09/22 0859 10/09/22 2036 10/10/22 0121 10/10/22 0143  BP: 124/66 (!) 99/59  122/74  Pulse: 73 87  90  Resp: 16 19  18   Temp: (!) 97.5 F (36.4 C) 98 F (36.7 C)  97.7 F (36.5 C)  TempSrc:      SpO2: 100% 100% 95% 95%  Weight:      Height:       No intake or output data in the 24 hours ending 10/10/22 1404  Filed Weights   08/08/22 1205 08/14/22 0038 09/07/22 0057  Weight: 81 kg 78.2 kg 78.2 kg    Data Reviewed: I have personally reviewed and interpreted daily labs, tele strips, imagings as discussed above. I reviewed all nursing notes, pharmacy notes, vitals, pertinent old records I have discussed plan of care as described above with RN and patient/family.  CBC: Recent Labs  Lab 10/05/22 0553  WBC 3.9*  HGB 11.4*  HCT 35.1*  MCV 94.6  PLT 266   Basic Metabolic Panel: Recent Labs  Lab 10/05/22 0553  NA 137  K 4.0  CL 105  CO2 25  GLUCOSE 86  BUN 26*  CREATININE 1.20  CALCIUM 8.8*  MG 2.2    Studies: No results found.  Scheduled Meds:  bisacodyl  10 mg Rectal Once   vitamin B-12  1,000 mcg Oral Daily   divalproex  500 mg Oral Q12H   donepezil  5 mg Oral Daily   enoxaparin (LOVENOX) injection  40 mg Subcutaneous Q24H   feeding supplement  237 mL Oral BID BM   melatonin  5 mg Oral QHS   polyethylene glycol  17 g Oral BID   pravastatin  40 mg Oral QHS   QUEtiapine  100 mg Oral TID   rOPINIRole  0.5 mg Oral QHS   tamsulosin  0.4 mg Oral QPC supper   traZODone  50 mg Oral QHS   Continuous Infusions: PRN Meds: acetaminophen, bisacodyl, guaiFENesin-dextromethorphan  Time spent: 35 minutes  Author: Gillis Santa. MD [facility] 10/10/2022 2:04 PM  To reach On-call, see  care teams to locate the attending and reach out to them via [email or web address]. If 7PM-7AM, please contact night-coverage If you still have difficulty reaching the attending provider, please page the [facility] (Director on Call) for [facility] on amion for assistance.

## 2022-10-10 NOTE — Progress Notes (Signed)
Mobility Specialist - Progress Note   10/10/22 1504  Mobility  Activity Ambulated with assistance in hallway  Level of Assistance Standby assist, set-up cues, supervision of patient - no hands on  Assistive Device Front wheel walker  Distance Ambulated (ft) 320 ft  Activity Response Tolerated well  $Mobility charge 1 Mobility  Mobility Specialist Start Time (ACUTE ONLY) 1436  Mobility Specialist Stop Time (ACUTE ONLY) 1455  Mobility Specialist Time Calculation (min) (ACUTE ONLY) 19 min   Zetta Bills Mobility Specialist 10/10/22 3:06 PM

## 2022-10-10 NOTE — Progress Notes (Signed)
   10/10/22 0143  What Happened  Was fall witnessed? Yes (assisted)  Who witnessed fall? Sitter  Patients activity before fall ambulating-assisted  Was patient injured? No  Provider Notification  Provider Name/Title Manuela Schwartz NP  Date Provider Notified 10/10/22  Time Provider Notified [phone]  Method of Notification  (SECURE TEXT)  Notification Reason Other (Comment) (assisted fall)  Provider response No new orders  Date of Provider Response 10/10/22  Time of Provider Response 0143  Follow Up  Family notified No - patient refusal (attempts unsuccessful)  Time family notified 0146  Additional tests No  Progress note created (see row info) Yes  Adult Fall Risk Assessment  Risk Factor Category (scoring not indicated) High fall risk per protocol (document High fall risk)  Patient Fall Risk Level High fall risk  Adult Fall Risk Interventions  Required Bundle Interventions *See Row Information* High fall risk - low, moderate, and high requirements implemented  Additional Interventions Use of appropriate toileting equipment (bedpan, BSC, etc.);Safety Sitter/Safety Rounder;Room near nurses station  Screening for Fall Injury Risk (To be completed on HIGH fall risk patients) - Assessing Need for Floor Mats  Risk For Fall Injury- Criteria for Floor Mats Confusion/dementia (+NuDESC, CIWA, TBI, etc.)  Will Implement Floor Mats Yes  Vitals  Temp 97.7 F (36.5 C)  BP 122/74  MAP (mmHg) 90  BP Method Automatic  Pulse Rate 90  Pulse Rate Source Monitor  Resp 18  Oxygen Therapy  SpO2 95 %  O2 Device Room Air  Pain Assessment  Pain Scale PAINAD  PAINAD (Pain Assessment in Advanced Dementia)  Breathing 0  Negative Vocalization 0  Facial Expression 0  Body Language 0  Consolability 0  PAINAD Score 0  Neurological  Level of Consciousness Alert  Orientation Level Oriented to person;Disoriented to situation;Disoriented to time;Disoriented to place  Cognition Follows commands;Poor  safety awareness;Poor judgement  Speech Clear  Glasgow Coma Scale  Eye Opening 4  Best Verbal Response (NON-intubated) 4  Best Motor Response 6  Glasgow Coma Scale Score 14   Pt was assisted to the floor by sitter, per sitter. Pt observed to be on the floor. Pt assisted back to recliner no new complaints, no evidence of physical injuries. V/s WNL. Provider made aware

## 2022-10-10 NOTE — TOC Progression Note (Signed)
Transition of Care [facility]) - Progression Note    Patient Details  Name: Terry Sloan MRN: [id number] Date of Birth: 1941/05/05  [facility] CM/SW Contact  Darleene Cleaver, [place] Phone Number: 10/10/2022, 6:47 PM  Clinical Narrative:     CSW asked patient's daughter [name], if [facility] memory care ALF can assess patient.  Per patient's daughter she said that is fine.  CSW informed [place] nurse from [facility] that patient's family is agreeable to have patient assessed.  Patient being reviewed by [facility] memory care alf and [facility] clinical nurse to see if they have a facility for patient.   Expected Discharge Plan:  (TBD) Barriers to Discharge: Continued Medical Work up  Expected Discharge Plan and Services  Memory Care ALF                                             Social Determinants of Health (SDOH) Interventions SDOH Screenings   Food Insecurity: Patient Unable To Answer (09/07/2022)  Housing: Low Risk  (09/07/2022)  Transportation Needs: Patient Unable To Answer (09/07/2022)  Utilities: Not At Risk (07/23/2022)  Alcohol Screen: Low Risk  (07/23/2022)  Depression (PHQ2-9): Low Risk  (07/23/2022)  Recent Concern: Depression (PHQ2-9) - Medium Risk (06/17/2022)  Financial Resource Strain: Low Risk  (07/23/2022)  Physical Activity: Insufficiently Active (07/23/2022)  Social Connections: Socially Isolated (07/23/2022)  Stress: No Stress Concern Present (07/23/2022)  Tobacco Use: Medium Risk (09/11/2022)    Readmission Risk Interventions     No data to display

## 2022-10-11 MED ORDER — DIPHENHYDRAMINE HCL 25 MG PO CAPS
25.0000 mg | ORAL_CAPSULE | Freq: Four times a day (QID) | ORAL | Status: DC | PRN
  Administered 2022-10-11 – 2022-10-30 (×10): 25 mg via ORAL
  Filled 2022-10-11 (×10): qty 1

## 2022-10-11 MED ORDER — LORATADINE 10 MG PO TABS
10.0000 mg | ORAL_TABLET | Freq: Every day | ORAL | Status: AC
  Administered 2022-10-11 – 2022-10-15 (×4): 10 mg via ORAL
  Filled 2022-10-11 (×5): qty 1

## 2022-10-11 NOTE — TOC Progression Note (Signed)
Transition of Care [facility]) - Progression Note    Patient Details  Name: Terry Sloan MRN: [id number] Date of Birth: 1940-09-27  Transition of [facility]) CM/SW Contact  Darleene Cleaver, [place] Phone Number: 10/11/2022, 5:26 PM  Clinical Narrative:     CSW spoke to [place] at [facility], she spoke to patient's family, and unfortunately family can not afford to pay for [facility].  TOC to contact other memory care facilities to see if they have any Medicaid beds available.   Expected Discharge Plan:  (TBD) Barriers to Discharge: Continued Medical Work up  Expected Discharge Plan and Services                                               Social Determinants of Health (SDOH) Interventions SDOH Screenings   Food Insecurity: Patient Unable To Answer (09/07/2022)  Housing: Low Risk  (09/07/2022)  Transportation Needs: Patient Unable To Answer (09/07/2022)  Utilities: Not At Risk (07/23/2022)  Alcohol Screen: Low Risk  (07/23/2022)  Depression (PHQ2-9): Low Risk  (07/23/2022)  Recent Concern: Depression (PHQ2-9) - Medium Risk (06/17/2022)  Financial Resource Strain: Low Risk  (07/23/2022)  Physical Activity: Insufficiently Active (07/23/2022)  Social Connections: Socially Isolated (07/23/2022)  Stress: No Stress Concern Present (07/23/2022)  Tobacco Use: Medium Risk (09/11/2022)    Readmission Risk Interventions     No data to display

## 2022-10-11 NOTE — Progress Notes (Signed)
[facility] Progress Note  Patient: Terry Sloan    WGN:[id number]  DOA: 08/08/2022     Date of Service: the patient was seen and examined on 10/11/2022  Chief Complaint  Patient presents with   Psychiatric Evaluation   Brief hospital course: Terry Sloan is 82 year old male is a 82 year old male with history of dementia, behavioral disturbance, hyperlipidemia, BPH, insomnia, depression, anxiety, who presents to emergency department from [facility] rehab dementia department on 08/08/2022 for chief concerns of aggression with aggressive outburst towards staff. Presented to [name] ED on 2/24 due to agitation.  Was in the ED until 3/1 as he required SNF placement.  Sent to [place] rehab and [name].  On 3/7 returned back to ER for confusion, combative with staff.  Was IVC in the ED.Behavioral health was consulted.  TOC was consulted for placement to SNF again. however, was declined by facilities due to need for as needed Haldol.   On 4/5 EDP requesting admission for hypotension, hypothermia and change in mentation.  Symptoms resolved by 4/6. Workup was unremarkable for any significant infectious etiology. Patient continues to be seen by psychiatry to help manage his agitation. During the hospitalization had parainfluenza viral infection on 4/27 treated conservatively. As of 5/2 has not required any as needed Haldol dose. Behavior appears to be well-controlled.  Not agitated.  Not combative.   Patient is pending determination of a guardian for the patient and placement    Assessment and Plan: Agitation, Dementia with behavioral disturbances. Appreciate psychiatry consultation. Patient is currently on Seroquel, Depakote, Aricept. Trazodone for insomnia also ordered.  Switch from as needed to scheduled. Currently has a sitter primarily for safety and redirection. Not a candidate for inpatient psych admission. No evidence of infectious etiology right now. If has recurrence  of agitation would recommend to increase Seroquel dose at nighttime to 200 mg. 5/9 due to insomnia added melatonin 5 mg p.o. nightly  # Recent parainfluenza infection. Treated conservatively.  Diagnosed on 4/27.   # Hypotension, hypothermia. Resolved Reason for admission. Workup is unremarkable. Vitals are improved significantly.   # Bilateral lower extremity edema. Improved somewhat after receiving lasix 40 mg on 10/05/2022.    # Restless leg. Continue Requip at nighttime.   # Anxiety and depression. Appreciate psychiatry assistance. Continue current regimen.   # Vitamin B12 deficiency. On oral supplements.   # BPH. Continuing Flomax.  Body mass index is 24.74 kg/m.  Interventions:       Diet: Regular diet DVT Prophylaxis: Subcutaneous Lovenox   Advance goals of care discussion: Full code  Family Communication: family was not present at bedside, at the time of interview.  The pt provided permission to discuss medical plan with the family. Opportunity was given to ask question and all questions were answered satisfactorily.   Disposition:  Pt is from  [facility] nursing facility in dementia unit, admitted with AMS, behavioral issue. still has behavioral issue, pending determination of guardian and placement, which precludes a safe discharge. Discharge to dementia unit, when bed will be available, awaiting for guardianship.  TOC is following for placement  Subjective: No significant events overnight, as per RN patient did not sleep well last night and he was having multiple naps in the daytime.  Patient was seen while walking with mobility aid, patient was feeling weak he could not walk back to the room so he was being seated on the chair.  Patient did not offer any complaints.  Physical Exam: General: NAD,  sitting comfortably Appear in no distress, affect depressed Eyes: PERRLA ENT: Oral Mucosa Clear, moist  Neck: no JVD,  Cardiovascular: S1 and S2 Present, no  Murmur,  Respiratory: good respiratory effort, Bilateral Air entry equal and Decreased, no Crackles, no wheezes Abdomen: Bowel Sound present, Soft and no tenderness,  Skin: no rashes Extremities: mild Pedal edema, no calf tenderness Neurologic: without any new focal findings Gait not checked due to patient safety concerns  Vitals:   10/10/22 0143 10/10/22 2034 10/11/22 0347 10/11/22 0758  BP: 122/74 104/64 108/64 138/70  Pulse: 90 82 70 73  Resp: 18 16 16 16   Temp: 97.7 F (36.5 C) 97.8 F (36.6 C) 97.7 F (36.5 C) 98.2 F (36.8 C)  TempSrc:  Oral Oral Oral  SpO2: 95% 98% 100% 100%  Weight:      Height:       No intake or output data in the 24 hours ending 10/11/22 1601  Filed Weights   08/08/22 1205 08/14/22 0038 09/07/22 0057  Weight: 81 kg 78.2 kg 78.2 kg    Data Reviewed: I have personally reviewed and interpreted daily labs, tele strips, imagings as discussed above. I reviewed all nursing notes, pharmacy notes, vitals, pertinent old records I have discussed plan of care as described above with RN and patient/family.  CBC: Recent Labs  Lab 10/05/22 0553  WBC 3.9*  HGB 11.4*  HCT 35.1*  MCV 94.6  PLT 266   Basic Metabolic Panel: Recent Labs  Lab 10/05/22 0553  NA 137  K 4.0  CL 105  CO2 25  GLUCOSE 86  BUN 26*  CREATININE 1.20  CALCIUM 8.8*  MG 2.2    Studies: No results found.  Scheduled Meds:  bisacodyl  10 mg Rectal Once   vitamin B-12  1,000 mcg Oral Daily   divalproex  500 mg Oral Q12H   donepezil  5 mg Oral Daily   enoxaparin (LOVENOX) injection  40 mg Subcutaneous Q24H   feeding supplement  237 mL Oral BID BM   loratadine  10 mg Oral Daily   melatonin  5 mg Oral QHS   polyethylene glycol  17 g Oral BID   pravastatin  40 mg Oral QHS   QUEtiapine  100 mg Oral TID   rOPINIRole  0.5 mg Oral QHS   tamsulosin  0.4 mg Oral QPC supper   traZODone  50 mg Oral QHS   Continuous Infusions: PRN Meds: acetaminophen, bisacodyl,  diphenhydrAMINE, guaiFENesin-dextromethorphan  Time spent: 35 minutes  Author: Gillis Santa. MD [facility] 10/11/2022 4:01 PM  To reach On-call, see care teams to locate the attending and reach out to them via [email or web address]. If 7PM-7AM, please contact night-coverage If you still have difficulty reaching the attending provider, please page the [facility] (Director on Call) for [facility] on amion for assistance.

## 2022-10-12 ENCOUNTER — Inpatient Hospital Stay: Payer: Medicare HMO

## 2022-10-12 MED ORDER — TRAZODONE HCL 50 MG PO TABS
100.0000 mg | ORAL_TABLET | Freq: Every day | ORAL | Status: DC
  Administered 2022-10-12 – 2022-10-19 (×8): 100 mg via ORAL
  Filled 2022-10-12 (×8): qty 2

## 2022-10-12 MED ORDER — FUROSEMIDE 40 MG PO TABS
40.0000 mg | ORAL_TABLET | Freq: Two times a day (BID) | ORAL | Status: DC
  Administered 2022-10-12 – 2022-10-13 (×2): 40 mg via ORAL
  Filled 2022-10-12 (×2): qty 1

## 2022-10-12 MED ORDER — ZOLPIDEM TARTRATE 5 MG PO TABS
5.0000 mg | ORAL_TABLET | Freq: Every day | ORAL | Status: AC
  Administered 2022-10-12: 5 mg via ORAL
  Filled 2022-10-12: qty 1

## 2022-10-12 MED ORDER — FUROSEMIDE 10 MG/ML IJ SOLN: 40.0000 mg | Freq: Two times a day (BID) | INTRAMUSCULAR | Status: DC

## 2022-10-12 NOTE — Progress Notes (Signed)
Mobility Specialist - Progress Note   10/12/22 1300  Mobility  Activity Ambulated with assistance in hallway  Level of Assistance Standby assist, set-up cues, supervision of patient - no hands on  Assistive Device Front wheel walker  Distance Ambulated (ft) 160 ft  Activity Response Tolerated well  Mobility Referral Yes  $Mobility charge 1 Mobility  Mobility Specialist Start Time (ACUTE ONLY) 1252  Mobility Specialist Stop Time (ACUTE ONLY) 1305  Mobility Specialist Time Calculation (min) (ACUTE ONLY) 13 min   Pt OOB on RA upon arrival. Pt ambulates in hallway SBA. Pt needs VC for safety awareness throughout. Pt left in recliner with needs in reach and sitter present.   Terry Sloan  Mobility Specialist  10/12/22 1:09 PM

## 2022-10-12 NOTE — Progress Notes (Signed)
Mobility Specialist - Progress Note   10/12/22 0810  Mobility  Activity Ambulated with assistance in hallway;Stood at bedside;Dangled on edge of bed  Level of Assistance Standby assist, set-up cues, supervision of patient - no hands on  Assistive Device Front wheel walker  Distance Ambulated (ft) 320 ft  Activity Response Tolerated well  Mobility Referral Yes  $Mobility charge 1 Mobility  Mobility Specialist Start Time (ACUTE ONLY) 0754  Mobility Specialist Stop Time (ACUTE ONLY) 0809  Mobility Specialist Time Calculation (min) (ACUTE ONLY) 15 min   Pt sitting EOB on RA upon arrival. Pt STS and ambulates 2 laps in hallway. Pt needs VC for correct RW usage and safety awareness throughout. Pt left in recliner with needs in reach and sitter present.   Terrilyn Saver  Mobility Specialist  10/12/22 8:12 AM

## 2022-10-12 NOTE — Progress Notes (Signed)
[facility] Progress Note  Patient: Terry Sloan    ZOX:[id number]  DOA: 08/08/2022     Date of Service: the patient was seen and examined on 10/12/2022  Chief Complaint  Patient presents with   Psychiatric Evaluation   Brief hospital course: [name] is 82 year old male is a 82 year old male with history of dementia, behavioral disturbance, hyperlipidemia, BPH, insomnia, depression, anxiety, who presents to emergency department from [facility] rehab dementia department on 08/08/2022 for chief concerns of aggression with aggressive outburst towards staff. Presented to [name] ED on 2/24 due to agitation.  Was in the ED until 3/1 as he required SNF placement.  Sent to [place] rehab and [name].  On 3/7 returned back to ER for confusion, combative with staff.  Was IVC in the ED.Behavioral health was consulted.  TOC was consulted for placement to SNF again. however, was declined by facilities due to need for as needed Haldol.   On 4/5 EDP requesting admission for hypotension, hypothermia and change in mentation.  Symptoms resolved by 4/6. Workup was unremarkable for any significant infectious etiology. Patient continues to be seen by psychiatry to help manage his agitation. During the hospitalization had parainfluenza viral infection on 4/27 treated conservatively. As of 5/2 has not required any as needed Haldol dose. Behavior appears to be well-controlled.  Not agitated.  Not combative.   Patient is pending determination of a guardian for the patient and placement    Assessment and Plan: Agitation, Dementia with behavioral disturbances. Appreciate psychiatry consultation. Patient is currently on Seroquel, Depakote, Aricept. Trazodone for insomnia also ordered.  Switch from as needed to scheduled. Currently has a sitter primarily for safety and redirection. Not a candidate for inpatient psych admission. No evidence of infectious etiology right now. If has recurrence  of agitation would recommend to increase Seroquel dose at nighttime to 200 mg. 5/9 due to insomnia added melatonin 5 mg p.o. nightly 5/11 still has insomnia, increase trazodone from 50 to 100 mg p.o. daily and one-time dose of Ambien ordered for induction of sleep  # Recent parainfluenza infection. Treated conservatively.  Diagnosed on 4/27.   # Hypotension, hypothermia. Resolved Reason for admission. Workup is unremarkable. Vitals are improved significantly.   # Bilateral lower extremity edema. Improved somewhat after receiving lasix 40 mg on 10/05/2022.  5/11 again noticed worsening of bilateral lower extremity edema, right greater than left Follow venous duplex to rule out DVT Started Lasix 40 mg p.o. twice daily Keep legs elevated  # Restless leg. Continue Requip at nighttime.   # Anxiety and depression. Appreciate psychiatry assistance. Continue current regimen.   # Vitamin B12 deficiency. On oral supplements.   # BPH. Continuing Flomax.  Body mass index is 24.74 kg/m.  Interventions:       Diet: Regular diet DVT Prophylaxis: Subcutaneous Lovenox   Advance goals of care discussion: Full code  Family Communication: family was not present at bedside, at the time of interview.  The pt provided permission to discuss medical plan with the family. Opportunity was given to ask question and all questions were answered satisfactorily.   Disposition:  Pt is from  [facility] nursing facility in dementia unit, admitted with AMS, behavioral issue. still has behavioral issue, pending determination of guardian and placement, which precludes a safe discharge. Discharge to dementia unit, when bed will be available, awaiting for guardianship.  TOC is following for placement  Subjective: No significant events overnight except insomnia, patient is not able to sleep  for the past few nights.  Patient was sitting comfortably at the edge of bed, denied any complaints.  Patient he did  allow me to examine his legs, has significant edema.  No tenderness.  Denies any active issues.  Physical Exam: General: NAD, sitting comfortably Appear in no distress, affect depressed Eyes: PERRLA ENT: Oral Mucosa Clear, moist  Neck: no JVD,  Cardiovascular: S1 and S2 Present, no Murmur,  Respiratory: good respiratory effort, Bilateral Air entry equal and Decreased, no Crackles, no wheezes Abdomen: Bowel Sound present, Soft and no tenderness,  Skin: no rashes Extremities: 4+  Pedal edema R>L,  no calf tenderness Neurologic: without any new focal findings Gait not checked due to patient safety concerns  Vitals:   10/11/22 0758 10/11/22 1558 10/11/22 1914 10/12/22 0407  BP: 138/70 107/65 106/64 (!) 113/56  Pulse: 73 74 81 72  Resp: 16 16 18 16   Temp: 98.2 F (36.8 C) 98.2 F (36.8 C) 98.3 F (36.8 C) 98 F (36.7 C)  TempSrc: Oral Oral Oral   SpO2: 100% 97% 100% 99%  Weight:      Height:       No intake or output data in the 24 hours ending 10/12/22 1502  Filed Weights   08/08/22 1205 08/14/22 0038 09/07/22 0057  Weight: 81 kg 78.2 kg 78.2 kg    Data Reviewed: I have personally reviewed and interpreted daily labs, tele strips, imagings as discussed above. I reviewed all nursing notes, pharmacy notes, vitals, pertinent old records I have discussed plan of care as described above with RN and patient/family.  CBC: No results for input(s): "WBC", "NEUTROABS", "HGB", "HCT", "MCV", "PLT" in the last 168 hours.  Basic Metabolic Panel: No results for input(s): "NA", "K", "CL", "CO2", "GLUCOSE", "BUN", "CREATININE", "CALCIUM", "MG", "PHOS" in the last 168 hours.   Studies: No results found.  Scheduled Meds:  bisacodyl  10 mg Rectal Once   vitamin B-12  1,000 mcg Oral Daily   divalproex  500 mg Oral Q12H   donepezil  5 mg Oral Daily   enoxaparin (LOVENOX) injection  40 mg Subcutaneous Q24H   feeding supplement  237 mL Oral BID BM   furosemide  40 mg Oral BID    loratadine  10 mg Oral Daily   melatonin  5 mg Oral QHS   polyethylene glycol  17 g Oral BID   pravastatin  40 mg Oral QHS   QUEtiapine  100 mg Oral TID   rOPINIRole  0.5 mg Oral QHS   tamsulosin  0.4 mg Oral QPC supper   traZODone  100 mg Oral QHS   zolpidem  5 mg Oral QHS   Continuous Infusions: PRN Meds: acetaminophen, bisacodyl, diphenhydrAMINE, guaiFENesin-dextromethorphan  Time spent: 35 minutes  Author: Gillis Santa. MD [facility] 10/12/2022 3:02 PM  To reach On-call, see care teams to locate the attending and reach out to them via [email or web address]. If 7PM-7AM, please contact night-coverage If you still have difficulty reaching the attending provider, please page the [facility] (Director on Call) for [facility] on amion for assistance.

## 2022-10-13 LAB — BASIC METABOLIC PANEL
Anion gap: 6 (ref 5–15)
BUN: 30 mg/dL — ABNORMAL HIGH (ref 8–23)
CO2: 29 mmol/L (ref 22–32)
Calcium: 8.8 mg/dL — ABNORMAL LOW (ref 8.9–10.3)
Chloride: 104 mmol/L (ref 98–111)
Creatinine, Ser: 1.44 mg/dL — ABNORMAL HIGH (ref 0.61–1.24)
GFR, Estimated: 48 mL/min — ABNORMAL LOW (ref >60)
Glucose, Bld: 105 mg/dL — ABNORMAL HIGH (ref 70–99)
Potassium: 4.1 mmol/L (ref 3.5–5.1)
Sodium: 139 mmol/L (ref 135–145)

## 2022-10-13 LAB — MAGNESIUM: Magnesium: 2.3 mg/dL (ref 1.7–2.4)

## 2022-10-13 LAB — CBC
HCT: 35.9 % — ABNORMAL LOW (ref 39.0–52.0)
Hemoglobin: 11.6 g/dL — ABNORMAL LOW (ref 13.0–17.0)
MCH: 30.7 pg (ref 26.0–34.0)
MCHC: 32.3 g/dL (ref 30.0–36.0)
MCV: 95 fL (ref 80.0–100.0)
Platelets: 260 10*3/uL (ref 150–400)
RBC: 3.78 MIL/uL — ABNORMAL LOW (ref 4.22–5.81)
RDW: 12.3 % (ref 11.5–15.5)
WBC: 4.9 10*3/uL (ref 4.0–10.5)
nRBC: 0 % (ref 0.0–0.2)

## 2022-10-13 LAB — PHOSPHORUS: Phosphorus: 4.7 mg/dL — ABNORMAL HIGH (ref 2.5–4.6)

## 2022-10-13 LAB — VITAMIN D 25 HYDROXY (VIT D DEFICIENCY, FRACTURES): Vit D, 25-Hydroxy: 30.43 ng/mL (ref 30–100)

## 2022-10-13 MED ORDER — LORAZEPAM 2 MG PO TABS
2.0000 mg | ORAL_TABLET | ORAL | Status: AC | PRN
  Administered 2022-10-14 (×2): 2 mg via ORAL
  Filled 2022-10-13 (×2): qty 1

## 2022-10-13 MED ORDER — LORAZEPAM 2 MG PO TABS: 2.0000 mg | ORAL_TABLET | ORAL | Status: DC | PRN

## 2022-10-13 NOTE — Progress Notes (Signed)
Mobility Specialist - Progress Note   10/13/22 1034  Mobility  Activity Ambulated with assistance in hallway  Level of Assistance Standby assist, set-up cues, supervision of patient - no hands on  Assistive Device Front wheel walker  Distance Ambulated (ft) 160 ft  Activity Response Tolerated well  Mobility Referral Yes  $Mobility charge 1 Mobility  Mobility Specialist Start Time (ACUTE ONLY) 1008  Mobility Specialist Stop Time (ACUTE ONLY) 1031  Mobility Specialist Time Calculation (min) (ACUTE ONLY) 23 min   Pt OOB in middle of room upon arrival. Pt ambulates in hallway SBA. Pt needs VC for safety awareness throughout. Pt left EOB with needs in reach and sitter present.  Terrilyn Saver  Mobility Specialist  10/13/22 10:36 AM

## 2022-10-13 NOTE — Progress Notes (Signed)
Telephone Call with Eunice Blase in Ultrasound at [facility]; advised her that Dr Lucianne Muss still wants the Venous U/S BLE performed; pt has PRN Ativan PO that needs to be given prior to performing U/S; need to coordinate with U/S when to give PRN Ativan in order for the U/S to be performed with the pt cooperating.  Debiie advised me that she will have someone from Ultrasound call in the am 10/14/22 to schedule a time to perform the U/S

## 2022-10-13 NOTE — Progress Notes (Signed)
[facility] Progress Note  Patient: Terry Sloan    WUJ:[id number]  DOA: 08/08/2022     Date of Service: the patient was seen and examined on 10/13/2022  Chief Complaint  Patient presents with   Psychiatric Evaluation   Brief hospital course: [name] is 82 year old male is a 82 year old male with history of dementia, behavioral disturbance, hyperlipidemia, BPH, insomnia, depression, anxiety, who presents to emergency department from [facility] rehab dementia department on 08/08/2022 for chief concerns of aggression with aggressive outburst towards staff. Presented to [name] ED on 2/24 due to agitation.  Was in the ED until 3/1 as he required SNF placement.  Sent to [place] rehab and [name].  On 3/7 returned back to ER for confusion, combative with staff.  Was IVC in the ED.Behavioral health was consulted.  TOC was consulted for placement to SNF again. however, was declined by facilities due to need for as needed Haldol.   On 4/5 EDP requesting admission for hypotension, hypothermia and change in mentation.  Symptoms resolved by 4/6. Workup was unremarkable for any significant infectious etiology. Patient continues to be seen by psychiatry to help manage his agitation. During the hospitalization had parainfluenza viral infection on 4/27 treated conservatively. As of 5/2 has not required any as needed Haldol dose. Behavior appears to be well-controlled.  Not agitated.  Not combative.   Patient is pending determination of a guardian for the patient and placement    Assessment and Plan: Agitation, Dementia with behavioral disturbances. Appreciate psychiatry consultation. Patient is currently on Seroquel, Depakote, Aricept. Trazodone for insomnia also ordered.  Switch from as needed to scheduled. Currently has a sitter primarily for safety and redirection. Not a candidate for inpatient psych admission. No evidence of infectious etiology right now. If has recurrence  of agitation would recommend to increase Seroquel dose at nighttime to 200 mg. 5/9 due to insomnia added melatonin 5 mg p.o. nightly 5/11 still has insomnia, increase trazodone from 50 to 100 mg p.o. daily and one-time dose of Ambien ordered for induction of sleep  # Recent parainfluenza infection. Treated conservatively.  Diagnosed on 4/27.   # Hypotension, hypothermia. Resolved Reason for admission. Workup is unremarkable. Vitals are improved significantly.   # Bilateral lower extremity edema. Improved somewhat after receiving lasix 40 mg on 10/05/2022.  5/11 again noticed worsening of bilateral lower extremity edema, right greater than left Follow venous duplex to rule out DVT S/p Lasix 40 mg p.o. BID x 2 doses, developed AKI due to dehydration, creatinine 1.44 slightly elevated so discontinued Lasix Keep legs elevated, apply ice wraps Patient is not cooperative for DVT study, ordered Ativan for as needed use and advised RN to try again to get DVT study done.   # Restless leg. Continue Requip at nighttime.   # Anxiety and depression. Appreciate psychiatry assistance. Continue current regimen.   # Vitamin B12 deficiency. On oral supplements.   # BPH. Continuing Flomax.  Body mass index is 24.74 kg/m.  Interventions:       Diet: Regular diet DVT Prophylaxis: Subcutaneous Lovenox   Advance goals of care discussion: Full code  Family Communication: family was not present at bedside, at the time of interview.  The pt provided permission to discuss medical plan with the family. Opportunity was given to ask question and all questions were answered satisfactorily.   Disposition:  Pt is from  [facility] nursing facility in dementia unit, admitted with AMS, behavioral issue. still has behavioral issue, pending  determination of guardian and placement, which precludes a safe discharge. Discharge to dementia unit, when bed will be available, awaiting for guardianship.  TOC is  following for placement  Subjective: No significant events overnight patient was sitting comfortably in the bed, still has lower extremity edema, stated that it is because he is not moving much.  Does not feel any discomfort.  Patient did not offer any complaints.   Physical Exam: General: NAD, sitting comfortably Appear in no distress, affect depressed Eyes: PERRLA ENT: Oral Mucosa Clear, moist  Neck: no JVD,  Cardiovascular: S1 and S2 Present, no Murmur,  Respiratory: good respiratory effort, Bilateral Air entry equal and Decreased, no Crackles, no wheezes Abdomen: Bowel Sound present, Soft and no tenderness,  Skin: no rashes Extremities: 4+  Pedal edema R>L,  no calf tenderness Neurologic: without any new focal findings Gait not checked due to patient safety concerns  Vitals:   10/12/22 0407 10/12/22 1538 10/12/22 1900 10/13/22 0839  BP: (!) 113/56 119/65 120/89 120/73  Pulse: 72 75 87 71  Resp: 16 16 18 17   Temp: 98 F (36.7 C)   98 F (36.7 C)  TempSrc:      SpO2: 99% 100% 100% 100%  Weight:      Height:        Intake/Output Summary (Last 24 hours) at 10/13/2022 1601 Last data filed at 10/12/2022 1737 Gross per 24 hour  Intake --  Output 250 ml  Net -250 ml    Filed Weights   08/08/22 1205 08/14/22 0038 09/07/22 0057  Weight: 81 kg 78.2 kg 78.2 kg    Data Reviewed: I have personally reviewed and interpreted daily labs, tele strips, imagings as discussed above. I reviewed all nursing notes, pharmacy notes, vitals, pertinent old records I have discussed plan of care as described above with RN and patient/family.  CBC: Recent Labs  Lab 10/13/22 0511  WBC 4.9  HGB 11.6*  HCT 35.9*  MCV 95.0  PLT 260    Basic Metabolic Panel: Recent Labs  Lab 10/13/22 0511  NA 139  K 4.1  CL 104  CO2 29  GLUCOSE 105*  BUN 30*  CREATININE 1.44*  CALCIUM 8.8*  MG 2.3  PHOS 4.7*     Studies: No results found.  Scheduled Meds:  bisacodyl  10 mg Rectal Once    vitamin B-12  1,000 mcg Oral Daily   divalproex  500 mg Oral Q12H   donepezil  5 mg Oral Daily   enoxaparin (LOVENOX) injection  40 mg Subcutaneous Q24H   feeding supplement  237 mL Oral BID BM   loratadine  10 mg Oral Daily   melatonin  5 mg Oral QHS   polyethylene glycol  17 g Oral BID   pravastatin  40 mg Oral QHS   QUEtiapine  100 mg Oral TID   rOPINIRole  0.5 mg Oral QHS   tamsulosin  0.4 mg Oral QPC supper   traZODone  100 mg Oral QHS   Continuous Infusions: PRN Meds: acetaminophen, bisacodyl, diphenhydrAMINE, guaiFENesin-dextromethorphan, LORazepam  Time spent: 35 minutes  Author: Gillis Santa. MD [facility] 10/13/2022 4:01 PM  To reach On-call, see care teams to locate the attending and reach out to them via [email or web address]. If 7PM-7AM, please contact night-coverage If you still have difficulty reaching the attending provider, please page the [facility] (Director on Call) for [facility] on amion for assistance.

## 2022-10-14 ENCOUNTER — Inpatient Hospital Stay: Payer: Medicare HMO

## 2022-10-14 LAB — BASIC METABOLIC PANEL
Anion gap: 7 (ref 5–15)
BUN: 31 mg/dL — ABNORMAL HIGH (ref 8–23)
CO2: 29 mmol/L (ref 22–32)
Calcium: 8.9 mg/dL (ref 8.9–10.3)
Chloride: 104 mmol/L (ref 98–111)
Creatinine, Ser: 1.37 mg/dL — ABNORMAL HIGH (ref 0.61–1.24)
GFR, Estimated: 51 mL/min — ABNORMAL LOW (ref >60)
Glucose, Bld: 101 mg/dL — ABNORMAL HIGH (ref 70–99)
Potassium: 4 mmol/L (ref 3.5–5.1)
Sodium: 140 mmol/L (ref 135–145)

## 2022-10-14 MED ORDER — VITAMIN D (ERGOCALCIFEROL) 1.25 MG (50000 UNIT) PO CAPS
50000.0000 [IU] | ORAL_CAPSULE | ORAL | Status: DC
  Administered 2022-10-21: 50000 [IU] via ORAL
  Filled 2022-10-14 (×4): qty 1

## 2022-10-14 NOTE — Progress Notes (Signed)
Compression stockings applied, 14.5 cm on left calf and 15.5 cm on right calf.

## 2022-10-14 NOTE — TOC Progression Note (Signed)
Transition of Care [facility]) - Progression Note    Patient Details  Name: Terry Sloan MRN: [id number] Date of Birth: 02/03/1941  Transition of Care [facility]) CM/SW Contact  Darleene Cleaver, [place] Phone Number: 10/14/2022, 5:29 PM  Clinical Narrative:     Still working on trying to find placement for patient in a memory care unit.  No facility options at this time, will continue to look for other placement.  Expected Discharge Plan:  (TBD) Barriers to Discharge: Continued Medical Work up  Expected Discharge Plan and Services                                               Social Determinants of Health (SDOH) Interventions SDOH Screenings   Food Insecurity: Patient Unable To Answer (09/07/2022)  Housing: Low Risk  (09/07/2022)  Transportation Needs: Patient Unable To Answer (09/07/2022)  Utilities: Not At Risk (07/23/2022)  Alcohol Screen: Low Risk  (07/23/2022)  Depression (PHQ2-9): Low Risk  (07/23/2022)  Recent Concern: Depression (PHQ2-9) - Medium Risk (06/17/2022)  Financial Resource Strain: Low Risk  (07/23/2022)  Physical Activity: Insufficiently Active (07/23/2022)  Social Connections: Socially Isolated (07/23/2022)  Stress: No Stress Concern Present (07/23/2022)  Tobacco Use: Medium Risk (09/11/2022)    Readmission Risk Interventions     No data to display

## 2022-10-14 NOTE — Progress Notes (Signed)
[facility] Progress Note  Patient: Terry Sloan    WUJ:[id number]  DOA: 08/08/2022     Date of Service: the patient was seen and examined on 10/14/2022  Chief Complaint  Patient presents with   Psychiatric Evaluation   Brief hospital course: [name] is 82 year old male is a 82 year old male with history of dementia, behavioral disturbance, hyperlipidemia, BPH, insomnia, depression, anxiety, who presents to emergency department from [facility] rehab dementia department on 08/08/2022 for chief concerns of aggression with aggressive outburst towards staff. Presented to [facility], ED on 2/24 due to agitation.  Was in the ED until 3/1 as he required SNF placement.  Sent to [facility] and [name].  On 3/7 returned back to ER for confusion, combative with staff.  Was IVC in the ED.Behavioral health was consulted.  TOC was consulted for placement to SNF again. however, was declined by facilities due to need for as needed Haldol.   On 4/5 EDP requesting admission for hypotension, hypothermia and change in mentation.  Symptoms resolved by 4/6. Workup was unremarkable for any significant infectious etiology. Patient continues to be seen by psychiatry to help manage his agitation. During the hospitalization had parainfluenza viral infection on 4/27 treated conservatively. As of 5/2 has not required any as needed Haldol dose. Behavior appears to be well-controlled.  Not agitated.  Not combative.   Patient is pending determination of a guardian for the patient and placement    Assessment and Plan: Agitation, Dementia with behavioral disturbances. Appreciate psychiatry consultation. Patient is currently on Seroquel, Depakote, Aricept. Trazodone for insomnia also ordered.  Switch from as needed to scheduled. Currently has a sitter primarily for safety and redirection. Not a candidate for inpatient psych admission. No evidence of infectious etiology right now. If has recurrence  of agitation would recommend to increase Seroquel dose at nighttime to 200 mg. 5/9 due to insomnia added melatonin 5 mg p.o. nightly 5/11 still has insomnia, increase trazodone from 50 to 100 mg p.o. daily and one-time dose of Ambien ordered for induction of sleep  # Recent parainfluenza infection. Treated conservatively.  Diagnosed on 4/27.   # Hypotension, hypothermia. Resolved Reason for admission. Workup is unremarkable. Vitals are improved significantly.   # Bilateral lower extremity edema. Improved somewhat after receiving lasix 40 mg on 10/05/2022.  5/11 again noticed worsening of bilateral lower extremity edema, right greater than left Follow venous duplex to rule out DVT S/p Lasix 40 mg p.o. BID x 2 doses, developed AKI due to dehydration, creatinine 1.44 slightly elevated so discontinued Lasix Keep legs elevated, apply ice wraps Patient is not cooperative for DVT study, ordered Ativan for as needed use and advised RN to try again to get DVT study done. 5/13 patient was given Ativan before venous duplex, negative for DVT. Compression stockings orders placed  # Restless leg. Continue Requip at nighttime.   # Anxiety and depression. Appreciate psychiatry assistance. Continue current regimen.   # Vitamin B12 deficiency. On oral supplements.   # BPH. Continuing Flomax.  Body mass index is 24.74 kg/m.  Interventions:       Diet: Regular diet DVT Prophylaxis: Subcutaneous Lovenox   Advance goals of care discussion: Full code  Family Communication: family was not present at bedside, at the time of interview.  The pt provided permission to discuss medical plan with the family. Opportunity was given to ask question and all questions were answered satisfactorily.   Disposition:  Pt is from  [facility] nursing  facility in dementia unit, admitted with AMS, behavioral issue. still has behavioral issue, pending determination of guardian and placement, which precludes a  safe discharge. Discharge to dementia unit, when bed will be available, awaiting for guardianship.  TOC is following for placement  Subjective: No significant events overnight, patient did not sleep well last night, patient was given Ativan in the morning for venous duplex and then patient was sleepy during my exam. Patient was lying comfortably without any acute distress.   Physical Exam: General: NAD, laying comfortably Appear in no distress, affect depressed Eyes: Closed, sleepy ENT: Oral Mucosa Clear, moist  Neck: no JVD,  Cardiovascular: S1 and S2 Present, no Murmur,  Respiratory: good respiratory effort, Bilateral Air entry equal and Decreased, no Crackles, no wheezes Abdomen: Bowel Sound present, Soft and no tenderness,  Skin: no rashes Extremities: 4+  Pedal edema R>L,  no calf tenderness Neurologic: without any new focal findings Gait not checked due to patient safety concerns  Vitals:   10/13/22 0839 10/13/22 1950 10/14/22 0413 10/14/22 0724  BP: 120/73 (!) 106/58 (!) 96/56 112/65  Pulse: 71 84 78 78  Resp: 17 16 20 16   Temp: 98 F (36.7 C) 98.9 F (37.2 C) 98 F (36.7 C) 98.1 F (36.7 C)  TempSrc:  Oral Oral Oral  SpO2: 100% 100% 100% 96%  Weight:      Height:       No intake or output data in the 24 hours ending 10/14/22 1556   Filed Weights   08/08/22 1205 08/14/22 0038 09/07/22 0057  Weight: 81 kg 78.2 kg 78.2 kg    Data Reviewed: I have personally reviewed and interpreted daily labs, tele strips, imagings as discussed above. I reviewed all nursing notes, pharmacy notes, vitals, pertinent old records I have discussed plan of care as described above with RN and patient/family.  CBC: Recent Labs  Lab 10/13/22 0511  WBC 4.9  HGB 11.6*  HCT 35.9*  MCV 95.0  PLT 260    Basic Metabolic Panel: Recent Labs  Lab 10/13/22 0511 10/14/22 0530  NA 139 140  K 4.1 4.0  CL 104 104  CO2 29 29  GLUCOSE 105* 101*  BUN 30* 31*  CREATININE 1.44* 1.37*   CALCIUM 8.8* 8.9  MG 2.3  --   PHOS 4.7*  --      Studies: US Venous Img Lower Bilateral (DVT)  Result Date: 10/14/2022 CLINICAL DATA:  Bilateral lower extremity edema. EXAM: BILATERAL LOWER EXTREMITY VENOUS DOPPLER ULTRASOUND TECHNIQUE: Gray-scale sonography with graded compression, as well as color Doppler and duplex ultrasound were performed to evaluate the lower extremity deep venous systems from the level of the common femoral vein and including the common femoral, femoral, profunda femoral, popliteal and calf veins including the posterior tibial, peroneal and gastrocnemius veins when visible. The superficial great saphenous vein was also interrogated. Spectral Doppler was utilized to evaluate flow at rest and with distal augmentation maneuvers in the common femoral, femoral and popliteal veins. COMPARISON:  None Available. FINDINGS: RIGHT LOWER EXTREMITY Common Femoral Vein: No evidence of thrombus. Normal compressibility, respiratory phasicity and response to augmentation. Saphenofemoral Junction: No evidence of thrombus. Normal compressibility and flow on color Doppler imaging. Profunda Femoral Vein: No evidence of thrombus. Normal compressibility and flow on color Doppler imaging. Femoral Vein: No evidence of thrombus. Normal compressibility, respiratory phasicity and response to augmentation. Popliteal Vein: No evidence of thrombus. Normal compressibility, respiratory phasicity and response to augmentation. Calf Veins: No evidence of thrombus. Normal  compressibility and flow on color Doppler imaging. Superficial Great Saphenous Vein: No evidence of thrombus. Normal compressibility. Venous Reflux:  None. Other Findings: No evidence of superficial thrombophlebitis or abnormal fluid collection. LEFT LOWER EXTREMITY Common Femoral Vein: No evidence of thrombus. Normal compressibility, respiratory phasicity and response to augmentation. Saphenofemoral Junction: No evidence of thrombus. Normal  compressibility and flow on color Doppler imaging. Profunda Femoral Vein: No evidence of thrombus. Normal compressibility and flow on color Doppler imaging. Femoral Vein: No evidence of thrombus. Normal compressibility, respiratory phasicity and response to augmentation. Popliteal Vein: No evidence of thrombus. Normal compressibility, respiratory phasicity and response to augmentation. Calf Veins: No evidence of thrombus. Normal compressibility and flow on color Doppler imaging. Superficial Great Saphenous Vein: No evidence of thrombus. Normal compressibility. Venous Reflux:  None. Other Findings: No evidence of superficial thrombophlebitis or abnormal fluid collection. IMPRESSION: No evidence of deep venous thrombosis in either lower extremity. Electronically Signed   By: Irish Lack M.D.   On: 10/14/2022 09:03    Scheduled Meds:  bisacodyl  10 mg Rectal Once   vitamin B-12  1,000 mcg Oral Daily   divalproex  500 mg Oral Q12H   donepezil  5 mg Oral Daily   enoxaparin (LOVENOX) injection  40 mg Subcutaneous Q24H   feeding supplement  237 mL Oral BID BM   loratadine  10 mg Oral Daily   melatonin  5 mg Oral QHS   polyethylene glycol  17 g Oral BID   pravastatin  40 mg Oral QHS   QUEtiapine  100 mg Oral TID   rOPINIRole  0.5 mg Oral QHS   tamsulosin  0.4 mg Oral QPC supper   traZODone  100 mg Oral QHS   Vitamin D (Ergocalciferol)  50,000 Units Oral Q7 days   Continuous Infusions: PRN Meds: acetaminophen, bisacodyl, diphenhydrAMINE, guaiFENesin-dextromethorphan, LORazepam  Time spent: 35 minutes  Author: Gillis Santa. MD [facility] 10/14/2022 3:56 PM  To reach On-call, see care teams to locate the attending and reach out to them via [email or web address]. If 7PM-7AM, please contact night-coverage If you still have difficulty reaching the attending provider, please page the [facility] (Director on Call) for [facility] on amion for assistance.

## 2022-10-15 LAB — URINALYSIS, W/ REFLEX TO CULTURE (INFECTION SUSPECTED)
Bacteria, UA: NONE SEEN
Bilirubin Urine: NEGATIVE
Glucose, UA: NEGATIVE mg/dL
Hgb urine dipstick: NEGATIVE
Ketones, ur: NEGATIVE mg/dL
Nitrite: NEGATIVE
Protein, ur: NEGATIVE mg/dL
Specific Gravity, Urine: 1.015 (ref 1.005–1.030)
Squamous Epithelial / HPF: NONE SEEN /HPF (ref 0–5)
pH: 7 (ref 5.0–8.0)

## 2022-10-15 LAB — BASIC METABOLIC PANEL
Anion gap: 4 — ABNORMAL LOW (ref 5–15)
BUN: 32 mg/dL — ABNORMAL HIGH (ref 8–23)
CO2: 27 mmol/L (ref 22–32)
Calcium: 8.5 mg/dL — ABNORMAL LOW (ref 8.9–10.3)
Chloride: 106 mmol/L (ref 98–111)
Creatinine, Ser: 1.23 mg/dL (ref 0.61–1.24)
GFR, Estimated: 58 mL/min — ABNORMAL LOW (ref >60)
Glucose, Bld: 94 mg/dL (ref 70–99)
Potassium: 3.6 mmol/L (ref 3.5–5.1)
Sodium: 137 mmol/L (ref 135–145)

## 2022-10-15 MED ORDER — FUROSEMIDE 20 MG PO TABS
20.0000 mg | ORAL_TABLET | Freq: Every day | ORAL | Status: DC
  Administered 2022-10-15 – 2022-10-17 (×3): 20 mg via ORAL
  Filled 2022-10-15 (×3): qty 1

## 2022-10-15 MED ORDER — HALOPERIDOL LACTATE 5 MG/ML IJ SOLN
5.0000 mg | Freq: Four times a day (QID) | INTRAMUSCULAR | Status: DC | PRN
  Administered 2022-10-20 – 2022-10-27 (×5): 5 mg via INTRAMUSCULAR
  Filled 2022-10-15 (×5): qty 1

## 2022-10-15 MED ORDER — HALOPERIDOL 0.5 MG PO TABS: 5.0000 mg | ORAL_TABLET | Freq: Four times a day (QID) | ORAL | Status: DC | PRN

## 2022-10-15 MED ORDER — HALOPERIDOL 5 MG PO TABS
5.0000 mg | ORAL_TABLET | Freq: Four times a day (QID) | ORAL | Status: DC | PRN
  Administered 2022-10-15 – 2022-10-30 (×10): 5 mg via ORAL
  Filled 2022-10-15 (×17): qty 1

## 2022-10-15 MED ORDER — HALOPERIDOL LACTATE 5 MG/ML IJ SOLN: 5.0000 mg | Freq: Four times a day (QID) | INTRAMUSCULAR | Status: DC | PRN

## 2022-10-15 NOTE — NC FL2 (Signed)
[place] MEDICAID FL2 LEVEL OF CARE FORM     IDENTIFICATION  Patient Name: Terry Sloan Birthdate: 1941-01-28 Sex: male Admission Date (Current Location): 08/08/2022  [facility] and [place] Number:  Chiropodist and Address:  [facility], [address] [place], [place] [address]      Provider Number: [id number]  Attending Physician Name and Address:  Gillis Santa, MD  Relative Name and Phone Number:  Annia Belt Daughter   [phone]  [place], [place]   [phone]    Current Level of Care: Hospital Recommended Level of Care: Assisted Living Facility, Memory Care Prior Approval Number:    Date Approved/Denied:   PASRR Number: [id number]  Discharge Plan: Domiciliary (Rest home) (Memory Care)    Current Diagnoses: Patient Active Problem List   Diagnosis Date Noted    Dementia with behavioral disturbance (HCC)    Parainfluenza infection 07/27/2022   09/29/2022   Vitamin B12 deficiency 09/12/2022   Altered mental status 09/06/2022   Hypothermia 09/06/2022   Restless leg 09/06/2022       OAB (overactive bladder) 01/25/2019   BPH (benign prostatic hyperplasia) 07/27/2018   History of total left knee replacement 04/17/2017   History of TIA (transient ischemic attack) 10/16/2016   Aphasia 10/16/2016   Anxiety and depression 10/16/2016   PVD (peripheral vascular disease) (HCC) 09/23/2016   Mixed hyperlipidemia 09/23/2016   Weight loss, unintentional 09/23/2016    Orientation RESPIRATION BLADDER Height & Weight     Self  Normal Incontinent Weight: 172 lb 6.4 oz (78.2 kg) Height:  5\' 10"  (177.8 cm)  BEHAVIORAL SYMPTOMS/MOOD NEUROLOGICAL BOWEL NUTRITION STATUS      Incontinent Diet  AMBULATORY STATUS COMMUNICATION OF NEEDS Skin   Limited Assist Verbally Normal                       Personal Care Assistance Level of Assistance  Bathing, Feeding, Dressing Bathing Assistance: Limited assistance Feeding assistance: Limited  assistance Dressing Assistance: Limited assistance     Functional Limitations Info  Sight, Hearing, Speech Sight Info: Adequate Hearing Info: Adequate Speech Info: Adequate    SPECIAL CARE FACTORS FREQUENCY                 Contractures Contractures Info: Not present    Additional Factors Info  Code Status, Allergies, Psychotropic Code Status Info: Full Code Allergies Info: Celebrex (Celecoxib) Psychotropic Info: divalproex (DEPAKOTE SPRINKLE) capsule 250 mg, QUEtiapine (SEROQUEL) tablet 25 mg         Current Medications (10/15/2022):  This is the current hospital active medication list Current Facility-Administered Medications  Medication Dose Route Frequency Provider Last Rate Last Admin   acetaminophen (TYLENOL) tablet 650 mg  650 mg Oral Q6H PRN Bennett, Christal H, NP   650 mg at 10/15/22 1117   bisacodyl (DULCOLAX) suppository 10 mg  10 mg Rectal Once Gillis Santa, MD       bisacodyl (DULCOLAX) suppository 10 mg  10 mg Rectal Daily PRN Gillis Santa, MD       cyanocobalamin (VITAMIN B12) tablet 1,000 mcg  1,000 mcg Oral Daily Lurene Shadow, MD   1,000 mcg at 10/15/22 0815   diphenhydrAMINE (BENADRYL) capsule 25 mg  25 mg Oral Q6H PRN Gillis Santa, MD   25 mg at 10/11/22 1229   divalproex (DEPAKOTE SPRINKLE) capsule 500 mg  500 mg Oral Q12H Cordella Register A, RPH   500 mg at 10/15/22 0815   donepezil (ARICEPT) tablet 5 mg  5  mg Oral Daily Cox, Amy N, DO   5 mg at 10/15/22 0815   enoxaparin (LOVENOX) injection 40 mg  40 mg Subcutaneous Q24H Cox, Amy N, DO   40 mg at 10/14/22 2147   feeding supplement (ENSURE ENLIVE / ENSURE PLUS) liquid 237 mL  237 mL Oral BID BM Cox, Amy N, DO   237 mL at 10/15/22 1336   furosemide (LASIX) tablet 20 mg  20 mg Oral Daily Gillis Santa, MD   20 mg at 10/15/22 1117   guaiFENesin-dextromethorphan (ROBITUSSIN DM) 100-10 MG/5ML syrup 5 mL  5 mL Oral Q6H PRN Lurene Shadow, MD   5 mL at 10/02/22 0021   haloperidol (HALDOL) tablet 5 mg  5  mg Oral Q6H PRN Madueme, Elvira C, RPH   5 mg at 10/15/22 1546   Or   haloperidol lactate (HALDOL) injection 5 mg  5 mg Intramuscular Q6H PRN Madueme, Elvira C, RPH       loratadine (CLARITIN) tablet 10 mg  10 mg Oral Daily Gillis Santa, MD   10 mg at 10/15/22 0815   melatonin tablet 5 mg  5 mg Oral QHS Gillis Santa, MD   5 mg at 10/14/22 2139   polyethylene glycol (MIRALAX / GLYCOLAX) packet 17 g  17 g Oral BID Gillis Santa, MD   17 g at 10/13/22 0858   pravastatin (PRAVACHOL) tablet 40 mg  40 mg Oral QHS Cox, Amy N, DO   40 mg at 10/14/22 2139   QUEtiapine (SEROQUEL) tablet 100 mg  100 mg Oral TID Charm Rings, NP   100 mg at 10/15/22 1334   rOPINIRole (REQUIP) tablet 0.5 mg  0.5 mg Oral QHS Bennett, Christal H, NP   0.5 mg at 10/14/22 2141   tamsulosin (FLOMAX) capsule 0.4 mg  0.4 mg Oral QPC supper Cox, Amy N, DO   0.4 mg at 10/14/22 1736   traZODone (DESYREL) tablet 100 mg  100 mg Oral QHS Gillis Santa, MD   100 mg at 10/14/22 2139   Vitamin D (Ergocalciferol) (DRISDOL) 1.25 MG (50000 UNIT) capsule 50,000 Units  50,000 Units Oral Q7 days Gillis Santa, MD         Discharge Medications: Please see discharge summary for a list of discharge medications.  Relevant Imaging Results:  Relevant Lab Results:   Additional Information SSN [id number]  Darleene Cleaver, LCSW

## 2022-10-15 NOTE — Progress Notes (Signed)
[facility] Progress Note  Patient: Terry Sloan    ZOX:[id number]  DOA: 08/08/2022     Date of Service: the patient was seen and examined on 10/15/2022  Chief Complaint  Patient presents with   Psychiatric Evaluation   Brief hospital course: Terry Sloan is 82 year old male is a 82 year old male with history of dementia, behavioral disturbance, hyperlipidemia, BPH, insomnia, depression, anxiety, who presents to emergency department from [facility] rehab dementia department on 08/08/2022 for chief concerns of aggression with aggressive outburst towards staff. [facility] to [name] ED on 2/24 due to agitation.  Was in the ED until 3/1 as he required SNF placement.  Sent to [facility] and [name].  On 3/7 returned back to ER for confusion, combative with staff.  Was IVC in the ED.Behavioral health was consulted.  TOC was consulted for placement to SNF again. however, was declined by facilities due to need for as needed Haldol.   On 4/5 EDP requesting admission for hypotension, hypothermia and change in mentation.  Symptoms resolved by 4/6. Workup was unremarkable for any significant infectious etiology. Patient continues to be seen by psychiatry to help manage his agitation. During the hospitalization had parainfluenza viral infection on 4/27 treated conservatively. As of 5/2 has not required any as needed Haldol dose. Behavior appears to be well-controlled.  Not agitated.  Not combative.   Patient is pending determination of a guardian for the patient and placement    Assessment and Plan: Agitation, Dementia with behavioral disturbances. Appreciate psychiatry consultation. Patient is currently on Seroquel, Depakote, Aricept. Trazodone for insomnia also ordered.  Switch from as needed to scheduled. Currently has a sitter primarily for safety and redirection. Not a candidate for inpatient psych admission. No evidence of infectious etiology right now. If has recurrence  of agitation would recommend to increase Seroquel dose at nighttime to 200 mg. 5/9 due to insomnia added melatonin 5 mg p.o. nightly 5/11 still has insomnia, increase trazodone from 50 to 100 mg p.o. daily and one-time dose of Ambien ordered for induction of sleep  # Recent parainfluenza infection. Treated conservatively.  Diagnosed on 4/27.   # Hypotension, hypothermia. Resolved Reason for admission. Workup is unremarkable. Vitals are improved significantly.   # Bilateral lower extremity edema. Improved somewhat after receiving lasix 40 mg on 10/05/2022.  5/11 again noticed worsening of bilateral lower extremity edema, right greater than left Follow venous duplex to rule out DVT S/p Lasix 40 mg p.o. BID x 2 doses, developed AKI due to dehydration, creatinine 1.44 slightly elevated so discontinued Lasix Keep legs elevated, apply ice wraps Patient is not cooperative for DVT study, ordered Ativan for as needed use and advised RN to try again to get DVT study done. 5/13 patient was given Ativan before venous duplex, negative for DVT. Compression stockings orders placed 5/14 started Lasix 20 mg p.o. daily, started low-dose due to risk of AKI  # Restless leg. Continue Requip at nighttime.   # Anxiety and depression. Appreciate psychiatry assistance. Continue current regimen.   # Vitamin B12 deficiency. On oral supplements.   # BPH. Continuing Flomax.  Body mass index is 24.74 kg/m.  Interventions:       Diet: Regular diet DVT Prophylaxis: Subcutaneous Lovenox   Advance goals of care discussion: Full code  Family Communication: family was not present at bedside, at the time of interview.  The pt provided permission to discuss medical plan with the family. Opportunity was given to ask question and all  questions were answered satisfactorily.   Disposition:  Pt is from  [facility] nursing facility in dementia unit, admitted with AMS, behavioral issue. still has behavioral  issue, pending determination of guardian and placement, which precludes a safe discharge. Discharge to dementia unit, when bed will be available, awaiting for guardianship.  TOC is following for placement  Subjective: Patient was sleepy in the morning, as per caretaker patient was agitated and aggressive last night and did not sleep well.  Patient was unable to offer any complaints, resting comfortably.  Patient's room was cold and freezing so RN was advised to adjust the temperature.   Physical Exam: General: NAD, laying comfortably Appear in no distress, affect depressed Eyes: Closed, sleepy ENT: Oral Mucosa Clear, moist  Neck: no JVD,  Cardiovascular: S1 and S2 Present, no Murmur,  Respiratory: good respiratory effort, Bilateral Air entry equal and Decreased, no Crackles, no wheezes Abdomen: Bowel Sound present, Soft and no tenderness,  Skin: no rashes Extremities: 4+  Pedal edema R>L,  no calf tenderness Neurologic: without any new focal findings Gait not checked due to patient safety concerns  Vitals:   10/14/22 0724 10/14/22 1654 10/15/22 0619 10/15/22 0814  BP: 112/65 (!) 110/95 120/89 (!) 117/58  Pulse: 78 74 73 77  Resp: 16 16 18 16   Temp: 98.1 F (36.7 C)  98.6 F (37 C) 98.2 F (36.8 C)  TempSrc: Oral   Oral  SpO2: 96% 96%  99%  Weight:      Height:       No intake or output data in the 24 hours ending 10/15/22 1347   Filed Weights   08/08/22 1205 08/14/22 0038 09/07/22 0057  Weight: 81 kg 78.2 kg 78.2 kg    Data Reviewed: I have personally reviewed and interpreted daily labs, tele strips, imagings as discussed above. I reviewed all nursing notes, pharmacy notes, vitals, pertinent old records I have discussed plan of care as described above with RN and patient/family.  CBC: Recent Labs  Lab 10/13/22 0511  WBC 4.9  HGB 11.6*  HCT 35.9*  MCV 95.0  PLT 260    Basic Metabolic Panel: Recent Labs  Lab 10/13/22 0511 10/14/22 0530 10/15/22 0631  NA  139 140 137  K 4.1 4.0 3.6  CL 104 104 106  CO2 29 29 27   GLUCOSE 105* 101* 94  BUN 30* 31* 32*  CREATININE 1.44* 1.37* 1.23  CALCIUM 8.8* 8.9 8.5*  MG 2.3  --   --   PHOS 4.7*  --   --      Studies: No results found.  Scheduled Meds:  bisacodyl  10 mg Rectal Once   vitamin B-12  1,000 mcg Oral Daily   divalproex  500 mg Oral Q12H   donepezil  5 mg Oral Daily   enoxaparin (LOVENOX) injection  40 mg Subcutaneous Q24H   feeding supplement  237 mL Oral BID BM   furosemide  20 mg Oral Daily   loratadine  10 mg Oral Daily   melatonin  5 mg Oral QHS   polyethylene glycol  17 g Oral BID   pravastatin  40 mg Oral QHS   QUEtiapine  100 mg Oral TID   rOPINIRole  0.5 mg Oral QHS   tamsulosin  0.4 mg Oral QPC supper   traZODone  100 mg Oral QHS   Vitamin D (Ergocalciferol)  50,000 Units Oral Q7 days   Continuous Infusions: PRN Meds: acetaminophen, bisacodyl, diphenhydrAMINE, guaiFENesin-dextromethorphan  Time spent: 35 minutes  Author: Gillis Santa. MD [facility] 10/15/2022 1:47 PM  To reach On-call, see care teams to locate the attending and reach out to them via [email or web address]. If 7PM-7AM, please contact night-coverage If you still have difficulty reaching the attending provider, please page the [facility] (Director on Call) for [facility] on amion for assistance.

## 2022-10-15 NOTE — TOC Progression Note (Signed)
Transition of Care [facility]) - Progression Note    Patient Details  Name: Terry Sloan MRN: [id number] Date of Birth: 1940/10/16  Transition of Care [facility]) CM/SW Contact  Darleene Cleaver, [place] Phone Number: 10/15/2022, 9:30 PM  Clinical Narrative:     CSW contacted [address], [facility], [facility], and [facility] to see if they have beds available.  CSW left messages awaiting call backs.  Expected Discharge Plan:  (TBD) Barriers to Discharge: Continued Medical Work up  Expected Discharge Plan and Services    [facility].                                           Social Determinants of Health (SDOH) Interventions SDOH Screenings   Food Insecurity: Patient Unable To Answer (09/07/2022)  Housing: Low Risk  (09/07/2022)  Transportation Needs: Patient Unable To Answer (09/07/2022)  Utilities: Not At Risk (07/23/2022)  Alcohol Screen: Low Risk  (07/23/2022)  Depression (PHQ2-9): Low Risk  (07/23/2022)  Recent Concern: Depression (PHQ2-9) - Medium Risk (06/17/2022)  Financial Resource Strain: Low Risk  (07/23/2022)  Physical Activity: Insufficiently Active (07/23/2022)  Social Connections: Socially Isolated (07/23/2022)  Stress: No Stress Concern Present (07/23/2022)  Tobacco Use: Medium Risk (09/11/2022)    Readmission Risk Interventions     No data to display

## 2022-10-16 ENCOUNTER — Inpatient Hospital Stay: Payer: Medicare HMO

## 2022-10-16 LAB — BASIC METABOLIC PANEL
Anion gap: 8 (ref 5–15)
BUN: 22 mg/dL (ref 8–23)
CO2: 26 mmol/L (ref 22–32)
Calcium: 8.8 mg/dL — ABNORMAL LOW (ref 8.9–10.3)
Chloride: 106 mmol/L (ref 98–111)
Creatinine, Ser: 1.13 mg/dL (ref 0.61–1.24)
GFR, Estimated: >60 mL/min (ref >60)
Glucose, Bld: 91 mg/dL (ref 70–99)
Potassium: 4 mmol/L (ref 3.5–5.1)
Sodium: 140 mmol/L (ref 135–145)

## 2022-10-16 LAB — CBC
HCT: 36.1 % — ABNORMAL LOW (ref 39.0–52.0)
Hemoglobin: 11.6 g/dL — ABNORMAL LOW (ref 13.0–17.0)
MCH: 30.4 pg (ref 26.0–34.0)
MCHC: 32.1 g/dL (ref 30.0–36.0)
MCV: 94.8 fL (ref 80.0–100.0)
Platelets: 224 10*3/uL (ref 150–400)
RBC: 3.81 MIL/uL — ABNORMAL LOW (ref 4.22–5.81)
RDW: 12.1 % (ref 11.5–15.5)
WBC: 4.5 10*3/uL (ref 4.0–10.5)
nRBC: 0 % (ref 0.0–0.2)

## 2022-10-16 MED ORDER — BISACODYL 5 MG PO TBEC
10.0000 mg | DELAYED_RELEASE_TABLET | Freq: Once | ORAL | Status: AC
  Administered 2022-10-16: 10 mg via ORAL
  Filled 2022-10-16: qty 2

## 2022-10-16 NOTE — Progress Notes (Signed)
[facility] Progress Note  Patient: Terry Sloan    NWG:[id number]  DOA: 08/08/2022     Date of Service: the patient was seen and examined on 10/16/2022  Chief Complaint  Patient presents with   Psychiatric Evaluation   Brief hospital course: [name] is 82 year old male is a 82 year old male with history of dementia, behavioral disturbance, hyperlipidemia, BPH, insomnia, depression, anxiety, who presents to emergency department from [facility] rehab dementia department on 08/08/2022 for chief concerns of aggression with aggressive outburst towards staff. Presented to [name] ED on 2/24 due to agitation.  Was in the ED until 3/1 as he required SNF placement.  Sent to [facility] and [name].  On 3/7 returned back to ER for confusion, combative with staff.  Was IVC in the ED.Behavioral health was consulted.  TOC was consulted for placement to SNF again. however, was declined by facilities due to need for as needed Haldol.   On 4/5 EDP requesting admission for hypotension, hypothermia and change in mentation.  Symptoms resolved by 4/6. Workup was unremarkable for any significant infectious etiology. Patient continues to be seen by psychiatry to help manage his agitation. During the hospitalization had parainfluenza viral infection on 4/27 treated conservatively. As of 5/2 has not required any as needed Haldol dose. Behavior appears to be well-controlled.  Not agitated.  Not combative.   Patient is pending determination of a guardian for the patient and placement    Assessment and Plan: Agitation, Dementia with behavioral disturbances. Appreciate psychiatry consultation. Patient is currently on Seroquel, Depakote, Aricept. Trazodone for insomnia also ordered.  Switch from as needed to scheduled. Currently has a sitter primarily for safety and redirection. Not a candidate for inpatient psych admission. No evidence of infectious etiology right now. If has recurrence  of agitation would recommend to increase Seroquel dose at nighttime to 200 mg. 5/9 due to insomnia added melatonin 5 mg p.o. nightly 5/11 still has insomnia, increase trazodone from 50 to 100 mg p.o. daily and one-time dose of Ambien ordered for induction of sleep  # Recent parainfluenza infection. Treated conservatively.  Diagnosed on 4/27.   # Hypotension, hypothermia. Resolved Reason for admission. Workup is unremarkable. Vitals are improved significantly.   # Bilateral lower extremity edema. Improved somewhat after receiving lasix 40 mg on 10/05/2022.  5/11 again noticed worsening of bilateral lower extremity edema, right greater than left Follow venous duplex to rule out DVT S/p Lasix 40 mg p.o. BID x 2 doses, developed AKI due to dehydration, creatinine 1.44 slightly elevated so discontinued Lasix Keep legs elevated, apply ice wraps Patient is not cooperative for DVT study, ordered Ativan for as needed use and advised RN to try again to get DVT study done. 5/13 patient was given Ativan before venous duplex, negative for DVT. Compression stockings orders placed 5/14 started Lasix 20 mg p.o. daily, started low-dose due to risk of AKI 5/15 creatinine 1.13, stable we will continue Lasix 20 mg p.o. daily  # Restless leg. Continue Requip at nighttime.   # Anxiety and depression. Appreciate psychiatry assistance. Continue current regimen.   # Vitamin B12 deficiency. On oral supplements.   # BPH. Continuing Flomax.  # Fecal incontinence secondary to constipation Continue MiraLAX twice daily 5/15 abdominal x-ray: No evidence of bowel obstruction. Moderate colonic stool burden    Body mass index is 24.74 kg/m.  Interventions:       Diet: Regular diet DVT Prophylaxis: Subcutaneous Lovenox   Advance goals of care  discussion: Full code  Family Communication: family was not present at bedside, at the time of interview.  The pt provided permission to discuss medical plan  with the family. Opportunity was given to ask question and all questions were answered satisfactorily.   Disposition:  Pt is from  [facility] facility in dementia unit, admitted with AMS, behavioral issue. still has behavioral issue, pending determination of guardian and placement, which precludes a safe discharge. Discharge to dementia unit, when bed will be available, awaiting for guardianship.  TOC is following for placement  Subjective: Patient was sleeping comfortably, as per caregiver patient was agitated in the morning but now resting comfortably, did not sleep well last night.  Still has edema in the lower extremities. As per caregiver patient is incontinent of urine and stool, having mushy stools.  No diarrhea. Bladder scan was done which ruled out urine retention, we will give Dulcolax as x-ray shows moderate stool burden.   Physical Exam: General: NAD, laying comfortably Appear in no distress, affect depressed Eyes: Closed, sleepy ENT: Oral Mucosa Clear, moist  Neck: no JVD,  Cardiovascular: S1 and S2 Present, no Murmur,  Respiratory: good respiratory effort, Bilateral Air entry equal and Decreased, no Crackles, no wheezes Abdomen: Bowel Sound present, Soft and no tenderness,  Skin: no rashes Extremities: 4+  Pedal edema R>L,  no calf tenderness Neurologic: without any new focal findings Gait not checked due to patient safety concerns  Vitals:   10/15/22 0814 10/15/22 1547 10/16/22 0317 10/16/22 0735  BP: (!) 117/58 129/67 (!) 125/59 117/69  Pulse: 77 83 83 84  Resp: 16 17 18 17   Temp: 98.2 F (36.8 C) 97.6 F (36.4 C) 98.1 F (36.7 C)   TempSrc: Oral  Oral   SpO2: 99% 98% 96% 99%  Weight:      Height:        Intake/Output Summary (Last 24 hours) at 10/16/2022 1349 Last data filed at 10/15/2022 1729 Gross per 24 hour  Intake --  Output 450 ml  Net -450 ml     Filed Weights   08/08/22 1205 08/14/22 0038 09/07/22 0057  Weight: 81 kg 78.2 kg 78.2 kg     Data Reviewed: I have personally reviewed and interpreted daily labs, tele strips, imagings as discussed above. I reviewed all nursing notes, pharmacy notes, vitals, pertinent old records I have discussed plan of care as described above with RN and patient/family.  CBC: Recent Labs  Lab 10/13/22 0511 10/16/22 0516  WBC 4.9 4.5  HGB 11.6* 11.6*  HCT 35.9* 36.1*  MCV 95.0 94.8  PLT 260 224    Basic Metabolic Panel: Recent Labs  Lab 10/13/22 0511 10/14/22 0530 10/15/22 0631 10/16/22 0516  NA 139 140 137 140  K 4.1 4.0 3.6 4.0  CL 104 104 106 106  CO2 29 29 27 26   GLUCOSE 105* 101* 94 91  BUN 30* 31* 32* 22  CREATININE 1.44* 1.37* 1.23 1.13  CALCIUM 8.8* 8.9 8.5* 8.8*  MG 2.3  --   --   --   PHOS 4.7*  --   --   --      Studies: DG Abd 1 View  Result Date: 10/16/2022 CLINICAL DATA:  [id number] Constipation [id number] EXAM: ABDOMEN - 1 VIEW COMPARISON:  None Available. FINDINGS: Nonobstructive bowel gas pattern. Moderate colonic stool burden. Calcification overlying the right iliac bone corresponds to calcifications seen on prior CT in October 2011. No radiopaque calculi overlie the kidneys. Degenerative changes of the spine.  IMPRESSION: No evidence of bowel obstruction.  Moderate colonic stool burden. Electronically Signed   By: Caprice Renshaw M.D.   On: 10/16/2022 13:07    Scheduled Meds:  bisacodyl  10 mg Rectal Once   vitamin B-12  1,000 mcg Oral Daily   divalproex  500 mg Oral Q12H   donepezil  5 mg Oral Daily   enoxaparin (LOVENOX) injection  40 mg Subcutaneous Q24H   feeding supplement  237 mL Oral BID BM   furosemide  20 mg Oral Daily   melatonin  5 mg Oral QHS   polyethylene glycol  17 g Oral BID   pravastatin  40 mg Oral QHS   QUEtiapine  100 mg Oral TID   rOPINIRole  0.5 mg Oral QHS   tamsulosin  0.4 mg Oral QPC supper   traZODone  100 mg Oral QHS   Vitamin D (Ergocalciferol)  50,000 Units Oral Q7 days   Continuous Infusions: PRN Meds: acetaminophen,  bisacodyl, diphenhydrAMINE, guaiFENesin-dextromethorphan, haloperidol **OR** haloperidol lactate  Time spent: 35 minutes  Author: Gillis Santa. MD [facility] 10/16/2022 1:49 PM  To reach On-call, see care teams to locate the attending and reach out to them via [email or web address]. If 7PM-7AM, please contact night-coverage If you still have difficulty reaching the attending provider, please page the [facility] (Director on Call) for [facility] on amion for assistance.

## 2022-10-17 LAB — BASIC METABOLIC PANEL
Anion gap: 7 (ref 5–15)
BUN: 30 mg/dL — ABNORMAL HIGH (ref 8–23)
CO2: 27 mmol/L (ref 22–32)
Calcium: 8.9 mg/dL (ref 8.9–10.3)
Chloride: 106 mmol/L (ref 98–111)
Creatinine, Ser: 1.46 mg/dL — ABNORMAL HIGH (ref 0.61–1.24)
GFR, Estimated: 47 mL/min — ABNORMAL LOW (ref >60)
Glucose, Bld: 92 mg/dL (ref 70–99)
Potassium: 4.2 mmol/L (ref 3.5–5.1)
Sodium: 140 mmol/L (ref 135–145)

## 2022-10-17 LAB — URINE CULTURE

## 2022-10-17 MED ORDER — FUROSEMIDE 20 MG PO TABS: 20.0000 mg | ORAL_TABLET | ORAL | Status: DC

## 2022-10-17 NOTE — Progress Notes (Signed)
Mobility Specialist - Progress Note   10/17/22 0932  Mobility  Activity Ambulated with assistance in hallway  Level of Assistance Standby assist, set-up cues, supervision of patient - no hands on  Assistive Device Front wheel walker  Distance Ambulated (ft) 140 ft  Activity Response Tolerated well  Mobility Referral Yes  $Mobility charge 1 Mobility  Mobility Specialist Start Time (ACUTE ONLY) 0901  Mobility Specialist Stop Time (ACUTE ONLY) 0909  Mobility Specialist Time Calculation (min) (ACUTE ONLY) 8 min   Pt OOB in hallway upon arrival. Pt ambulates in hallway SBA with chair follow. Pt needs VC for safety awareness throughout. Pt requests to sit and is left in [facility] with NT.   Terrilyn Saver  Mobility Specialist  10/17/22 9:34 AM

## 2022-10-17 NOTE — Progress Notes (Signed)
[facility] Progress Note  Patient: Terry Sloan    MWU:[id number]  DOA: 08/08/2022     Date of Service: the patient was seen and examined on 10/17/2022  Chief Complaint  Patient presents with   Psychiatric Evaluation   Brief hospital course: [name] is 82 year old male is a 82 year old male with history of dementia, behavioral disturbance, hyperlipidemia, BPH, insomnia, depression, anxiety, who presents to emergency department from [facility] rehab dementia department on 08/08/2022 for chief concerns of aggression with aggressive outburst towards staff. Presented to [name] ED on 2/24 due to agitation.  Was in the ED until 3/1 as he required SNF placement.  Sent to [place] and [name].  On 3/7 returned back to ER for confusion, combative with staff.  Was IVC in the ED.Behavioral health was consulted.  TOC was consulted for placement to SNF again. however, was declined by facilities due to need for as needed Haldol.   On 4/5 EDP requesting admission for hypotension, hypothermia and change in mentation.  Symptoms resolved by 4/6. Workup was unremarkable for any significant infectious etiology. Patient continues to be seen by psychiatry to help manage his agitation. During the hospitalization had parainfluenza viral infection on 4/27 treated conservatively. As of 5/2 has not required any as needed Haldol dose. Behavior appears to be well-controlled.  Not agitated.  Not combative.   Patient is pending determination of a guardian for the patient and placement    Assessment and Plan: Agitation, Dementia with behavioral disturbances. Appreciate psychiatry consultation. Patient is currently on Seroquel, Depakote, Aricept. Trazodone for insomnia also ordered.  Switch from as needed to scheduled. Currently has a sitter primarily for safety and redirection. Not a candidate for inpatient psych admission. No evidence of infectious etiology right now. If has recurrence  of agitation would recommend to increase Seroquel dose at nighttime to 200 mg. 5/9 due to insomnia added melatonin 5 mg p.o. nightly 5/11 still has insomnia, increase trazodone from 50 to 100 mg p.o. daily and one-time dose of Ambien ordered for induction of sleep  # Recent parainfluenza infection. Treated conservatively.  Diagnosed on 4/27.   # Hypotension, hypothermia. Resolved Reason for admission. Workup is unremarkable. Vitals are improved significantly.   # Bilateral lower extremity edema. Improved somewhat after receiving lasix 40 mg on 10/05/2022.  5/11 again noticed worsening of bilateral lower extremity edema, right greater than left Follow venous duplex to rule out DVT S/p Lasix 40 mg p.o. BID x 2 doses, developed AKI due to dehydration, creatinine 1.44 slightly elevated so discontinued Lasix Keep legs elevated, apply ice wraps Patient is not cooperative for DVT study, ordered Ativan for as needed use and advised RN to try again to get DVT study done. 5/13 patient was given Ativan before venous duplex, negative for DVT. Compression stockings orders placed 5/14 started Lasix 20 mg p.o. daily, started low-dose due to risk of AKI 5/15 creatinine 1.13, stable we will continue Lasix 20 mg p.o. daily 5/16 creatinine 1.46 slightly elevated, changed to Lasix 20 mg to every other day, edema is improving as well. To monitor renal functions and titrate dose accordingly.  # Restless leg. Continue Requip at nighttime.   # Anxiety and depression. Appreciate psychiatry assistance. Continue current regimen.   # Vitamin B12 deficiency. On oral supplements.   # BPH. Continuing Flomax.  # Fecal incontinence secondary to constipation Continue MiraLAX twice daily 5/15 abdominal x-ray: No evidence of bowel obstruction. Moderate colonic stool burden  Body mass index is 24.74 kg/m.  Interventions:       Diet: Regular diet DVT Prophylaxis: Subcutaneous Lovenox   Advance goals  of care discussion: Full code  Family Communication: family was not present at bedside, at the time of interview.  The pt provided permission to discuss medical plan with the family. Opportunity was given to ask question and all questions were answered satisfactorily.   Disposition:  Pt is from  [facility] nursing facility in dementia unit, admitted with AMS, behavioral issue. still has behavioral issue, pending determination of guardian and placement, which precludes a safe discharge. Discharge to dementia unit, when bed will be available, awaiting for guardianship.  TOC is following for placement  Subjective: No significant events overnight, as per caregiver patient had good sleep last night, no agitation today.  On exam lower extremity edema is improving.  Patient did mention that he had a bowel movement yesterday. Patient denies any dysuria.  Patient was very cooperative and sitting comfortably on the recliner.   Physical Exam: General: NAD, laying comfortably Appear in no distress, affect depressed Eyes: Closed, sleepy ENT: Oral Mucosa Clear, moist  Neck: no JVD,  Cardiovascular: S1 and S2 Present, no Murmur,  Respiratory: good respiratory effort, Bilateral Air entry equal and Decreased, no Crackles, no wheezes Abdomen: Bowel Sound present, Soft and no tenderness,  Skin: no rashes Extremities: 1-2+  Pedal edema,  no calf tenderness.  Edema gradually improving Neurologic: without any new focal findings Gait not checked due to patient safety concerns  Vitals:   10/15/22 1547 10/16/22 0317 10/16/22 0735 10/16/22 2021  BP: 129/67 (!) 125/59 117/69 91/80  Pulse: 83 83 84 95  Resp: 17 18 17 18   Temp: 97.6 F (36.4 C) 98.1 F (36.7 C)  98.2 F (36.8 C)  TempSrc:  Oral    SpO2: 98% 96% 99% 98%  Weight:      Height:       No intake or output data in the 24 hours ending 10/17/22 1352    Filed Weights   08/08/22 1205 08/14/22 0038 09/07/22 0057  Weight: 81 kg 78.2 kg 78.2 kg     Data Reviewed: I have personally reviewed and interpreted daily labs, tele strips, imagings as discussed above. I reviewed all nursing notes, pharmacy notes, vitals, pertinent old records I have discussed plan of care as described above with RN and patient/family.  CBC: Recent Labs  Lab 10/13/22 0511 10/16/22 0516  WBC 4.9 4.5  HGB 11.6* 11.6*  HCT 35.9* 36.1*  MCV 95.0 94.8  PLT 260 224    Basic Metabolic Panel: Recent Labs  Lab 10/13/22 0511 10/14/22 0530 10/15/22 0631 10/16/22 0516 10/17/22 0600  NA 139 140 137 140 140  K 4.1 4.0 3.6 4.0 4.2  CL 104 104 106 106 106  CO2 29 29 27 26 27   GLUCOSE 105* 101* 94 91 92  BUN 30* 31* 32* 22 30*  CREATININE 1.44* 1.37* 1.23 1.13 1.46*  CALCIUM 8.8* 8.9 8.5* 8.8* 8.9  MG 2.3  --   --   --   --   PHOS 4.7*  --   --   --   --      Studies: No results found.  Scheduled Meds:  vitamin B-12  1,000 mcg Oral Daily   divalproex  500 mg Oral Q12H   donepezil  5 mg Oral Daily   enoxaparin (LOVENOX) injection  40 mg Subcutaneous Q24H   feeding supplement  237 mL Oral BID  BM   furosemide  20 mg Oral Daily   melatonin  5 mg Oral QHS   polyethylene glycol  17 g Oral BID   pravastatin  40 mg Oral QHS   QUEtiapine  100 mg Oral TID   rOPINIRole  0.5 mg Oral QHS   tamsulosin  0.4 mg Oral QPC supper   traZODone  100 mg Oral QHS   Vitamin D (Ergocalciferol)  50,000 Units Oral Q7 days   Continuous Infusions: PRN Meds: acetaminophen, bisacodyl, diphenhydrAMINE, guaiFENesin-dextromethorphan, haloperidol **OR** haloperidol lactate  Time spent: 35 minutes  Author: Gillis Santa. MD [facility] 10/17/2022 1:52 PM  To reach On-call, see care teams to locate the attending and reach out to them via [email or web address]. If 7PM-7AM, please contact night-coverage If you still have difficulty reaching the attending provider, please page the [facility] (Director on Call) for [facility] on amion for assistance.

## 2022-10-18 LAB — CBC
HCT: 34 % — ABNORMAL LOW (ref 39.0–52.0)
Hemoglobin: 11.1 g/dL — ABNORMAL LOW (ref 13.0–17.0)
MCH: 31.1 pg (ref 26.0–34.0)
MCHC: 32.6 g/dL (ref 30.0–36.0)
MCV: 95.2 fL (ref 80.0–100.0)
Platelets: 227 10*3/uL (ref 150–400)
RBC: 3.57 MIL/uL — ABNORMAL LOW (ref 4.22–5.81)
RDW: 12.4 % (ref 11.5–15.5)
WBC: 4.7 10*3/uL (ref 4.0–10.5)
nRBC: 0 % (ref 0.0–0.2)

## 2022-10-18 LAB — BASIC METABOLIC PANEL
Anion gap: 7 (ref 5–15)
BUN: 33 mg/dL — ABNORMAL HIGH (ref 8–23)
CO2: 28 mmol/L (ref 22–32)
Calcium: 8.6 mg/dL — ABNORMAL LOW (ref 8.9–10.3)
Chloride: 104 mmol/L (ref 98–111)
Creatinine, Ser: 1.35 mg/dL — ABNORMAL HIGH (ref 0.61–1.24)
GFR, Estimated: 52 mL/min — ABNORMAL LOW (ref >60)
Glucose, Bld: 85 mg/dL (ref 70–99)
Potassium: 3.9 mmol/L (ref 3.5–5.1)
Sodium: 139 mmol/L (ref 135–145)

## 2022-10-18 NOTE — Progress Notes (Signed)
Progress Note   Patient: Terry Sloan ZOX:[id number] DOB: Oct 03, 1940 DOA: 08/08/2022     42 DOS: the patient was seen and examined on 10/18/2022    Subjective:  Patient seen and examined at bedside this morning He was walking with therapist Agitation reportedly improved Denies chest pain nausea vomiting or abdominal pain   Brief hospital course: [name] is 82 year old male is a 82 year old male with history of dementia, behavioral disturbance, hyperlipidemia, BPH, insomnia, depression, anxiety, who presents to emergency department from [facility] rehab dementia department on 08/08/2022 for chief concerns of aggression with aggressive outburst towards staff. [facility]ed to [name] on 2/24 due to agitation.  Was in the ED until 3/1 as he required SNF placement.  Sent to [place] [facility]hab and [name].  On 3/7 returned back to ER for confusion, combative with staff.  Was IVC in the ED.Behavioral health was consulted.  TOC was consulted for placement to SNF again. however, was declined by facilities due to need for as needed Haldol.   On 4/5 EDP requesting admission for hypotension, hypothermia and change in mentation.  Symptoms resolved by 4/6. Workup was unremarkable for any significant infectious etiology. Patient continues to be seen by psychiatry to help manage his agitation. During the hospitalization had parainfluenza viral infection on 4/27 treated conservatively. As of 5/2 has not required any as needed Haldol dose. Behavior appears to be well-controlled.  Not agitated.  Not combative.   Patient is pending determination of a guardian for the patient and placement       Assessment and Plan: Agitation, Dementia with behavioral disturbances. Appreciate psychiatry consultation. Patient is currently on Seroquel, Depakote, Aricept. Trazodone for insomnia also ordered.  Switch from as needed to scheduled. Currently has a sitter primarily for safety and redirection. Not a  candidate for inpatient psych admission. No evidence of infectious etiology right now. If has recurrence of agitation would recommend to increase Seroquel dose at nighttime to 200 mg. 5/9 due to insomnia added melatonin 5 mg p.o. nightly Because of insomnia trazodone was increased from 50 to 100 mg p.o. daily and one-time dose of Ambien ordered for induction of sleep   # Recent parainfluenza infection. Treated conservatively.  Diagnosed on 4/27.   # Hypotension, hypothermia. Resolved Reason for admission. Workup is unremarkable. Vitals are improved significantly.   # Bilateral lower extremity edema. Improved somewhat after receiving lasix 40 mg on 10/05/2022.  5/11 again noticed worsening of bilateral lower extremity edema, right greater than left S/p Lasix 40 mg p.o. BID x 2 doses, developed AKI due to dehydration, creatinine 1.44 slightly elevated so discontinued Lasix Keep legs elevated, apply ice wraps 5/13 patient was given Ativan before venous duplex, negative for DVT. Compression stockings orders placed Patient received from Lasix with improvement lower extremity edema Lasix currently discontinued on account of worsening renal function To monitor renal functions and titrate dose accordingly.   # Restless leg. Continue Requip at nighttime.   # Anxiety and depression. Appreciate psychiatry assistance. Continue current regimen.   # Vitamin B12 deficiency. On oral supplements.   # BPH. Continuing Flomax.   # Fecal incontinence secondary to constipation Continue MiraLAX twice daily 5/15 abdominal x-ray: No evidence of bowel obstruction. Moderate colonic stool burden      Body mass index is 24.74 kg/m.  Interventions:        Diet: Regular diet DVT Prophylaxis: Subcutaneous Lovenox    Advance goals of care discussion: Full code   Family Communication:  family was not present at bedside, at the time of interview.   Disposition:  Pt is from  [place] nursing  facility in dementia unit, still has behavioral issue, pending determination of guardian and placement, which precludes a safe discharge. Discharge to dementia unit, when bed will be available, awaiting for guardianship.  TOC is following for placement      Physical Exam: General: NAD, laying comfortably Appear in no distress, affect depressed Eyes: Closed, sleepy ENT: Oral Mucosa Clear, moist  Neck: no JVD,  Cardiovascular: S1 and S2 Present, no Murmur,  Respiratory: good respiratory effort, Bilateral Air entry equal  Abdomen: Bowel Sound present, Soft and no tenderness,  Skin: no rashes Extremities: 1-2+  Pedal edema,  no calf tenderness.  Edema improving Neurologic: without any new focal findings Gait not checked due to patient safety concerns    Physical Exam: Vitals:   10/16/22 0317 10/16/22 0735 10/16/22 2021 10/17/22 1848  BP: (!) 125/59 117/69 91/80 114/68  Pulse: 83 84 95 89  Resp: 18 17 18 18   Temp: 98.1 F (36.7 C)  98.2 F (36.8 C) 97.9 F (36.6 C)  TempSrc: Oral   Oral  SpO2: 96% 99% 98% 100%  Weight:      Height:        Data Reviewed: I have personally reviewed patient's laboratory results showing sodium 139 potassium 3.9 creatinine 1.35   Time spent: 45 minutes  Author: Loyce Dys, MD 10/18/2022 5:32 PM  For on call review [email or web address].

## 2022-10-18 NOTE — Progress Notes (Signed)
Mobility Specialist - Progress Note   10/18/22 1600  Mobility  Activity Ambulated with assistance in hallway  Level of Assistance Minimal assist, patient does 75% or more  Assistive Device Front wheel walker  Distance Ambulated (ft) 240 ft  Activity Response Tolerated well  $Mobility charge 1 Mobility     Pt ambulating in room upon arrival, continued ambulation into hallway. Directional cues. Cues to maintain B hand placement onto RW and keep RW on ground. Mild LOB at pt lifts RW onto 2 wheels during turn, corrected by minA. Pt returned to recliner with sitter present.    Filiberto Pinks Mobility Specialist 10/18/22, 4:40 PM

## 2022-10-19 LAB — CBC WITH DIFFERENTIAL/PLATELET
Abs Immature Granulocytes: 0 10*3/uL (ref 0.00–0.07)
Basophils Absolute: 0 10*3/uL (ref 0.0–0.1)
Basophils Relative: 1 %
Eosinophils Absolute: 0.2 10*3/uL (ref 0.0–0.5)
Eosinophils Relative: 4 %
HCT: 32.7 % — ABNORMAL LOW (ref 39.0–52.0)
Hemoglobin: 10.5 g/dL — ABNORMAL LOW (ref 13.0–17.0)
Immature Granulocytes: 0 %
Lymphocytes Relative: 32 %
Lymphs Abs: 1.2 10*3/uL (ref 0.7–4.0)
MCH: 30.3 pg (ref 26.0–34.0)
MCHC: 32.1 g/dL (ref 30.0–36.0)
MCV: 94.5 fL (ref 80.0–100.0)
Monocytes Absolute: 0.7 10*3/uL (ref 0.1–1.0)
Monocytes Relative: 18 %
Neutro Abs: 1.7 10*3/uL (ref 1.7–7.7)
Neutrophils Relative %: 45 %
Platelets: 219 10*3/uL (ref 150–400)
RBC: 3.46 MIL/uL — ABNORMAL LOW (ref 4.22–5.81)
RDW: 12.3 % (ref 11.5–15.5)
WBC: 3.7 10*3/uL — ABNORMAL LOW (ref 4.0–10.5)
nRBC: 0 % (ref 0.0–0.2)

## 2022-10-19 LAB — URINE CULTURE

## 2022-10-19 LAB — BASIC METABOLIC PANEL
Anion gap: 5 (ref 5–15)
BUN: 28 mg/dL — ABNORMAL HIGH (ref 8–23)
CO2: 27 mmol/L (ref 22–32)
Calcium: 8.6 mg/dL — ABNORMAL LOW (ref 8.9–10.3)
Chloride: 108 mmol/L (ref 98–111)
Creatinine, Ser: 1.18 mg/dL (ref 0.61–1.24)
GFR, Estimated: >60 mL/min (ref >60)
Glucose, Bld: 88 mg/dL (ref 70–99)
Potassium: 3.9 mmol/L (ref 3.5–5.1)
Sodium: 140 mmol/L (ref 135–145)

## 2022-10-19 NOTE — Progress Notes (Signed)
Mobility Specialist - Progress Note   10/19/22 1237  Mobility  Activity Ambulated with assistance in hallway  Level of Assistance Standby assist, set-up cues, supervision of patient - no hands on  Assistive Device Front wheel walker  Distance Ambulated (ft) 1000 ft  Activity Response Tolerated well  Mobility Referral Yes  $Mobility charge 1 Mobility  Mobility Specialist Start Time (ACUTE ONLY) 1141  Mobility Specialist Stop Time (ACUTE ONLY) 1230  Mobility Specialist Time Calculation (min) (ACUTE ONLY) 49 min   Pt sitting in W/C on RA upon arrival. Pt wheeled outside to healing garden for leisure and outdoor ambulation. Pt ambulates outdoors on various terrain and up/down Vu SBA with no LOB noted. Pt needs some VC for redirection throughout. Pt returns to recliner in room with needs in reach and sitter present.   Terrilyn Saver  Mobility Specialist  10/19/22 12:38 PM

## 2022-10-19 NOTE — Progress Notes (Signed)
Progress Note   Patient: Terry Sloan VWU:[id number] DOB: [birth year] DOA: 08/08/2022     43 DOS: the patient was seen and examined on 10/19/2022    Subjective:  Patient seen and examined at bedside this morning Still continues to have one-to-one sitter in the room Agitation improving Denies chest pain nausea vomiting or abdominal pain     Brief hospital course: [name] is 82 year old male is a 82 year old male with history of dementia, behavioral disturbance, hyperlipidemia, BPH, insomnia, depression, anxiety, who presents to emergency department from [facility] rehab dementia department on 08/08/2022 for chief concerns of aggression with aggressive outburst towards staff. Presented to [name] ED on 2/24 due to agitation.  Was in the ED until 3/1 as he required SNF placement.  Sent to [facility] and [name].  On 3/7 returned back to ER for confusion, combative with staff.  Was IVC in the ED.Behavioral health was consulted.  TOC was consulted for placement to SNF again. however, was declined by facilities due to need for as needed Haldol.   On 4/5 EDP requesting admission for hypotension, hypothermia and change in mentation.  Symptoms resolved by 4/6. Workup was unremarkable for any significant infectious etiology. Patient continues to be seen by psychiatry to help manage his agitation. During the hospitalization had parainfluenza viral infection on 4/27 treated conservatively. As of 5/2 has not required any as needed Haldol dose. Behavior appears to be well-controlled.  Not agitated.  Not combative.   Patient is pending determination of a guardian for the patient and placement       Assessment and Plan: Agitation, Dementia with behavioral disturbances. Appreciate psychiatry consultation. Patient is currently on Seroquel, Depakote, Aricept. Trazodone for insomnia also ordered.  Switch from as needed to scheduled. Currently has a sitter primarily for safety and  redirection. Not a candidate for inpatient psych admission. No evidence of infectious etiology right now. If has recurrence of agitation would recommend to increase Seroquel dose at nighttime to 200 mg. 5/9 due to insomnia added melatonin 5 mg p.o. nightly Because of insomnia trazodone was increased from 50 to 100 mg p.o. daily and one-time dose of Ambien ordered for induction of sleep   # Recent parainfluenza infection. Treated conservatively.  Diagnosed on 4/27.   # Hypotension, hypothermia. Resolved Reason for admission. Workup is unremarkable. Vitals are improved significantly.   # Bilateral lower extremity edema. Improved somewhat after receiving lasix 40 mg on 10/05/2022.  5/11 again noticed worsening of bilateral lower extremity edema, right greater than left S/p Lasix 40 mg p.o. BID x 2 doses, developed AKI due to dehydration, creatinine 1.44 slightly elevated so discontinued Lasix Keep legs elevated, apply ice wraps 5/13 patient was given Ativan before venous duplex, negative for DVT. Compression stockings orders placed Patient received from Lasix with improvement lower extremity edema Lasix currently discontinued on account of worsening renal function To monitor renal functions and titrate dose accordingly.   # Restless leg. Continue Requip at nighttime.   # Anxiety and depression. Appreciate psychiatry assistance. Continue current regimen.   # Vitamin B12 deficiency. On oral supplements.   # BPH. Continuing Flomax.   # Fecal incontinence secondary to constipation Continue MiraLAX twice daily 5/15 abdominal x-ray: No evidence of bowel obstruction. Moderate colonic stool burden      Body mass index is 24.74 kg/m.  Interventions:        Diet: Regular diet DVT Prophylaxis: Subcutaneous Lovenox    Advance goals of care discussion: Full  code   Family Communication: family was not present at bedside, at the time of interview.    Disposition:  Pt is from   [facility] nursing facility in dementia unit, still has behavioral issue, pending determination of guardian and placement, which precludes a safe discharge. Discharge to dementia unit, when bed will be available, awaiting for guardianship.  TOC is following for placement       Physical Exam: General: NAD, laying comfortably Appear in no distress, affect depressed Eyes: Closed, sleepy ENT: Oral Mucosa Clear, moist  Neck: no JVD,  Cardiovascular: S1 and S2 Present, no Murmur,  Respiratory: good respiratory effort, Bilateral Air entry equal  Abdomen: Bowel Sound present, Soft and no tenderness,  Skin: no rashes Extremities: 1-2+  Pedal edema,  no calf tenderness.  Edema improving Neurologic: without any new focal findings Gait not checked due to patient safety concerns       Data Reviewed: I have personally reviewed patient's laboratory results showing sodium 140 potassium 3.9 creatinine 1.1   Time spent: 40 minutes  Vitals:   10/16/22 0735 10/16/22 2021 10/17/22 1848 10/18/22 1955  BP: 117/69 91/80 114/68 128/68  Pulse: 84 95 89 82  Resp: 17 18 18 16   Temp:  98.2 F (36.8 C) 97.9 F (36.6 C)   TempSrc:   Oral   SpO2: 99% 98% 100% 100%  Weight:      Height:         Author: Loyce Dys, MD 10/19/2022 11:34 AM  For on call review [email or web address].

## 2022-10-19 NOTE — Progress Notes (Signed)
Mobility Specialist - Progress Note   10/19/22 0913  Mobility  Activity Ambulated with assistance in hallway  Level of Assistance Contact guard assist, steadying assist  Assistive Device Front wheel walker  Distance Ambulated (ft) 170 ft  Activity Response Tolerated well  Mobility Referral Yes  $Mobility charge 1 Mobility  Mobility Specialist Start Time (ACUTE ONLY) 0854  Mobility Specialist Stop Time (ACUTE ONLY) 0911  Mobility Specialist Time Calculation (min) (ACUTE ONLY) 17 min   Pt sitting in recliner on RA upon arrival. Pt STS with ModA and ambulates 1 lap around NS CGA. Pt returns to recliner with needs in reach and sitter present.   Terrilyn Saver  Mobility Specialist  10/19/22 9:14 AM

## 2022-10-20 LAB — CBC WITH DIFFERENTIAL/PLATELET
Abs Immature Granulocytes: 0.02 10*3/uL (ref 0.00–0.07)
Basophils Absolute: 0 10*3/uL (ref 0.0–0.1)
Basophils Relative: 1 %
Eosinophils Absolute: 0.2 10*3/uL (ref 0.0–0.5)
Eosinophils Relative: 4 %
HCT: 35.1 % — ABNORMAL LOW (ref 39.0–52.0)
Hemoglobin: 11.2 g/dL — ABNORMAL LOW (ref 13.0–17.0)
Immature Granulocytes: 1 %
Lymphocytes Relative: 31 %
Lymphs Abs: 1.3 10*3/uL (ref 0.7–4.0)
MCH: 30.6 pg (ref 26.0–34.0)
MCHC: 31.9 g/dL (ref 30.0–36.0)
MCV: 95.9 fL (ref 80.0–100.0)
Monocytes Absolute: 0.7 10*3/uL (ref 0.1–1.0)
Monocytes Relative: 17 %
Neutro Abs: 2.1 10*3/uL (ref 1.7–7.7)
Neutrophils Relative %: 46 %
Platelets: 225 10*3/uL (ref 150–400)
RBC: 3.66 MIL/uL — ABNORMAL LOW (ref 4.22–5.81)
RDW: 12.4 % (ref 11.5–15.5)
WBC: 4.4 10*3/uL (ref 4.0–10.5)
nRBC: 0 % (ref 0.0–0.2)

## 2022-10-20 LAB — BASIC METABOLIC PANEL
Anion gap: 3 — ABNORMAL LOW (ref 5–15)
Anion gap: 8 (ref 5–15)
BUN: 33 mg/dL — ABNORMAL HIGH (ref 8–23)
BUN: 31 mg/dL — ABNORMAL HIGH (ref 8–23)
CO2: 30 mmol/L (ref 22–32)
CO2: 27 mmol/L (ref 22–32)
Calcium: 8.9 mg/dL (ref 8.9–10.3)
Calcium: 8.7 mg/dL — ABNORMAL LOW (ref 8.9–10.3)
Chloride: 106 mmol/L (ref 98–111)
Chloride: 103 mmol/L (ref 98–111)
Creatinine, Ser: 1.24 mg/dL (ref 0.61–1.24)
Creatinine, Ser: 1.29 mg/dL — ABNORMAL HIGH (ref 0.61–1.24)
GFR, Estimated: 58 mL/min — ABNORMAL LOW (ref >60)
GFR, Estimated: 55 mL/min — ABNORMAL LOW (ref >60)
Glucose, Bld: 90 mg/dL (ref 70–99)
Glucose, Bld: 107 mg/dL — ABNORMAL HIGH (ref 70–99)
Potassium: 4.3 mmol/L (ref 3.5–5.1)
Potassium: 4.4 mmol/L (ref 3.5–5.1)
Sodium: 139 mmol/L (ref 135–145)
Sodium: 138 mmol/L (ref 135–145)

## 2022-10-20 LAB — MAGNESIUM: Magnesium: 2 mg/dL (ref 1.7–2.4)

## 2022-10-20 MED ORDER — LIDOCAINE 5 % EX PTCH
1.0000 | MEDICATED_PATCH | CUTANEOUS | Status: DC
  Administered 2022-10-20 – 2022-10-27 (×8): 1 via TRANSDERMAL
  Filled 2022-10-20 (×18): qty 1

## 2022-10-20 MED ORDER — IPRATROPIUM-ALBUTEROL 0.5-2.5 (3) MG/3ML IN SOLN: 3.0000 mL | RESPIRATORY_TRACT | Status: DC | PRN

## 2022-10-20 MED ORDER — HYDRALAZINE HCL 20 MG/ML IJ SOLN: 10.0000 mg | INTRAMUSCULAR | Status: DC | PRN

## 2022-10-20 MED ORDER — TRAZODONE HCL 50 MG PO TABS
150.0000 mg | ORAL_TABLET | Freq: Every day | ORAL | Status: DC
  Administered 2022-10-20 – 2022-11-08 (×20): 150 mg via ORAL
  Filled 2022-10-20 (×20): qty 3

## 2022-10-20 MED ORDER — ZIPRASIDONE MESYLATE 20 MG IM SOLR
5.0000 mg | Freq: Once | INTRAMUSCULAR | Status: AC
  Administered 2022-10-20: 5 mg via INTRAMUSCULAR
  Filled 2022-10-20: qty 20

## 2022-10-20 MED ORDER — OLANZAPINE 10 MG IM SOLR
5.0000 mg | Freq: Once | INTRAMUSCULAR | Status: AC
  Administered 2022-10-20: 5 mg via INTRAMUSCULAR
  Filled 2022-10-20: qty 10

## 2022-10-20 MED ORDER — METOPROLOL TARTRATE 5 MG/5ML IV SOLN: 5.0000 mg | INTRAVENOUS | Status: DC | PRN

## 2022-10-20 NOTE — Progress Notes (Signed)
PROGRESS NOTE    Terry Sloan  ZOX:[id number] DOB: 08/27/1940 DOA: 08/08/2022 PCP: Pcp, No   Brief Narrative:  82 year old male with history of dementia, behavioral disturbance, hyperlipidemia, BPH, insomnia, depression, anxiety, who presents to emergency department from [facility] rehab dementia department on 08/08/2022 for chief concerns of aggression with aggressive outburst towards staff. Presented to [name] ED on 2/24 due to agitation.  Was in the ED until 3/1 as he required SNF placement.  Sent to [facility] and [name].  On 3/7 returned back to ER for confusion, combative with staff.  Was IVC in the ED.Behavioral health was consulted.  TOC was consulted for placement to SNF again. however, was declined by facilities due to need for as needed Haldol. Initially in the ED on 4/5 patient became hypotensive, hypothermic with change in mentation therefore admitted to the hospital.  Infectious workup was unremarkable.  Due to his agitation, psychiatry team was consulted. On 4/27 during hospitalization he was diagnosed with parainfluenza infection which was treated conservatively.  Currently pending safe disposition  Assessment & Plan:  Principal Problem:   Dementia with behavioral disturbance (HCC) Active Problems:   Mixed hyperlipidemia   History of TIA (transient ischemic attack)   Anxiety and depression   BPH (benign prostatic hyperplasia)   OAB (overactive bladder)   Altered mental status   Hypothermia   Restless leg   Vitamin B12 deficiency   Parainfluenza infection    Agitation, Dementia with behavioral disturbances. Appears to be calm this morning.  Currently on Depakote, Seroquel and Aricept.  Seen by psychiatry.  As needed Haldol. Bedtime trazodone  Parainfluenza infection. Treated conservatively.  Diagnosed on 4/27.   Hypotension, hypothermia. Resolved, no obvious infection   Bilateral lower extremity edema. Lower extremity duplex were negative.   Supportive care.  This is resolved for now   Restless leg. Continue Requip at nighttime.   Anxiety and depression. -On Depakote and Seroquel   Vitamin B12 and D deficiency. On oral supplements.    BPH. Continuing Flomax.   Fecal incontinence secondary to constipation Bowel regimen   Body mass index is 24.74 kg/m.  Interventions:        Diet: Regular diet DVT Prophylaxis: Subcutaneous Lovenox    Advance goals of care discussion: Full code   Family Communication: family was not present at bedside, at the time of interview.    Disposition:  Pt is from  [facility] nursing facility in dementia unit, still has behavioral issue, pending determination of guardian and placement, which precludes a safe discharge. Discharge to dementia unit, when bed will be available, awaiting for guardianship.  TOC is following for placement         Diet Orders (From admission, onward)     Start     Ordered   10/15/22 1308  Diet regular Fluid consistency: Thin  Diet effective now       Comments: Please add plastic utensils.  Question:  Fluid consistency:  Answer:  Thin   10/15/22 1307            Subjective:  Seen at bedside.  Ambulating with the help of nursing staff.  No complaints.  Examination:  General exam: Appears calm and comfortable  Respiratory system: Clear to auscultation. Respiratory effort normal. Cardiovascular system: S1 & S2 heard, RRR. No JVD, murmurs, rubs, gallops or clicks. No pedal edema. Gastrointestinal system: Abdomen is nondistended, soft and nontender. No organomegaly or masses felt. Normal bowel sounds heard. Central nervous system: Alert and oriented.  No focal neurological deficits. Extremities: Symmetric 5 x 5 power. Skin: No rashes, lesions or ulcers Psychiatry: Poor judgment and insight  Objective: Vitals:   10/18/22 1955 10/19/22 1935 10/20/22 0511 10/20/22 0737  BP: 128/68 124/74 108/70 126/69  Pulse: 82 88 77 78  Resp: 16 18 18 18    Temp:  98 F (36.7 C) 98 F (36.7 C) 98.7 F (37.1 C)  TempSrc:  Axillary    SpO2: 100% 97% 96% 99%  Weight:      Height:       No intake or output data in the 24 hours ending 10/20/22 1239 Filed Weights   08/08/22 1205 08/14/22 0038 09/07/22 0057  Weight: 81 kg 78.2 kg 78.2 kg    Scheduled Meds:  vitamin B-12  1,000 mcg Oral Daily   divalproex  500 mg Oral Q12H   donepezil  5 mg Oral Daily   enoxaparin (LOVENOX) injection  40 mg Subcutaneous Q24H   feeding supplement  237 mL Oral BID BM   melatonin  5 mg Oral QHS   polyethylene glycol  17 g Oral BID   pravastatin  40 mg Oral QHS   QUEtiapine  100 mg Oral TID   rOPINIRole  0.5 mg Oral QHS   tamsulosin  0.4 mg Oral QPC supper   traZODone  100 mg Oral QHS   Vitamin D (Ergocalciferol)  50,000 Units Oral Q7 days   Continuous Infusions:  Nutritional status     Body mass index is 24.74 kg/m.  Data Reviewed:   CBC: Recent Labs  Lab 10/16/22 0516 10/18/22 0530 10/19/22 0425 10/20/22 0503  WBC 4.5 4.7 3.7* 4.4  NEUTROABS  --   --  1.7 2.1  HGB 11.6* 11.1* 10.5* 11.2*  HCT 36.1* 34.0* 32.7* 35.1*  MCV 94.8 95.2 94.5 95.9  PLT 224 227 219 225   Basic Metabolic Panel: Recent Labs  Lab 10/17/22 0600 10/18/22 0530 10/19/22 0425 10/20/22 0503 10/20/22 0900  NA 140 139 140 139 138  K 4.2 3.9 3.9 4.3 4.4  CL 106 104 108 106 103  CO2 27 28 27 30 27   GLUCOSE 92 85 88 90 107*  BUN 30* 33* 28* 33* 31*  CREATININE 1.46* 1.35* 1.18 1.24 1.29*  CALCIUM 8.9 8.6* 8.6* 8.9 8.7*  MG  --   --   --   --  2.0   GFR: Estimated Creatinine Clearance: 44.8 mL/min (A) (by C-G formula based on SCr of 1.29 mg/dL (H)). Liver Function Tests: No results for input(s): "AST", "ALT", "ALKPHOS", "BILITOT", "PROT", "ALBUMIN" in the last 168 hours. No results for input(s): "LIPASE", "AMYLASE" in the last 168 hours. No results for input(s): "AMMONIA" in the last 168 hours. Coagulation Profile: No results for input(s): "INR",  "PROTIME" in the last 168 hours. Cardiac Enzymes: No results for input(s): "CKTOTAL", "CKMB", "CKMBINDEX", "TROPONINI" in the last 168 hours. BNP (last 3 results) No results for input(s): "PROBNP" in the last 8760 hours. HbA1C: No results for input(s): "HGBA1C" in the last 72 hours. CBG: No results for input(s): "GLUCAP" in the last 168 hours. Lipid Profile: No results for input(s): "CHOL", "HDL", "LDLCALC", "TRIG", "CHOLHDL", "LDLDIRECT" in the last 72 hours. Thyroid Function Tests: No results for input(s): "TSH", "T4TOTAL", "FREET4", "T3FREE", "THYROIDAB" in the last 72 hours. Anemia Panel: No results for input(s): "VITAMINB12", "FOLATE", "FERRITIN", "TIBC", "IRON", "RETICCTPCT" in the last 72 hours. Sepsis Labs: No results for input(s): "PROCALCITON", "LATICACIDVEN" in the last 168 hours.  Recent Results (from the past  240 hour(s))  Urine Culture     Status: Abnormal   Collection Time: 10/15/22  1:13 PM   Specimen: Urine, Random  Result Value Ref Range Status   Specimen Description   Final    URINE, RANDOM Performed at [facility], [address], [place], [place] [address]    Special Requests   Final    NONE Reflexed from [id number] Performed at [facility], [address] [place], [place] [address]    Culture (A)  Final    60,000 COLONIES/mL AEROCOCCUS URINAE Standardized susceptibility testing for this organism is not available. Performed at [facility], [address], [place], [place] [address]    Report Status 10/19/2022 FINAL  Final         Radiology Studies: No results found.         LOS: 44 days   Time spent= 35 mins    Horacio Werth Joline Maxcy, MD [facility]  If 7PM-7AM, please contact night-coverage  10/20/2022, 12:39 PM

## 2022-10-20 NOTE — Progress Notes (Signed)
Mobility Specialist - Progress Note   10/20/22 1356  Mobility  Activity Ambulated with assistance in hallway  Level of Assistance Standby assist, set-up cues, supervision of patient - no hands on  Assistive Device Front wheel walker  Distance Ambulated (ft) 480 ft  Activity Response Tolerated well  Mobility Referral Yes  $Mobility charge 1 Mobility  Mobility Specialist Start Time (ACUTE ONLY) 0135  Mobility Specialist Stop Time (ACUTE ONLY) 0154  Mobility Specialist Time Calculation (min) (ACUTE ONLY) 19 min   Pt OOB in doorway on RA upon arrival. Pt ambulates 3 laps around NS SBA. PT very difficult to redirect at end of session. Pt needs +2 ast to redirect to W/C. RN notified. Pt left with NT in W/C at end of session.   Terrilyn Saver  Mobility Specialist  10/20/22 1:58 PM

## 2022-10-20 NOTE — Progress Notes (Signed)
Mobility Specialist - Progress Note   10/20/22 0901  Mobility  Activity Ambulated with assistance in hallway  Level of Assistance Standby assist, set-up cues, supervision of patient - no hands on  Assistive Device Front wheel walker  Distance Ambulated (ft) 170 ft  Activity Response Tolerated well  Mobility Referral Yes  $Mobility charge 1 Mobility  Mobility Specialist Start Time (ACUTE ONLY) 0846  Mobility Specialist Stop Time (ACUTE ONLY) 0900  Mobility Specialist Time Calculation (min) (ACUTE ONLY) 14 min   Pt standing at table on RA upon arrival. Pt STS and ambulates in hallway SBA with no LOB noted. Pt needs VC and manual cues to keep walker straight. Pt returns to recliner with needs in reach and sitter present.   Terrilyn Saver  Mobility Specialist  10/20/22 9:03 AM

## 2022-10-20 NOTE — Progress Notes (Signed)
Patient was so aggressive and violent. He tried to get out of bed and hit the staff members.  He was yelling, try to biting the staff.MD notified and ing. GEODON given as order. It had little effects on the behavior.

## 2022-10-20 NOTE — Progress Notes (Signed)
Called into patient's room around 1830 by sitter and NT at bedside, both of which were trying to get patient into bed, as his legs were giving out. Patient attempting to hit and kick staff members. Patient assisted into bed by staff x3. Patient continues to be physically aggressive with staff members: hitting, kicking, head butting, attempting to bite, and scratching. Attempts to re-direct failed. Bilat mitts applied. PRN medication administered per orders after multiple conversations with attending.

## 2022-10-21 MED ORDER — OLANZAPINE 10 MG IM SOLR
5.0000 mg | Freq: Four times a day (QID) | INTRAMUSCULAR | Status: AC | PRN
  Administered 2022-10-26: 5 mg via INTRAMUSCULAR
  Filled 2022-10-21 (×2): qty 10

## 2022-10-21 NOTE — TOC Progression Note (Signed)
Transition of Care [facility]) - Progression Note    Patient Details  Name: Terry Sloan MRN: [id number] Date of Birth: 06/25/1940  Transition of Care [facility]) CM/SW Contact  Darleene Cleaver, [place] Phone Number: 10/21/2022, 5:54 PM  Clinical Narrative:     TOC still does not have any memory care facilities willing to accept patient.  Expected Discharge Plan:  (TBD) Barriers to Discharge: Continued Medical Work up  Expected Discharge Plan and Services                                               Social Determinants of Health (SDOH) Interventions SDOH Screenings   Food Insecurity: Patient Unable To Answer (09/07/2022)  Housing: Low Risk  (09/07/2022)  Transportation Needs: Patient Unable To Answer (09/07/2022)  Utilities: Not At Risk (07/23/2022)  Alcohol Screen: Low Risk  (07/23/2022)  Depression (PHQ2-9): Low Risk  (07/23/2022)  Recent Concern: Depression (PHQ2-9) - Medium Risk (06/17/2022)  Financial Resource Strain: Low Risk  (07/23/2022)  Physical Activity: Insufficiently Active (07/23/2022)  Social Connections: Socially Isolated (07/23/2022)  Stress: No Stress Concern Present (07/23/2022)  Tobacco Use: Medium Risk (09/11/2022)    Readmission Risk Interventions     No data to display

## 2022-10-21 NOTE — Progress Notes (Signed)
PROGRESS NOTE    Terry Sloan  WGN:[id number] DOB: [birth year] DOA: 08/08/2022 PCP: Pcp, No   Brief Narrative:  82 year old male with history of dementia, behavioral disturbance, hyperlipidemia, BPH, insomnia, depression, anxiety, who presents to emergency department from [facility] rehab dementia department on 08/08/2022 for chief concerns of aggression with aggressive outburst towards staff. Presented to [name] ED on 2/24 due to agitation.  Was in the ED until 3/1 as he required SNF placement.  Sent to [facility] and [name].  On 3/7 returned back to ER for confusion, combative with staff.  Was IVC in the ED.Behavioral health was consulted.  TOC was consulted for placement to SNF again. however, was declined by facilities due to need for as needed Haldol. Initially in the ED on 4/5 patient became hypotensive, hypothermic with change in mentation therefore admitted to the hospital.  Infectious workup was unremarkable.  Due to his agitation, psychiatry team was consulted. On 4/27 during hospitalization he was diagnosed with parainfluenza infection which was treated conservatively.  Currently pending safe disposition  Assessment & Plan:  Principal Problem:   Dementia with behavioral disturbance (HCC) Active Problems:   Mixed hyperlipidemia   History of TIA (transient ischemic attack)   Anxiety and depression   BPH (benign prostatic hyperplasia)   OAB (overactive bladder)   Altered mental status   Hypothermia   Restless leg   Vitamin B12 deficiency   Parainfluenza infection    Agitation, Dementia with behavioral disturbances. Appears to be calm this morning.  Currently on Depakote, Seroquel and Aricept.  Intermittent significant agitation.  On as needed Haldol and/OR Zyprexa Seen by psychiatry Increase bedtime trazodone 150 mg  Parainfluenza infection. Treated conservatively.  Diagnosed on 4/27.   Hypotension, hypothermia. Resolved, no obvious infection    Bilateral lower extremity edema. Lower extremity duplex were negative.  Supportive care.  This is resolved for now   Restless leg. Continue Requip at nighttime.   Anxiety and depression. -On Depakote and Seroquel   Vitamin B12 and D deficiency. On oral supplements.    BPH. Continuing Flomax.   Fecal incontinence secondary to constipation Bowel regimen   Body mass index is 24.74 kg/m.  Interventions:        Diet: Regular diet DVT Prophylaxis: Subcutaneous Lovenox    Advance goals of care discussion: Full code   Family Communication: Son and daughter at bedside   Disposition:  Pt is from  [facility] nursing facility in dementia unit, still has behavioral issue, pending determination of guardian and placement, which precludes a safe discharge. Discharge to dementia unit, when bed will be available, awaiting for guardianship.  TOC is following for placement         Diet Orders (From admission, onward)     Start     Ordered   10/15/22 1308  Diet regular Fluid consistency: Thin  Diet effective now       Comments: Please add plastic utensils.  Question:  Fluid consistency:  Answer:  Thin   10/15/22 1307            Subjective:  Sitting up in the recliner.  No complaints. Son and daughter are present at bedside  Examination:  Constitutional: Not in acute distress Respiratory: Clear to auscultation bilaterally Cardiovascular: Normal sinus rhythm, no rubs Abdomen: Nontender nondistended good bowel sounds Musculoskeletal: No edema noted Skin: No rashes seen Neurologic: CN 2-12 grossly intact.  And nonfocal Psychiatric: Poor udgment and insight. Alert and oriented x name only  Objective: Vitals:  10/20/22 0511 10/20/22 0737 10/20/22 1552 10/20/22 2036  BP: 108/70 126/69 124/66 (!) 152/92  Pulse: 77 78 84 (!) 107  Resp: 18 18 18 18   Temp: 98 F (36.7 C) 98.7 F (37.1 C) 97.6 F (36.4 C) 97.8 F (36.6 C)  TempSrc:    Oral  SpO2: 96% 99% 98% 100%   Weight:      Height:        Intake/Output Summary (Last 24 hours) at 10/21/2022 0834 Last data filed at 10/20/2022 1900 Gross per 24 hour  Intake --  Output 200 ml  Net -200 ml   Filed Weights   08/08/22 1205 08/14/22 0038 09/07/22 0057  Weight: 81 kg 78.2 kg 78.2 kg    Scheduled Meds:  vitamin B-12  1,000 mcg Oral Daily   divalproex  500 mg Oral Q12H   donepezil  5 mg Oral Daily   enoxaparin (LOVENOX) injection  40 mg Subcutaneous Q24H   feeding supplement  237 mL Oral BID BM   lidocaine  1 patch Transdermal Q24H   melatonin  5 mg Oral QHS   polyethylene glycol  17 g Oral BID   pravastatin  40 mg Oral QHS   QUEtiapine  100 mg Oral TID   rOPINIRole  0.5 mg Oral QHS   tamsulosin  0.4 mg Oral QPC supper   traZODone  150 mg Oral QHS   Vitamin D (Ergocalciferol)  50,000 Units Oral Q7 days   Continuous Infusions:  Nutritional status     Body mass index is 24.74 kg/m.  Data Reviewed:   CBC: Recent Labs  Lab 10/16/22 0516 10/18/22 0530 10/19/22 0425 10/20/22 0503  WBC 4.5 4.7 3.7* 4.4  NEUTROABS  --   --  1.7 2.1  HGB 11.6* 11.1* 10.5* 11.2*  HCT 36.1* 34.0* 32.7* 35.1*  MCV 94.8 95.2 94.5 95.9  PLT 224 227 219 225   Basic Metabolic Panel: Recent Labs  Lab 10/17/22 0600 10/18/22 0530 10/19/22 0425 10/20/22 0503 10/20/22 0900  NA 140 139 140 139 138  K 4.2 3.9 3.9 4.3 4.4  CL 106 104 108 106 103  CO2 27 28 27 30 27   GLUCOSE 92 85 88 90 107*  BUN 30* 33* 28* 33* 31*  CREATININE 1.46* 1.35* 1.18 1.24 1.29*  CALCIUM 8.9 8.6* 8.6* 8.9 8.7*  MG  --   --   --   --  2.0   GFR: Estimated Creatinine Clearance: 44.8 mL/min (A) (by C-G formula based on SCr of 1.29 mg/dL (H)). Liver Function Tests: No results for input(s): "AST", "ALT", "ALKPHOS", "BILITOT", "PROT", "ALBUMIN" in the last 168 hours. No results for input(s): "LIPASE", "AMYLASE" in the last 168 hours. No results for input(s): "AMMONIA" in the last 168 hours. Coagulation Profile: No results  for input(s): "INR", "PROTIME" in the last 168 hours. Cardiac Enzymes: No results for input(s): "CKTOTAL", "CKMB", "CKMBINDEX", "TROPONINI" in the last 168 hours. BNP (last 3 results) No results for input(s): "PROBNP" in the last 8760 hours. HbA1C: No results for input(s): "HGBA1C" in the last 72 hours. CBG: No results for input(s): "GLUCAP" in the last 168 hours. Lipid Profile: No results for input(s): "CHOL", "HDL", "LDLCALC", "TRIG", "CHOLHDL", "LDLDIRECT" in the last 72 hours. Thyroid Function Tests: No results for input(s): "TSH", "T4TOTAL", "FREET4", "T3FREE", "THYROIDAB" in the last 72 hours. Anemia Panel: No results for input(s): "VITAMINB12", "FOLATE", "FERRITIN", "TIBC", "IRON", "RETICCTPCT" in the last 72 hours. Sepsis Labs: No results for input(s): "PROCALCITON", "LATICACIDVEN" in the last 168  hours.  Recent Results (from the past 240 hour(s))  Urine Culture     Status: Abnormal   Collection Time: 10/15/22  1:13 PM   Specimen: Urine, Random  Result Value Ref Range Status   Specimen Description   Final    URINE, RANDOM Performed at [facility], [address], [place], [place] [address]    Special Requests   Final    NONE Reflexed from [id number] Performed at [facility], [address] [place], [place] [address]    Culture (A)  Final    60,000 COLONIES/mL AEROCOCCUS URINAE Standardized susceptibility testing for this organism is not available. Performed at [facility], [address], [place], [place] [address]    Report Status 10/19/2022 FINAL  Final         Radiology Studies: No results found.         LOS: 45 days   Time spent= 35 mins    Whittley Carandang Joline Maxcy, MD [facility]  If 7PM-7AM, please contact night-coverage  10/21/2022, 8:34 AM

## 2022-10-22 NOTE — Progress Notes (Signed)
PROGRESS NOTE    Terry Sloan  WUJ:[id number] DOB: 10-07-1940 DOA: 08/08/2022 PCP: Pcp, No   Brief Narrative:  82 year old male with history of dementia, behavioral disturbance, hyperlipidemia, BPH, insomnia, depression, anxiety, who presents to emergency department from [facility] rehab dementia department on 08/08/2022 for chief concerns of aggression with aggressive outburst towards staff. [facility]ed to [name] ED on 2/24 due to agitation.  Was in the ED until 3/1 as he required SNF placement.  Sent to [facility] and [name].  On 3/7 returned back to ER for confusion, combative with staff.  Was IVC in the ED.Behavioral health was consulted.  TOC was consulted for placement to SNF again. however, was declined by facilities due to need for as needed Haldol. Initially in the ED on 4/5 patient became hypotensive, hypothermic with change in mentation therefore admitted to the hospital.  Infectious workup was unremarkable.  Due to his agitation, psychiatry team was consulted. On 4/27 during hospitalization he was diagnosed with parainfluenza infection which was treated conservatively.  Currently pending safe disposition  Assessment & Plan:  Principal Problem:   Dementia with behavioral disturbance (HCC) Active Problems:   Mixed hyperlipidemia   History of TIA (transient ischemic attack)   Anxiety and depression   BPH (benign prostatic hyperplasia)   OAB (overactive bladder)   Altered mental status   Hypothermia   Restless leg   Vitamin B12 deficiency   Parainfluenza infection    Agitation, Dementia with behavioral disturbances. Appears to be calm this morning.  Currently on Depakote, Seroquel and Aricept.  Intermittent significant agitation.  PRN Haldol and/OR Zyprexa Seen by psychiatry Cont bedtime trazodone 150 mg  Parainfluenza infection. Treated conservatively.  Diagnosed on 4/27.   Hypotension, hypothermia. Resolved, no obvious infection   Bilateral lower  extremity edema. Lower extremity duplex were negative.  Supportive care.  This is resolved for now   Restless leg. Continue Requip at nighttime.   Anxiety and depression. -On Depakote and Seroquel   Vitamin B12 and D deficiency. On oral supplements.    BPH. Continuing Flomax.   Fecal incontinence secondary to constipation Bowel regimen   Body mass index is 24.74 kg/m.  Interventions:        Diet: Regular diet DVT Prophylaxis: Subcutaneous Lovenox    Advance goals of care discussion: Full code   Family Communication: Son and daughter at bedside; yesterday   Disposition:  Pt is from  [facility] nursing facility in dementia unit, still has behavioral issue, pending determination of guardian and placement, which precludes a safe discharge. Discharge to dementia unit, when bed will be available, awaiting for guardianship.  TOC is following for placement         Diet Orders (From admission, onward)     Start     Ordered   10/15/22 1308  Diet regular Fluid consistency: Thin  Diet effective now       Comments: Please add plastic utensils.  Question:  Fluid consistency:  Answer:  Thin   10/15/22 1307            Subjective: At side of the bed, no complaints   Examination: Constitutional: Not in acute distress Respiratory: Clear to auscultation bilaterally Cardiovascular: Normal sinus rhythm, no rubs Abdomen: Nontender nondistended good bowel sounds Musculoskeletal: No edema noted Skin: No rashes seen Neurologic: CN 2-12 grossly intact.  And nonfocal Psychiatric: Poor judgment and insight.  Alert to name only   Objective: Vitals:   10/21/22 1800 10/21/22 2132 10/22/22 0251 10/22/22 0715  BP:  (!) 97/58 (!) 93/56 107/66  Pulse:  73 77 74  Resp:  18 18 16   Temp:  98 F (36.7 C) 97.8 F (36.6 C) (!) 97.5 F (36.4 C)  TempSrc:  Axillary Axillary   SpO2: 97% 97% 98% 98%  Weight:      Height:        Intake/Output Summary (Last 24 hours) at  10/22/2022 0758 Last data filed at 10/22/2022 0300 Gross per 24 hour  Intake --  Output 100 ml  Net -100 ml   Filed Weights   08/08/22 1205 08/14/22 0038 09/07/22 0057  Weight: 81 kg 78.2 kg 78.2 kg    Scheduled Meds:  vitamin B-12  1,000 mcg Oral Daily   divalproex  500 mg Oral Q12H   donepezil  5 mg Oral Daily   enoxaparin (LOVENOX) injection  40 mg Subcutaneous Q24H   feeding supplement  237 mL Oral BID BM   lidocaine  1 patch Transdermal Q24H   melatonin  5 mg Oral QHS   polyethylene glycol  17 g Oral BID   pravastatin  40 mg Oral QHS   QUEtiapine  100 mg Oral TID   rOPINIRole  0.5 mg Oral QHS   tamsulosin  0.4 mg Oral QPC supper   traZODone  150 mg Oral QHS   Vitamin D (Ergocalciferol)  50,000 Units Oral Q7 days   Continuous Infusions:  Nutritional status     Body mass index is 24.74 kg/m.  Data Reviewed:   CBC: Recent Labs  Lab 10/16/22 0516 10/18/22 0530 10/19/22 0425 10/20/22 0503  WBC 4.5 4.7 3.7* 4.4  NEUTROABS  --   --  1.7 2.1  HGB 11.6* 11.1* 10.5* 11.2*  HCT 36.1* 34.0* 32.7* 35.1*  MCV 94.8 95.2 94.5 95.9  PLT 224 227 219 225   Basic Metabolic Panel: Recent Labs  Lab 10/17/22 0600 10/18/22 0530 10/19/22 0425 10/20/22 0503 10/20/22 0900  NA 140 139 140 139 138  K 4.2 3.9 3.9 4.3 4.4  CL 106 104 108 106 103  CO2 27 28 27 30 27   GLUCOSE 92 85 88 90 107*  BUN 30* 33* 28* 33* 31*  CREATININE 1.46* 1.35* 1.18 1.24 1.29*  CALCIUM 8.9 8.6* 8.6* 8.9 8.7*  MG  --   --   --   --  2.0   GFR: Estimated Creatinine Clearance: 44.8 mL/min (A) (by C-G formula based on SCr of 1.29 mg/dL (H)). Liver Function Tests: No results for input(s): "AST", "ALT", "ALKPHOS", "BILITOT", "PROT", "ALBUMIN" in the last 168 hours. No results for input(s): "LIPASE", "AMYLASE" in the last 168 hours. No results for input(s): "AMMONIA" in the last 168 hours. Coagulation Profile: No results for input(s): "INR", "PROTIME" in the last 168 hours. Cardiac Enzymes: No  results for input(s): "CKTOTAL", "CKMB", "CKMBINDEX", "TROPONINI" in the last 168 hours. BNP (last 3 results) No results for input(s): "PROBNP" in the last 8760 hours. HbA1C: No results for input(s): "HGBA1C" in the last 72 hours. CBG: No results for input(s): "GLUCAP" in the last 168 hours. Lipid Profile: No results for input(s): "CHOL", "HDL", "LDLCALC", "TRIG", "CHOLHDL", "LDLDIRECT" in the last 72 hours. Thyroid Function Tests: No results for input(s): "TSH", "T4TOTAL", "FREET4", "T3FREE", "THYROIDAB" in the last 72 hours. Anemia Panel: No results for input(s): "VITAMINB12", "FOLATE", "FERRITIN", "TIBC", "IRON", "RETICCTPCT" in the last 72 hours. Sepsis Labs: No results for input(s): "PROCALCITON", "LATICACIDVEN" in the last 168 hours.  Recent Results (from the past 240 hour(s))  Urine  Culture     Status: Abnormal   Collection Time: 10/15/22  1:13 PM   Specimen: Urine, Random  Result Value Ref Range Status   Specimen Description   Final    URINE, RANDOM Performed at [facility], [address] [place], [place] [address]    Special Requests   Final    NONE Reflexed from [id number] Performed at [facility], [address] [place], [place] [address]    Culture (A)  Final    60,000 COLONIES/mL AEROCOCCUS URINAE Standardized susceptibility testing for this organism is not available. Performed at [facility], [address], [place], [place] [address]    Report Status 10/19/2022 FINAL  Final         Radiology Studies: No results found.         LOS: 46 days   Time spent= 35 mins    Ladye Macnaughton Joline Maxcy, MD [facility]  If 7PM-7AM, please contact night-coverage  10/22/2022, 7:58 AM

## 2022-10-23 LAB — BASIC METABOLIC PANEL
Anion gap: 9 (ref 5–15)
BUN: 30 mg/dL — ABNORMAL HIGH (ref 8–23)
CO2: 22 mmol/L (ref 22–32)
Calcium: 8.4 mg/dL — ABNORMAL LOW (ref 8.9–10.3)
Chloride: 107 mmol/L (ref 98–111)
Creatinine, Ser: 1.21 mg/dL (ref 0.61–1.24)
GFR, Estimated: 59 mL/min — ABNORMAL LOW (ref >60)
Glucose, Bld: 83 mg/dL (ref 70–99)
Potassium: 4.5 mmol/L (ref 3.5–5.1)
Sodium: 138 mmol/L (ref 135–145)

## 2022-10-23 LAB — MAGNESIUM: Magnesium: 2.2 mg/dL (ref 1.7–2.4)

## 2022-10-23 NOTE — Progress Notes (Signed)
PROGRESS NOTE    Terry Sloan  ZOX:[id number] DOB: 27-Jul-1940 DOA: 08/08/2022 PCP: Pcp, No   Brief Narrative:  82 year old male with history of dementia, behavioral disturbance, hyperlipidemia, BPH, insomnia, depression, anxiety, who presents to emergency department from [facility] rehab dementia department on 08/08/2022 for chief concerns of aggression with aggressive outburst towards staff. [name] on 2/24 due to agitation.  Was in the ED until 3/1 as he required SNF placement.  Sent to [facility] and [name].  On 3/7 returned back to ER for confusion, combative with staff.  Was IVC in the ED.Behavioral health was consulted.  TOC was consulted for placement to SNF again. however, was declined by facilities due to need for as needed Haldol. Initially in the ED on 4/5 patient became hypotensive, hypothermic with change in mentation therefore admitted to the hospital.  Infectious workup was unremarkable.  Due to his agitation, psychiatry team was consulted. On 4/27 during hospitalization he was diagnosed with parainfluenza infection which was treated conservatively.  Currently pending safe disposition  Assessment & Plan:  Principal Problem:   Dementia with behavioral disturbance (HCC) Active Problems:   Mixed hyperlipidemia   History of TIA (transient ischemic attack)   Anxiety and depression   BPH (benign prostatic hyperplasia)   OAB (overactive bladder)   Altered mental status   Hypothermia   Restless leg   Vitamin B12 deficiency   Parainfluenza infection    Agitation, Dementia with behavioral disturbances. Appears to be calm this morning.  Currently on Depakote, Seroquel and Aricept.  Intermittent significant agitation.  Prn Haldol and/or Zyprexa. Seen by psychiatry Cont bedtime trazodone 150 mg  Parainfluenza infection. Treated conservatively.  Diagnosed on 4/27.   Hypotension, hypothermia. Resolved, no obvious infection   Bilateral lower  extremity edema. Lower extremity duplex were negative.  Supportive care.  This is resolved for now   Restless leg. Continue Requip at nighttime.   Anxiety and depression. -On Depakote and Seroquel   Vitamin B12 and D deficiency. On oral supplements.    BPH. Continuing Flomax.   Fecal incontinence secondary to constipation Bowel regimen   Body mass index is 24.74 kg/m.  Interventions:        Diet: Regular diet DVT Prophylaxis: Subcutaneous Lovenox    Advance goals of care discussion: Full code   Family Communication: Son and daughter at bedside; 5/20   Disposition:  Pt is from  [facility] nursing facility in dementia unit, still has behavioral issue, pending determination of guardian and placement, which precludes a safe discharge. Discharge to dementia unit, when bed will be available, awaiting for guardianship.  TOC is following for placement         Diet Orders (From admission, onward)     Start     Ordered   10/15/22 1308  Diet regular Fluid consistency: Thin  Diet effective now       Comments: Please add plastic utensils.  Question:  Fluid consistency:  Answer:  Thin   10/15/22 1307            Subjective:  Doing well no complaints. Overnight had agitation requiring Haldol  Examination: Constitutional: Not in acute distress Respiratory: Clear to auscultation bilaterally Cardiovascular: Normal sinus rhythm, no rubs Abdomen: Nontender nondistended good bowel sounds Musculoskeletal: No edema noted Skin: No rashes seen Neurologic: CN 2-12 grossly intact.  And nonfocal Psychiatric: Poor judgment and insight.  Alert to name only   Objective: Vitals:   10/22/22 0251 10/22/22 0715 10/22/22 1914  10/23/22 0633  BP: (!) 93/56 107/66 123/61 131/70  Pulse: 77 74 91 83  Resp: 18 16 17 18   Temp: 97.8 F (36.6 C) (!) 97.5 F (36.4 C) 99.1 F (37.3 C) 98.9 F (37.2 C)  TempSrc: Axillary     SpO2: 98% 98% 98% 96%  Weight:      Height:         Intake/Output Summary (Last 24 hours) at 10/23/2022 0741 Last data filed at 10/23/2022 0645 Gross per 24 hour  Intake 480 ml  Output 1650 ml  Net -1170 ml   Filed Weights   08/08/22 1205 08/14/22 0038 09/07/22 0057  Weight: 81 kg 78.2 kg 78.2 kg    Scheduled Meds:  vitamin B-12  1,000 mcg Oral Daily   divalproex  500 mg Oral Q12H   donepezil  5 mg Oral Daily   enoxaparin (LOVENOX) injection  40 mg Subcutaneous Q24H   feeding supplement  237 mL Oral BID BM   lidocaine  1 patch Transdermal Q24H   melatonin  5 mg Oral QHS   polyethylene glycol  17 g Oral BID   pravastatin  40 mg Oral QHS   QUEtiapine  100 mg Oral TID   rOPINIRole  0.5 mg Oral QHS   tamsulosin  0.4 mg Oral QPC supper   traZODone  150 mg Oral QHS   Vitamin D (Ergocalciferol)  50,000 Units Oral Q7 days   Continuous Infusions:  Nutritional status     Body mass index is 24.74 kg/m.  Data Reviewed:   CBC: Recent Labs  Lab 10/18/22 0530 10/19/22 0425 10/20/22 0503  WBC 4.7 3.7* 4.4  NEUTROABS  --  1.7 2.1  HGB 11.1* 10.5* 11.2*  HCT 34.0* 32.7* 35.1*  MCV 95.2 94.5 95.9  PLT 227 219 225   Basic Metabolic Panel: Recent Labs  Lab 10/17/22 0600 10/18/22 0530 10/19/22 0425 10/20/22 0503 10/20/22 0900  NA 140 139 140 139 138  K 4.2 3.9 3.9 4.3 4.4  CL 106 104 108 106 103  CO2 27 28 27 30 27   GLUCOSE 92 85 88 90 107*  BUN 30* 33* 28* 33* 31*  CREATININE 1.46* 1.35* 1.18 1.24 1.29*  CALCIUM 8.9 8.6* 8.6* 8.9 8.7*  MG  --   --   --   --  2.0   GFR: Estimated Creatinine Clearance: 44.8 mL/min (A) (by C-G formula based on SCr of 1.29 mg/dL (H)). Liver Function Tests: No results for input(s): "AST", "ALT", "ALKPHOS", "BILITOT", "PROT", "ALBUMIN" in the last 168 hours. No results for input(s): "LIPASE", "AMYLASE" in the last 168 hours. No results for input(s): "AMMONIA" in the last 168 hours. Coagulation Profile: No results for input(s): "INR", "PROTIME" in the last 168 hours. Cardiac  Enzymes: No results for input(s): "CKTOTAL", "CKMB", "CKMBINDEX", "TROPONINI" in the last 168 hours. BNP (last 3 results) No results for input(s): "PROBNP" in the last 8760 hours. HbA1C: No results for input(s): "HGBA1C" in the last 72 hours. CBG: No results for input(s): "GLUCAP" in the last 168 hours. Lipid Profile: No results for input(s): "CHOL", "HDL", "LDLCALC", "TRIG", "CHOLHDL", "LDLDIRECT" in the last 72 hours. Thyroid Function Tests: No results for input(s): "TSH", "T4TOTAL", "FREET4", "T3FREE", "THYROIDAB" in the last 72 hours. Anemia Panel: No results for input(s): "VITAMINB12", "FOLATE", "FERRITIN", "TIBC", "IRON", "RETICCTPCT" in the last 72 hours. Sepsis Labs: No results for input(s): "PROCALCITON", "LATICACIDVEN" in the last 168 hours.  Recent Results (from the past 240 hour(s))  Urine Culture  Status: Abnormal   Collection Time: 10/15/22  1:13 PM   Specimen: Urine, Random  Result Value Ref Range Status   Specimen Description   Final    URINE, RANDOM Performed at [facility], [address] [place], [place] [address]    Special Requests   Final    NONE Reflexed from [id number] Performed at [facility], [address] [place], [place] [address]    Culture (A)  Final    60,000 COLONIES/mL AEROCOCCUS URINAE Standardized susceptibility testing for this organism is not available. Performed at [facility], [address] [place], [place] [address]    Report Status 10/19/2022 FINAL  Final         Radiology Studies: No results found.         LOS: 47 days   Time spent= 35 mins    Margreat Widener Joline Maxcy, MD [facility]  If 7PM-7AM, please contact night-coverage  10/23/2022, 7:41 AM

## 2022-10-23 NOTE — TOC Progression Note (Signed)
Transition of Care [facility]) - Progression Note    Patient Details  Name: Terry Sloan MRN: [id number] Date of Birth: 23-Jan-1941  Transition of Care [facility]) CM/SW Contact  Darleene Cleaver, [place] Phone Number: 10/23/2022, 7:18 PM  Clinical Narrative:     TOC to continue trying to find memory care placement facility for patient.  Expected Discharge Plan:  (TBD) Barriers to Discharge: Continued Medical Work up  Expected Discharge Plan and Services  Unknown patient needs a memory care facility.                                             Social Determinants of Health (SDOH) Interventions SDOH Screenings   Food Insecurity: Patient Unable To Answer (09/07/2022)  Housing: Low Risk  (09/07/2022)  Transportation Needs: Patient Unable To Answer (09/07/2022)  Utilities: Not At Risk (07/23/2022)  Alcohol Screen: Low Risk  (07/23/2022)  Depression (PHQ2-9): Low Risk  (07/23/2022)  Recent Concern: Depression (PHQ2-9) - Medium Risk (06/17/2022)  Financial Resource Strain: Low Risk  (07/23/2022)  Physical Activity: Insufficiently Active (07/23/2022)  Social Connections: Socially Isolated (07/23/2022)  Stress: No Stress Concern Present (07/23/2022)  Tobacco Use: Medium Risk (09/11/2022)    Readmission Risk Interventions     No data to display

## 2022-10-23 NOTE — Progress Notes (Signed)
Mobility Specialist - Progress Note   10/23/22 1015  Mobility  Activity Ambulated with assistance in hallway  Level of Assistance Standby assist, set-up cues, supervision of patient - no hands on  Assistive Device Front wheel walker  Distance Ambulated (ft) 170 ft  Activity Response Tolerated well  Mobility Referral Yes  $Mobility charge 1 Mobility  Mobility Specialist Start Time (ACUTE ONLY) 1001  Mobility Specialist Stop Time (ACUTE ONLY) 1014  Mobility Specialist Time Calculation (min) (ACUTE ONLY) 13 min   Pt OOB on RA upon arrival. Pt ambulates in hallway SBA with VC for safe RW management. Pt returns to EOB with needs in reach.   Terrilyn Saver  Mobility Specialist  10/23/22 10:16 AM

## 2022-10-23 NOTE — Progress Notes (Signed)
Tech called for assistance. Patient was witnessed kicking and punching on tech after reported multiple times of redirection. Administered 5 mg Haldol IM in right deltoid for patients aggression due to multiple failed attempts of trying to calm him down and redirection.

## 2022-10-24 MED ORDER — POLYETHYLENE GLYCOL 3350 17 G PO PACK
17.0000 g | PACK | Freq: Every day | ORAL | Status: DC
  Administered 2022-10-26 – 2022-11-08 (×10): 17 g via ORAL
  Filled 2022-10-24 (×10): qty 1

## 2022-10-24 NOTE — Progress Notes (Signed)
PROGRESS NOTE    Terry Sloan  ZOX:[id number] DOB: 26-Sep-1940 DOA: 08/08/2022 PCP: Pcp, No   Brief Narrative:  82 year old male with history of dementia, behavioral disturbance, hyperlipidemia, BPH, insomnia, depression, anxiety, who presents to emergency department from [facility] rehab dementia department on 08/08/2022 for chief concerns of aggression with aggressive outburst towards staff. Presented to [name] ED on 2/24 due to agitation.  Was in the ED until 3/1 as he required SNF placement.  Sent to [facility] and [name].  On 3/7 returned back to ER for confusion, combative with staff.  Was IVC in the ED.Behavioral health was consulted.  TOC was consulted for placement to SNF again. however, was declined by facilities due to need for as needed Haldol. Initially in the ED on 4/5 patient became hypotensive, hypothermic with change in mentation therefore admitted to the hospital.  Infectious workup was unremarkable.  Due to his agitation, psychiatry team was consulted. On 4/27 during hospitalization he was diagnosed with parainfluenza infection which was treated conservatively.  Currently pending safe disposition  Assessment & Plan:  Principal Problem:   Dementia with behavioral disturbance (HCC) Active Problems:   Mixed hyperlipidemia   History of TIA (transient ischemic attack)   Anxiety and depression   BPH (benign prostatic hyperplasia)   OAB (overactive bladder)   Altered mental status   Hypothermia   Restless leg   Vitamin B12 deficiency   Parainfluenza infection    Agitation, Dementia with behavioral disturbances. Appears to be calm this morning.  Currently on Depakote, Seroquel and Aricept.  Intermittent significant agitation.  Prn Haldol and/or Zyprexa. Seen by psychiatry Continue bedtime trazodone 150 mg   Parainfluenza infection. Treated conservatively.  Diagnosed on 4/27.   Hypotension, hypothermia. Resolved, no obvious infection   Bilateral  lower extremity edema. Lower extremity duplex were negative.  Supportive care.  This is resolved for now   Restless leg. Continue Requip at nighttime.   Anxiety and depression. -On Depakote and Seroquel   Vitamin B12 and D deficiency. On oral supplements.    BPH. Continuing Flomax.   Fecal incontinence secondary to constipation Bowel regimen   Body mass index is 24.74 kg/m.  Interventions:        Diet: Regular diet DVT Prophylaxis: Subcutaneous Lovenox    Advance goals of care discussion: Full code   Family Communication: Son and daughter at bedside; 5/20   Disposition:  Pt is from  [facility] nursing facility in dementia unit, still has behavioral issue, pending determination of guardian and placement, which precludes a safe discharge. Discharge to dementia unit, when bed will be available, awaiting for guardianship.  TOC is following for placement         Diet Orders (From admission, onward)     Start     Ordered   10/15/22 1308  Diet regular Fluid consistency: Thin  Diet effective now       Comments: Please add plastic utensils.  Question:  Fluid consistency:  Answer:  Thin   10/15/22 1307            Subjective: Sitting on the recliner.  No complaints  Examination: Constitutional: Not in acute distress Respiratory: Clear to auscultation bilaterally Cardiovascular: Normal sinus rhythm, no rubs Abdomen: Nontender nondistended good bowel sounds Musculoskeletal: No edema noted Skin: No rashes seen Neurologic: CN 2-12 grossly intact.  And nonfocal Psychiatric: Alert to name.  Poor judgment and insight   Objective: Vitals:   10/23/22 8119 10/23/22 1478 10/23/22 2125 10/24/22 2956  BP: 131/70 115/73 (!) 113/50 99/63  Pulse: 83 67 89 79  Resp: 18 17 18 16   Temp: 98.9 F (37.2 C) 97.6 F (36.4 C) 97.6 F (36.4 C) 98.2 F (36.8 C)  TempSrc:    Oral  SpO2: 96% 96% 98% 98%  Weight:      Height:       No intake or output data in the 24 hours  ending 10/24/22 0815  Filed Weights   08/08/22 1205 08/14/22 0038 09/07/22 0057  Weight: 81 kg 78.2 kg 78.2 kg    Scheduled Meds:  vitamin B-12  1,000 mcg Oral Daily   divalproex  500 mg Oral Q12H   donepezil  5 mg Oral Daily   enoxaparin (LOVENOX) injection  40 mg Subcutaneous Q24H   feeding supplement  237 mL Oral BID BM   lidocaine  1 patch Transdermal Q24H   melatonin  5 mg Oral QHS   polyethylene glycol  17 g Oral BID   pravastatin  40 mg Oral QHS   QUEtiapine  100 mg Oral TID   rOPINIRole  0.5 mg Oral QHS   tamsulosin  0.4 mg Oral QPC supper   traZODone  150 mg Oral QHS   Vitamin D (Ergocalciferol)  50,000 Units Oral Q7 days   Continuous Infusions:  Nutritional status     Body mass index is 24.74 kg/m.  Data Reviewed:   CBC: Recent Labs  Lab 10/18/22 0530 10/19/22 0425 10/20/22 0503  WBC 4.7 3.7* 4.4  NEUTROABS  --  1.7 2.1  HGB 11.1* 10.5* 11.2*  HCT 34.0* 32.7* 35.1*  MCV 95.2 94.5 95.9  PLT 227 219 225   Basic Metabolic Panel: Recent Labs  Lab 10/18/22 0530 10/19/22 0425 10/20/22 0503 10/20/22 0900 10/23/22 0806  NA 139 140 139 138 138  K 3.9 3.9 4.3 4.4 4.5  CL 104 108 106 103 107  CO2 28 27 30 27 22   GLUCOSE 85 88 90 107* 83  BUN 33* 28* 33* 31* 30*  CREATININE 1.35* 1.18 1.24 1.29* 1.21  CALCIUM 8.6* 8.6* 8.9 8.7* 8.4*  MG  --   --   --  2.0 2.2   GFR: Estimated Creatinine Clearance: 47.8 mL/min (by C-G formula based on SCr of 1.21 mg/dL). Liver Function Tests: No results for input(s): "AST", "ALT", "ALKPHOS", "BILITOT", "PROT", "ALBUMIN" in the last 168 hours. No results for input(s): "LIPASE", "AMYLASE" in the last 168 hours. No results for input(s): "AMMONIA" in the last 168 hours. Coagulation Profile: No results for input(s): "INR", "PROTIME" in the last 168 hours. Cardiac Enzymes: No results for input(s): "CKTOTAL", "CKMB", "CKMBINDEX", "TROPONINI" in the last 168 hours. BNP (last 3 results) No results for input(s): "PROBNP"  in the last 8760 hours. HbA1C: No results for input(s): "HGBA1C" in the last 72 hours. CBG: No results for input(s): "GLUCAP" in the last 168 hours. Lipid Profile: No results for input(s): "CHOL", "HDL", "LDLCALC", "TRIG", "CHOLHDL", "LDLDIRECT" in the last 72 hours. Thyroid Function Tests: No results for input(s): "TSH", "T4TOTAL", "FREET4", "T3FREE", "THYROIDAB" in the last 72 hours. Anemia Panel: No results for input(s): "VITAMINB12", "FOLATE", "FERRITIN", "TIBC", "IRON", "RETICCTPCT" in the last 72 hours. Sepsis Labs: No results for input(s): "PROCALCITON", "LATICACIDVEN" in the last 168 hours.  Recent Results (from the past 240 hour(s))  Urine Culture     Status: Abnormal   Collection Time: 10/15/22  1:13 PM   Specimen: Urine, Random  Result Value Ref Range Status   Specimen Description  Final    URINE, RANDOM Performed at [facility], [address] [place], [place] [address]    Special Requests   Final    NONE Reflexed from [id number] Performed at [facility], [address] [place], [place] [address]    Culture (A)  Final    60,000 COLONIES/mL AEROCOCCUS URINAE Standardized susceptibility testing for this organism is not available. Performed at [facility], [address] [place], [place] [address]    Report Status 10/19/2022 FINAL  Final         Radiology Studies: No results found.         LOS: 48 days   Time spent= 35 mins    Corbett Moulder Joline Maxcy, MD [facility]  If 7PM-7AM, please contact night-coverage  10/24/2022, 8:15 AM

## 2022-10-24 NOTE — Progress Notes (Signed)
Patient walking around the unit with gait belt in place and using the walker. RN and NT attempted to get the patient to sit in the wheel chair to rest. The patient continued to walk with gait belt in tact. The patient bent down and kneeled to his R knee. When RN asked the patient what he was doing. The patient stated "I am tired of walking so I bent down" RN, CN and NT assisted the patient into the wheelchair and back to bed.   RN notified Manuela Schwartz, NP of the above information.   VSS Patient now resting in bed with eyes closed. 1:1 sitter remaining in room with the patient.

## 2022-10-24 NOTE — Progress Notes (Signed)
Patient has +2 BLE pitting edema. Patient has an order for compression stockings. RN called to have some delivered to the unit.   RN notified Manuela Schwartz, NP of the above information.   RN advised to wait to place ted hose until the patient wakes up in the morning

## 2022-10-25 NOTE — Plan of Care (Signed)
  Problem: Fluid Volume: Goal: Hemodynamic stability will improve Outcome: Progressing   Problem: Education: Goal: Knowledge of General Education information will improve Description: Including pain rating scale, medication(s)/side effects and non-pharmacologic comfort measures Outcome: Progressing   Problem: Health Behavior/Discharge Planning: Goal: Ability to manage health-related needs will improve Outcome: Progressing   Problem: Activity: Goal: Risk for activity intolerance will decrease Outcome: Progressing   Problem: Nutrition: Goal: Adequate nutrition will be maintained Outcome: Progressing   Problem: Coping: Goal: Level of anxiety will decrease Outcome: Progressing   Problem: Elimination: Goal: Will not experience complications related to bowel motility Outcome: Progressing Goal: Will not experience complications related to urinary retention Outcome: Progressing   Problem: Pain Managment: Goal: General experience of comfort will improve Outcome: Progressing   Problem: Safety: Goal: Ability to remain free from injury will improve Outcome: Progressing   Problem: Skin Integrity: Goal: Risk for impaired skin integrity will decrease Outcome: Progressing   Problem: Safety: Goal: Ability to remain free from injury will improve Outcome: Progressing

## 2022-10-25 NOTE — Progress Notes (Signed)
[facility] Progress Note  Patient: Terry Sloan    NWG:[id number]  DOA: 08/08/2022     Date of Service: the patient was seen and examined on 10/25/2022  Chief Complaint  Patient presents with   Psychiatric Evaluation   Brief hospital course: 82 year old male with history of dementia, behavioral disturbance, hyperlipidemia, BPH, insomnia, depression, anxiety, who presents to emergency department from [facility] rehab dementia department on 08/08/2022 for chief concerns of aggression with aggressive outburst towards staff. Presented to [name] ED on 2/24 due to agitation.  Was in the ED until 3/1 as he required SNF placement.  Sent to [facility] and [name].  On 3/7 returned back to ER for confusion, combative with staff.  Was IVC in the ED.Behavioral health was consulted.  TOC was consulted for placement to SNF again. however, was declined by facilities due to need for as needed Haldol. Initially in the ED on 4/5 patient became hypotensive, hypothermic with change in mentation therefore admitted to the hospital.  Infectious workup was unremarkable.  Due to his agitation, psychiatry team was consulted. On 4/27 during hospitalization he was diagnosed with parainfluenza infection which was treated conservatively.   Currently pending safe disposition   Assessment and Plan:  Agitation, Dementia with behavioral disturbances. Appears to be calm this morning.  Currently on Depakote, Seroquel and Aricept.  Intermittent significant agitation.  Prn Haldol and/or Zyprexa. Seen by psychiatry Continue bedtime trazodone 150 mg    Parainfluenza infection. Treated conservatively.  Diagnosed on 4/27.   Hypotension, hypothermia. Resolved, no obvious infection   Bilateral lower extremity edema. Lower extremity duplex were negative.  Supportive care.  This is resolved for now   Restless leg. Continue Requip at nighttime.   Anxiety and depression. -On Depakote and Seroquel    Vitamin B12 and D deficiency. On oral supplements.    BPH. Continuing Flomax.   Fecal incontinence secondary to constipation Bowel regimen   Body mass index is 24.74 kg/m.  Interventions:  Diet: Regular diet DVT Prophylaxis: Subcutaneous Lovenox   Advance goals of care discussion: Full code  Family Communication: family was not present at bedside, at the time of interview.  The pt provided permission to discuss medical plan with the family. Opportunity was given to ask question and all questions were answered satisfactorily.   Disposition:  Pt is from  [facility] nursing facility in dementia unit, still has behavioral issue, pending determination of guardian and placement, which precludes a safe discharge. Discharge to dementia unit, when bed will be available, awaiting for guardianship.  TOC is following for placement   Subjective: No significant events overnight, patient was ambulating in the hallway.  Physical Exam: General: NAD, ambulating in the hallway Appear in no distress, affect appropriate Eyes: PERRLA ENT: Oral Mucosa Clear, moist  Neck: no JVD,  Cardiovascular: S1 and S2 Present, no Murmur,  Respiratory: good respiratory effort, Bilateral Air entry equal and Decreased, no Crackles, no wheezes Abdomen: Bowel Sound present, Soft and no tenderness,  Skin: no rashes Extremities: no Pedal edema, no calf tenderness Neurologic: without any new focal findings Gait not checked due to patient safety concerns  Vitals:   10/24/22 2028 10/24/22 2250 10/25/22 0419 10/25/22 0729  BP: (!) 124/94 (!) 90/56 (!) 117/55 124/68  Pulse: 85 79 76 75  Resp: 18 18 20 17   Temp: 98.3 F (36.8 C) 98.2 F (36.8 C) 97.9 F (36.6 C) 97.7 F (36.5 C)  TempSrc: Oral     SpO2: 100% 97% 100% 100%  Weight:      Height:       No intake or output data in the 24 hours ending 10/25/22 1244 Filed Weights   08/08/22 1205 08/14/22 0038 09/07/22 0057  Weight: 81 kg 78.2 kg 78.2 kg     Data Reviewed: I have personally reviewed and interpreted daily labs, tele strips, imagings as discussed above. I reviewed all nursing notes, pharmacy notes, vitals, pertinent old records I have discussed plan of care as described above with RN and patient/family.  CBC: Recent Labs  Lab 10/19/22 0425 10/20/22 0503  WBC 3.7* 4.4  NEUTROABS 1.7 2.1  HGB 10.5* 11.2*  HCT 32.7* 35.1*  MCV 94.5 95.9  PLT 219 225   Basic Metabolic Panel: Recent Labs  Lab 10/19/22 0425 10/20/22 0503 10/20/22 0900 10/23/22 0806  NA 140 139 138 138  K 3.9 4.3 4.4 4.5  CL 108 106 103 107  CO2 27 30 27 22   GLUCOSE 88 90 107* 83  BUN 28* 33* 31* 30*  CREATININE 1.18 1.24 1.29* 1.21  CALCIUM 8.6* 8.9 8.7* 8.4*  MG  --   --  2.0 2.2    Studies: No results found.  Scheduled Meds:  vitamin B-12  1,000 mcg Oral Daily   divalproex  500 mg Oral Q12H   donepezil  5 mg Oral Daily   enoxaparin (LOVENOX) injection  40 mg Subcutaneous Q24H   feeding supplement  237 mL Oral BID BM   lidocaine  1 patch Transdermal Q24H   melatonin  5 mg Oral QHS   polyethylene glycol  17 g Oral Daily   pravastatin  40 mg Oral QHS   QUEtiapine  100 mg Oral TID   rOPINIRole  0.5 mg Oral QHS   tamsulosin  0.4 mg Oral QPC supper   traZODone  150 mg Oral QHS   Vitamin D (Ergocalciferol)  50,000 Units Oral Q7 days   Continuous Infusions: PRN Meds: acetaminophen, bisacodyl, diphenhydrAMINE, guaiFENesin-dextromethorphan, haloperidol **OR** haloperidol lactate, hydrALAZINE, ipratropium-albuterol, metoprolol tartrate, OLANZapine  Time spent: 35 minutes  Author: Gillis Santa. MD [facility] 10/25/2022 12:44 PM  To reach On-call, see care teams to locate the attending and reach out to them via [email or web address]. If 7PM-7AM, please contact night-coverage If you still have difficulty reaching the attending provider, please page the [facility] (Director on Call) for [facility] on amion for assistance.

## 2022-10-26 ENCOUNTER — Inpatient Hospital Stay: Payer: Medicare HMO

## 2022-10-26 LAB — BASIC METABOLIC PANEL
Anion gap: 5 (ref 5–15)
BUN: 31 mg/dL — ABNORMAL HIGH (ref 8–23)
CO2: 28 mmol/L (ref 22–32)
Calcium: 8.4 mg/dL — ABNORMAL LOW (ref 8.9–10.3)
Chloride: 108 mmol/L (ref 98–111)
Creatinine, Ser: 1.26 mg/dL — ABNORMAL HIGH (ref 0.61–1.24)
GFR, Estimated: 57 mL/min — ABNORMAL LOW (ref >60)
Glucose, Bld: 103 mg/dL — ABNORMAL HIGH (ref 70–99)
Potassium: 3.7 mmol/L (ref 3.5–5.1)
Sodium: 141 mmol/L (ref 135–145)

## 2022-10-26 LAB — MAGNESIUM: Magnesium: 2.2 mg/dL (ref 1.7–2.4)

## 2022-10-26 MED ORDER — FUROSEMIDE 20 MG PO TABS
20.0000 mg | ORAL_TABLET | Freq: Every day | ORAL | Status: AC
  Administered 2022-10-27 – 2022-10-28 (×2): 20 mg via ORAL
  Filled 2022-10-26: qty 1

## 2022-10-26 NOTE — Progress Notes (Signed)
[facility] Progress Note  Patient: Terry Sloan    WGN:[id number]  DOA: 08/08/2022     Date of Service: the patient was seen and examined on 10/26/2022  Chief Complaint  Patient presents with   Psychiatric Evaluation   Brief hospital course: 82 year old male with history of dementia, behavioral disturbance, hyperlipidemia, BPH, insomnia, depression, anxiety, who presents to emergency department from [facility] rehab dementia department on 08/08/2022 for chief concerns of aggression with aggressive outburst towards staff. Presented to [name] ED on 2/24 due to agitation.  Was in the ED until 3/1 as he required SNF placement.  Sent to [facility] and [name].  On 3/7 returned back to ER for confusion, combative with staff.  Was IVC in the ED.Behavioral health was consulted.  TOC was consulted for placement to SNF again. however, was declined by facilities due to need for as needed Haldol. Initially in the ED on 4/5 patient became hypotensive, hypothermic with change in mentation therefore admitted to the hospital.  Infectious workup was unremarkable.  Due to his agitation, psychiatry team was consulted. On 4/27 during hospitalization he was diagnosed with parainfluenza infection which was treated conservatively.   Currently pending safe disposition   Assessment and Plan:  Agitation, Dementia with behavioral disturbances. Appears to be calm this morning.  Currently on Depakote, Seroquel and Aricept.  Intermittent significant agitation.  Prn Haldol and/or Zyprexa. Seen by psychiatry Continue bedtime trazodone 150 mg    Parainfluenza infection. Treated conservatively.  Diagnosed on 4/27.   Hypotension, hypothermia. Resolved, no obvious infection   Recurrent bilateral lower extremity edema. Lower extremity duplex were negative.  5/25 significant edema, right >left 5/25 started Lasix 20 mg p.o. daily for 3 days Monitor renal functions and urine output Repeat venous  duplex lower extremity to rule out DVT Patient was advised to keep legs elevated, and use stockings if possible.    Restless leg. Continue Requip at nighttime.   Anxiety and depression. -On Depakote and Seroquel   Vitamin B12 and D deficiency. On oral supplements.    BPH. Continuing Flomax.   Fecal incontinence secondary to constipation Bowel regimen   Body mass index is 24.74 kg/m.  Interventions:  Diet: Regular diet DVT Prophylaxis: Subcutaneous Lovenox   Advance goals of care discussion: Full code  Family Communication: family was not present at bedside, at the time of interview.  The pt provided permission to discuss medical plan with the family. Opportunity was given to ask question and all questions were answered satisfactorily.   Disposition:  Pt is from  [facility] nursing facility in dementia unit, still has behavioral issue, pending determination of guardian and placement, which precludes a safe discharge. Discharge to dementia unit, when bed will be available, awaiting for guardianship.  TOC is following for placement   Subjective: No significant events overnight, as per caregiver patient is intermittently agitated, patient is ambulating in the hallway.  Patient is again having lower extremity edema, right more than the left.  Patient agreed for venous duplex.  Physical Exam: General: NAD, ambulating in the hallway Appear in no distress, affect appropriate Eyes: PERRLA ENT: Oral Mucosa Clear, moist  Neck: no JVD,  Cardiovascular: S1 and S2 Present, no Murmur,  Respiratory: good respiratory effort, Bilateral Air entry equal and Decreased, no Crackles, no wheezes Abdomen: Bowel Sound present, Soft and no tenderness,  Skin: no rashes Extremities: no Pedal edema, no calf tenderness Neurologic: without any new focal findings Gait not checked due to patient safety concerns  Vitals:   10/25/22 0419 10/25/22 0729 10/25/22 1421 10/26/22 0624  BP: (!) 117/55  124/68 112/61 (!) 98/54  Pulse: 76 75 83 (!) 54  Resp: 20 17 16 17   Temp: 97.9 F (36.6 C) 97.7 F (36.5 C) 98.2 F (36.8 C)   TempSrc:      SpO2: 100% 100% 100% 99%  Weight:      Height:       No intake or output data in the 24 hours ending 10/26/22 1328 Filed Weights   08/08/22 1205 08/14/22 0038 09/07/22 0057  Weight: 81 kg 78.2 kg 78.2 kg    Data Reviewed: I have personally reviewed and interpreted daily labs, tele strips, imagings as discussed above. I reviewed all nursing notes, pharmacy notes, vitals, pertinent old records I have discussed plan of care as described above with RN and patient/family.  CBC: Recent Labs  Lab 10/20/22 0503  WBC 4.4  NEUTROABS 2.1  HGB 11.2*  HCT 35.1*  MCV 95.9  PLT 225   Basic Metabolic Panel: Recent Labs  Lab 10/20/22 0503 10/20/22 0900 10/23/22 0806 10/26/22 0448  NA 139 138 138 141  K 4.3 4.4 4.5 3.7  CL 106 103 107 108  CO2 30 27 22 28   GLUCOSE 90 107* 83 103*  BUN 33* 31* 30* 31*  CREATININE 1.24 1.29* 1.21 1.26*  CALCIUM 8.9 8.7* 8.4* 8.4*  MG  --  2.0 2.2 2.2    Studies: No results found.  Scheduled Meds:  vitamin B-12  1,000 mcg Oral Daily   divalproex  500 mg Oral Q12H   donepezil  5 mg Oral Daily   enoxaparin (LOVENOX) injection  40 mg Subcutaneous Q24H   feeding supplement  237 mL Oral BID BM   furosemide  20 mg Oral Daily   lidocaine  1 patch Transdermal Q24H   melatonin  5 mg Oral QHS   polyethylene glycol  17 g Oral Daily   pravastatin  40 mg Oral QHS   QUEtiapine  100 mg Oral TID   rOPINIRole  0.5 mg Oral QHS   tamsulosin  0.4 mg Oral QPC supper   traZODone  150 mg Oral QHS   Vitamin D (Ergocalciferol)  50,000 Units Oral Q7 days   Continuous Infusions: PRN Meds: acetaminophen, bisacodyl, diphenhydrAMINE, guaiFENesin-dextromethorphan, haloperidol **OR** haloperidol lactate, hydrALAZINE, ipratropium-albuterol, metoprolol tartrate, OLANZapine  Time spent: 35 minutes  Author: Gillis Santa.  MD [facility] 10/26/2022 1:28 PM  To reach On-call, see care teams to locate the attending and reach out to them via [email or web address]. If 7PM-7AM, please contact night-coverage If you still have difficulty reaching the attending provider, please page the [facility] (Director on Call) for [facility] on amion for assistance.

## 2022-10-26 NOTE — Progress Notes (Signed)
Mobility Specialist - Progress Note   10/26/22 0941  Mobility  Activity Ambulated with assistance in hallway;Stood at bedside;Dangled on edge of bed  Level of Assistance Standby assist, set-up cues, supervision of patient - no hands on  Assistive Device Front wheel walker  Distance Ambulated (ft) 180 ft  Activity Response Tolerated well  Mobility Referral Yes  $Mobility charge 1 Mobility  Mobility Specialist Start Time (ACUTE ONLY) [id number]  Mobility Specialist Stop Time (ACUTE ONLY) [id number]  Mobility Specialist Time Calculation (min) (ACUTE ONLY) 18 min   Pt sitting EOB on RA upon arrival. Pt STS and ambulates in hallway SBA with no LOB noted. Pt needs VC for redirection and safety awareness throughout ambulation. Pt returns to EOB with needs in reach and sitter present.   Terrilyn Saver  Mobility Specialist  10/26/22 9:43 AM

## 2022-10-27 LAB — BASIC METABOLIC PANEL
Anion gap: 6 (ref 5–15)
BUN: 26 mg/dL — ABNORMAL HIGH (ref 8–23)
CO2: 24 mmol/L (ref 22–32)
Calcium: 8.4 mg/dL — ABNORMAL LOW (ref 8.9–10.3)
Chloride: 107 mmol/L (ref 98–111)
Creatinine, Ser: 1.17 mg/dL (ref 0.61–1.24)
GFR, Estimated: >60 mL/min (ref >60)
Glucose, Bld: 95 mg/dL (ref 70–99)
Potassium: 4.2 mmol/L (ref 3.5–5.1)
Sodium: 137 mmol/L (ref 135–145)

## 2022-10-27 MED ORDER — LORAZEPAM 2 MG PO TABS
2.0000 mg | ORAL_TABLET | Freq: Every evening | ORAL | Status: AC
  Administered 2022-10-27: 2 mg via ORAL
  Filled 2022-10-27: qty 1

## 2022-10-27 MED ORDER — OLANZAPINE 10 MG IM SOLR: 5.0000 mg | Freq: Four times a day (QID) | INTRAMUSCULAR | Status: DC | PRN

## 2022-10-27 MED ORDER — ZIPRASIDONE MESYLATE 20 MG IM SOLR: 10.0000 mg | Freq: Once | INTRAMUSCULAR | Status: DC | PRN

## 2022-10-27 MED ORDER — OLANZAPINE 10 MG IM SOLR
5.0000 mg | Freq: Four times a day (QID) | INTRAMUSCULAR | Status: AC | PRN
  Administered 2022-10-27 (×2): 5 mg via INTRAMUSCULAR
  Filled 2022-10-27 (×3): qty 10

## 2022-10-27 MED ORDER — LORAZEPAM 2 MG PO TABS
2.0000 mg | ORAL_TABLET | Freq: Four times a day (QID) | ORAL | Status: DC | PRN
  Administered 2022-10-28 (×2): 2 mg via ORAL
  Filled 2022-10-27 (×2): qty 1

## 2022-10-27 MED ORDER — DIAZEPAM 5 MG/ML IJ SOLN: 5.0000 mg | Freq: Once | INTRAMUSCULAR | Status: DC

## 2022-10-27 NOTE — Plan of Care (Signed)
Patient confused. Alert to self and very difficult to redirect. Poor safety awareness and poor attention. He was agitated/restless during the shift. Haldol and zyprexa IM given earlier with some improvement for a short time. Dr Lucianne Muss made aware as well as psych NP. New orders received. Ativan p.o. given this evening. Patient is resting quietly.

## 2022-10-27 NOTE — Progress Notes (Signed)
[facility] Progress Note  Patient: Terry Sloan    ZOX:[id number]  DOA: 08/08/2022     Date of Service: the patient was seen and examined on 10/27/2022  Chief Complaint  Patient presents with   Psychiatric Evaluation   Brief hospital course: 82 year old male with history of dementia, behavioral disturbance, hyperlipidemia, BPH, insomnia, depression, anxiety, who presents to emergency department from [facility] rehab dementia department on 08/08/2022 for chief concerns of aggression with aggressive outburst towards staff. Presented to [name], ED on 2/24 due to agitation.  Was in the ED until 3/1 as he required SNF placement.  Sent to [place] rehab and [name].  On 3/7 returned back to ER for confusion, combative with staff.  Was IVC in the ED.Behavioral health was consulted.  TOC was consulted for placement to SNF again. however, was declined by facilities due to need for as needed Haldol. Initially in the ED on 4/5 patient became hypotensive, hypothermic with change in mentation therefore admitted to the hospital.  Infectious workup was unremarkable.  Due to his agitation, psychiatry team was consulted. On 4/27 during hospitalization he was diagnosed with parainfluenza infection which was treated conservatively.   Currently pending safe disposition   Assessment and Plan:  Agitation, Dementia with behavioral disturbances. Appears to be calm this morning.  Currently on Depakote, Seroquel and Aricept.  Intermittent significant agitation.  Prn Haldol and/or Zyprexa. Seen by psychiatry Continue bedtime trazodone 150 mg  5/26 again patient is having behavioral issues, psych was reengaged. Ativan 2 mg nightly one-time dose ordered for insomnia Geodon as needed one-time dose ordered  Parainfluenza infection. Treated conservatively.  Diagnosed on 4/27.   Hypotension, hypothermia. Resolved, no obvious infection   Recurrent bilateral lower extremity edema. Lower extremity  duplex were negative.  5/25 significant edema, right >left 5/25 started Lasix 20 mg p.o. daily for 3 days Monitor renal functions and urine output Patient was advised to keep legs elevated, and use stockings if possible. 5/26 lower extremity edema improved, venous duplex repeated negative for DVT   Restless leg. Continue Requip at nighttime.   Anxiety and depression. -On Depakote and Seroquel   Vitamin B12 and D deficiency. On oral supplements.    BPH. Continuing Flomax.   Fecal incontinence secondary to constipation Bowel regimen   Body mass index is 24.74 kg/m.  Interventions:  Diet: Regular diet DVT Prophylaxis: Subcutaneous Lovenox   Advance goals of care discussion: Full code  Family Communication: family was not present at bedside, at the time of interview.  The pt provided permission to discuss medical plan with the family. Opportunity was given to ask question and all questions were answered satisfactorily.   Disposition:  Pt is from  [facility] nursing facility in dementia unit, still has behavioral issue, pending determination of guardian and placement, which precludes a safe discharge. Discharge to dementia unit, when bed will be available, awaiting for guardianship.  TOC is following for placement   Subjective: No significant events overnight, patient did not sleep last night, in the morning time patient was agitated and he was not behaving properly with the staff. Patient received Zyprexa and Haldol.  Physical Exam: General: NAD, ambulating in the hallway Appear in no distress, affect appropriate Eyes: PERRLA ENT: Oral Mucosa Clear, moist  Neck: no JVD,  Cardiovascular: S1 and S2 Present, no Murmur,  Respiratory: good respiratory effort, Bilateral Air entry equal and Decreased, no Crackles, no wheezes Abdomen: Bowel Sound present, Soft and no tenderness,  Skin: no rashes Extremities: no  Pedal edema, no calf tenderness Neurologic: without any new  focal findings Gait not checked due to patient safety concerns  Vitals:   10/25/22 1421 10/26/22 0624 10/26/22 1900 10/27/22 0700  BP: 112/61 (!) 98/54 123/69 132/71  Pulse: 83 (!) 54 84 80  Resp: 16 17 17 16   Temp: 98.2 F (36.8 C)  97.9 F (36.6 C) 99.2 F (37.3 C)  TempSrc:    Axillary  SpO2: 100% 99%  97%  Weight:      Height:        Intake/Output Summary (Last 24 hours) at 10/27/2022 1346 Last data filed at 10/27/2022 1240 Gross per 24 hour  Intake 460 ml  Output 300 ml  Net 160 ml   Filed Weights   08/08/22 1205 08/14/22 0038 09/07/22 0057  Weight: 81 kg 78.2 kg 78.2 kg    Data Reviewed: I have personally reviewed and interpreted daily labs, tele strips, imagings as discussed above. I reviewed all nursing notes, pharmacy notes, vitals, pertinent old records I have discussed plan of care as described above with RN and patient/family.  CBC: No results for input(s): "WBC", "NEUTROABS", "HGB", "HCT", "MCV", "PLT" in the last 168 hours.  Basic Metabolic Panel: Recent Labs  Lab 10/23/22 0806 10/26/22 0448 10/27/22 0841  NA 138 141 137  K 4.5 3.7 4.2  CL 107 108 107  CO2 22 28 24   GLUCOSE 83 103* 95  BUN 30* 31* 26*  CREATININE 1.21 1.26* 1.17  CALCIUM 8.4* 8.4* 8.4*  MG 2.2 2.2  --     Studies: US Venous Img Lower Bilateral (DVT)  Result Date: 10/26/2022 CLINICAL DATA:  Bilateral lower extremity edema, right-greater-than-left. Evaluate for DVT. EXAM: BILATERAL LOWER EXTREMITY VENOUS DOPPLER ULTRASOUND TECHNIQUE: Gray-scale sonography with graded compression, as well as color Doppler and duplex ultrasound were performed to evaluate the lower extremity deep venous systems from the level of the common femoral vein and including the common femoral, femoral, profunda femoral, popliteal and calf veins including the posterior tibial, peroneal and gastrocnemius veins when visible. The superficial great saphenous vein was also interrogated. Spectral Doppler was utilized  to evaluate flow at rest and with distal augmentation maneuvers in the common femoral, femoral and popliteal veins. COMPARISON:  Bilateral lower extremity venous Doppler ultrasound-10/14/2022 (negative); right lower extremity venous Doppler ultrasound-12/10/2021 (negative) FINDINGS: RIGHT LOWER EXTREMITY Common Femoral Vein: No evidence of thrombus. Normal compressibility, respiratory phasicity and response to augmentation. Saphenofemoral Junction: No evidence of thrombus. Normal compressibility and flow on color Doppler imaging. Profunda Femoral Vein: No evidence of thrombus. Normal compressibility and flow on color Doppler imaging. Femoral Vein: No evidence of thrombus. Normal compressibility, respiratory phasicity and response to augmentation. Popliteal Vein: No evidence of thrombus. Normal compressibility, respiratory phasicity and response to augmentation. Calf Veins: No evidence of thrombus. Normal compressibility and flow on color Doppler imaging. Superficial Great Saphenous Vein: No evidence of thrombus. Normal compressibility. Other Findings: There is a minimal amount of subcutaneous edema at the level of the right calf. Scattered atherosclerotic plaque throughout the incidentally imaged right lower extremity arterial vasculature. LEFT LOWER EXTREMITY Common Femoral Vein: No evidence of thrombus. Normal compressibility, respiratory phasicity and response to augmentation. Saphenofemoral Junction: No evidence of thrombus. Normal compressibility and flow on color Doppler imaging. Profunda Femoral Vein: No evidence of thrombus. Normal compressibility and flow on color Doppler imaging. Femoral Vein: No evidence of thrombus. Normal compressibility, respiratory phasicity and response to augmentation. Popliteal Vein: No evidence of thrombus. Normal compressibility, respiratory phasicity and response  to augmentation. Calf Veins: No evidence of thrombus. Normal compressibility and flow on color Doppler imaging.  Superficial Great Saphenous Vein: No evidence of thrombus. Normal compressibility. Other Findings: There is a minimal amount of subcutaneous edema at the level of the left calf. Scattered atherosclerotic plaque throughout the incidentally imaged left lower extremity arterial vasculature. IMPRESSION: No evidence of DVT within either lower extremity. Electronically Signed   By: Simonne Come M.D.   On: 10/26/2022 16:46    Scheduled Meds:  vitamin B-12  1,000 mcg Oral Daily   divalproex  500 mg Oral Q12H   donepezil  5 mg Oral Daily   enoxaparin (LOVENOX) injection  40 mg Subcutaneous Q24H   feeding supplement  237 mL Oral BID BM   furosemide  20 mg Oral Daily   lidocaine  1 patch Transdermal Q24H   LORazepam  2 mg Oral QPM   melatonin  5 mg Oral QHS   polyethylene glycol  17 g Oral Daily   pravastatin  40 mg Oral QHS   QUEtiapine  100 mg Oral TID   rOPINIRole  0.5 mg Oral QHS   tamsulosin  0.4 mg Oral QPC supper   traZODone  150 mg Oral QHS   Vitamin D (Ergocalciferol)  50,000 Units Oral Q7 days   Continuous Infusions: PRN Meds: acetaminophen, bisacodyl, diphenhydrAMINE, guaiFENesin-dextromethorphan, haloperidol **OR** haloperidol lactate, hydrALAZINE, ipratropium-albuterol, metoprolol tartrate, OLANZapine  Time spent: 35 minutes  Author: Gillis Santa. MD [facility] 10/27/2022 1:46 PM  To reach On-call, see care teams to locate the attending and reach out to them via [email or web address]. If 7PM-7AM, please contact night-coverage If you still have difficulty reaching the attending provider, please page the [facility] (Director on Call) for [facility] on amion for assistance.

## 2022-10-27 NOTE — Progress Notes (Signed)
Pt given haldol for agitation at approximately 0020 d/t being physically and verbally aggressive towards RN and NT. Medication has not been effective. Pt still attempting to hit people and being verbally abusive. Pts ambulation privileges were revoked for the time being because pt has attempted to throw himself purposely onto the floor several times. Mitts placed on pt for staff protection d/t hitting and pinching. Pt still actively attempting to kick staff and cussing at staff. MD made aware and 5mg  IM zyprexa ordered. Given in R deltoid. Awaiting results at this time.

## 2022-10-27 NOTE — Progress Notes (Signed)
Patient is agitated. Unable to redirect. He is hitting and kicking staff. Charge nurse gave patient zypexa IM and notified Dr. Lucianne Muss of the above. Psych consult was placed. Will continue to monitor.

## 2022-10-27 NOTE — Progress Notes (Signed)
Patient agitated. He continues to try to walk and is unsteady and difficult to redirect. He is cursing staff and pushing staff away when trying to help. Haldol IM given. Patient slept for about 30 minutes. Will continue to monitor.

## 2022-10-28 LAB — BASIC METABOLIC PANEL
Anion gap: 5 (ref 5–15)
BUN: 22 mg/dL (ref 8–23)
CO2: 27 mmol/L (ref 22–32)
Calcium: 8.4 mg/dL — ABNORMAL LOW (ref 8.9–10.3)
Chloride: 106 mmol/L (ref 98–111)
Creatinine, Ser: 1.1 mg/dL (ref 0.61–1.24)
GFR, Estimated: >60 mL/min (ref >60)
Glucose, Bld: 94 mg/dL (ref 70–99)
Potassium: 3.6 mmol/L (ref 3.5–5.1)
Sodium: 138 mmol/L (ref 135–145)

## 2022-10-28 LAB — VALPROIC ACID LEVEL: Valproic Acid Lvl: 49 ug/mL — ABNORMAL LOW (ref 50.0–100.0)

## 2022-10-28 LAB — CBC
HCT: 36.3 % — ABNORMAL LOW (ref 39.0–52.0)
Hemoglobin: 11.9 g/dL — ABNORMAL LOW (ref 13.0–17.0)
MCH: 31.1 pg (ref 26.0–34.0)
MCHC: 32.8 g/dL (ref 30.0–36.0)
MCV: 94.8 fL (ref 80.0–100.0)
Platelets: 203 10*3/uL (ref 150–400)
RBC: 3.83 MIL/uL — ABNORMAL LOW (ref 4.22–5.81)
RDW: 12.7 % (ref 11.5–15.5)
WBC: 4.4 10*3/uL (ref 4.0–10.5)
nRBC: 0 % (ref 0.0–0.2)

## 2022-10-28 LAB — MAGNESIUM: Magnesium: 2.2 mg/dL (ref 1.7–2.4)

## 2022-10-28 LAB — PHOSPHORUS: Phosphorus: 4.2 mg/dL (ref 2.5–4.6)

## 2022-10-28 MED ORDER — POTASSIUM CHLORIDE CRYS ER 20 MEQ PO TBCR
40.0000 meq | EXTENDED_RELEASE_TABLET | Freq: Once | ORAL | Status: AC
  Administered 2022-10-28: 40 meq via ORAL
  Filled 2022-10-28: qty 2

## 2022-10-28 MED ORDER — QUETIAPINE FUMARATE 25 MG PO TABS
100.0000 mg | ORAL_TABLET | ORAL | Status: DC
  Administered 2022-10-28 – 2022-11-01 (×6): 100 mg via ORAL
  Filled 2022-10-28 (×4): qty 4

## 2022-10-28 MED ORDER — MIRTAZAPINE 15 MG PO TBDP
15.0000 mg | ORAL_TABLET | Freq: Every day | ORAL | Status: DC
  Administered 2022-10-28: 15 mg via ORAL
  Filled 2022-10-28: qty 1

## 2022-10-28 MED ORDER — QUETIAPINE FUMARATE 25 MG PO TABS
150.0000 mg | ORAL_TABLET | Freq: Every day | ORAL | Status: DC
  Administered 2022-10-28 – 2022-11-04 (×7): 150 mg via ORAL
  Filled 2022-10-28 (×7): qty 6

## 2022-10-28 MED ORDER — LORAZEPAM 1 MG PO TABS
1.0000 mg | ORAL_TABLET | Freq: Four times a day (QID) | ORAL | Status: DC | PRN
  Administered 2022-10-28 – 2022-10-29 (×4): 1 mg via ORAL
  Filled 2022-10-28 (×4): qty 1

## 2022-10-28 NOTE — Progress Notes (Signed)
[facility] Progress Note  Patient: Terry Sloan    UUV:[id number]  DOA: 08/08/2022     Date of Service: the patient was seen and examined on 10/28/2022  Chief Complaint  Patient presents with   Psychiatric Evaluation   Brief hospital course: 82 year old male with history of dementia, behavioral disturbance, hyperlipidemia, BPH, insomnia, depression, anxiety, who presents to emergency department from [facility] rehab dementia department on 08/08/2022 for chief concerns of aggression with aggressive outburst towards staff. Presented to [name] ED on 2/24 due to agitation.  Was in the ED until 3/1 as he required SNF placement.  Sent to [facility] and [name].  On 3/7 returned back to ER for confusion, combative with staff.  Was IVC in the ED.Behavioral health was consulted.  TOC was consulted for placement to SNF again. however, was declined by facilities due to need for as needed Haldol. Initially in the ED on 4/5 patient became hypotensive, hypothermic with change in mentation therefore admitted to the hospital.  Infectious workup was unremarkable.  Due to his agitation, psychiatry team was consulted. On 4/27 during hospitalization he was diagnosed with parainfluenza infection which was treated conservatively.   Currently pending safe disposition   Assessment and Plan:  Agitation, Dementia with behavioral disturbances. Appears to be calm this morning.  Currently on Depakote, Seroquel and Aricept.  Intermittent significant agitation.  Prn Haldol and/or Zyprexa. Seen by psychiatry Continue bedtime trazodone 150 mg  5/26 again patient is having behavioral issues, psych was reengaged. S/p Ativan 2 mg po x one dose given Patient was reevaluated by psych, started Remeron 15 mg nightly mood stabilizer, increased Seroquel 150 mg nightly  Parainfluenza infection. Treated conservatively.  Diagnosed on 4/27.   Hypotension, hypothermia. Resolved, no obvious infection    Recurrent bilateral lower extremity edema. Lower extremity duplex were negative.  5/25 significant edema, right >left 5/25 Lasix 20 mg p.o. daily for 3 days Monitor renal functions and urine output Patient was advised to keep legs elevated, and use stockings if possible. 5/26 lower extremity edema improved, venous duplex repeated negative for DVT   Restless leg. Continue Requip at nighttime.   Anxiety and depression. -On Depakote and Seroquel   Vitamin B12 and D deficiency. On oral supplements.    BPH. Continuing Flomax.   Fecal incontinence secondary to constipation Bowel regimen   Body mass index is 24.74 kg/m.  Interventions:  Diet: Regular diet DVT Prophylaxis: Subcutaneous Lovenox   Advance goals of care discussion: Full code  Family Communication: family was not present at bedside, at the time of interview.  The pt provided permission to discuss medical plan with the family. Opportunity was given to ask question and all questions were answered satisfactorily.   Disposition:  Pt is from  [facility] nursing facility in dementia unit, still has behavioral issue, pending determination of guardian and placement, which precludes a safe discharge. Discharge to dementia unit, when bed will be available, awaiting for guardianship.  TOC is following for placement   Subjective: No significant events overnight, yesterday afternoon patient was very agitated, Ativan was given, after that patient was sleepy.  Patient did not sleep last night for few hours, in the morning time patient was sleepy.  So did not wake him up.   Physical Exam: General: NAD, sleepy, resting comfortably. Appear in no distress, Eyes: Closed, sleepy ENT: Oral Mucosa Clear, moist  Neck: no JVD,  Cardiovascular: S1 and S2 Present, no Murmur,  Respiratory: good respiratory effort, Bilateral Air entry equal  and Decreased, no Crackles, no wheezes Abdomen: Bowel Sound present, Soft and no tenderness,   Skin: no rashes Extremities: no Pedal edema, no calf tenderness Neurologic: without any new focal findings Gait not checked due to patient safety concerns  Vitals:   10/26/22 1900 10/27/22 0700 10/27/22 1530 10/28/22 0750  BP: 123/69 132/71 137/74 115/66  Pulse: 84 80 79 74  Resp: 17 16 18 20   Temp: 97.9 F (36.6 C) 99.2 F (37.3 C) 98 F (36.7 C) 99 F (37.2 C)  TempSrc:  Axillary Oral   SpO2:  97% 98% 98%  Weight:      Height:        Intake/Output Summary (Last 24 hours) at 10/28/2022 1302 Last data filed at 10/28/2022 0900 Gross per 24 hour  Intake 200 ml  Output 300 ml  Net -100 ml   Filed Weights   08/08/22 1205 08/14/22 0038 09/07/22 0057  Weight: 81 kg 78.2 kg 78.2 kg    Data Reviewed: I have personally reviewed and interpreted daily labs, tele strips, imagings as discussed above. I reviewed all nursing notes, pharmacy notes, vitals, pertinent old records I have discussed plan of care as described above with RN and patient/family.  CBC: Recent Labs  Lab 10/28/22 0857  WBC 4.4  HGB 11.9*  HCT 36.3*  MCV 94.8  PLT 203    Basic Metabolic Panel: Recent Labs  Lab 10/23/22 0806 10/26/22 0448 10/27/22 0841 10/28/22 0440 10/28/22 0857  NA 138 141 137 138  --   K 4.5 3.7 4.2 3.6  --   CL 107 108 107 106  --   CO2 22 28 24 27   --   GLUCOSE 83 103* 95 94  --   BUN 30* 31* 26* 22  --   CREATININE 1.21 1.26* 1.17 1.10  --   CALCIUM 8.4* 8.4* 8.4* 8.4*  --   MG 2.2 2.2  --   --  2.2  PHOS  --   --   --   --  4.2    Studies: No results found.  Scheduled Meds:  vitamin B-12  1,000 mcg Oral Daily   divalproex  500 mg Oral Q12H   donepezil  5 mg Oral Daily   enoxaparin (LOVENOX) injection  40 mg Subcutaneous Q24H   feeding supplement  237 mL Oral BID BM   furosemide  20 mg Oral Daily   lidocaine  1 patch Transdermal Q24H   melatonin  5 mg Oral QHS   mirtazapine  15 mg Oral QHS   polyethylene glycol  17 g Oral Daily   pravastatin  40 mg Oral QHS    QUEtiapine  100 mg Oral [id number]   QUEtiapine  150 mg Oral QHS   rOPINIRole  0.5 mg Oral QHS   tamsulosin  0.4 mg Oral QPC supper   traZODone  150 mg Oral QHS   Vitamin D (Ergocalciferol)  50,000 Units Oral Q7 days   Continuous Infusions: PRN Meds: acetaminophen, bisacodyl, diphenhydrAMINE, guaiFENesin-dextromethorphan, haloperidol **OR** haloperidol lactate, hydrALAZINE, ipratropium-albuterol, LORazepam, metoprolol tartrate  Time spent: 35 minutes  Author: Gillis Santa. MD [facility] 10/28/2022 1:02 PM  To reach On-call, see care teams to locate the attending and reach out to them via [email or web address]. If 7PM-7AM, please contact night-coverage If you still have difficulty reaching the attending provider, please page the [facility] (Director on Call) for [facility] on amion for assistance.

## 2022-10-28 NOTE — Consult Note (Signed)
[facility] Face-to-Face Psychiatry Consult   Reason for Consult:  Dementia with behavioral issues   Referring Physician:  Gillis Santa. MD  Patient Identification: Terry Sloan MRN:  [id number] Principal Diagnosis: Dementia with behavioral disturbance (HCC) Diagnosis:  Principal Problem:   Dementia with behavioral disturbance (HCC) Active Problems:   Mixed hyperlipidemia   History of TIA (transient ischemic attack)   Anxiety and depression   BPH (benign prostatic hyperplasia)   OAB (overactive bladder)   Altered mental status   Hypothermia   Restless leg   Vitamin B12 deficiency   Parainfluenza infection   Total Time spent with patient: 30 minutes  Subjective:    Terry Sloan is a 82 y.o. male patient admitted with "doing fine."  HPI : Patient seen and chart reviewed. 82 y.o. male patient with a known history of dementia exhibiting severe behavioral disturbances, including aggression and physical violence. According to the nursing staff, the patient has been extremely difficult to redirect, displaying poor safety awareness and attention.  He has been physically aggressive, requiring IM and oral medications for agitation.  The staff reports that he has been hitting, kicking, biting, spitting, and throwing objects, including a food tray. He reportedly took 4 staff members to place a manual hold on him due to his combative behavior.  During today's rounding, the patient was observed lying in bed with a mitten restraint on his right hand and a one-to-one sitter at the bedside for safety precautions.  When asked about his condition, he states ,"doing fine" despite appearing restless and responding to internal stimuli during the encounter.  However, a thorough assessment was challenging as he is currently a poor historian.  I spoke with the patient's daughter, [name], who provided additional information, noting that his behavior issues seem to follow a pattern where his medication initially works, but  then becomes less effective, leading to episodes typically occurring in the afternoon and at night.  She expressed agreement with making adjustments to his medication regimen to better manage these episodes.   Past Psychiatric History: History of dementia with behavioral disturbances, worsening over the past 3 years. Admitted to [facility] 07/27/22 with aggressive behavior in the setting of worsening dementia. He presented to [facility] ED March 2024 with similar behaviors.   Risk to Self:   Risk to Others:   Prior Inpatient Therapy:   Prior Outpatient Therapy:    Past Medical History:  Past Medical History:  Diagnosis Date   Arthritis    Dementia (HCC)    per EMS   GERD (gastroesophageal reflux disease)    [name]   History of kidney stones    Hyperlipidemia    Macular degeneration    PVD (peripheral vascular disease) (HCC)    Stroke (HCC)    TIA      Past Surgical History:  Procedure Laterality Date   Bone turmor Left    foreheah   EYE SURGERY Bilateral    cataract removal   FRACTURE SURGERY Left    leg   pins   KNEE ARTHROPLASTY     KNEE SURGERY Left    NECK SURGERY     Tumor reomoved from head/neck in the 60s   REPLACEMENT TOTAL KNEE Left 07/14/2018   ROTATOR CUFF REPAIR Right    times 2   TOTAL KNEE ARTHROPLASTY WITH HARDWARE REMOVAL Left 02/14/2017   Procedure: Removal Left Tibia Nail, Cemented Total Knee Arthroplasty;  Surgeon: Eldred Manges, MD;  Location: [facility] OR;  Service: Orthopedics;  Laterality:  Left;   Family History:  Family History  Problem Relation Age of Onset   Stroke Neg Hx    Family Psychiatric  History: Unknown Social History:  Social History   Substance and Sexual Activity  Alcohol Use Yes   Comment: Occasional beer     Social History   Substance and Sexual Activity  Drug Use No    Social History   Socioeconomic History   Marital status: Media planner    Spouse name: Not on file   Number of children: 9   Years of education: Not on  file   Highest education level: Not on file  Occupational History   Occupation: Retired  Tobacco Use   Smoking status: Former    Packs/day: 1.50    Years: 20.00    Additional pack years: 0.00    Total pack years: 30.00    Types: Cigarettes   Smokeless tobacco: Never   Tobacco comments:    Quit 20+ year ago  Vaping Use   Vaping Use: Never used  Substance and Sexual Activity   Alcohol use: Yes    Comment: Occasional beer   Drug use: No   Sexual activity: Not Currently  Other Topics Concern   Not on file  Social History Narrative   Lives at home w/ his significant other   Right-handed   Caffeine: some coffee, 3 diet Mt Dew's per day   Social Determinants of Health   Financial Resource Strain: Low Risk  (07/23/2022)   Overall Financial Resource Strain (CARDIA)    Difficulty of Paying Living Expenses: Not hard at all  Food Insecurity: Patient Unable To Answer (09/07/2022)   Hunger Vital Sign    Worried About Running Out of Food in the Last Year: Patient unable to answer    Ran Out of Food in the Last Year: Patient unable to answer  Transportation Needs: Patient Unable To Answer (09/07/2022)   PRAPARE - Transportation    Lack of Transportation (Medical): Patient unable to answer    Lack of Transportation (Non-Medical): Patient unable to answer  Physical Activity: Insufficiently Active (07/23/2022)   Exercise Vital Sign    Days of Exercise per Week: 3 days    Minutes of Exercise per Session: 30 min  Stress: No Stress Concern Present (07/23/2022)   [employer] of Occupational Health - Occupational Stress Questionnaire    Feeling of Stress : Not at all  Social Connections: Socially Isolated (07/23/2022)   Social Connection and Isolation Panel [NHANES]    Frequency of Communication with Friends and Family: More than three times a week    Frequency of Social Gatherings with Friends and Family: Never    Attends Religious Services: Never    Database administrator or  Organizations: No    Attends Banker Meetings: Never    Marital Status: Widowed   Additional Social History:    Allergies:   Allergies  Allergen Reactions   Celebrex [Celecoxib] Other (See Comments)    Leg swelling, broke out in rash    Labs:  Results for orders placed or performed during the hospital encounter of 08/08/22 (from the past 48 hour(s))  Basic metabolic panel     Status: Abnormal   Collection Time: 10/27/22  8:41 AM  Result Value Ref Range   Sodium 137 135 - 145 mmol/L   Potassium 4.2 3.5 - 5.1 mmol/L   Chloride 107 98 - 111 mmol/L   CO2 24 22 - 32 mmol/L   Glucose,  Bld 95 70 - 99 mg/dL    Comment: Glucose reference range applies only to samples taken after fasting for at least 8 hours.   BUN 26 (H) 8 - 23 mg/dL   Creatinine, Ser 4.09 0.61 - 1.24 mg/dL   Calcium 8.4 (L) 8.9 - 10.3 mg/dL   GFR, Estimated >81 >19 mL/min    Comment: (NOTE) Calculated using the CKD-EPI Creatinine Equation (2021)    Anion gap 6 5 - 15    Comment: Performed at [facility], [address] [place], [place] [address]  Basic metabolic panel     Status: Abnormal   Collection Time: 10/28/22  4:40 AM  Result Value Ref Range   Sodium 138 135 - 145 mmol/L   Potassium 3.6 3.5 - 5.1 mmol/L   Chloride 106 98 - 111 mmol/L   CO2 27 22 - 32 mmol/L   Glucose, Bld 94 70 - 99 mg/dL    Comment: Glucose reference range applies only to samples taken after fasting for at least 8 hours.   BUN 22 8 - 23 mg/dL   Creatinine, Ser 9.56 0.61 - 1.24 mg/dL   Calcium 8.4 (L) 8.9 - 10.3 mg/dL   GFR, Estimated >21 >30 mL/min    Comment: (NOTE) Calculated using the CKD-EPI Creatinine Equation (2021)    Anion gap 5 5 - 15    Comment: Performed at [facility], [address] [place], [place] [address]  CBC     Status: Abnormal   Collection Time: 10/28/22  8:57 AM  Result Value Ref Range   WBC 4.4 4.0 - 10.5 K/uL   RBC 3.83 (L) 4.22 - 5.81 MIL/uL   Hemoglobin 11.9 (L)  13.0 - 17.0 g/dL   HCT 46.9 (L) 62.9 - 52.8 %   MCV 94.8 80.0 - 100.0 fL   MCH 31.1 26.0 - 34.0 pg   MCHC 32.8 30.0 - 36.0 g/dL   RDW 41.3 24.4 - 01.0 %   Platelets 203 150 - 400 K/uL   nRBC 0.0 0.0 - 0.2 %    Comment: Performed at [facility], [address], [place], [place] [address]  Magnesium     Status: None   Collection Time: 10/28/22  8:57 AM  Result Value Ref Range   Magnesium 2.2 1.7 - 2.4 mg/dL    Comment: Performed at [facility], [address], [place], [place] [address]  Phosphorus     Status: None   Collection Time: 10/28/22  8:57 AM  Result Value Ref Range   Phosphorus 4.2 2.5 - 4.6 mg/dL    Comment: Performed at [facility], [address] [place], [place] [address]    Current Facility-Administered Medications  Medication Dose Route Frequency Provider Last Rate Last Admin   acetaminophen (TYLENOL) tablet 650 mg  650 mg Oral Q6H PRN Abdirahman Chittum H, NP   650 mg at 10/25/22 1415   bisacodyl (DULCOLAX) suppository 10 mg  10 mg Rectal Daily PRN Gillis Santa, MD       cyanocobalamin (VITAMIN B12) tablet 1,000 mcg  1,000 mcg Oral Daily Lurene Shadow, MD   1,000 mcg at 10/28/22 0910   diazepam (VALIUM) injection 5 mg  5 mg Intramuscular Once Gillis Santa, MD       diphenhydrAMINE (BENADRYL) capsule 25 mg  25 mg Oral Q6H PRN Gillis Santa, MD   25 mg at 10/23/22 0852   divalproex (DEPAKOTE SPRINKLE) capsule 500 mg  500 mg Oral Q12H Cordella Register  A, RPH   500 mg at 10/28/22 0910   donepezil (ARICEPT) tablet 5 mg  5 mg Oral Daily Cox, Amy N, DO   5 mg at 10/28/22 0910   enoxaparin (LOVENOX) injection 40 mg  40 mg Subcutaneous Q24H Cox, Amy N, DO   40 mg at 10/27/22 2037   feeding supplement (ENSURE ENLIVE / ENSURE PLUS) liquid 237 mL  237 mL Oral BID BM Cox, Amy N, DO   237 mL at 10/28/22 0915   furosemide (LASIX) tablet 20 mg  20 mg Oral Daily Gillis Santa, MD   20 mg at 10/28/22 0915   guaiFENesin-dextromethorphan (ROBITUSSIN DM)  100-10 MG/5ML syrup 5 mL  5 mL Oral Q6H PRN Lurene Shadow, MD   5 mL at 10/02/22 0021   haloperidol (HALDOL) tablet 5 mg  5 mg Oral Q6H PRN Madueme, Elvira C, RPH   5 mg at 10/26/22 0047   Or   haloperidol lactate (HALDOL) injection 5 mg  5 mg Intramuscular Q6H PRN Madueme, Elvira C, RPH   5 mg at 10/27/22 1038   hydrALAZINE (APRESOLINE) injection 10 mg  10 mg Intravenous Q4H PRN Amin, Ankit Chirag, MD       ipratropium-albuterol (DUONEB) 0.5-2.5 (3) MG/3ML nebulizer solution 3 mL  3 mL Nebulization Q4H PRN Amin, Ankit Chirag, MD       lidocaine (LIDODERM) 5 % 1 patch  1 patch Transdermal Q24H Amin, Ankit Chirag, MD   1 patch at 10/27/22 1529   LORazepam (ATIVAN) tablet 2 mg  2 mg Oral Q6H PRN Gillis Santa, MD   2 mg at 10/28/22 0910   melatonin tablet 5 mg  5 mg Oral QHS Gillis Santa, MD   5 mg at 10/27/22 2036   metoprolol tartrate (LOPRESSOR) injection 5 mg  5 mg Intravenous Q4H PRN Amin, Ankit Chirag, MD       OLANZapine (ZYPREXA) injection 5 mg  5 mg Intramuscular Q6H PRN Gillis Santa, MD       polyethylene glycol (MIRALAX / GLYCOLAX) packet 17 g  17 g Oral Daily Amin, Ankit Chirag, MD   17 g at 10/28/22 0915   pravastatin (PRAVACHOL) tablet 40 mg  40 mg Oral QHS Cox, Amy N, DO   40 mg at 10/27/22 2035   QUEtiapine (SEROQUEL) tablet 100 mg  100 mg Oral TID Charm Rings, NP   100 mg at 10/28/22 0910   rOPINIRole (REQUIP) tablet 0.5 mg  0.5 mg Oral QHS Emric Kowalewski H, NP   0.5 mg at 10/27/22 2035   tamsulosin (FLOMAX) capsule 0.4 mg  0.4 mg Oral QPC supper Cox, Amy N, DO   0.4 mg at 10/27/22 1710   traZODone (DESYREL) tablet 150 mg  150 mg Oral QHS Amin, Ankit Chirag, MD   150 mg at 10/27/22 2036   Vitamin D (Ergocalciferol) (DRISDOL) 1.25 MG (50000 UNIT) capsule 50,000 Units  50,000 Units Oral Q7 days Gillis Santa, MD   50,000 Units at 10/21/22 0853   ziprasidone (GEODON) injection 10 mg  10 mg Intramuscular Once PRN Gillis Santa, MD        Musculoskeletal: Strength & Muscle  Tone: within normal limits Gait & Station:  Did not observe  Patient leans: N/A            Psychiatric Specialty Exam:  Presentation  General Appearance:  Disheveled  Eye Contact: None  Speech: Slurred  Speech Volume: Decreased  Handedness: Right   Mood and Affect  Mood: Labile; Anxious  Affect: Labile   Thought Process  Thought Processes: Disorganized  Descriptions of Associations:Loose  Orientation:Partial (Oriented to self only)  Thought Content:Other (comment) (Minimal conversaton with this Clinical research associate)  History of Schizophrenia/Schizoaffective disorder:No data recorded Duration of Psychotic Symptoms:No data recorded Hallucinations:No data recorded  Ideas of Reference:Other (comment) (Unablel to assess)  Suicidal Thoughts:No data recorded  Homicidal Thoughts:No data recorded   Sensorium  Memory: Immediate Poor; Recent Poor; Remote Poor  Judgment: Impaired  Insight: Lacking   Executive Functions  Concentration: Poor  Attention Span: Poor  Recall: Other (comment) (Unable to assess)  Fund of Knowledge: Other (comment) (Unable to assess)  Language: Poor   Psychomotor Activity  Psychomotor Activity:No data recorded   Assets  Assets: Financial Resources/Insurance; Social Support   Sleep  Sleep:No data recorded   Physical Exam: Physical Exam Vitals and nursing note reviewed.  HENT:     Head: Normocephalic.     Nose: Nose normal.  Pulmonary:     Effort: Pulmonary effort is normal.  Musculoskeletal:        General: Normal range of motion.     Cervical back: Normal range of motion.  Skin:    General: Skin is dry.  Neurological:     Mental Status: He is alert. He is disoriented.     Comments: Oriented to self only   Psychiatric:        Attention and Perception: He perceives auditory (Appears to be responding to internal stimuli) hallucinations. Visual hallucinations: Unable to assess.       Mood and Affect:  Affect is labile and blunt.        Speech: Tangential: Unable to assess.        Behavior: Behavior is slowed.        Cognition and Memory: Cognition is impaired. Memory is impaired. He exhibits impaired recent memory and impaired remote memory.        Judgment: Judgment is impulsive.    ROS Blood pressure 115/66, pulse 74, temperature 99 F (37.2 C), resp. rate 20, height 5\' 10"  (1.778 m), weight 78.2 kg, SpO2 98 %. Body mass index is 24.74 kg/m.  Treatment Plan Summary: 82 year-old male with dementia seen at the request of the medical team due to aggressive behaviors. Medication management and Plan :  Will add mirtazapine to aid in mood stabilization and sleep support. Quetiapine adjusted from 100 mg to 150 mg at bedtime. Recommend weekly EKGs to monitor QTc. There is no indication patient would benefit from geriatric inpatient psychiatric admission at this time. Psych team will continue to follow the patient to assess and manage any behavioral concerns.    Disposition: Patient does not meet criteria for psychiatric inpatient admission.  He is followed by Transition of Care (TOC) for SNF placement.   Norma Fredrickson, NP 10/28/2022 10:14 AM

## 2022-10-29 LAB — BASIC METABOLIC PANEL
Anion gap: 8 (ref 5–15)
BUN: 23 mg/dL (ref 8–23)
CO2: 26 mmol/L (ref 22–32)
Calcium: 9 mg/dL (ref 8.9–10.3)
Chloride: 103 mmol/L (ref 98–111)
Creatinine, Ser: 1.13 mg/dL (ref 0.61–1.24)
GFR, Estimated: >60 mL/min (ref >60)
Glucose, Bld: 90 mg/dL (ref 70–99)
Potassium: 4.4 mmol/L (ref 3.5–5.1)
Sodium: 137 mmol/L (ref 135–145)

## 2022-10-29 MED ORDER — HALOPERIDOL 5 MG PO TABS
5.0000 mg | ORAL_TABLET | Freq: Two times a day (BID) | ORAL | Status: DC
  Administered 2022-10-29 – 2022-11-05 (×9): 5 mg via ORAL
  Filled 2022-10-29 (×9): qty 1

## 2022-10-29 MED ORDER — DIVALPROEX SODIUM 125 MG PO CSDR
750.0000 mg | DELAYED_RELEASE_CAPSULE | Freq: Two times a day (BID) | ORAL | Status: DC
  Administered 2022-10-29 – 2022-11-08 (×18): 750 mg via ORAL
  Filled 2022-10-29 (×25): qty 6

## 2022-10-29 MED ORDER — ZIPRASIDONE MESYLATE 20 MG IM SOLR: 10.0000 mg | Freq: Once | INTRAMUSCULAR | Status: DC | PRN

## 2022-10-29 MED ORDER — LORAZEPAM 1 MG PO TABS
1.0000 mg | ORAL_TABLET | Freq: Three times a day (TID) | ORAL | Status: DC | PRN
  Administered 2022-10-29: 1 mg via ORAL
  Filled 2022-10-29: qty 1

## 2022-10-29 NOTE — Consult Note (Signed)
[facility] Face-to-Face Psychiatry Consult   Reason for Consult: Consult for this 82 year old man following up on his care.  Patient with dementia and agitation ongoing problem. Referring Physician: Lucianne Muss Patient Identification: Terry Sloan MRN:  [id number] Principal Diagnosis: Dementia with behavioral disturbance (HCC) Diagnosis:  Principal Problem:   Dementia with behavioral disturbance (HCC) Active Problems:   Mixed hyperlipidemia   History of TIA (transient ischemic attack)   Anxiety and depression   BPH (benign prostatic hyperplasia)   OAB (overactive bladder)   Altered mental status   Hypothermia   Restless leg   Vitamin B12 deficiency   Parainfluenza infection   Total Time spent with patient: 30 minutes  Subjective:   Terry MCDONNEL is a 82 y.o. male patient admitted with patient unresponsive.  HPI: See previous notes.  Well-known to psychiatry.  82 year old man with advanced dementia and chronic behavior problems.  Frequently agitated lashing out at staff and those around him when he is not sedated.  Reportedly once again this morning was striking out at staff.  When I came to see him he had been given some Ativan and was asleep.  Spoke with nursing and reviewed the chart.  Psychiatric service saw the patient yesterday made a couple of adjustments starting mirtazapine and increasing Seroquel.  Nursing notes that the patient is sedated easily by Ativan.  I also however suspect that the Ativan may have withdrawal problems when it wears off.  Last Depakote level was low at 49.  Otherwise really no new data except that fortunately his chemistry profile looks good.  Past Psychiatric History: Patient's only been known to since he has had advanced dementia.  Family has reported a long history of anger but no specific psychiatric diagnosis has not been identified.  Risk to Self:   Risk to Others:   Prior Inpatient Therapy:   Prior Outpatient Therapy:    Past Medical History:  Past Medical  History:  Diagnosis Date   Arthritis    Dementia (HCC)    per EMS   GERD (gastroesophageal reflux disease)    [name]   History of kidney stones    Hyperlipidemia    Macular degeneration    PVD (peripheral vascular disease) (HCC)    Stroke (HCC)    TIA      Past Surgical History:  Procedure Laterality Date   Bone turmor Left    foreheah   EYE SURGERY Bilateral    cataract removal   FRACTURE SURGERY Left    leg   pins   KNEE ARTHROPLASTY     KNEE SURGERY Left    NECK SURGERY     Tumor reomoved from head/neck in the 60s   REPLACEMENT TOTAL KNEE Left 07/14/2018   ROTATOR CUFF REPAIR Right    times 2   TOTAL KNEE ARTHROPLASTY WITH HARDWARE REMOVAL Left 02/14/2017   Procedure: Removal Left Tibia Nail, Cemented Total Knee Arthroplasty;  Surgeon: Eldred Manges, MD;  Location: [facility] OR;  Service: Orthopedics;  Laterality: Left;   Family History:  Family History  Problem Relation Age of Onset   Stroke Neg Hx    Family Psychiatric  History: None reported Social History:  Social History   Substance and Sexual Activity  Alcohol Use Yes   Comment: Occasional beer     Social History   Substance and Sexual Activity  Drug Use No    Social History   Socioeconomic History   Marital status: Media planner    Spouse name: Not on  file   Number of children: 9   Years of education: Not on file   Highest education level: Not on file  Occupational History   Occupation: Retired  Tobacco Use   Smoking status: Former    Packs/day: 1.50    Years: 20.00    Additional pack years: 0.00    Total pack years: 30.00    Types: Cigarettes   Smokeless tobacco: Never   Tobacco comments:    Quit 20+ year ago  Vaping Use   Vaping Use: Never used  Substance and Sexual Activity   Alcohol use: Yes    Comment: Occasional beer   Drug use: No   Sexual activity: Not Currently  Other Topics Concern   Not on file  Social History Narrative   Lives at home w/ his significant other    Right-handed   Caffeine: some coffee, 3 diet Mt Dew's per day   Social Determinants of Health   Financial Resource Strain: Low Risk  (07/23/2022)   Overall Financial Resource Strain (CARDIA)    Difficulty of Paying Living Expenses: Not hard at all  Food Insecurity: Patient Unable To Answer (09/07/2022)   Hunger Vital Sign    Worried About Running Out of Food in the Last Year: Patient unable to answer    Ran Out of Food in the Last Year: Patient unable to answer  Transportation Needs: Patient Unable To Answer (09/07/2022)   PRAPARE - Transportation    Lack of Transportation (Medical): Patient unable to answer    Lack of Transportation (Non-Medical): Patient unable to answer  Physical Activity: Insufficiently Active (07/23/2022)   Exercise Vital Sign    Days of Exercise per Week: 3 days    Minutes of Exercise per Session: 30 min  Stress: No Stress Concern Present (07/23/2022)   [employer] of Occupational Health - Occupational Stress Questionnaire    Feeling of Stress : Not at all  Social Connections: Socially Isolated (07/23/2022)   Social Connection and Isolation Panel [NHANES]    Frequency of Communication with Friends and Family: More than three times a week    Frequency of Social Gatherings with Friends and Family: Never    Attends Religious Services: Never    Database administrator or Organizations: No    Attends Banker Meetings: Never    Marital Status: Widowed   Additional Social History:    Allergies:   Allergies  Allergen Reactions   Celebrex [Celecoxib] Other (See Comments)    Leg swelling, broke out in rash    Labs:  Results for orders placed or performed during the hospital encounter of 08/08/22 (from the past 48 hour(s))  Basic metabolic panel     Status: Abnormal   Collection Time: 10/28/22  4:40 AM  Result Value Ref Range   Sodium 138 135 - 145 mmol/L   Potassium 3.6 3.5 - 5.1 mmol/L   Chloride 106 98 - 111 mmol/L   CO2 27 22 - 32 mmol/L    Glucose, Bld 94 70 - 99 mg/dL    Comment: Glucose reference range applies only to samples taken after fasting for at least 8 hours.   BUN 22 8 - 23 mg/dL   Creatinine, Ser 1.61 0.61 - 1.24 mg/dL   Calcium 8.4 (L) 8.9 - 10.3 mg/dL   GFR, Estimated >09 >60 mL/min    Comment: (NOTE) Calculated using the CKD-EPI Creatinine Equation (2021)    Anion gap 5 5 - 15    Comment: Performed at  [facility], [address] [place], [place] [address]  CBC     Status: Abnormal   Collection Time: 10/28/22  8:57 AM  Result Value Ref Range   WBC 4.4 4.0 - 10.5 K/uL   RBC 3.83 (L) 4.22 - 5.81 MIL/uL   Hemoglobin 11.9 (L) 13.0 - 17.0 g/dL   HCT 60.4 (L) 54.0 - 98.1 %   MCV 94.8 80.0 - 100.0 fL   MCH 31.1 26.0 - 34.0 pg   MCHC 32.8 30.0 - 36.0 g/dL   RDW 19.1 47.8 - 29.5 %   Platelets 203 150 - 400 K/uL   nRBC 0.0 0.0 - 0.2 %    Comment: Performed at [facility], [address] [place], [place] [address]  Magnesium     Status: None   Collection Time: 10/28/22  8:57 AM  Result Value Ref Range   Magnesium 2.2 1.7 - 2.4 mg/dL    Comment: Performed at [facility], [address] [place], [place] [address]  Phosphorus     Status: None   Collection Time: 10/28/22  8:57 AM  Result Value Ref Range   Phosphorus 4.2 2.5 - 4.6 mg/dL    Comment: Performed at [facility], [address] [place], [place] [address]  Valproic acid level     Status: Abnormal   Collection Time: 10/28/22  8:57 AM  Result Value Ref Range   Valproic Acid Lvl 49 (L) 50.0 - 100.0 ug/mL    Comment: Performed at [facility], [address] [place], [place] [address]  Basic metabolic panel     Status: None   Collection Time: 10/29/22  4:47 AM  Result Value Ref Range   Sodium 137 135 - 145 mmol/L   Potassium 4.4 3.5 - 5.1 mmol/L   Chloride 103 98 - 111 mmol/L   CO2 26 22 - 32 mmol/L   Glucose, Bld 90 70 - 99 mg/dL    Comment: Glucose reference range applies only to samples  taken after fasting for at least 8 hours.   BUN 23 8 - 23 mg/dL   Creatinine, Ser 1.32 0.61 - 1.24 mg/dL   Calcium 9.0 8.9 - 44.0 mg/dL   GFR, Estimated >10 >27 mL/min    Comment: (NOTE) Calculated using the CKD-EPI Creatinine Equation (2021)    Anion gap 8 5 - 15    Comment: Performed at [facility], [address] [place], [place] [address]    Current Facility-Administered Medications  Medication Dose Route Frequency Provider Last Rate Last Admin   acetaminophen (TYLENOL) tablet 650 mg  650 mg Oral Q6H PRN Bennett, Christal H, NP   650 mg at 10/25/22 1415   bisacodyl (DULCOLAX) suppository 10 mg  10 mg Rectal Daily PRN Gillis Santa, MD       cyanocobalamin (VITAMIN B12) tablet 1,000 mcg  1,000 mcg Oral Daily Lurene Shadow, MD   1,000 mcg at 10/28/22 0910   diphenhydrAMINE (BENADRYL) capsule 25 mg  25 mg Oral Q6H PRN Gillis Santa, MD   25 mg at 10/23/22 4403   divalproex (DEPAKOTE SPRINKLE) capsule 750 mg  750 mg Oral Q12H Keymora Grillot T, MD       donepezil (ARICEPT) tablet 5 mg  5 mg Oral Daily Cox, Amy N, DO   5 mg at 10/28/22 0910   enoxaparin (LOVENOX) injection 40 mg  40 mg Subcutaneous Q24H Cox, Amy N, DO   40 mg at 10/28/22 2047   feeding supplement (  ENSURE ENLIVE / ENSURE PLUS) liquid 237 mL  237 mL Oral BID BM Cox, Amy N, DO   237 mL at 10/28/22 1411   furosemide (LASIX) tablet 20 mg  20 mg Oral Daily Gillis Santa, MD   20 mg at 10/28/22 0915   guaiFENesin-dextromethorphan (ROBITUSSIN DM) 100-10 MG/5ML syrup 5 mL  5 mL Oral Q6H PRN Lurene Shadow, MD   5 mL at 10/02/22 0021   haloperidol (HALDOL) tablet 5 mg  5 mg Oral Q6H PRN Madueme, Harriet Masson, RPH   5 mg at 10/29/22 1610   Or   haloperidol lactate (HALDOL) injection 5 mg  5 mg Intramuscular Q6H PRN Madueme, Elvira C, RPH   5 mg at 10/27/22 1038   haloperidol (HALDOL) tablet 5 mg  5 mg Oral BID Jonovan Boedecker, Jackquline Denmark, MD       hydrALAZINE (APRESOLINE) injection 10 mg  10 mg Intravenous Q4H PRN Amin, Ankit Chirag, MD        ipratropium-albuterol (DUONEB) 0.5-2.5 (3) MG/3ML nebulizer solution 3 mL  3 mL Nebulization Q4H PRN Amin, Ankit Chirag, MD       lidocaine (LIDODERM) 5 % 1 patch  1 patch Transdermal Q24H Amin, Ankit Chirag, MD   1 patch at 10/27/22 1529   LORazepam (ATIVAN) tablet 1 mg  1 mg Oral Q6H PRN Bennett, Christal H, NP   1 mg at 10/29/22 0819   melatonin tablet 5 mg  5 mg Oral QHS Gillis Santa, MD   5 mg at 10/28/22 2045   metoprolol tartrate (LOPRESSOR) injection 5 mg  5 mg Intravenous Q4H PRN Amin, Ankit Chirag, MD       polyethylene glycol (MIRALAX / GLYCOLAX) packet 17 g  17 g Oral Daily Amin, Ankit Chirag, MD   17 g at 10/28/22 0915   pravastatin (PRAVACHOL) tablet 40 mg  40 mg Oral QHS Cox, Amy N, DO   40 mg at 10/28/22 2046   QUEtiapine (SEROQUEL) tablet 100 mg  100 mg Oral BH-q8a2p Bennett, Christal H, NP   100 mg at 10/29/22 0816   QUEtiapine (SEROQUEL) tablet 150 mg  150 mg Oral QHS Bennett, Christal H, NP   150 mg at 10/28/22 2046   rOPINIRole (REQUIP) tablet 0.5 mg  0.5 mg Oral QHS Bennett, Christal H, NP   0.5 mg at 10/28/22 2047   tamsulosin (FLOMAX) capsule 0.4 mg  0.4 mg Oral QPC supper Cox, Amy N, DO   0.4 mg at 10/28/22 1829   traZODone (DESYREL) tablet 150 mg  150 mg Oral QHS Amin, Ankit Chirag, MD   150 mg at 10/28/22 2045   Vitamin D (Ergocalciferol) (DRISDOL) 1.25 MG (50000 UNIT) capsule 50,000 Units  50,000 Units Oral Q7 days Gillis Santa, MD   50,000 Units at 10/21/22 0853   ziprasidone (GEODON) injection 10 mg  10 mg Intramuscular Once PRN Gillis Santa, MD        Musculoskeletal: Strength & Muscle Tone: within normal limits Gait & Station: unable to stand Patient leans: N/A            Psychiatric Specialty Exam:  Presentation  General Appearance:  Disheveled  Eye Contact: None  Speech: Slurred  Speech Volume: Decreased  Handedness: Right   Mood and Affect  Mood: Labile; Anxious  Affect: Labile   Thought Process  Thought  Processes: Disorganized  Descriptions of Associations:Loose  Orientation:Partial (Oriented to self only)  Thought Content:Other (comment) (Minimal conversaton with this Clinical research associate)  History of Schizophrenia/Schizoaffective disorder:No data recorded Duration  of Psychotic Symptoms:No data recorded Hallucinations:No data recorded Ideas of Reference:Other (comment) (Unablel to assess)  Suicidal Thoughts:No data recorded Homicidal Thoughts:No data recorded  Sensorium  Memory: Immediate Poor; Recent Poor; Remote Poor  Judgment: Impaired  Insight: Lacking   Executive Functions  Concentration: Poor  Attention Span: Poor  Recall: Other (comment) (Unable to assess)  Fund of Knowledge: Other (comment) (Unable to assess)  Language: Poor   Psychomotor Activity  Psychomotor Activity:No data recorded  Assets  Assets: Financial Resources/Insurance; Social Support   Sleep  Sleep:No data recorded  Physical Exam: Physical Exam Vitals and nursing note reviewed.  Constitutional:      Appearance: Normal appearance.  HENT:     Head: Normocephalic and atraumatic.     Mouth/Throat:     Pharynx: Oropharynx is clear.  Eyes:     Pupils: Pupils are equal, round, and reactive to light.  Cardiovascular:     Rate and Rhythm: Normal rate and regular rhythm.  Pulmonary:     Effort: Pulmonary effort is normal.     Breath sounds: Normal breath sounds.  Abdominal:     General: Abdomen is flat.     Palpations: Abdomen is soft.  Musculoskeletal:        General: Normal range of motion.  Skin:    General: Skin is warm and dry.  Neurological:     General: No focal deficit present.     Mental Status: He is alert. Mental status is at baseline.  Psychiatric:        Attention and Perception: He is inattentive.        Speech: He is noncommunicative.    Review of Systems  Unable to perform ROS: Dementia   Blood pressure 113/70, pulse 69, temperature 97.8 F (36.6 C),  temperature source Tympanic, resp. rate 19, height 5\' 10"  (1.778 m), weight 78.2 kg, SpO2 95 %. Body mass index is 24.74 kg/m.  Treatment Plan Summary: Medication management and Plan difficult to manage patient with advanced dementia frequently agitated and with dementia to the point that really there is no reasoning with him at all.  Reviewed medications.  There is no definite answer to this but a few things we can try.  Increase Depakote to 750 mg twice a day for the agitation given the recent low level.  I am leaving the Ativan as needed in place but we should keep an eye on whether that tends to wear off and lead to more agitation several hours after it is given.  In the meantime I am making the Haldol a standing dose thing doing it twice a day but instead of waiting until 10:00 in the morning giving a first dose at 7:00 and a second dose at 8:00 in the evening of 5 mg.  Leaving other PRNs in place.  I did not take it on myself to discontinue the current Geodon order but did note to the team that he seems to be sedated now even without the Geodon.  We will continue to follow-up  Disposition: Patient does not meet criteria for psychiatric inpatient admission.  Mordecai Rasmussen, MD 10/29/2022 9:38 AM

## 2022-10-29 NOTE — Progress Notes (Signed)
[facility] Progress Note  Patient: Terry Sloan    AVW:[id number]  DOA: 08/08/2022     Date of Service: the patient was seen and examined on 10/29/2022  Chief Complaint  Patient presents with   Psychiatric Evaluation   Brief hospital course: 82 year old male with history of dementia, behavioral disturbance, hyperlipidemia, BPH, insomnia, depression, anxiety, who presents to emergency department from [facility] rehab dementia department on 08/08/2022 for chief concerns of aggression with aggressive outburst towards staff. [facility]ed to [name] on 2/24 due to agitation.  Was in the ED until 3/1 as he required SNF placement.  Sent to [facility] and [name].  On 3/7 returned back to ER for confusion, combative with staff.  Was IVC in the ED.Behavioral health was consulted.  TOC was consulted for placement to SNF again. however, was declined by facilities due to need for as needed Haldol. Initially in the ED on 4/5 patient became hypotensive, hypothermic with change in mentation therefore admitted to the hospital.  Infectious workup was unremarkable.  Due to his agitation, psychiatry team was consulted. On 4/27 during hospitalization he was diagnosed with parainfluenza infection which was treated conservatively.   Currently pending safe disposition   Assessment and Plan:  Agitation, Dementia with behavioral disturbances. Appears to be calm this morning.  Currently on Depakote, Seroquel and Aricept.  Intermittent significant agitation.  Prn Haldol and/or Zyprexa. Seen by psychiatry Continue bedtime trazodone 150 mg  5/26 again patient is having behavioral issues, psych was reengaged. S/p Ativan 2 mg po x one dose given Patient was reevaluated by psych, started Remeron 15 mg nightly mood stabilizer, increased Seroquel 150 mg nightly 5/28 increased Depakote 750 mg p.o. twice daily, started Haldol 5 mg p.o. twice daily scheduled Avoid using Ativan as needed due to withdrawal  effects Geodon 10 mg IM once as needed dose order placed in case of an emergency if we need to use it for the safety of patient and the staff Psych help appreciated.   Parainfluenza infection. Treated conservatively.  Diagnosed on 4/27.   Hypotension, hypothermia. Resolved, no obvious infection   Recurrent bilateral lower extremity edema. Lower extremity duplex were negative.  5/25 significant edema, right >left 5/25 Lasix 20 mg p.o. daily for 3 days Monitor renal functions and urine output Patient was advised to keep legs elevated, and use stockings if possible. 5/26 lower extremity edema improved, venous duplex repeated negative for DVT   Restless leg. Continue Requip at nighttime.   Anxiety and depression. -On Depakote and Seroquel   Vitamin B12 and D deficiency. On oral supplements.    BPH. Continuing Flomax.   Fecal incontinence secondary to constipation Bowel regimen   Body mass index is 24.74 kg/m.  Interventions:  Diet: Regular diet DVT Prophylaxis: Subcutaneous Lovenox   Advance goals of care discussion: Full code  Family Communication: family was not present at bedside, at the time of interview.  The pt provided permission to discuss medical plan with the family. Opportunity was given to ask question and all questions were answered satisfactorily.   Disposition:  Pt is from  [facility] in dementia unit, still has behavioral issue, pending determination of guardian and placement, which precludes a safe discharge. Discharge to dementia unit, when bed will be available, awaiting for guardianship.  TOC is following for placement   Subjective: No significant events overnight, patient is sleeping most the time, in the morning time he woke up and had intermittent agitation but he is under control  now. Patient was seen by psych and medication has been adjusted as above.    Physical Exam: General: NAD, sleepy, resting comfortably. Appear  in no distress, Eyes: Closed, sleepy ENT: Oral Mucosa Clear, moist  Neck: no JVD,  Cardiovascular: S1 and S2 Present, no Murmur,  Respiratory: good respiratory effort, Bilateral Air entry equal and Decreased, no Crackles, no wheezes Abdomen: Bowel Sound present, Soft and no tenderness,  Skin: no rashes Extremities: mild 2+ Pedal edema, no calf tenderness Neurologic: without any new focal findings Gait not checked due to patient safety concerns  Vitals:   10/27/22 0700 10/27/22 1530 10/28/22 0750 10/29/22 0719  BP: 132/71 137/74 115/66 113/70  Pulse: 80 79 74 69  Resp: 16 18 20 19   Temp: 99.2 F (37.3 C) 98 F (36.7 C) 99 F (37.2 C) 97.8 F (36.6 C)  TempSrc: Axillary Oral  Tympanic  SpO2: 97% 98% 98% 95%  Weight:      Height:        Intake/Output Summary (Last 24 hours) at 10/29/2022 1447 Last data filed at 10/29/2022 0900 Gross per 24 hour  Intake 240 ml  Output --  Net 240 ml   Filed Weights   08/08/22 1205 08/14/22 0038 09/07/22 0057  Weight: 81 kg 78.2 kg 78.2 kg    Data Reviewed: I have personally reviewed and interpreted daily labs, tele strips, imagings as discussed above. I reviewed all nursing notes, pharmacy notes, vitals, pertinent old records I have discussed plan of care as described above with RN and patient/family.  CBC: Recent Labs  Lab 10/28/22 0857  WBC 4.4  HGB 11.9*  HCT 36.3*  MCV 94.8  PLT 203    Basic Metabolic Panel: Recent Labs  Lab 10/23/22 0806 10/26/22 0448 10/27/22 0841 10/28/22 0440 10/28/22 0857 10/29/22 0447  NA 138 141 137 138  --  137  K 4.5 3.7 4.2 3.6  --  4.4  CL 107 108 107 106  --  103  CO2 22 28 24 27   --  26  GLUCOSE 83 103* 95 94  --  90  BUN 30* 31* 26* 22  --  23  CREATININE 1.21 1.26* 1.17 1.10  --  1.13  CALCIUM 8.4* 8.4* 8.4* 8.4*  --  9.0  MG 2.2 2.2  --   --  2.2  --   PHOS  --   --   --   --  4.2  --     Studies: No results found.  Scheduled Meds:  vitamin B-12  1,000 mcg Oral Daily    divalproex  750 mg Oral Q12H   donepezil  5 mg Oral Daily   enoxaparin (LOVENOX) injection  40 mg Subcutaneous Q24H   feeding supplement  237 mL Oral BID BM   haloperidol  5 mg Oral BID   lidocaine  1 patch Transdermal Q24H   melatonin  5 mg Oral QHS   polyethylene glycol  17 g Oral Daily   pravastatin  40 mg Oral QHS   QUEtiapine  100 mg Oral BH-q8a2p   QUEtiapine  150 mg Oral QHS   rOPINIRole  0.5 mg Oral QHS   tamsulosin  0.4 mg Oral QPC supper   traZODone  150 mg Oral QHS   Vitamin D (Ergocalciferol)  50,000 Units Oral Q7 days   Continuous Infusions: PRN Meds: acetaminophen, bisacodyl, diphenhydrAMINE, guaiFENesin-dextromethorphan, haloperidol **OR** haloperidol lactate, hydrALAZINE, ipratropium-albuterol, LORazepam, metoprolol tartrate, ziprasidone  Time spent: 35 minutes  Author: Gillis Santa. MD [facility] 10/29/2022 2:47 PM  To reach On-call, see care teams to locate the attending and reach out to them via [email or web address]. If 7PM-7AM, please contact night-coverage If you still have difficulty reaching the attending provider, please page the [facility] (Director on Call) for [facility] on amion for assistance.

## 2022-10-30 LAB — BASIC METABOLIC PANEL
Anion gap: 8 (ref 5–15)
BUN: 23 mg/dL (ref 8–23)
CO2: 25 mmol/L (ref 22–32)
Calcium: 9.1 mg/dL (ref 8.9–10.3)
Chloride: 105 mmol/L (ref 98–111)
Creatinine, Ser: 1.09 mg/dL (ref 0.61–1.24)
GFR, Estimated: >60 mL/min (ref >60)
Glucose, Bld: 103 mg/dL — ABNORMAL HIGH (ref 70–99)
Potassium: 4.1 mmol/L (ref 3.5–5.1)
Sodium: 138 mmol/L (ref 135–145)

## 2022-10-30 LAB — CBC
HCT: 39.2 % (ref 39.0–52.0)
Hemoglobin: 13 g/dL (ref 13.0–17.0)
MCH: 30.6 pg (ref 26.0–34.0)
MCHC: 33.2 g/dL (ref 30.0–36.0)
MCV: 92.2 fL (ref 80.0–100.0)
Platelets: 218 10*3/uL (ref 150–400)
RBC: 4.25 MIL/uL (ref 4.22–5.81)
RDW: 12.4 % (ref 11.5–15.5)
WBC: 6.1 10*3/uL (ref 4.0–10.5)
nRBC: 0 % (ref 0.0–0.2)

## 2022-10-30 LAB — PHOSPHORUS: Phosphorus: 4.6 mg/dL (ref 2.5–4.6)

## 2022-10-30 LAB — MAGNESIUM: Magnesium: 2.1 mg/dL (ref 1.7–2.4)

## 2022-10-30 MED ORDER — LORAZEPAM 0.5 MG PO TABS
0.5000 mg | ORAL_TABLET | Freq: Three times a day (TID) | ORAL | Status: DC | PRN
  Administered 2022-10-30: 0.5 mg via ORAL
  Filled 2022-10-30: qty 1

## 2022-10-30 MED ORDER — LORAZEPAM 2 MG PO TABS: 2.0000 mg | ORAL_TABLET | Freq: Four times a day (QID) | ORAL | Status: DC | PRN

## 2022-10-30 NOTE — Progress Notes (Signed)
[facility] Progress Note  Patient: Terry Sloan    ZOX:[id number]  DOA: 08/08/2022     Date of Service: the patient was seen and examined on 10/30/2022  Chief Complaint  Patient presents with   Psychiatric Evaluation   Brief hospital course: 82 year old male with history of dementia, behavioral disturbance, hyperlipidemia, BPH, insomnia, depression, anxiety, who presents to emergency department from [facility] rehab dementia department on 08/08/2022 for chief concerns of aggression with aggressive outburst towards staff. Presented to [name] ED on 2/24 due to agitation.  Was in the ED until 3/1 as he required SNF placement.  Sent to [facility] and [name].  On 3/7 returned back to ER for confusion, combative with staff.  Was IVC in the ED.Behavioral health was consulted.  TOC was consulted for placement to SNF again. however, was declined by facilities due to need for as needed Haldol. Initially in the ED on 4/5 patient became hypotensive, hypothermic with change in mentation therefore admitted to the hospital.  Infectious workup was unremarkable.  Due to his agitation, psychiatry team was consulted. On 4/27 during hospitalization he was diagnosed with parainfluenza infection which was treated conservatively.   Currently pending safe disposition   Assessment and Plan:  Agitation, Dementia with behavioral disturbances. Appears to be calm this morning.  Currently on Depakote, Seroquel and Aricept.  Intermittent significant agitation.  Prn Haldol and/or Zyprexa. Seen by psychiatry Continue bedtime trazodone 150 mg  5/26 again patient is having behavioral issues, psych was reengaged. S/p Ativan 2 mg po x one dose given Patient was reevaluated by psych, started Remeron 15 mg nightly mood stabilizer, increased Seroquel 150 mg nightly 5/28 increased Depakote 750 mg p.o. twice daily, started Haldol 5 mg p.o. twice daily scheduled Avoid using Ativan as needed due to withdrawal  effects Geodon 10 mg IM once as needed dose order placed in case of an emergency if we need to use it for the safety of patient and the staff Psych help appreciated.   Parainfluenza infection. Treated conservatively.  Diagnosed on 4/27.   Hypotension, hypothermia. Resolved, no obvious infection   Recurrent bilateral lower extremity edema. Lower extremity duplex were negative.  5/25 significant edema, right >left 5/25 Lasix 20 mg p.o. daily for 3 days Monitor renal functions and urine output Patient was advised to keep legs elevated, and use stockings if possible. 5/26 lower extremity edema improved, venous duplex repeated negative for DVT   Restless leg. Continue Requip at nighttime.   Anxiety and depression. -On Depakote and Seroquel   Vitamin B12 and D deficiency. On oral supplements.    BPH. Continuing Flomax.   Fecal incontinence secondary to constipation Bowel regimen   Body mass index is 24.74 kg/m.  Interventions:  Diet: Regular diet DVT Prophylaxis: Subcutaneous Lovenox   Advance goals of care discussion: Full code  Family Communication: family was not present at bedside, at the time of interview.  The pt provided permission to discuss medical plan with the family. Opportunity was given to ask question and all questions were answered satisfactorily.   Disposition:  Pt is from  [place] nursing facility in dementia unit, still has behavioral issue, pending determination of guardian and placement, which precludes a safe discharge. Discharge to dementia unit, when bed will be available, awaiting for guardianship.  TOC is following for placement   Subjective: No significant events overnight, patient is still getting intermittent agitation as per RN, patient was sleepy during my exam.  Unable to offer any complaints.  Physical Exam: General: NAD, sleepy, resting comfortably. Appear in no distress, Eyes: Closed, sleepy ENT: Oral Mucosa Clear, moist   Neck: no JVD,  Cardiovascular: S1 and S2 Present, no Murmur,  Respiratory: good respiratory effort, Bilateral Air entry equal and Decreased, no Crackles, no wheezes Abdomen: Bowel Sound present, Soft and no tenderness,  Skin: no rashes Extremities: mild 1-2+ Pedal edema, no calf tenderness Neurologic: without any new focal findings Gait not checked due to patient safety concerns  Vitals:   10/29/22 0719 10/29/22 1609 10/29/22 2052 10/30/22 0842  BP: 113/70 121/74 (!) 140/91 118/83  Pulse: 69 83 92 83  Resp: 19 16 20 20   Temp: 97.8 F (36.6 C) (!) 97.5 F (36.4 C) 98 F (36.7 C) 97.6 F (36.4 C)  TempSrc: Tympanic Oral  Oral  SpO2: 95% 100% 99% 99%  Weight:      Height:        Intake/Output Summary (Last 24 hours) at 10/30/2022 1451 Last data filed at 10/30/2022 6578 Gross per 24 hour  Intake --  Output 300 ml  Net -300 ml   Filed Weights   08/08/22 1205 08/14/22 0038 09/07/22 0057  Weight: 81 kg 78.2 kg 78.2 kg    Data Reviewed: I have personally reviewed and interpreted daily labs, tele strips, imagings as discussed above. I reviewed all nursing notes, pharmacy notes, vitals, pertinent old records I have discussed plan of care as described above with RN and patient/family.  CBC: Recent Labs  Lab 10/28/22 0857 10/30/22 0951  WBC 4.4 6.1  HGB 11.9* 13.0  HCT 36.3* 39.2  MCV 94.8 92.2  PLT 203 218    Basic Metabolic Panel: Recent Labs  Lab 10/26/22 0448 10/27/22 0841 10/28/22 0440 10/28/22 0857 10/29/22 0447 10/30/22 0419 10/30/22 0951  NA 141 137 138  --  137 138  --   K 3.7 4.2 3.6  --  4.4 4.1  --   CL 108 107 106  --  103 105  --   CO2 28 24 27   --  26 25  --   GLUCOSE 103* 95 94  --  90 103*  --   BUN 31* 26* 22  --  23 23  --   CREATININE 1.26* 1.17 1.10  --  1.13 1.09  --   CALCIUM 8.4* 8.4* 8.4*  --  9.0 9.1  --   MG 2.2  --   --  2.2  --   --  2.1  PHOS  --   --   --  4.2  --   --  4.6    Studies: No results found.  Scheduled  Meds:  vitamin B-12  1,000 mcg Oral Daily   divalproex  750 mg Oral Q12H   donepezil  5 mg Oral Daily   enoxaparin (LOVENOX) injection  40 mg Subcutaneous Q24H   feeding supplement  237 mL Oral BID BM   haloperidol  5 mg Oral BID   lidocaine  1 patch Transdermal Q24H   melatonin  5 mg Oral QHS   polyethylene glycol  17 g Oral Daily   pravastatin  40 mg Oral QHS   QUEtiapine  100 mg Oral BH-q8a2p   QUEtiapine  150 mg Oral QHS   rOPINIRole  0.5 mg Oral QHS   tamsulosin  0.4 mg Oral QPC supper   traZODone  150 mg Oral QHS   Vitamin D (Ergocalciferol)  50,000 Units Oral Q7 days   Continuous Infusions: PRN Meds: acetaminophen, bisacodyl,  diphenhydrAMINE, guaiFENesin-dextromethorphan, haloperidol **OR** haloperidol lactate, hydrALAZINE, ipratropium-albuterol, LORazepam, metoprolol tartrate, ziprasidone  Time spent: 35 minutes  Author: Gillis Santa. MD [facility] 10/30/2022 2:51 PM  To reach On-call, see care teams to locate the attending and reach out to them via [email or web address]. If 7PM-7AM, please contact night-coverage If you still have difficulty reaching the attending provider, please page the [facility] (Director on Call) for [facility] on amion for assistance.

## 2022-10-31 LAB — BASIC METABOLIC PANEL
Anion gap: 9 (ref 5–15)
BUN: 25 mg/dL — ABNORMAL HIGH (ref 8–23)
CO2: 25 mmol/L (ref 22–32)
Calcium: 9 mg/dL (ref 8.9–10.3)
Chloride: 104 mmol/L (ref 98–111)
Creatinine, Ser: 1.06 mg/dL (ref 0.61–1.24)
GFR, Estimated: >60 mL/min (ref >60)
Glucose, Bld: 96 mg/dL (ref 70–99)
Potassium: 4.3 mmol/L (ref 3.5–5.1)
Sodium: 138 mmol/L (ref 135–145)

## 2022-10-31 NOTE — Progress Notes (Signed)
[facility] Progress Note  Patient: Terry Sloan    ZOX:[id number]  DOA: 08/08/2022     Date of Service: the patient was seen and examined on 10/31/2022  Chief Complaint  Patient presents with   Psychiatric Evaluation   Brief hospital course: 82 year old male with history of dementia, behavioral disturbance, hyperlipidemia, BPH, insomnia, depression, anxiety, who presents to emergency department from [facility] rehab dementia department on 08/08/2022 for chief concerns of aggression with aggressive outburst towards staff. Presented to [name] ED on 2/24 due to agitation.  Was in the ED until 3/1 as he required SNF placement.  Sent to [facility] and [name].  On 3/7 returned back to ER for confusion, combative with staff.  Was IVC in the ED.Behavioral health was consulted.  TOC was consulted for placement to SNF again. however, was declined by facilities due to need for as needed Haldol. Initially in the ED on 4/5 patient became hypotensive, hypothermic with change in mentation therefore admitted to the hospital.  Infectious workup was unremarkable.  Due to his agitation, psychiatry team was consulted. On 4/27 during hospitalization he was diagnosed with parainfluenza infection which was treated conservatively.   Currently pending safe disposition   Assessment and Plan:  Agitation, Dementia with behavioral disturbances. Appears to be calm this morning.  Currently on Depakote, Seroquel and Aricept.  Intermittent significant agitation.  Prn Haldol and/or Zyprexa. Seen by psychiatry Continue bedtime trazodone 150 mg  5/26 again patient is having behavioral issues, psych was reengaged. S/p Ativan 2 mg po x one dose given Patient was reevaluated by psych, started Remeron 15 mg nightly mood stabilizer, increased Seroquel 150 mg nightly 5/28 increased Depakote 750 mg p.o. twice daily, started Haldol 5 mg p.o. twice daily scheduled Avoid using Ativan as needed due to withdrawal  effects Geodon 10 mg IM once as needed dose order placed in case of an emergency if we need to use it for the safety of patient and the staff Psych help appreciated.   Parainfluenza infection. Treated conservatively.  Diagnosed on 4/27.   Hypotension, hypothermia. Resolved, no obvious infection   Recurrent bilateral lower extremity edema. Lower extremity duplex were negative.  5/25 significant edema, right >left 5/25 Lasix 20 mg p.o. daily for 3 days Monitor renal functions and urine output Patient was advised to keep legs elevated, and use stockings if possible. 5/26 lower extremity edema improved, venous duplex repeated negative for DVT   Restless leg. Continue Requip at nighttime.   Anxiety and depression. -On Depakote and Seroquel   Vitamin B12 and D deficiency. On oral supplements.    BPH. Continuing Flomax.   Fecal incontinence secondary to constipation Bowel regimen   Body mass index is 24.74 kg/m.  Interventions:  Diet: Regular diet DVT Prophylaxis: Subcutaneous Lovenox   Advance goals of care discussion: Full code  Family Communication: family was not present at bedside, at the time of interview.  The pt provided permission to discuss medical plan with the family. Opportunity was given to ask question and all questions were answered satisfactorily.   Disposition:  Pt is from  [facility] nursing facility in dementia unit, still has behavioral issue, pending determination of guardian and placement, which precludes a safe discharge. Discharge to dementia unit, when bed will be available, awaiting for guardianship.  TOC is following for placement   Subjective: No significant events overnight, patient was sleepy, unable to offer any complaints.  Still seems to be very confused, patient was wearing mittens.   Physical  Exam: General: NAD, sleepy, resting comfortably. Appear in no distress, Eyes: Closed, sleepy ENT: sleepy, mouth closed Neck: no JVD,   Cardiovascular: S1 and S2 Present, no Murmur,  Respiratory: good respiratory effort, Bilateral Air entry equal and Decreased, no Crackles, no wheezes Abdomen: Bowel Sound present, Soft and no tenderness,  Skin: no rashes Extremities: mild 1-2+ Pedal edema, no calf tenderness Neurologic: without any new focal findings Gait not checked due to patient safety concerns  Vitals:   10/30/22 1550 10/30/22 1940 10/31/22 0500 10/31/22 0744  BP: 131/66 (!) 138/123 130/68 123/71  Pulse: 79 87 83 84  Resp: 18 18 18 18   Temp: 98 F (36.7 C) 97.6 F (36.4 C) 98.3 F (36.8 C) 98.5 F (36.9 C)  TempSrc:  Oral Axillary Oral  SpO2: 100% (!) 81%  100%  Weight:      Height:        Intake/Output Summary (Last 24 hours) at 10/31/2022 1426 Last data filed at 10/30/2022 2211 Gross per 24 hour  Intake --  Output 600 ml  Net -600 ml   Filed Weights   08/08/22 1205 08/14/22 0038 09/07/22 0057  Weight: 81 kg 78.2 kg 78.2 kg    Data Reviewed: I have personally reviewed and interpreted daily labs, tele strips, imagings as discussed above. I reviewed all nursing notes, pharmacy notes, vitals, pertinent old records I have discussed plan of care as described above with RN and patient/family.  CBC: Recent Labs  Lab 10/28/22 0857 10/30/22 0951  WBC 4.4 6.1  HGB 11.9* 13.0  HCT 36.3* 39.2  MCV 94.8 92.2  PLT 203 218    Basic Metabolic Panel: Recent Labs  Lab 10/26/22 0448 10/27/22 0841 10/28/22 0440 10/28/22 0857 10/29/22 0447 10/30/22 0419 10/30/22 0951 10/31/22 0410  NA 141 137 138  --  137 138  --  138  K 3.7 4.2 3.6  --  4.4 4.1  --  4.3  CL 108 107 106  --  103 105  --  104  CO2 28 24 27   --  26 25  --  25  GLUCOSE 103* 95 94  --  90 103*  --  96  BUN 31* 26* 22  --  23 23  --  25*  CREATININE 1.26* 1.17 1.10  --  1.13 1.09  --  1.06  CALCIUM 8.4* 8.4* 8.4*  --  9.0 9.1  --  9.0  MG 2.2  --   --  2.2  --   --  2.1  --   PHOS  --   --   --  4.2  --   --  4.6  --      Studies: No results found.  Scheduled Meds:  vitamin B-12  1,000 mcg Oral Daily   divalproex  750 mg Oral Q12H   donepezil  5 mg Oral Daily   enoxaparin (LOVENOX) injection  40 mg Subcutaneous Q24H   feeding supplement  237 mL Oral BID BM   haloperidol  5 mg Oral BID   lidocaine  1 patch Transdermal Q24H   melatonin  5 mg Oral QHS   polyethylene glycol  17 g Oral Daily   pravastatin  40 mg Oral QHS   QUEtiapine  100 mg Oral BH-q8a2p   QUEtiapine  150 mg Oral QHS   rOPINIRole  0.5 mg Oral QHS   tamsulosin  0.4 mg Oral QPC supper   traZODone  150 mg Oral QHS   Vitamin D (Ergocalciferol)  50,000 Units Oral Q7 days   Continuous Infusions: PRN Meds: acetaminophen, bisacodyl, diphenhydrAMINE, guaiFENesin-dextromethorphan, haloperidol **OR** haloperidol lactate, hydrALAZINE, ipratropium-albuterol, LORazepam, metoprolol tartrate, ziprasidone  Time spent: 35 minutes  Author: Gillis Santa. MD [facility] 10/31/2022 2:26 PM  To reach On-call, see care teams to locate the attending and reach out to them via [email or web address]. If 7PM-7AM, please contact night-coverage If you still have difficulty reaching the attending provider, please page the [facility] (Director on Call) for [facility] on amion for assistance.

## 2022-11-01 LAB — CBC
HCT: 40 % (ref 39.0–52.0)
Hemoglobin: 13.4 g/dL (ref 13.0–17.0)
MCH: 31.2 pg (ref 26.0–34.0)
MCHC: 33.5 g/dL (ref 30.0–36.0)
MCV: 93 fL (ref 80.0–100.0)
Platelets: 218 10*3/uL (ref 150–400)
RBC: 4.3 MIL/uL (ref 4.22–5.81)
RDW: 12.8 % (ref 11.5–15.5)
WBC: 8.4 10*3/uL (ref 4.0–10.5)
nRBC: 0 % (ref 0.0–0.2)

## 2022-11-01 LAB — MAGNESIUM: Magnesium: 2.2 mg/dL (ref 1.7–2.4)

## 2022-11-01 LAB — PHOSPHORUS: Phosphorus: 4.6 mg/dL (ref 2.5–4.6)

## 2022-11-01 NOTE — Progress Notes (Signed)
[facility] Progress Note  Patient: Terry Sloan    ZOX:[id number]  DOA: 08/08/2022     Date of Service: the patient was seen and examined on 11/01/2022  Chief Complaint  Patient presents with   Psychiatric Evaluation   Brief hospital course: 82 year old male with history of dementia, behavioral disturbance, hyperlipidemia, BPH, insomnia, depression, anxiety, who presents to emergency department from [facility] rehab dementia department on 08/08/2022 for chief concerns of aggression with aggressive outburst towards staff. Presented to [name] on 2/24 due to agitation.  Was in the ED until 3/1 as he required SNF placement.  Sent to [place] [facility] and [name].  On 3/7 returned back to ER for confusion, combative with staff.  Was IVC in the ED.Behavioral health was consulted.  TOC was consulted for placement to SNF again. however, was declined by facilities due to need for as needed Haldol. Initially in the ED on 4/5 patient became hypotensive, hypothermic with change in mentation therefore admitted to the hospital.  Infectious workup was unremarkable.  Due to his agitation, psychiatry team was consulted. On 4/27 during hospitalization he was diagnosed with parainfluenza infection which was treated conservatively.   Currently pending safe disposition   Assessment and Plan:  Agitation, Dementia with behavioral disturbances. Appears to be calm this morning.  Currently on Depakote, Seroquel and Aricept.  Intermittent significant agitation.  Prn Haldol and/or Zyprexa. Seen by psychiatry Continue bedtime trazodone 150 mg  5/26 again patient is having behavioral issues, psych was reengaged. S/p Ativan 2 mg po x one dose given Patient was reevaluated by psych, started Remeron 15 mg nightly mood stabilizer, increased Seroquel 150 mg nightly 5/28 increased Depakote 750 mg p.o. twice daily, started Haldol 5 mg p.o. twice daily scheduled Avoid using Ativan as needed due to withdrawal  effects  Guardianship Per TOC DSS is involved and pursuing guardianship, they are responsible for making medical decisions for the patient  Parainfluenza infection. Treated conservatively.  Diagnosed on 4/27.   Hypotension, hypothermia. Resolved, no obvious infection   Recurrent bilateral lower extremity edema. Lower extremity duplex were negative.  5/25 significant edema, right >left 5/25 Lasix 20 mg p.o. daily for 3 days Monitor renal functions and urine output Patient was advised to keep legs elevated, and use stockings if possible. 5/26 lower extremity edema improved, venous duplex repeated negative for DVT   Restless leg. Continue Requip at nighttime.   Anxiety and depression. -On Depakote and Seroquel   Vitamin B12 and D deficiency. On oral supplements.    BPH. Continuing Flomax.   Fecal incontinence secondary to constipation Bowel regimen   Body mass index is 24.74 kg/m.  Interventions:  Diet: Regular diet DVT Prophylaxis: Subcutaneous Lovenox   Advance goals of care discussion: Full code  Family Communication: none at bedside. Per TOC wife has memory problems and is un Disposition:  Pt is from  [place] nursing facility in dementia unit, still has behavioral issue, pending determination of guardian and placement, which precludes a safe discharge. Discharge to dementia unit, when bed will be available, awaiting for guardianship.  TOC is following for placement   Subjective: sleeping peacefully   Physical Exam: General: NAD, asleep Appear in no distress, ENT: mmm Neck: no JVD,  Cardiovascular: S1 and S2 Present, no Murmur,  Respiratory: good respiratory effort, Bilateral Air entry equal and Decreased, no Crackles, no wheezes Abdomen: Bowel Sound present, Soft and no tenderness,  Skin: no rashes Extremities: trace LE edema Neurologic: not assessed   Vitals:  10/31/22 0744 10/31/22 1552 10/31/22 1959 11/01/22 0742  BP: 123/71 (!) 113/50 (!)  147/79 121/87  Pulse: 84 88 95 87  Resp: 18 18 18 19   Temp: 98.5 F (36.9 C) 98.4 F (36.9 C) 98.6 F (37 C) (!) 97.5 F (36.4 C)  TempSrc: Oral Oral Oral Oral  SpO2: 100% 99% 97% 100%  Weight:      Height:        Intake/Output Summary (Last 24 hours) at 11/01/2022 1522 Last data filed at 11/01/2022 0900 Gross per 24 hour  Intake --  Output 801 ml  Net -801 ml   Filed Weights   08/08/22 1205 08/14/22 0038 09/07/22 0057  Weight: 81 kg 78.2 kg 78.2 kg    Data Reviewed: I have personally reviewed and interpreted daily labs, tele strips, imagings as discussed above. I reviewed all nursing notes, pharmacy notes, vitals, pertinent old records I have discussed plan of care as described above with RN and patient/family.  CBC: Recent Labs  Lab 10/28/22 0857 10/30/22 0951 11/01/22 0959  WBC 4.4 6.1 8.4  HGB 11.9* 13.0 13.4  HCT 36.3* 39.2 40.0  MCV 94.8 92.2 93.0  PLT 203 218 218    Basic Metabolic Panel: Recent Labs  Lab 10/26/22 0448 10/27/22 0841 10/28/22 0440 10/28/22 0857 10/29/22 0447 10/30/22 0419 10/30/22 0951 10/31/22 0410 11/01/22 0959  NA 141 137 138  --  137 138  --  138  --   K 3.7 4.2 3.6  --  4.4 4.1  --  4.3  --   CL 108 107 106  --  103 105  --  104  --   CO2 28 24 27   --  26 25  --  25  --   GLUCOSE 103* 95 94  --  90 103*  --  96  --   BUN 31* 26* 22  --  23 23  --  25*  --   CREATININE 1.26* 1.17 1.10  --  1.13 1.09  --  1.06  --   CALCIUM 8.4* 8.4* 8.4*  --  9.0 9.1  --  9.0  --   MG 2.2  --   --  2.2  --   --  2.1  --  2.2  PHOS  --   --   --  4.2  --   --  4.6  --  4.6    Studies: No results found.  Scheduled Meds:  vitamin B-12  1,000 mcg Oral Daily   divalproex  750 mg Oral Q12H   donepezil  5 mg Oral Daily   enoxaparin (LOVENOX) injection  40 mg Subcutaneous Q24H   feeding supplement  237 mL Oral BID BM   haloperidol  5 mg Oral BID   lidocaine  1 patch Transdermal Q24H   melatonin  5 mg Oral QHS   polyethylene glycol  17 g  Oral Daily   pravastatin  40 mg Oral QHS   QUEtiapine  100 mg Oral BH-q8a2p   QUEtiapine  150 mg Oral QHS   rOPINIRole  0.5 mg Oral QHS   tamsulosin  0.4 mg Oral QPC supper   traZODone  150 mg Oral QHS   Vitamin D (Ergocalciferol)  50,000 Units Oral Q7 days   Continuous Infusions: PRN Meds: acetaminophen, bisacodyl, diphenhydrAMINE, guaiFENesin-dextromethorphan, haloperidol **OR** haloperidol lactate, hydrALAZINE, ipratropium-albuterol, LORazepam, metoprolol tartrate, ziprasidone  Time spent: 35 minutes  Author: Shonna Chock, MD [facility] 11/01/2022 3:22 PM  To reach On-call, see  care teams to locate the attending and reach out to them via [email or web address]. If 7PM-7AM, please contact night-coverage If you still have difficulty reaching the attending provider, please page the [facility] (Director on Call) for [facility] on amion for assistance.

## 2022-11-01 NOTE — Progress Notes (Addendum)
MD aware of daughter concerns for sedation and need for goals of care conversation. Perlie Mayo, RN

## 2022-11-01 NOTE — Consult Note (Signed)
Client visited earlier and he was fast asleep.  The medications seems to be effective that the psychiatrist ordered yesterday.  Psych will continue to follow.  Nanine Means, PMHNP

## 2022-11-01 NOTE — TOC Progression Note (Addendum)
Transition of Care [facility]) - Progression Note    Patient Details  Name: Terry Sloan MRN: [id number] Date of Birth: 1940/11/11  Transition of [facility] CM/SW Contact  Darleene Cleaver, [place] Phone Number: 11/01/2022, 8:48 PM  Clinical Narrative:     CSW informed attending physician that DSS is pursuing guardianship, that is incorrect.    For clarification purposes, patient's daughter [name] [phone] is the decision maker for patient not DSS.    [facility] DSS is working on trying to get patient approved for [employer].  CSW still unable to find a memory care facility that will accept Medicaid and be able to handle his behaviors.  TOC to continue to follow patient's progress throughout discharge planning.   Expected Discharge Plan:  (TBD) Barriers to Discharge: Continued Medical Work up  Expected Discharge Plan and Services  Memory care facility once bed is available.                                             Social Determinants of Health (SDOH) Interventions SDOH Screenings   Food Insecurity: Patient Unable To Answer (09/07/2022)  Housing: Low Risk  (09/07/2022)  Transportation Needs: Patient Unable To Answer (09/07/2022)  Utilities: Not At Risk (07/23/2022)  Alcohol Screen: Low Risk  (07/23/2022)  Depression (PHQ2-9): Low Risk  (07/23/2022)  Recent Concern: Depression (PHQ2-9) - Medium Risk (06/17/2022)  Financial Resource Strain: Low Risk  (07/23/2022)  Physical Activity: Insufficiently Active (07/23/2022)  Social Connections: Socially Isolated (07/23/2022)  Stress: No Stress Concern Present (07/23/2022)  Tobacco Use: Medium Risk (09/11/2022)    Readmission Risk Interventions     No data to display

## 2022-11-02 MED ORDER — QUETIAPINE FUMARATE 25 MG PO TABS
50.0000 mg | ORAL_TABLET | ORAL | Status: DC
  Administered 2022-11-03 – 2022-11-04 (×3): 50 mg via ORAL
  Filled 2022-11-02 (×3): qty 2

## 2022-11-02 NOTE — Consult Note (Signed)
Client continues to be somnolent on assessment this morning, decreased Seroquel to assist with this.  Psych will continue to monitor.  Nanine Means, PMHNP

## 2022-11-02 NOTE — Progress Notes (Signed)
PROGRESS NOTE    Terry Sloan  ZOX:[id number] DOB: 09/27/1940 DOA: 08/08/2022 PCP: Pcp, No   Brief Narrative:  This 82 year old male with history of dementia, behavioral disturbance, hyperlipidemia, BPH, insomnia, depression, anxiety, who presents to emergency department from [facility] rehab dementia department on 08/08/2022 for chief concerns of aggression with aggressive outburst towards staff. Presented to [name] ED on 2/24 due to agitation.  Was in the ED until 3/1 as he required SNF placement.  Sent to [facility] and [name].  On 3/7 returned back to ER for confusion, combative with staff.  Was IVC in the ED. Behavioral health was consulted.  TOC was consulted for placement to SNF again. however, was declined by facilities due to need for as needed Haldol. Initially in the ED on 4/5 patient became hypotensive, hypothermic with change in mentation therefore admitted to the hospital.  Infectious workup was unremarkable.  Due to his agitation, psychiatry team was consulted. On 4/27 during hospitalization he was diagnosed with parainfluenza infection which was treated conservatively.   Currently pending safe disposition.  Assessment & Plan:   Principal Problem:   Dementia with behavioral disturbance (HCC) Active Problems:   Mixed hyperlipidemia   History of TIA (transient ischemic attack)   Anxiety and depression   BPH (benign prostatic hyperplasia)   OAB (overactive bladder)   Altered mental status   Hypothermia   Restless leg   Vitamin B12 deficiency   Parainfluenza infection  Agitation, Dementia with behavioral disturbances: Currently on Depakote, Seroquel and Aricept.  He has Intermittent significant agitation.  Continue Prn Haldol and/or Zyprexa. He was evaluated by psychiatry , Continue bedtime trazodone 150 mg  5/26 again patient is having behavioral issues, psych was reengaged. S/p Ativan 2 mg po x one dose given Patient was reevaluated by psych, started  Remeron 15 mg nightly mood stabilizer, increased Seroquel 150 mg nightly 5/28 increased Depakote 750 mg p.o. twice daily, started Haldol 5 mg p.o. twice daily scheduled Avoid using Ativan as needed due to withdrawal effects   Guardianship Per TOC DSS is involved and pursuing guardianship, they are responsible for making medical decisions for the patient   Parainfluenza infection. Treated conservatively.  Diagnosed on 4/27.   Hypotension, hypothermia. Resolved, no obvious infection   Recurrent bilateral lower extremity edema. Lower extremity duplex were negative.  5/25 significant edema, right >left 5/25 Lasix 20 mg p.o. daily for 3 days Monitor renal functions and urine output Patient was advised to keep legs elevated, and use stockings if possible. 5/26 lower extremity edema improved, venous duplex repeated negative for DVT   Restless leg; Continue Requip at nighttime.   Anxiety and depression. Continue Depakote and Seroquel.   Vitamin B12 and D deficiency. On oral supplements.   BPH. Continuing Flomax.   Fecal incontinence secondary to constipation Continue Bowel regimen.     Body mass index is 24.74 kg/m.  Interventions:    DVT prophylaxis: Lovenox Code Status: Full code Family Communication: No family at bed side Disposition Plan:    Status is: Inpatient Remains inpatient appropriate because:   Pt is from  [place] nursing facility in dementia unit, still has behavioral issue, pending determination of guardian and placement, which precludes a safe discharge. Discharge to dementia unit, when bed will be available, awaiting for guardianship.  TOC is following for placement  Consultants:  Psychiatry  Procedures: None  Antimicrobials:  Anti-infectives (From admission, onward)    None      Subjective: Patient was seen and examined  at bedside.  He continues to remain sedated. On calling his name and shaking his shoulder, he opens his eyes and goes  back to sleep.  Objective: Vitals:   11/01/22 1552 11/01/22 2126 11/02/22 0800 11/02/22 1040  BP: (!) 133/90 114/78 116/62 106/77  Pulse: 90 87 77   Resp:  18 20 18   Temp:  99.4 F (37.4 C) 98.5 F (36.9 C) 98.8 F (37.1 C)  TempSrc:  Oral Oral Axillary  SpO2: 95% 98% 100% 97%  Weight:      Height:        Intake/Output Summary (Last 24 hours) at 11/02/2022 1152 Last data filed at 11/01/2022 2000 Gross per 24 hour  Intake --  Output 650 ml  Net -650 ml   Filed Weights   08/08/22 1205 08/14/22 0038 09/07/22 0057  Weight: 81 kg 78.2 kg 78.2 kg    Examination:  General exam: Appears calm and comfortable, not in any distress, sedated Respiratory system: Clear to auscultation. Respiratory effort normal. Cardiovascular system: S1 & S2 heard, regular rate and rhythm, no murmur. Gastrointestinal system: Abdomen is soft, non tender, non distended, bowel sounds present Central nervous system: Lethargic, sedated, not following commands Extremities: Symmetric 5 x 5 power. Skin: No rashes, lesions or ulcers Psychiatry: Not assessed.    Data Reviewed: I have personally reviewed following labs and imaging studies  CBC: Recent Labs  Lab 10/28/22 0857 10/30/22 0951 11/01/22 0959  WBC 4.4 6.1 8.4  HGB 11.9* 13.0 13.4  HCT 36.3* 39.2 40.0  MCV 94.8 92.2 93.0  PLT 203 218 218   Basic Metabolic Panel: Recent Labs  Lab 10/27/22 0841 10/28/22 0440 10/28/22 0857 10/29/22 0447 10/30/22 0419 10/30/22 0951 10/31/22 0410 11/01/22 0959  NA 137 138  --  137 138  --  138  --   K 4.2 3.6  --  4.4 4.1  --  4.3  --   CL 107 106  --  103 105  --  104  --   CO2 24 27  --  26 25  --  25  --   GLUCOSE 95 94  --  90 103*  --  96  --   BUN 26* 22  --  23 23  --  25*  --   CREATININE 1.17 1.10  --  1.13 1.09  --  1.06  --   CALCIUM 8.4* 8.4*  --  9.0 9.1  --  9.0  --   MG  --   --  2.2  --   --  2.1  --  2.2  PHOS  --   --  4.2  --   --  4.6  --  4.6   GFR: Estimated Creatinine  Clearance: 54.5 mL/min (by C-G formula based on SCr of 1.06 mg/dL). Liver Function Tests: No results for input(s): "AST", "ALT", "ALKPHOS", "BILITOT", "PROT", "ALBUMIN" in the last 168 hours. No results for input(s): "LIPASE", "AMYLASE" in the last 168 hours. No results for input(s): "AMMONIA" in the last 168 hours. Coagulation Profile: No results for input(s): "INR", "PROTIME" in the last 168 hours. Cardiac Enzymes: No results for input(s): "CKTOTAL", "CKMB", "CKMBINDEX", "TROPONINI" in the last 168 hours. BNP (last 3 results) No results for input(s): "PROBNP" in the last 8760 hours. HbA1C: No results for input(s): "HGBA1C" in the last 72 hours. CBG: No results for input(s): "GLUCAP" in the last 168 hours. Lipid Profile: No results for input(s): "CHOL", "HDL", "LDLCALC", "TRIG", "CHOLHDL", "LDLDIRECT" in the last  72 hours. Thyroid Function Tests: No results for input(s): "TSH", "T4TOTAL", "FREET4", "T3FREE", "THYROIDAB" in the last 72 hours. Anemia Panel: No results for input(s): "VITAMINB12", "FOLATE", "FERRITIN", "TIBC", "IRON", "RETICCTPCT" in the last 72 hours. Sepsis Labs: No results for input(s): "PROCALCITON", "LATICACIDVEN" in the last 168 hours.  No results found for this or any previous visit (from the past 240 hour(s)).   Radiology Studies: No results found.  Scheduled Meds:  vitamin B-12  1,000 mcg Oral Daily   divalproex  750 mg Oral Q12H   donepezil  5 mg Oral Daily   enoxaparin (LOVENOX) injection  40 mg Subcutaneous Q24H   feeding supplement  237 mL Oral BID BM   haloperidol  5 mg Oral BID   lidocaine  1 patch Transdermal Q24H   melatonin  5 mg Oral QHS   polyethylene glycol  17 g Oral Daily   pravastatin  40 mg Oral QHS   QUEtiapine  150 mg Oral QHS   QUEtiapine  50 mg Oral [id number]   rOPINIRole  0.5 mg Oral QHS   tamsulosin  0.4 mg Oral QPC supper   traZODone  150 mg Oral QHS   Vitamin D (Ergocalciferol)  50,000 Units Oral Q7 days   Continuous  Infusions:   LOS: 57 days    Time spent: 50 mins    Willeen Niece, MD [facility]   If 7PM-7AM, please contact night-coverage

## 2022-11-03 NOTE — Consult Note (Signed)
Client asleep this morning on assessment.  RN staff reported he awakens around lunch and has not had any behavior issues.  Reconsult psych as needed.  Nanine Means, PMHNP

## 2022-11-03 NOTE — Progress Notes (Addendum)
PROGRESS NOTE    Terry Sloan  ZOX:[id number] DOB: 1941-02-25 DOA: 08/08/2022 PCP: Pcp, No   Brief Narrative:  This 82 year old male with history of dementia, behavioral disturbance, hyperlipidemia, BPH, insomnia, depression, anxiety, who presents to emergency department from [facility] rehab dementia department on 08/08/2022 for chief concerns of aggression with aggressive outburst towards staff. Presented to [name] ED on 2/24 due to agitation.  Was in the ED until 3/1 as he required SNF placement.  Sent to [facility] and [name].  On 3/7 returned back to ER for confusion, combative with staff.  Was IVC in the ED. Behavioral health was consulted.  TOC was consulted for placement to SNF again. however, was declined by facilities due to need for as needed Haldol. Initially in the ED on 4/5 patient became hypotensive, hypothermic with change in mentation therefore admitted to the hospital.  Infectious workup was unremarkable.  Due to his agitation, psychiatry team was consulted. On 4/27 during hospitalization he was diagnosed with parainfluenza infection which was treated conservatively.   Currently pending safe disposition.  Assessment & Plan:   Principal Problem:   Dementia with behavioral disturbance (HCC) Active Problems:   Mixed hyperlipidemia   History of TIA (transient ischemic attack)   Anxiety and depression   BPH (benign prostatic hyperplasia)   OAB (overactive bladder)   Altered mental status   Hypothermia   Restless leg   Vitamin B12 deficiency   Parainfluenza infection  Agitation, Dementia with behavioral disturbances: Followed by psychiatry, currently on depakote and haldol and serquel standing, haldol/geodon prn Engaging with palliative today as this appears to be end-stage dementia, hospice appropriate. Had lengthy conversation with daughter today. She says patient has signed a dnr and affirms this wish, will make patient dnr. She is open to the idea of  hospice.   Dysphagia Nursing concern today, coughing with feeding - slp swallow eval  Parainfluenza infection. Treated conservatively.  Diagnosed on 4/27.   Hypotension, hypothermia. Resolved, no obvious infection   Recurrent bilateral lower extremity edema. Lower extremity duplex were negative.  5/25 significant edema, right >left 5/25 Lasix 20 mg p.o. daily for 3 days Monitor renal functions and urine output Patient was advised to keep legs elevated, and use stockings if possible. 5/26 lower extremity edema improved, venous duplex repeated negative for DVT   Restless leg; Continue Requip at nighttime.   Anxiety and depression. Continue Depakote and Seroquel.   Vitamin B12 and D deficiency. On oral supplements.   BPH. Continuing Flomax.   Fecal incontinence secondary to constipation Continue Bowel regimen.     Body mass index is 24.74 kg/m.  Interventions:    DVT prophylaxis: Lovenox Code Status: dnr confirmed with daughter Family Communication: No family at bed side Disposition Plan:    Status is: Inpatient Remains inpatient appropriate because:   Pt is from  [facility] nursing facility in dementia unit, pending placement made difficulty by behavioral issues though those are stable the past several days  Consultants:  Psychiatry  Procedures: None  Antimicrobials:  Anti-infectives (From admission, onward)    None      Subjective: Patient was seen and examined at bedside.  Sleeping.  Objective: Vitals:   11/01/22 1552 11/01/22 2126 11/02/22 0800 11/02/22 1040  BP: (!) 133/90 114/78 116/62 106/77  Pulse: 90 87 77   Resp:  18 20 18   Temp:  99.4 F (37.4 C) 98.5 F (36.9 C) 98.8 F (37.1 C)  TempSrc:  Oral Oral Axillary  SpO2: 95% 98%  100% 97%  Weight:      Height:        Intake/Output Summary (Last 24 hours) at 11/02/2022 1152 Last data filed at 11/01/2022 2000 Gross per 24 hour  Intake --  Output 650 ml  Net -650 ml   Filed Weights    08/08/22 1205 08/14/22 0038 09/07/22 0057  Weight: 81 kg 78.2 kg 78.2 kg    Examination:  General exam: Appears calm and comfortable, not in any distress, asleep Respiratory system: Clear to auscultation. Respiratory effort normal. Cardiovascular system: S1 & S2 heard, regular rate and rhythm, no murmur. Gastrointestinal system: Abdomen is soft, non tender, non distended, bowel sounds present Central nervous system: Lethargic, sedated, not following commands Extremities: Symmetric 5 x 5 power. Skin: No rashes, lesions or ulcers Psychiatry: Not assessed.    Data Reviewed: I have personally reviewed following labs and imaging studies  CBC: Recent Labs  Lab 10/28/22 0857 10/30/22 0951 11/01/22 0959  WBC 4.4 6.1 8.4  HGB 11.9* 13.0 13.4  HCT 36.3* 39.2 40.0  MCV 94.8 92.2 93.0  PLT 203 218 218   Basic Metabolic Panel: Recent Labs  Lab 10/27/22 0841 10/28/22 0440 10/28/22 0857 10/29/22 0447 10/30/22 0419 10/30/22 0951 10/31/22 0410 11/01/22 0959  NA 137 138  --  137 138  --  138  --   K 4.2 3.6  --  4.4 4.1  --  4.3  --   CL 107 106  --  103 105  --  104  --   CO2 24 27  --  26 25  --  25  --   GLUCOSE 95 94  --  90 103*  --  96  --   BUN 26* 22  --  23 23  --  25*  --   CREATININE 1.17 1.10  --  1.13 1.09  --  1.06  --   CALCIUM 8.4* 8.4*  --  9.0 9.1  --  9.0  --   MG  --   --  2.2  --   --  2.1  --  2.2  PHOS  --   --  4.2  --   --  4.6  --  4.6   GFR: Estimated Creatinine Clearance: 54.5 mL/min (by C-G formula based on SCr of 1.06 mg/dL). Liver Function Tests: No results for input(s): "AST", "ALT", "ALKPHOS", "BILITOT", "PROT", "ALBUMIN" in the last 168 hours. No results for input(s): "LIPASE", "AMYLASE" in the last 168 hours. No results for input(s): "AMMONIA" in the last 168 hours. Coagulation Profile: No results for input(s): "INR", "PROTIME" in the last 168 hours. Cardiac Enzymes: No results for input(s): "CKTOTAL", "CKMB", "CKMBINDEX", "TROPONINI"  in the last 168 hours. BNP (last 3 results) No results for input(s): "PROBNP" in the last 8760 hours. HbA1C: No results for input(s): "HGBA1C" in the last 72 hours. CBG: No results for input(s): "GLUCAP" in the last 168 hours. Lipid Profile: No results for input(s): "CHOL", "HDL", "LDLCALC", "TRIG", "CHOLHDL", "LDLDIRECT" in the last 72 hours. Thyroid Function Tests: No results for input(s): "TSH", "T4TOTAL", "FREET4", "T3FREE", "THYROIDAB" in the last 72 hours. Anemia Panel: No results for input(s): "VITAMINB12", "FOLATE", "FERRITIN", "TIBC", "IRON", "RETICCTPCT" in the last 72 hours. Sepsis Labs: No results for input(s): "PROCALCITON", "LATICACIDVEN" in the last 168 hours.  No results found for this or any previous visit (from the past 240 hour(s)).   Radiology Studies: No results found.  Scheduled Meds:  vitamin B-12  1,000 mcg Oral Daily  divalproex  750 mg Oral Q12H   donepezil  5 mg Oral Daily   enoxaparin (LOVENOX) injection  40 mg Subcutaneous Q24H   feeding supplement  237 mL Oral BID BM   haloperidol  5 mg Oral BID   lidocaine  1 patch Transdermal Q24H   melatonin  5 mg Oral QHS   polyethylene glycol  17 g Oral Daily   pravastatin  40 mg Oral QHS   QUEtiapine  150 mg Oral QHS   QUEtiapine  50 mg Oral BH-[id number]   rOPINIRole  0.5 mg Oral QHS   tamsulosin  0.4 mg Oral QPC supper   traZODone  150 mg Oral QHS   Vitamin D (Ergocalciferol)  50,000 Units Oral Q7 days   Continuous Infusions:   LOS: 57 days    Time spent: 30 mins    Shonna Chock, MD [facility]   If 7PM-7AM, please contact night-coverage

## 2022-11-04 LAB — BASIC METABOLIC PANEL
Anion gap: 9 (ref 5–15)
BUN: 59 mg/dL — ABNORMAL HIGH (ref 8–23)
CO2: 24 mmol/L (ref 22–32)
Calcium: 9.7 mg/dL (ref 8.9–10.3)
Chloride: 110 mmol/L (ref 98–111)
Creatinine, Ser: 1.27 mg/dL — ABNORMAL HIGH (ref 0.61–1.24)
GFR, Estimated: 56 mL/min — ABNORMAL LOW (ref >60)
Glucose, Bld: 133 mg/dL — ABNORMAL HIGH (ref 70–99)
Potassium: 4.3 mmol/L (ref 3.5–5.1)
Sodium: 143 mmol/L (ref 135–145)

## 2022-11-04 NOTE — Progress Notes (Signed)
SLP Cancellation Note  Patient Details Name: Terry Sloan MRN: [id number] DOB: Jun 23, 1940   Cancelled treatment:       Reason Eval/Treat Not Completed: Medical issues which prohibited therapy. Pt somnolent and not appropriate for PO intake at this time. Per chart review, pt receives multiple sedating medications due agitation in setting of end-stage dementia.  Clyde Canterbury, M.S., CCC-SLP Speech-Language Pathologist [facility] [phone] Arnette Felts)  Woodroe Chen 11/04/2022, 11:15 AM

## 2022-11-04 NOTE — Progress Notes (Addendum)
PROGRESS NOTE    Terry Sloan  EAV:[id number] DOB: [birth year] DOA: 08/08/2022 PCP: Pcp, No   Brief Narrative:  This 82 year old male with history of dementia, behavioral disturbance, hyperlipidemia, BPH, insomnia, depression, anxiety, who presents to emergency department from [facility] rehab dementia department on 08/08/2022 for chief concerns of aggression with aggressive outburst towards staff. Presented to [name] on 2/24 due to agitation.  Was in the ED until 3/1 as he required SNF placement.  Sent to [place] [facility] and [name].  On 3/7 returned back to ER for confusion, combative with staff.  Was IVC in the ED. Behavioral health was consulted.  TOC was consulted for placement to SNF again. however, was declined by facilities due to need for as needed Haldol. Initially in the ED on 4/5 patient became hypotensive, hypothermic with change in mentation therefore admitted to the hospital.  Infectious workup was unremarkable.  Due to his agitation, psychiatry team was consulted. On 4/27 during hospitalization he was diagnosed with parainfluenza infection which was treated conservatively.   Currently pending safe disposition.  Assessment & Plan:   Principal Problem:   Dementia with behavioral disturbance (HCC) Active Problems:   Mixed hyperlipidemia   History of TIA (transient ischemic attack)   Anxiety and depression   BPH (benign prostatic hyperplasia)   OAB (overactive bladder)   Altered mental status   Hypothermia   Restless leg   Vitamin B12 deficiency   Parainfluenza infection  Agitation, Dementia with behavioral disturbances: Followed by psychiatry, currently on depakote and haldol and serquel standing, haldol/geodon prn Engaging with palliative as this appears to be end-stage dementia, hospice appropriate. Had lengthy conversation with daughter 6/2. She says patient has signed a dnr and affirms this wish, will make patient dnr. She is open to the idea of hospice.  Patient is overly sedated, will discuss weaning meds with psychiatry - will hold seroquel and check am ammonia/valproic acid level   Dysphagia Nursing concern , coughing with feeding - slp swallow eval ordered  Parainfluenza infection. Treated conservatively.  Diagnosed on 4/27.   Hypotension, hypothermia. Resolved, no obvious infection   Recurrent bilateral lower extremity edema. Lower extremity duplex were negative.  5/25 significant edema, right >left 5/25 Lasix 20 mg p.o. daily for 3 days Monitor renal functions and urine output Patient was advised to keep legs elevated, and use stockings if possible. 5/26 lower extremity edema improved, venous duplex repeated negative for DVT   Restless leg; Continue Requip at nighttime.   Anxiety and depression. Continue Depakote and Seroquel.   Vitamin B12 and D deficiency. On oral supplements.   BPH. Continuing Flomax.   Fecal incontinence secondary to constipation Continue Bowel regimen.     Body mass index is 24.74 kg/m.  Interventions:    DVT prophylaxis: Lovenox Code Status: dnr confirmed with daughter Family Communication: No family at bed side Disposition Plan:    Status is: Inpatient Remains inpatient appropriate because:   Pt is from  [place] nursing facility in dementia unit, pending placement made difficulty by behavioral issues though those are stable the past several days  Consultants:  Psychiatry  Procedures: None  Antimicrobials:  Anti-infectives (From admission, onward)    None      Subjective: Patient was seen and examined at bedside.  Sleeping.  Objective: Vitals:   11/02/22 2129 11/03/22 0751 11/03/22 1702 11/04/22 0742  BP: 131/72 118/69 (!) 145/71 110/71  Pulse: 88 80 93 74  Resp: 16 18 16 16   Temp: 98.3 F (36.8  C) 98.5 F (36.9 C) 98.1 F (36.7 C) 97.9 F (36.6 C)  TempSrc: Oral     SpO2: 100% 99% 98% 98%  Weight:      Height:        Intake/Output Summary (Last 24  hours) at 11/04/2022 1137 Last data filed at 11/03/2022 1800 Gross per 24 hour  Intake --  Output 600 ml  Net -600 ml   Filed Weights   08/08/22 1205 08/14/22 0038 09/07/22 0057  Weight: 81 kg 78.2 kg 78.2 kg    Examination:  General exam: Appears calm and comfortable, not in any distress, asleep Respiratory system: Clear to auscultation. Respiratory effort normal. Cardiovascular system: S1 & S2 heard, regular rate and rhythm, no murmur. Gastrointestinal system: Abdomen is soft, non tender, non distended, bowel sounds present Central nervous system: Lethargic, sedated, not following commands Extremities: Symmetric 5 x 5 power. Skin: No rashes, lesions or ulcers Psychiatry: Not assessed.    Data Reviewed: I have personally reviewed following labs and imaging studies  CBC: Recent Labs  Lab 10/30/22 0951 11/01/22 0959  WBC 6.1 8.4  HGB 13.0 13.4  HCT 39.2 40.0  MCV 92.2 93.0  PLT 218 218   Basic Metabolic Panel: Recent Labs  Lab 10/29/22 0447 10/30/22 0419 10/30/22 0951 10/31/22 0410 11/01/22 0959 11/04/22 0628  NA 137 138  --  138  --  143  K 4.4 4.1  --  4.3  --  4.3  CL 103 105  --  104  --  110  CO2 26 25  --  25  --  24  GLUCOSE 90 103*  --  96  --  133*  BUN 23 23  --  25*  --  59*  CREATININE 1.13 1.09  --  1.06  --  1.27*  CALCIUM 9.0 9.1  --  9.0  --  9.7  MG  --   --  2.1  --  2.2  --   PHOS  --   --  4.6  --  4.6  --    GFR: Estimated Creatinine Clearance: 45.5 mL/min (A) (by C-G formula based on SCr of 1.27 mg/dL (H)). Liver Function Tests: No results for input(s): "AST", "ALT", "ALKPHOS", "BILITOT", "PROT", "ALBUMIN" in the last 168 hours. No results for input(s): "LIPASE", "AMYLASE" in the last 168 hours. No results for input(s): "AMMONIA" in the last 168 hours. Coagulation Profile: No results for input(s): "INR", "PROTIME" in the last 168 hours. Cardiac Enzymes: No results for input(s): "CKTOTAL", "CKMB", "CKMBINDEX", "TROPONINI" in the last  168 hours. BNP (last 3 results) No results for input(s): "PROBNP" in the last 8760 hours. HbA1C: No results for input(s): "HGBA1C" in the last 72 hours. CBG: No results for input(s): "GLUCAP" in the last 168 hours. Lipid Profile: No results for input(s): "CHOL", "HDL", "LDLCALC", "TRIG", "CHOLHDL", "LDLDIRECT" in the last 72 hours. Thyroid Function Tests: No results for input(s): "TSH", "T4TOTAL", "FREET4", "T3FREE", "THYROIDAB" in the last 72 hours. Anemia Panel: No results for input(s): "VITAMINB12", "FOLATE", "FERRITIN", "TIBC", "IRON", "RETICCTPCT" in the last 72 hours. Sepsis Labs: No results for input(s): "PROCALCITON", "LATICACIDVEN" in the last 168 hours.  No results found for this or any previous visit (from the past 240 hour(s)).   Radiology Studies: No results found.  Scheduled Meds:  vitamin B-12  1,000 mcg Oral Daily   divalproex  750 mg Oral Q12H   donepezil  5 mg Oral Daily   enoxaparin (LOVENOX) injection  40 mg Subcutaneous Q24H  feeding supplement  237 mL Oral BID BM   haloperidol  5 mg Oral BID   lidocaine  1 patch Transdermal Q24H   melatonin  5 mg Oral QHS   polyethylene glycol  17 g Oral Daily   pravastatin  40 mg Oral QHS   QUEtiapine  150 mg Oral QHS   QUEtiapine  50 mg Oral BH-[id number]   rOPINIRole  0.5 mg Oral QHS   tamsulosin  0.4 mg Oral QPC supper   traZODone  150 mg Oral QHS   Vitamin D (Ergocalciferol)  50,000 Units Oral Q7 days   Continuous Infusions:   LOS: 59 days    Time spent: 25 mins    Shonna Chock, MD [facility]   If 7PM-7AM, please contact night-coverage

## 2022-11-04 NOTE — Consult Note (Signed)
  Treatment team reports that the patient is now thought to be over sedated on his current range of medications.  Reviewed chart.  It appears that yesterday he was too sedated to consume oral medications however today medicines were administered at 10:00.  I recommend checking a Depakote and ammonia level tomorrow morning also will discontinue the current daytime Seroquel doses and we can see if that creates a better picture around the clock.  He still has plenty of as needed medicine.

## 2022-11-05 DIAGNOSIS — Z7189 Other specified counseling: Secondary | ICD-10-CM

## 2022-11-05 LAB — VALPROIC ACID LEVEL: Valproic Acid Lvl: 61 ug/mL (ref 50.0–100.0)

## 2022-11-05 LAB — AMMONIA: Ammonia: <10 umol/L (ref 9–35)

## 2022-11-05 MED ORDER — HALOPERIDOL 2 MG PO TABS
4.0000 mg | ORAL_TABLET | Freq: Two times a day (BID) | ORAL | Status: DC
  Administered 2022-11-06 – 2022-11-08 (×4): 4 mg via ORAL
  Filled 2022-11-05 (×8): qty 2
  Filled 2022-11-05: qty 8
  Filled 2022-11-05 (×2): qty 2

## 2022-11-05 NOTE — Plan of Care (Signed)
  Problem: Fluid Volume: Goal: Hemodynamic stability will improve Outcome: Progressing   Problem: Clinical Measurements: Goal: Diagnostic test results will improve Outcome: Progressing Goal: Signs and symptoms of infection will decrease Outcome: Progressing   Problem: Respiratory: Goal: Ability to maintain adequate ventilation will improve Outcome: Progressing   Problem: Education: Goal: Knowledge of General Education information will improve Description: Including pain rating scale, medication(s)/side effects and non-pharmacologic comfort measures Outcome: Progressing   Problem: Health Behavior/Discharge Planning: Goal: Ability to manage health-related needs will improve Outcome: Progressing   Problem: Clinical Measurements: Goal: Ability to maintain clinical measurements within normal limits will improve Outcome: Progressing Goal: Will remain free from infection Outcome: Progressing Goal: Diagnostic test results will improve Outcome: Progressing Goal: Respiratory complications will improve Outcome: Progressing Goal: Cardiovascular complication will be avoided Outcome: Progressing   Problem: Activity: Goal: Risk for activity intolerance will decrease Outcome: Progressing   Problem: Activity: Goal: Risk for activity intolerance will decrease Outcome: Progressing

## 2022-11-05 NOTE — Progress Notes (Signed)
PROGRESS NOTE    Terry Sloan  ZOX:[id number] DOB: [birth year] DOA: 08/08/2022 PCP: Pcp, No   Brief Narrative:  This 82 year old male with history of dementia, behavioral disturbance, hyperlipidemia, BPH, insomnia, depression, anxiety, who presents to emergency department from [facility] rehab dementia department on 08/08/2022 for chief concerns of aggression with aggressive outburst towards staff. Presented to [name] ED on 2/24 due to agitation.  Was in the ED until 3/1 as he required SNF placement.  Sent to [facility] and [name].  On 3/7 returned back to ER for confusion, combative with staff.  Was IVC in the ED. Behavioral health was consulted.  TOC was consulted for placement to SNF again. however, was declined by facilities due to need for as needed Haldol. Initially in the ED on 4/5 patient became hypotensive, hypothermic with change in mentation therefore admitted to the hospital.  Infectious workup was unremarkable.  Due to his agitation, psychiatry team was consulted. On 4/27 during hospitalization he was diagnosed with parainfluenza infection which was treated conservatively.   Currently pending safe disposition.  Assessment & Plan:   Principal Problem:   Dementia with behavioral disturbance (HCC) Active Problems:   Mixed hyperlipidemia   History of TIA (transient ischemic attack)   Anxiety and depression   BPH (benign prostatic hyperplasia)   OAB (overactive bladder)   Altered mental status   Hypothermia   Restless leg   Vitamin B12 deficiency   Parainfluenza infection  Agitation, Dementia with behavioral disturbances: Followed by psychiatry, currently on depakote and haldol and serquel standing, haldol/geodon prn Engaging with palliative as this appears to be end-stage dementia, hospice appropriate. Had lengthy conversation with daughter 6/2. She says patient has signed a dnr and affirms this wish, will make patient dnr. She is open to the idea of hospice.  Patient appears overly seated. Psychiatry re-involved. Seroquel discontinued. Valproic acid level therapeutic today, ammonia not elevated. Have decreased standing haldol dose. Palliative to meet w/ family today   Dysphagia Nursing concern , coughing with feeding - slp swallow eval ordered  Parainfluenza infection. Treated conservatively.  Diagnosed on 4/27.   Hypotension, hypothermia. Resolved, no obvious infection   Recurrent bilateral lower extremity edema. Lower extremity duplex were negative.  5/25 significant edema, right >left 5/25 Lasix 20 mg p.o. daily for 3 days Monitor renal functions and urine output Patient was advised to keep legs elevated, and use stockings if possible. 5/26 lower extremity edema improved, venous duplex repeated negative for DVT   Restless leg; Continue Requip at nighttime.   Anxiety and depression. Continue Depakote and Seroquel.   Vitamin B12 and D deficiency. On oral supplements.   BPH. Continuing Flomax.   Fecal incontinence secondary to constipation Continue Bowel regimen.     Body mass index is 24.74 kg/m.  Interventions:    DVT prophylaxis: Lovenox Code Status: dnr confirmed with daughter Family Communication: son and daughter updated telephonically 6/3 Disposition Plan:    Status is: Inpatient Remains inpatient appropriate because:   Pt is from  [facility] in dementia unit, pending placement made difficulty by behavioral issues though those are stable the past several days  Consultants:  Psychiatry  Procedures: None  Antimicrobials:  Anti-infectives (From admission, onward)    None      Subjective: Patient was seen and examined at bedside.  Sleeping.  Objective: Vitals:   11/04/22 2111 11/05/22 0105 11/05/22 0405 11/05/22 0921  BP: 99/66 123/83 136/83 109/63  Pulse: 84 80 78 71  Resp: 18 18  17 14  Temp: 98.1 F (36.7 C) 98.4 F (36.9 C) 97.8 F (36.6 C) 98 F (36.7 C)  TempSrc:  Axillary Oral Oral Oral  SpO2: 99% 100%  98%  Weight:      Height:        Intake/Output Summary (Last 24 hours) at 11/05/2022 1540 Last data filed at 11/05/2022 1235 Gross per 24 hour  Intake 250 ml  Output 150 ml  Net 100 ml   Filed Weights   08/08/22 1205 08/14/22 0038 09/07/22 0057  Weight: 81 kg 78.2 kg 78.2 kg    Examination:  General exam: Appears calm and comfortable, not in any distress, asleep Respiratory system: Clear to auscultation. Respiratory effort normal. Cardiovascular system: S1 & S2 heard, regular rate and rhythm, no murmur. Gastrointestinal system: Abdomen is soft, non tender, non distended, bowel sounds present Central nervous system: Lethargic, sedated, not following commands Extremities: Symmetric 5 x 5 power. Skin: No rashes, lesions or ulcers Psychiatry: Not assessed.    Data Reviewed: I have personally reviewed following labs and imaging studies  CBC: Recent Labs  Lab 10/30/22 0951 11/01/22 0959  WBC 6.1 8.4  HGB 13.0 13.4  HCT 39.2 40.0  MCV 92.2 93.0  PLT 218 218   Basic Metabolic Panel: Recent Labs  Lab 10/30/22 0419 10/30/22 0951 10/31/22 0410 11/01/22 0959 11/04/22 0628  NA 138  --  138  --  143  K 4.1  --  4.3  --  4.3  CL 105  --  104  --  110  CO2 25  --  25  --  24  GLUCOSE 103*  --  96  --  133*  BUN 23  --  25*  --  59*  CREATININE 1.09  --  1.06  --  1.27*  CALCIUM 9.1  --  9.0  --  9.7  MG  --  2.1  --  2.2  --   PHOS  --  4.6  --  4.6  --    GFR: Estimated Creatinine Clearance: 45.5 mL/min (A) (by C-G formula based on SCr of 1.27 mg/dL (H)). Liver Function Tests: No results for input(s): "AST", "ALT", "ALKPHOS", "BILITOT", "PROT", "ALBUMIN" in the last 168 hours. No results for input(s): "LIPASE", "AMYLASE" in the last 168 hours. Recent Labs  Lab 11/05/22 0620  AMMONIA <10   Coagulation Profile: No results for input(s): "INR", "PROTIME" in the last 168 hours. Cardiac Enzymes: No results for input(s):  "CKTOTAL", "CKMB", "CKMBINDEX", "TROPONINI" in the last 168 hours. BNP (last 3 results) No results for input(s): "PROBNP" in the last 8760 hours. HbA1C: No results for input(s): "HGBA1C" in the last 72 hours. CBG: No results for input(s): "GLUCAP" in the last 168 hours. Lipid Profile: No results for input(s): "CHOL", "HDL", "LDLCALC", "TRIG", "CHOLHDL", "LDLDIRECT" in the last 72 hours. Thyroid Function Tests: No results for input(s): "TSH", "T4TOTAL", "FREET4", "T3FREE", "THYROIDAB" in the last 72 hours. Anemia Panel: No results for input(s): "VITAMINB12", "FOLATE", "FERRITIN", "TIBC", "IRON", "RETICCTPCT" in the last 72 hours. Sepsis Labs: No results for input(s): "PROCALCITON", "LATICACIDVEN" in the last 168 hours.  No results found for this or any previous visit (from the past 240 hour(s)).   Radiology Studies: No results found.  Scheduled Meds:  vitamin B-12  1,000 mcg Oral Daily   divalproex  750 mg Oral Q12H   donepezil  5 mg Oral Daily   enoxaparin (LOVENOX) injection  40 mg Subcutaneous Q24H   feeding supplement  237 mL  Oral BID BM   haloperidol  4 mg Oral BID   lidocaine  1 patch Transdermal Q24H   melatonin  5 mg Oral QHS   polyethylene glycol  17 g Oral Daily   pravastatin  40 mg Oral QHS   rOPINIRole  0.5 mg Oral QHS   tamsulosin  0.4 mg Oral QPC supper   traZODone  150 mg Oral QHS   Vitamin D (Ergocalciferol)  50,000 Units Oral Q7 days   Continuous Infusions:   LOS: 60 days    Time spent: 25 mins    Shonna Chock, MD [facility]   If 7PM-7AM, please contact night-coverage

## 2022-11-05 NOTE — Progress Notes (Signed)
Patient ID: Terry Sloan, male   DOB: 10-Aug-1940, 82 y.o.   MRN: [id number] Brief note for follow-up.  Received communication from the treatment team that they are discussing options for further management.  Family still has expressed concerns about over sedation.  It looks like the Seroquel has been completely discontinued.  Depakote level came back 61 which is in the low therapeutic range not elevated, and the ammonia level was negligible so that medicine seems to be safe from the standpoint of sedation.  Once again reviewed with the treatment team the concerns that in the past the patient was either violent or sedated without much in between.  We can try cutting the Haldol dose down to 4 mg from 5 mg and see if that makes a bit of a difference.  There are still many as needed medicines available if needed.
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
## 2022-11-06 NOTE — Progress Notes (Signed)
Daily Progress Note   Patient Name: Terry Sloan       Date: 11/06/2022 DOB: [birth year]  Age: 82 y.o. MRN#: [id number] Attending Physician: [name] Shutters, MD Primary Care Physician: Pcp, No Admit Date: 08/08/2022  Reason for Consultation/Follow-up: Establishing goals of care  Subjective: Notes reviewed. In to see patient. He is currently resting in bed, no family at bedside. He is awake and tries to speak, but I am unable to understand what he is saying. Spoke with staff. They discuss his admission up to this point. They discuss that his PO intake has not been enough to sustain him today. Will reassess tomorrow and speak with daughter.     Length of Stay: 29  Current Medications: Scheduled Meds:   vitamin B-12  1,000 mcg Oral Daily   divalproex  750 mg Oral Q12H   donepezil  5 mg Oral Daily   enoxaparin (LOVENOX) injection  40 mg Subcutaneous Q24H   feeding supplement  237 mL Oral BID BM   haloperidol  4 mg Oral BID   lidocaine  1 patch Transdermal Q24H   melatonin  5 mg Oral QHS   polyethylene glycol  17 g Oral Daily   pravastatin  40 mg Oral QHS   rOPINIRole  0.5 mg Oral QHS   tamsulosin  0.4 mg Oral QPC supper   traZODone  150 mg Oral QHS   Vitamin D (Ergocalciferol)  50,000 Units Oral Q7 days    Continuous Infusions:   PRN Meds: acetaminophen, bisacodyl, diphenhydrAMINE, guaiFENesin-dextromethorphan, haloperidol **OR** haloperidol lactate, hydrALAZINE, ipratropium-albuterol, LORazepam, metoprolol tartrate, ziprasidone  Physical Exam Pulmonary:     Effort: Pulmonary effort is normal.  Neurological:     Mental Status: He is alert.             Vital Signs: BP 119/70 (BP Location: Left Arm)   Pulse 78   Temp 98.3 F (36.8 C) (Oral)   Resp 16   Ht 5\' 10"  (1.778 m)    Wt 78.2 kg   SpO2 97%   BMI 24.74 kg/m  SpO2: SpO2: 97 % O2 Device: O2 Device: Room Air O2 Flow Rate: O2 Flow Rate (L/min): 2 L/min  Intake/output summary:  Intake/Output Summary (Last 24 hours) at 11/06/2022 1546 Last data filed at 11/06/2022 0530 Gross per 24 hour  Intake --  Output 1201 ml  Net -1201 ml   LBM: Last BM Date : 11/03/22 Baseline Weight: Weight: 81 kg Most recent weight: Weight: 78.2 kg     Patient Active Problem List   Diagnosis Date Noted   Parainfluenza infection 09/29/2022   Vitamin B12 deficiency 09/12/2022   Altered mental status 09/06/2022   Hypothermia 09/06/2022   Restless leg 09/06/2022   Dementia with behavioral disturbance (HCC) 07/27/2022   OAB (overactive bladder) 01/25/2019   BPH (benign prostatic hyperplasia) 07/27/2018   History of total left knee replacement 04/17/2017   History of TIA (transient ischemic attack) 10/16/2016   Aphasia 10/16/2016   Anxiety and depression 10/16/2016   PVD (peripheral vascular disease) (HCC) 09/23/2016   Mixed hyperlipidemia 09/23/2016   Weight loss, unintentional 09/23/2016    Palliative Care Assessment & Plan    Recommendations/Plan: PMT will follow  Code Status:    Code Status Orders  (From admission, onward)           Start     Ordered   11/03/22 1535  Do not attempt resuscitation (DNR)  Continuous       Question Answer Comment  If patient has no pulse and is not breathing Do Not Attempt Resuscitation   If patient has a pulse and/or is breathing: Medical Treatment Goals LIMITED ADDITIONAL INTERVENTIONS: Use medication/IV fluids and cardiac monitoring as indicated; Do not use intubation or mechanical ventilation (DNI), also provide comfort medications.  Transfer to Progressive/Stepdown as indicated, avoid Intensive Care.   Consent: Discussion documented in EHR or advanced directives reviewed      11/03/22 1535           Code Status History     Date Active Date Inactive Code Status  Order ID Comments User Context   09/06/2022 1610 11/03/2022 1535 Full Code [id number]  Lovenia Kim, DO ED   08/08/2022 1252 09/06/2022 1610 Full Code [id number]  Pilar Jarvis, MD ED   07/27/2022 1256 08/02/2022 1825 Full Code [id number]  Bethann Berkshire, MD ED   02/14/2017 1652 02/17/2017 1523 Full Code [id number]  Naida Sleight, PA-C Inpatient      Advance Directive Documentation    Flowsheet Row Most Recent Value  Type of Advance Directive Healthcare Power of Attorney  Pre-existing out of facility DNR order (yellow form or pink MOST form) --  "MOST" Form in Place? --       Prognosis: poor   Care plan was discussed with nurse  Thank you for allowing the Palliative Medicine Team to assist in the care of this patient.     Morton Stall, NP  Please contact Palliative Medicine Team phone at [phone] for questions and concerns.

## 2022-11-06 NOTE — Progress Notes (Addendum)
SLP Cancellation Note  Patient Details Name: Terry Sloan MRN: [id number] DOB: 04-Aug-1940   Cancelled treatment:       Reason Eval/Treat Not Completed: Patient's level of consciousness;Patient not medically ready (chart reviewed; attempted care w/ pt at bedside. NSG/MD/Palliative Care consulted re: pt's status and GOC.)  Pt has been admitted to [facility] for 89 days. He has a Baseline of Dementia w/ Behavioral Disturbance and agitation/aggression during this admit, per chart notes. He also has been receiving Sedating medications d/t the behavioral issues/agitation. Pt has been declined by Facilities d/t need for Haldol, per chart notes. Psychiatry has been consult and is now following for Medication management.  He was previously on a mech soft/regular diet w/ thin liquids w/ no issues noted initially at admit. Since receiving more sedating medications, he has not bee alert enough for oral intake.  Upon attempting to work w/ pt today, to awaken him for his breakfast meal delivered/BSE, he was completely lethargic/sedated and made NO response to MOD++ verbal/tactile stim given -- continued sternal rubbing and calling his name repeatedly. He has only opened his eyes briefly at other times of other days per others/chart notes.  W/ his Dementia and severely fluctuating presentation (esp. Being SEDATED) at any given time of the day, recommend to MD/Team a most conservative approach of NPO status and/or Dysphagia diet if concerns for aspiration/pulmonary decline are present. This was relayed to MD/Team -- strongly recommended holding all po's until pt can remain fully alert/calm for consistent periods of time and NOT needing sedating medications daily. At that time, ST services could be re-consulted to assess upgrade of diet if needed then. Noted Palliative Care's note indicating impact of sedation also on pt's presentation/ability to take po's and that things should improve to his Baseline when he is not sedated.  Palliative Care following for GOC. Any pt would be at increased risk for aspiration attempting to take po's when either agitated or sedated, so risk for aspiration/aspiration pneumonia is present for pt.  ST services will sign off at this time. Recommend frequent oral care, aspiration precautions. MD updated and agreed. NSG updated.      Jerilynn Som, MS, CCC-SLP Speech Language Pathologist Rehab Services; [facility] [phone] (ascom) Terry Sloan 11/06/2022, 1:44 PM

## 2022-11-06 NOTE — TOC Progression Note (Addendum)
Transition of Care [facility]) - Progression Note    Patient Details  Name: Terry Sloan MRN: [id number] Date of Birth: 1941/05/24  Transition of Care [facility]) CM/SW Contact  Darleene Cleaver, [place] Phone Number: 11/06/2022, 2:02 PM  Clinical Narrative:     Still looking for memory care facilities.   Expected Discharge Plan:  (TBD) Barriers to Discharge: Continued Medical Work up  Expected Discharge Plan and Services                                               Social Determinants of Health (SDOH) Interventions SDOH Screenings   Food Insecurity: Patient Unable To Answer (09/07/2022)  Housing: Low Risk  (09/07/2022)  Transportation Needs: Patient Unable To Answer (09/07/2022)  Utilities: Not At Risk (07/23/2022)  Alcohol Screen: Low Risk  (07/23/2022)  Depression (PHQ2-9): Low Risk  (07/23/2022)  Recent Concern: Depression (PHQ2-9) - Medium Risk (06/17/2022)  Financial Resource Strain: Low Risk  (07/23/2022)  Physical Activity: Insufficiently Active (07/23/2022)  Social Connections: Socially Isolated (07/23/2022)  Stress: No Stress Concern Present (07/23/2022)  Tobacco Use: Medium Risk (09/11/2022)    Readmission Risk Interventions     No data to display

## 2022-11-06 NOTE — Progress Notes (Signed)
Progress Note   Patient: Terry Sloan ZOX:[id number] DOB: Jun 12, 1940 DOA: 08/08/2022     61 DOS: the patient was seen and examined on 11/06/2022   Brief hospital course: [name] is 82 year old male is a 82 year old male with history of dementia, behavioral disturbance, hyperlipidemia, BPH, insomnia, depression, anxiety, who presents to emergency department from [facility] rehab dementia department on 08/08/2022 for chief concerns of aggression with aggressive outburst towards staff. [facility]ed to [name] ED on 2/24 due to agitation.  Was in the ED until 3/1 as he required SNF placement.  Sent to [name] and [name].  On 3/7 returned back to ER for confusion, combative with staff.  Was IVC in the ED.Behavioral health was consulted.  TOC was consulted for placement to SNF again. however, was declined by facilities due to need for as needed Haldol.  On 4/5 EDP requesting admission for hypotension, hypothermia and change in mentation.  Symptoms resolved by 4/6. Workup was unremarkable for any significant infectious etiology. Patient continues to be seen by psychiatry to help manage his agitation. During the hospitalization had parainfluenza viral infection on 4/27 treated conservatively. As of 5/2 has not required any as needed Haldol dose. Behavior appears to be well-controlled.  Not agitated.  Not combative.  06/05 : Seen during rounds and noted to be sedated.  Arouses to loud verbal stimuli but unable to stay awake long enough for any evaluation.  Assessment and Plan:  Principal Problem:   Dementia with behavioral disturbance (HCC) Active Problems:   Mixed hyperlipidemia   History of TIA (transient ischemic attack)   Anxiety and depression   BPH (benign prostatic hyperplasia)   OAB (overactive bladder)   Altered mental status   Hypothermia   Restless leg   Vitamin B12 deficiency   Parainfluenza infection   Agitation, Dementia with behavioral  disturbances: Followed by psychiatry, currently on depakote, haldol and trazodone , haldol/Ativan prn Engaging with palliative as this appears to be end-stage dementia, hospice appropriate.   Dr. Ashok Pall had lengthy conversation with daughter 6/2. She says patient has signed a dnr and affirms this wish, will make patient dnr. She is open to the idea of hospice.  Patient remains overly seated. Psychiatry re-involved. Seroquel discontinued. Valproic acid level therapeutic today, ammonia not elevated. Have decreased scheduled haldol dose with holding parameters.  Palliative to meet w/ family today    Dysphagia Nursing concern , coughing with feeding -Appreciate speech therapy input " with his Dementia and severely fluctuating presentation (ESP. SEDATED) at any given time of the day, I would recommend a most conservative approach and diet if concerns for aspiration/pulmonary decline.  I would recommend a Dysphagia level 1(puree d/t Edentulous status) and NECTAR liquids for his diet. IF/WHEN he can become fully alert/calm for consistent periods of time and NOT need sedating medications daily, then ST services could be re-consulted to assess upgrade of diet.  any pt would be at increased risk for aspiration attempting to take po's when either agitated or sedated, so risk for aspiration/aspiration pneumonia is present for him".  Will keep patient n.p.o. for now until more awake and less agitated      Parainfluenza infection. Treated conservatively.  Diagnosed on 4/27.   Hypotension, hypothermia. Resolved, no obvious infection   Recurrent bilateral lower extremity edema. Lower extremity duplex were negative.  5/25 significant edema, right >left 5/25 Lasix 20 mg p.o. daily for 3 days Monitor renal functions and urine output Patient was advised to keep legs  elevated, and use stockings if possible. 5/26 lower extremity edema improved, venous duplex repeated negative for DVT   Restless leg; Continue  Requip at nighttime.   Anxiety and depression. Continue Depakote and Seroquel.   Vitamin B12 and D deficiency. On oral supplements.   BPH. Continuing Flomax.   Fecal incontinence secondary to constipation Continue Bowel regimen.           Subjective: Patient is seen and examined at bedside.  He is sedated  Physical Exam: Vitals:   11/05/22 0405 11/05/22 0921 11/06/22 0529 11/06/22 0910  BP: 136/83 109/63 (!) 119/52 113/60  Pulse: 78 71 77 79  Resp: 17 14 18 16   Temp: 97.8 F (36.6 C) 98 F (36.7 C) 98.3 F (36.8 C) 98.5 F (36.9 C)  TempSrc: Oral Oral Oral   SpO2:  98% 97% 98%  Weight:      Height:       General exam: Appears calm and comfortable, not in any distress, lethargic and arouses to loud verbal stimuli Respiratory system: Clear to auscultation. Respiratory effort normal. Cardiovascular system: S1 & S2 heard, regular rate and rhythm, no murmur. Gastrointestinal system: Abdomen is soft, non tender, non distended, bowel sounds present Central nervous system: Lethargic, sedated, not following commands Extremities: Unable to assess Skin: No rashes, lesions or ulcers Psychiatry: Not assessed. Data Reviewed:  There are no new results to review at this time.  Family Communication: None  Disposition: Status is: Inpatient Remains inpatient appropriate because: Awaiting safe discharge plan  Planned Discharge Destination: Skilled nursing facility    Time spent: 35 minutes  Author: [name] Shutters, MD 11/06/2022 11:33 AM  For on call review [email or web address].

## 2022-11-07 LAB — BASIC METABOLIC PANEL
Anion gap: 10 (ref 5–15)
BUN: 55 mg/dL — ABNORMAL HIGH (ref 8–23)
CO2: 25 mmol/L (ref 22–32)
Calcium: 9.5 mg/dL (ref 8.9–10.3)
Chloride: 111 mmol/L (ref 98–111)
Creatinine, Ser: 1.37 mg/dL — ABNORMAL HIGH (ref 0.61–1.24)
GFR, Estimated: 51 mL/min — ABNORMAL LOW (ref >60)
Glucose, Bld: 104 mg/dL — ABNORMAL HIGH (ref 70–99)
Potassium: 4.4 mmol/L (ref 3.5–5.1)
Sodium: 146 mmol/L — ABNORMAL HIGH (ref 135–145)

## 2022-11-07 MED ORDER — DEXTROSE 5 % IV SOLN: INTRAVENOUS | Status: AC

## 2022-11-07 MED ORDER — LIDOCAINE 5 % EX PTCH: 1.0000 | MEDICATED_PATCH | Freq: Two times a day (BID) | CUTANEOUS | Status: DC | PRN

## 2022-11-07 NOTE — Progress Notes (Signed)
Daily Progress Note   Patient Name: Terry Sloan       Date: 11/07/2022 DOB: 1940-09-22  Age: 82 y.o. MRN#: [id number] Attending Physician: [name] Shutters, MD Primary Care Physician: Pcp, No Admit Date: 08/08/2022  Reason for Consultation/Follow-up: Establishing goals of care  Subjective: In to see patient. He is resting in bed.  He opens his eyes intermittently but does not speak to me.  Called to speak to patient's daughter.  She states they plan to come this evening after 5:00 to visit.  Discussed having a family meeting tomorrow.  With conversation they are in agreement for a conference phone call at 2 PM tomorrow.  Length of Stay: 70  Current Medications: Scheduled Meds:   vitamin B-12  1,000 mcg Oral Daily   divalproex  750 mg Oral Q12H   donepezil  5 mg Oral Daily   enoxaparin (LOVENOX) injection  40 mg Subcutaneous Q24H   feeding supplement  237 mL Oral BID BM   haloperidol  4 mg Oral BID   melatonin  5 mg Oral QHS   polyethylene glycol  17 g Oral Daily   pravastatin  40 mg Oral QHS   rOPINIRole  0.5 mg Oral QHS   tamsulosin  0.4 mg Oral QPC supper   traZODone  150 mg Oral QHS   Vitamin D (Ergocalciferol)  50,000 Units Oral Q7 days    Continuous Infusions:  dextrose 50 mL/hr at 11/07/22 1504    PRN Meds: acetaminophen, bisacodyl, diphenhydrAMINE, guaiFENesin-dextromethorphan, haloperidol **OR** haloperidol lactate, hydrALAZINE, ipratropium-albuterol, lidocaine, LORazepam, metoprolol tartrate, ziprasidone  Physical Exam Pulmonary:     Effort: Pulmonary effort is normal.  Neurological:     Mental Status: He is alert.             Vital Signs: BP 120/75   Pulse 75   Temp 98 F (36.7 C)   Resp 16   Ht 5\' 10"  (1.778 m)   Wt 78.2 kg   SpO2 95%   BMI 24.74 kg/m   SpO2: SpO2: 95 % O2 Device: O2 Device: Room Air O2 Flow Rate: O2 Flow Rate (L/min): 2 L/min  Intake/output summary:  Intake/Output Summary (Last 24 hours) at 11/07/2022 1518 Last data filed at 11/07/2022 1459 Gross per 24 hour  Intake 120 ml  Output 400 ml  Net -280  ml   LBM: Last BM Date : 11/03/22 Baseline Weight: Weight: 81 kg Most recent weight: Weight: 78.2 kg         Patient Active Problem List   Diagnosis Date Noted   Parainfluenza infection 09/29/2022   Vitamin B12 deficiency 09/12/2022   Altered mental status 09/06/2022   Hypothermia 09/06/2022   Restless leg 09/06/2022   Dementia with behavioral disturbance (HCC) 07/27/2022   OAB (overactive bladder) 01/25/2019   BPH (benign prostatic hyperplasia) 07/27/2018   History of total left knee replacement 04/17/2017   History of TIA (transient ischemic attack) 10/16/2016   Aphasia 10/16/2016   Anxiety and depression 10/16/2016   PVD (peripheral vascular disease) (HCC) 09/23/2016   Mixed hyperlipidemia 09/23/2016   Weight loss, unintentional 09/23/2016    Palliative Care Assessment & Plan    Recommendations/Plan: Conference call with family tomorrow at 2 PM  Code Status:    Code Status Orders  (From admission, onward)           Start     Ordered   11/03/22 1535  Do not attempt resuscitation (DNR)  Continuous       Question Answer Comment  If patient has no pulse and is not breathing Do Not Attempt Resuscitation   If patient has a pulse and/or is breathing: Medical Treatment Goals LIMITED ADDITIONAL INTERVENTIONS: Use medication/IV fluids and cardiac monitoring as indicated; Do not use intubation or mechanical ventilation (DNI), also provide comfort medications.  Transfer to Progressive/Stepdown as indicated, avoid Intensive Care.   Consent: Discussion documented in EHR or advanced directives reviewed      11/03/22 1535           Code Status History     Date Active Date Inactive Code Status Order  ID Comments User Context   09/06/2022 1610 11/03/2022 1535 Full Code [id number]  Lovenia Kim, DO ED   08/08/2022 1252 09/06/2022 1610 Full Code [id number]  Pilar Jarvis, MD ED   07/27/2022 1256 08/02/2022 1825 Full Code [id number]  Bethann Berkshire, MD ED   02/14/2017 1652 02/17/2017 1523 Full Code [id number]  Naida Sleight, PA-C Inpatient      Advance Directive Documentation    Flowsheet Row Most Recent Value  Type of Advance Directive Healthcare Power of Attorney  Pre-existing out of facility DNR order (yellow form or pink MOST form) --  "MOST" Form in Place? --       Prognosis: Poor   Thank you for allowing the Palliative Medicine Team to assist in the care of this patient.   Morton Stall, NP  Please contact Palliative Medicine Team phone at [phone] for questions and concerns.

## 2022-11-07 NOTE — [place] FL2 (Signed)
[place] MEDICAID FL2 LEVEL OF CARE FORM     IDENTIFICATION  Patient Name: Terry Sloan Birthdate: 02/25/1941 Sex: male Admission Date (Current Location): 08/08/2022  [facility] and [place] Number:  Engineer, civil (consulting) and Address:  [facility], [address] [place], [place] [address]      Provider Number: [id number]  Attending Physician Name and Address:  [name] Shutters, MD  Relative Name and Phone Number:  Annia Belt Daughter   [phone]  [name], [name]   [phone]    Current Level of Care: Hospital Recommended Level of Care: Memory Care, Skilled Nursing Facility Prior Approval Number:    Date Approved/Denied:   PASRR Number: [id number]  Discharge Plan: SNF (Memory Care)    Current Diagnoses: Patient Active Problem List   Diagnosis Date Noted   Parainfluenza infection 09/29/2022   Vitamin B12 deficiency 09/12/2022   Altered mental status 09/06/2022   Hypothermia 09/06/2022   Restless leg 09/06/2022   Dementia with behavioral disturbance (HCC) 07/27/2022   OAB (overactive bladder) 01/25/2019   BPH (benign prostatic hyperplasia) 07/27/2018   History of total left knee replacement 04/17/2017   History of TIA (transient ischemic attack) 10/16/2016   Aphasia 10/16/2016   Anxiety and depression 10/16/2016   PVD (peripheral vascular disease) (HCC) 09/23/2016   Mixed hyperlipidemia 09/23/2016   Weight loss, unintentional 09/23/2016    Orientation RESPIRATION BLADDER Height & Weight     Self  Normal Incontinent Weight: 172 lb 6.4 oz (78.2 kg) Height:  5\' 10"  (177.8 cm)  BEHAVIORAL SYMPTOMS/MOOD NEUROLOGICAL BOWEL NUTRITION STATUS      Incontinent Diet  AMBULATORY STATUS COMMUNICATION OF NEEDS Skin   Limited Assist Verbally Normal                       Personal Care Assistance Level of Assistance  Bathing, Feeding, Dressing Bathing Assistance: Limited assistance Feeding assistance: Limited assistance Dressing  Assistance: Limited assistance     Functional Limitations Info  Sight, Hearing, Speech Sight Info: Adequate Hearing Info: Adequate Speech Info: Adequate    SPECIAL CARE FACTORS FREQUENCY  PT (By licensed PT), OT (By licensed OT)     PT Frequency: Minimum 5x a week OT Frequency: Minimum 5x a week            Contractures Contractures Info: Not present    Additional Factors Info  Code Status, Allergies, Psychotropic Code Status Info: Full Code Allergies Info: Celebrex (Celecoxib) Psychotropic Info: divalproex (DEPAKOTE SPRINKLE) capsule 750 mg, haloperidol (HALDOL) tablet 4 mg,         Current Medications (11/07/2022):  This is the current hospital active medication list Current Facility-Administered Medications  Medication Dose Route Frequency Provider Last Rate Last Admin   acetaminophen (TYLENOL) tablet 650 mg  650 mg Oral Q6H PRN Bennett, Christal H, NP   650 mg at 10/29/22 2023   bisacodyl (DULCOLAX) suppository 10 mg  10 mg Rectal Daily PRN Gillis Santa, MD       cyanocobalamin (VITAMIN B12) tablet 1,000 mcg  1,000 mcg Oral Daily Lurene Shadow, MD   1,000 mcg at 11/06/22 1107   diphenhydrAMINE (BENADRYL) capsule 25 mg  25 mg Oral Q6H PRN Gillis Santa, MD   25 mg at 10/30/22 0149   divalproex (DEPAKOTE SPRINKLE) capsule 750 mg  750 mg Oral Q12H Clapacs, John T, MD   750 mg at 11/06/22 2234   donepezil (ARICEPT) tablet 5 mg  5 mg Oral Daily Cox, Amy N, DO   5  mg at 11/06/22 1107   enoxaparin (LOVENOX) injection 40 mg  40 mg Subcutaneous Q24H Cox, Amy N, DO   40 mg at 11/06/22 2246   feeding supplement (ENSURE ENLIVE / ENSURE PLUS) liquid 237 mL  237 mL Oral BID BM Cox, Amy N, DO   237 mL at 11/06/22 1430   guaiFENesin-dextromethorphan (ROBITUSSIN DM) 100-10 MG/5ML syrup 5 mL  5 mL Oral Q6H PRN Lurene Shadow, MD   5 mL at 10/02/22 0021   haloperidol (HALDOL) tablet 4 mg  4 mg Oral BID Agbata, Tochukwu, MD   4 mg at 11/06/22 2242   haloperidol (HALDOL) tablet 5 mg  5 mg  Oral Q6H PRN Madueme, Elvira C, RPH   5 mg at 10/30/22 1956   Or   haloperidol lactate (HALDOL) injection 5 mg  5 mg Intramuscular Q6H PRN Madueme, Elvira C, RPH   5 mg at 10/27/22 1038   hydrALAZINE (APRESOLINE) injection 10 mg  10 mg Intravenous Q4H PRN Amin, Ankit Chirag, MD       ipratropium-albuterol (DUONEB) 0.5-2.5 (3) MG/3ML nebulizer solution 3 mL  3 mL Nebulization Q4H PRN Amin, Ankit Chirag, MD       lidocaine (LIDODERM) 5 % 1 patch  1 patch Transdermal Q12H PRN Agbata, Tochukwu, MD       LORazepam (ATIVAN) tablet 2 mg  2 mg Oral Q6H PRN Agbata, Tochukwu, MD       melatonin tablet 5 mg  5 mg Oral QHS Gillis Santa, MD   5 mg at 11/06/22 2246   metoprolol tartrate (LOPRESSOR) injection 5 mg  5 mg Intravenous Q4H PRN Amin, Ankit Chirag, MD       polyethylene glycol (MIRALAX / GLYCOLAX) packet 17 g  17 g Oral Daily Amin, Ankit Chirag, MD   17 g at 11/06/22 1107   pravastatin (PRAVACHOL) tablet 40 mg  40 mg Oral QHS Cox, Amy N, DO   40 mg at 11/06/22 2241   rOPINIRole (REQUIP) tablet 0.5 mg  0.5 mg Oral QHS Bennett, Christal H, NP   0.5 mg at 11/06/22 2240   tamsulosin (FLOMAX) capsule 0.4 mg  0.4 mg Oral QPC supper Cox, Amy N, DO   0.4 mg at 11/06/22 1825   traZODone (DESYREL) tablet 150 mg  150 mg Oral QHS Agbata, Tochukwu, MD   150 mg at 11/06/22 2246   Vitamin D (Ergocalciferol) (DRISDOL) 1.25 MG (50000 UNIT) capsule 50,000 Units  50,000 Units Oral Q7 days Gillis Santa, MD   50,000 Units at 10/21/22 0853   ziprasidone (GEODON) injection 10 mg  10 mg Intramuscular Once PRN Gillis Santa, MD         Discharge Medications: Please see discharge summary for a list of discharge medications.  Relevant Imaging Results:  Relevant Lab Results:   Additional Information SSN [id number]  Darleene Cleaver, LCSW

## 2022-11-07 NOTE — Progress Notes (Signed)
Progress Note   Patient: Terry Sloan:[id number] DOB: [birth year] DOA: 08/08/2022     62 DOS: the patient was seen and examined on 11/07/2022   Brief hospital course: [name] is 82 year old male is a 82 year old male with history of dementia, behavioral disturbance, hyperlipidemia, BPH, insomnia, depression, anxiety, who presents to emergency department from [facility] rehab dementia department on 08/08/2022 for chief concerns of aggression with aggressive outburst towards staff. Presented to [name] ED on 2/24 due to agitation.  Was in the ED until 3/1 as he required SNF placement.  Sent to [place] [facility] and [name].  On 3/7 returned back to ER for confusion, combative with staff.  Was IVC in the ED.Behavioral health was consulted.  TOC was consulted for placement to SNF again. however, was declined by facilities due to need for as needed Haldol.  On 4/5 EDP requesting admission for hypotension, hypothermia and change in mentation.  Symptoms resolved by 4/6. Workup was unremarkable for any significant infectious etiology. Patient continues to be seen by psychiatry to help manage his agitation. During the hospitalization had parainfluenza viral infection on 4/27 treated conservatively. As of 5/2 has not required any as needed Haldol dose. Behavior appears to be well-controlled.  Not agitated.  Not combative.  06/06 : Patient is seen and examined at the bedside.  Awake.  Not agitated.  Assessment and Plan: Agitation, Dementia with behavioral disturbances: Followed by psychiatry, currently on depakote, haldol and trazodone , haldol/Ativan prn Has not required any doses of as needed Haldol Ativan Appreciate palliative care input Dr. Ashok Pall had lengthy conversation with daughter 6/2. She says patient has signed a dnr and affirms this wish, will make patient dnr. She is open to the idea of hospice.  Patient is less sedated today.  Appreciate psychiatry input.  Seroquel  discontinued. Valproic acid level therapeutic today, ammonia not elevated. Have decreased scheduled haldol dose with holding parameters.  Palliative to meet with daughter     Dysphagia Nursing concern , coughing with feeding -Appreciate speech therapy input " with his Dementia and severely fluctuating presentation (ESP. SEDATED) at any given time of the day, I would recommend a most conservative approach and diet if concerns for aspiration/pulmonary decline.  I would recommend a Dysphagia level 1(puree d/t Edentulous status) and NECTAR liquids for his diet. IF/WHEN he can become fully alert/calm for consistent periods of time and NOT need sedating medications daily, then ST services could be re-consulted to assess upgrade of diet.  any pt would be at increased risk for aspiration attempting to take po's when either agitated or sedated, so risk for aspiration/aspiration pneumonia is present for him".  Will start patient on diet since he is more awake       Parainfluenza infection. Treated conservatively.  Diagnosed on 4/27.   Hypotension, hypothermia. Resolved, no obvious infection   Recurrent bilateral lower extremity edema. Lower extremity duplex were negative.  5/25 significant edema, right >left 5/25 Lasix 20 mg p.o. daily for 3 days Monitor renal functions and urine output Patient was advised to keep legs elevated, and use stockings if possible. 5/26 lower extremity edema improved, venous duplex repeated negative for DVT   Restless leg; Continue Requip at nighttime.   Anxiety and depression. Continue Depakote and Seroquel.   Vitamin B12 and D deficiency. On oral supplements.   BPH. Continuing Flomax.   Fecal incontinence secondary to constipation Continue Bowel regimen.   Dehydration with hypernatremia Secondary to poor oral intake Gentle IV  fluid hydration Repeat electrolytes in a.m.            Subjective: Patient is seen and examined at the bedside.  He is  awake and alert.  No events overnight.  Physical Exam: Vitals:   11/06/22 0910 11/06/22 1456 11/06/22 1607 11/07/22 0837  BP: 113/60 119/70 123/76 120/75  Pulse: 79 78 77 75  Resp: 16 16 16 16   Temp: 98.5 F (36.9 C) 98.3 F (36.8 C) 98.8 F (37.1 C) 98 F (36.7 C)  TempSrc:  Oral    SpO2: 98% 97%  95%  Weight:      Height:      General exam: Appears calm and comfortable, not in any distress, awake Respiratory system: Clear to auscultation. Respiratory effort normal. Cardiovascular system: S1 & S2 heard, regular rate and rhythm, no murmur. Gastrointestinal system: Abdomen is soft, non tender, non distended, bowel sounds present Central nervous system: Awake, unable to follow commands Extremities: Not assessed Skin: No rashes, lesions or ulcers Psychiatry: Not assessed.  Data Reviewed: Labs reviewed.  Sodium 146, BUN 55, creatinine 1.37 up from his baseline of 1.06 There are no new results to review at this time.  Family Communication:   Disposition: Status is: Inpatient Remains inpatient appropriate because: Awaiting placement  Planned Discharge Destination: Skilled nursing facility    Time spent: 35 minutes  Author: [name] Shutters, MD 11/07/2022 11:15 AM  For on call review [email or web address].

## 2022-11-08 LAB — BASIC METABOLIC PANEL
Anion gap: 10 (ref 5–15)
BUN: 58 mg/dL — ABNORMAL HIGH (ref 8–23)
CO2: 26 mmol/L (ref 22–32)
Calcium: 9.4 mg/dL (ref 8.9–10.3)
Chloride: 111 mmol/L (ref 98–111)
Creatinine, Ser: 1.48 mg/dL — ABNORMAL HIGH (ref 0.61–1.24)
GFR, Estimated: 47 mL/min — ABNORMAL LOW (ref >60)
Glucose, Bld: 115 mg/dL — ABNORMAL HIGH (ref 70–99)
Potassium: 4.2 mmol/L (ref 3.5–5.1)
Sodium: 147 mmol/L — ABNORMAL HIGH (ref 135–145)

## 2022-11-08 MED ORDER — DEXTROSE 5 % IV SOLN: INTRAVENOUS | Status: DC

## 2022-11-08 MED ORDER — MORPHINE SULFATE (PF) 2 MG/ML IV SOLN
2.0000 mg | INTRAVENOUS | Status: DC | PRN
  Filled 2022-11-08: qty 1

## 2022-11-08 NOTE — Progress Notes (Signed)
Progress Note   Patient: Terry Sloan:[id number] DOB: 13-Feb-1941 DOA: 08/08/2022     63 DOS: the patient was seen and examined on 11/08/2022   Brief hospital course: Mr. Belmont Valli is 82 year old male is a 82 year old male with history of dementia, behavioral disturbance, hyperlipidemia, BPH, insomnia, depression, anxiety, who presents to emergency department from [facility] rehab dementia department on 08/08/2022 for chief concerns of aggression with aggressive outburst towards staff. Presented to [name] ED on 2/24 due to agitation.  Was in the ED until 3/1 as he required SNF placement.  Sent to [facility] and [name].  On 3/7 returned back to ER for confusion, combative with staff.  Was IVC in the ED.Behavioral health was consulted.  TOC was consulted for placement to SNF again. however, was declined by facilities due to need for as needed Haldol.   On 4/5 EDP requesting admission for hypotension, hypothermia and change in mentation.  Symptoms resolved by 4/6. Workup was unremarkable for any significant infectious etiology. Patient continues to be seen by psychiatry to help manage his agitation. During the hospitalization had parainfluenza viral infection on 4/27 treated conservatively. As of 5/2 has not required any as needed Haldol dose. Behavior appears to be well-controlled.  Not agitated.  Not combative.   06/06 : Patient is seen and examined at the bedside.  Awake.  Not agitated.   06/07 : Has not required any PRN anxiolytic or antipsychotic medication in the past 1 month.  Assessment and Plan:  Agitation, Dementia with behavioral disturbances: Followed by psychiatry, currently on depakote, haldol and trazodone , haldol/Ativan prn  Has not required any as needed doses of as needed Haldol Ativan Appreciate palliative care input Dr. Ashok Pall had lengthy conversation with daughter 6/2. She says patient has signed a dnr and affirms this wish, will make patient dnr. She is  open to the idea of hospice.  Patient is less sedated 06/06.  Appreciate psychiatry input.  Seroquel discontinued. Valproic acid level therapeutic today, ammonia not elevated. Have decreased scheduled haldol dose with holding parameters.  Palliative to meet with daughter     Dysphagia Nursing concern , coughing with feeding -Appreciate speech therapy input " with his Dementia and severely fluctuating presentation (ESP. SEDATED) at any given time of the day, I would recommend a most conservative approach and diet if concerns for aspiration/pulmonary decline.  I would recommend a Dysphagia level 1(puree d/t Edentulous status) and NECTAR liquids for his diet. IF/WHEN he can become fully alert/calm for consistent periods of time and NOT need sedating medications daily, then ST services could be re-consulted to assess upgrade of diet.  any pt would be at increased risk for aspiration attempting to take po's when either agitated or sedated, so risk for aspiration/aspiration pneumonia is present for him".  Patient has been started on a dysphagia 3 diet with nectar thick liquids and requires assistance with meals. Strict aspiration precaution       Parainfluenza infection. Treated conservatively.  Diagnosed on 4/27.    Hypotension, hypothermia. Resolved, no obvious infection    Recurrent bilateral lower extremity edema. Lower extremity duplex were negative.  5/25 significant edema, right >left 5/25 Lasix 20 mg p.o. daily for 3 days Monitor renal functions and urine output Patient was advised to keep legs elevated, and use stockings if possible. 5/26 lower extremity edema improved, venous duplex repeated negative for DVT   Restless leg; Continue Requip at nighttime.   Anxiety and depression. Continue Depakote and Seroquel.  Vitamin B12 and D deficiency. On oral supplements.   BPH. Continuing Flomax.   Fecal incontinence secondary to constipation Continue Bowel regimen.      Dehydration with hypernatremia Secondary to poor oral intake Gentle IV fluid hydration Repeat electrolytes in a.m.       Subjective: Patient is seen and examined at the bedside.  No events overnight  Physical Exam: Vitals:   11/07/22 0837 11/07/22 2341 11/08/22 0434 11/08/22 0745  BP: 120/75 97/69 129/86 138/80  Pulse: 75 75 71 74  Resp: 16 20 16 16   Temp: 98 F (36.7 C) 98.8 F (37.1 C) 98.4 F (36.9 C) (!) 97 F (36.1 C)  TempSrc:  Oral    SpO2: 95% 97% 99% 99%  Weight:      Height:      General exam: Appears calm and comfortable, not in any distress, awake Respiratory system: Clear to auscultation. Respiratory effort normal. Cardiovascular system: S1 & S2 heard, regular rate and rhythm, no murmur. Gastrointestinal system: Abdomen is soft, non tender, non distended, bowel sounds present Central nervous system: Awake, unable to follow commands Extremities: Not assessed Skin: No rashes, lesions or ulcers Psychiatry: Not assessed.  Data Reviewed: Labs reviewed.  Uptrending sodium level as well as BUN and creatinine.  Sodium 147, BUN 58 and creatinine 1.48 There are no new results to review at this time.  Family Communication: None  Disposition: Status is: Inpatient Remains inpatient appropriate because: Awaiting placement  Planned Discharge Destination: Skilled nursing facility    Time spent: 30 minutes  Author: [name] Shutters, MD 11/08/2022 11:00 AM  For on call review [email or web address].

## 2022-11-08 NOTE — Progress Notes (Addendum)
Daily Progress Note   Patient Name: Terry Sloan       Date: 11/08/2022 DOB: 1940/07/26  Age: 82 y.o. MRN#: [id number] Attending Physician: [name] Shutters, MD Primary Care Physician: Pcp, No Admit Date: 08/08/2022  Reason for Consultation/Follow-up: Establishing goals of care  Subjective: Notes and labs reviewed.  In to see patient.  He is currently sitting in bed.  He does not open his eyes to touch or voice.  No family at bedside.  Stepped out to call patient's daughter.  She had her brother sitting with her on speaker phone in addition to her brother's wife.   We discussed his diagnosis, prognosis, GOC, EOL wishes disposition and options.  Created space and opportunity for patient  to explore thoughts and feelings regarding current medical information.   A detailed discussion was had today regarding advanced directives.  Concepts specific to code status, artifical feeding and hydration, IV antibiotics and rehospitalization were discussed.  The difference between an aggressive medical intervention path and a comfort care path was discussed.  Values and goals of care important to patient and family were attempted to be elicited.  Discussed limitations of medical interventions to prolong quality of life in some situations and discussed the concept of human mortality.  We discussed his hospitalization and his current status.  We discussed IV fluid due to poor p.o. intake.  We discussed acceptable quality of life to him.  Family would like to proceed with comfort care and discharged to hosp[facility] of [facility] if possible, as they live in [place].  Team has been made aware.  PMT will follow-up Tuesday on return to service.   Length of Stay: 24  Current Medications: Scheduled Meds:   divalproex   750 mg Oral Q12H   donepezil  5 mg Oral Daily   feeding supplement  237 mL Oral BID BM   haloperidol  4 mg Oral BID   melatonin  5 mg Oral QHS   polyethylene glycol  17 g Oral Daily   rOPINIRole  0.5 mg Oral QHS   tamsulosin  0.4 mg Oral QPC supper   traZODone  150 mg Oral QHS    Continuous Infusions:   PRN Meds: acetaminophen, bisacodyl, diphenhydrAMINE, guaiFENesin-dextromethorphan, haloperidol **OR** haloperidol lactate, hydrALAZINE, ipratropium-albuterol, lidocaine, LORazepam, morphine, ziprasidone  Physical Exam Constitutional:  Comments: Eyes closed  Pulmonary:     Effort: Pulmonary effort is normal.             Vital Signs: BP 138/80 (BP Location: Right Arm)   Pulse 74   Temp (!) 97 F (36.1 C)   Resp 16   Ht 5\' 10"  (1.778 m)   Wt 78.2 kg   SpO2 99%   BMI 24.74 kg/m  SpO2: SpO2: 99 % O2 Device: O2 Device: Room Air O2 Flow Rate: O2 Flow Rate (L/min): 2 L/min  Intake/output summary:  Intake/Output Summary (Last 24 hours) at 11/08/2022 1443 Last data filed at 11/08/2022 0535 Gross per 24 hour  Intake 120 ml  Output 450 ml  Net -330 ml   LBM: Last BM Date : 11/07/22 Baseline Weight: Weight: 81 kg Most recent weight: Weight: 78.2 kg   Patient Active Problem List   Diagnosis Date Noted   Parainfluenza infection 09/29/2022   Vitamin B12 deficiency 09/12/2022   Altered mental status 09/06/2022   Hypothermia 09/06/2022   Restless leg 09/06/2022   Dementia with behavioral disturbance (HCC) 07/27/2022   OAB (overactive bladder) 01/25/2019   BPH (benign prostatic hyperplasia) 07/27/2018   History of total left knee replacement 04/17/2017   History of TIA (transient ischemic attack) 10/16/2016   Aphasia 10/16/2016   Anxiety and depression 10/16/2016   PVD (peripheral vascular disease) (HCC) 09/23/2016   Mixed hyperlipidemia 09/23/2016   Weight loss, unintentional 09/23/2016    Palliative Care Assessment & Plan    Recommendations/Plan: Family would  like hospice of [facility].  MD and TOC made aware via epic chat. PMT will follow up Tuesday on return to service.  Please reach out if needs arise over the weekend.  Code Status:    Code Status Orders  (From admission, onward)           Start     Ordered   11/03/22 1535  Do not attempt resuscitation (DNR)  Continuous       Question Answer Comment  If patient has no pulse and is not breathing Do Not Attempt Resuscitation   If patient has a pulse and/or is breathing: Medical Treatment Goals LIMITED ADDITIONAL INTERVENTIONS: Use medication/IV fluids and cardiac monitoring as indicated; Do not use intubation or mechanical ventilation (DNI), also provide comfort medications.  Transfer to Progressive/Stepdown as indicated, avoid Intensive Care.   Consent: Discussion documented in EHR or advanced directives reviewed      11/03/22 1535           Code Status History     Date Active Date Inactive Code Status Order ID Comments User Context   09/06/2022 1610 11/03/2022 1535 Full Code [id number]  Lovenia Kim, DO ED   08/08/2022 1252 09/06/2022 1610 Full Code [id number]  Pilar Jarvis, MD ED   07/27/2022 1256 08/02/2022 1825 Full Code [id number]  Bethann Berkshire, MD ED   02/14/2017 1652 02/17/2017 1523 Full Code [id number]  Naida Sleight, PA-C Inpatient      Advance Directive Documentation    Flowsheet Row Most Recent Value  Type of Advance Directive Healthcare Power of Attorney  Pre-existing out of facility DNR order (yellow form or pink MOST form) --  "MOST" Form in Place? --       Prognosis:  < 2 weeks    Thank you for allowing the Palliative Medicine Team to assist in the care of this patient.   Morton Stall, NP  Please contact Palliative Medicine Team phone at [phone] for questions  and concerns.

## 2022-11-08 NOTE — TOC Progression Note (Addendum)
Transition of Care [facility]) - Progression Note    Patient Details  Name: Terry Sloan MRN: [id number] Date of Birth: 17-Feb-1941  [facility] CM/SW Contact  Darleene Cleaver, [place] Phone Number: 11/08/2022, 1:39 PM  Clinical Narrative:     Palliative meeting with family today at 2pm to determine goals of care.  2:45pm  CSW received message from palliative that patient's family would like the hospice facility in [facility] which is now called [facility].  CSW contacted patient's daughter [name] and confirmed they would like the hospice facility in [place], [facility],  she said yes.    CSW then contacted Gertie Exon, [phone] and spoke to [facility] in admissions she said her medical director will have to review patient's information, and the medical director is not available until Monday.    CSW updated attending physician, bedside nurse, and patient's daughter.  TOC to follow up with hospice agency on Monday.  Expected Discharge Plan:  (TBD) Barriers to Discharge: Continued Medical Work up  Expected Discharge Plan and Services  Hospice facility if approved beds are available.                                             Social Determinants of Health (SDOH) Interventions SDOH Screenings   Food Insecurity: Patient Unable To Answer (09/07/2022)  Housing: Low Risk  (09/07/2022)  Transportation Needs: Patient Unable To Answer (09/07/2022)  Utilities: Not At Risk (07/23/2022)  Alcohol Screen: Low Risk  (07/23/2022)  Depression (PHQ2-9): Low Risk  (07/23/2022)  Recent Concern: Depression (PHQ2-9) - Medium Risk (06/17/2022)  Financial Resource Strain: Low Risk  (07/23/2022)  Physical Activity: Insufficiently Active (07/23/2022)  Social Connections: Socially Isolated (07/23/2022)  Stress: No Stress Concern Present (07/23/2022)  Tobacco Use: Medium Risk (09/11/2022)    Readmission Risk Interventions     No data to display

## 2022-11-09 NOTE — Progress Notes (Signed)
Progress Note   Patient: CHRLES BUCIO ZOX:[id number] DOB: [birth year] DOA: 08/08/2022     64 DOS: the patient was seen and examined on 11/09/2022   Brief hospital course: Mr. Ulric Usey is 82 year old male is a 82 year old male with history of dementia, behavioral disturbance, hyperlipidemia, BPH, insomnia, depression, anxiety, who presents to emergency department from [facility] rehab dementia department on 08/08/2022 for chief concerns of aggression with aggressive outburst towards staff. [facility]ed to [name] on 2/24 due to agitation.  Was in the ED until 3/1 as he required SNF placement.  Sent to [facility] and [name].  On 3/7 returned back to ER for confusion, combative with staff.  Was IVC in the ED.Behavioral health was consulted.  TOC was consulted for placement to SNF again. however, was declined by facilities due to need for as needed Haldol.   On 4/5 EDP requesting admission for hypotension, hypothermia and change in mentation.  Symptoms resolved by 4/6. Workup was unremarkable for any significant infectious etiology. Patient continues to be seen by psychiatry to help manage his agitation. During the hospitalization had parainfluenza viral infection on 4/27 treated conservatively. As of 5/2 has not required any as needed Haldol dose. Behavior appears to be well-controlled.  Not agitated.  Not combative.   06/06 : Patient is seen and examined at the bedside.  Awake.  Not agitated.   06/07 : Has not required any PRN anxiolytic or antipsychotic medication in the past 1 month.  06/08: Palliative care met with patient's family and they have opted for comfort care with discharge to an inpatient hospice in [place].  [facility] aware  Assessment and Plan:  Agitation, Dementia with behavioral disturbances: Patient noted to be very lethargic Followed by psychiatry, currently on depakote, haldol and trazodone , haldol/Ativan prn  Has not required any as needed doses of as needed  Haldol Ativan Appreciate palliative care input Palliative care had discussed with patient's family who has opted for comfort measures and inpatient hospice upon discharge.       Dysphagia Nursing concern , coughing with feeding. Patient is currently on comfort measures and may have pleasure feeds as tolerated      Parainfluenza infection. Treated conservatively.  Diagnosed on 4/27.     Hypotension, hypothermia. Resolved, no obvious infection     Recurrent bilateral lower extremity edema. Lower extremity duplex were negative.  5/25 significant edema, right >left 5/25 Lasix 20 mg p.o. daily for 3 days Monitor renal functions and urine output Patient was advised to keep legs elevated, and use stockings if possible. 5/26 lower extremity edema improved, venous duplex repeated negative for DVT   Restless leg; Continue Requip at nighttime.   Anxiety and depression. Continue Depakote and Seroquel.   Vitamin B12 and D deficiency. On oral supplements.   BPH. Continuing Flomax.   Fecal incontinence secondary to constipation Continue Bowel regimen.     Dehydration with hypernatremia Currently on comfort measures      Subjective: Patient is seen and examined at the bedside.  Lethargic  Physical Exam: Vitals:   11/08/22 0434 11/08/22 0745 11/08/22 1937 11/09/22 0755  BP: 129/86 138/80 101/67 137/81  Pulse: 71 74 75 77  Resp: 16 16 16 16   Temp: 98.4 F (36.9 C) (!) 97 F (36.1 C) 97.8 F (36.6 C) 98.6 F (37 C)  TempSrc:      SpO2: 99% 99% 95% 97%  Weight:      Height:       General  exam: Lethargic Respiratory system: Clear to auscultation. Respiratory effort normal. Cardiovascular system: S1 & S2 heard, regular rate and rhythm, no murmur. Gastrointestinal system: Abdomen is soft, non tender, non distended, bowel sounds present Central nervous system: Lethargic Extremities: Not assessed Skin: No rashes, lesions or ulcers Psychiatry: Not assessed.   Data  Reviewed:  There are no new results to review at this time.  Family Communication: None  Disposition: Status is: Inpatient Remains inpatient appropriate because: Awaiting patient hospice  Planned Discharge Destination:  Inpatient hospice    Time spent: 20 minutes  Author: [name] Shutters, MD 11/09/2022 12:56 PM  For on call review [email or web address].

## 2022-11-09 NOTE — Plan of Care (Signed)
  Problem: Fluid Volume: Goal: Hemodynamic stability will improve Outcome: Not Progressing   Problem: Clinical Measurements: Goal: Diagnostic test results will improve Outcome: Not Progressing Goal: Signs and symptoms of infection will decrease Outcome: Not Progressing   Problem: Respiratory: Goal: Ability to maintain adequate ventilation will improve Outcome: Not Progressing   Problem: Education: Goal: Knowledge of General Education information will improve Description: Including pain rating scale, medication(s)/side effects and non-pharmacologic comfort measures Outcome: Not Progressing   Problem: Health Behavior/Discharge Planning: Goal: Ability to manage health-related needs will improve Outcome: Not Progressing   Problem: Clinical Measurements: Goal: Ability to maintain clinical measurements within normal limits will improve Outcome: Not Progressing Goal: Will remain free from infection Outcome: Not Progressing Goal: Diagnostic test results will improve Outcome: Not Progressing Goal: Respiratory complications will improve Outcome: Not Progressing Goal: Cardiovascular complication will be avoided Outcome: Not Progressing   Problem: Activity: Goal: Risk for activity intolerance will decrease Outcome: Not Progressing   Problem: Nutrition: Goal: Adequate nutrition will be maintained Outcome: Not Progressing   Problem: Coping: Goal: Level of anxiety will decrease Outcome: Not Progressing   Problem: Elimination: Goal: Will not experience complications related to bowel motility Outcome: Not Progressing Goal: Will not experience complications related to urinary retention Outcome: Not Progressing   Problem: Pain Managment: Goal: General experience of comfort will improve Outcome: Not Progressing   Problem: Safety: Goal: Ability to remain free from injury will improve Outcome: Not Progressing   Problem: Skin Integrity: Goal: Risk for impaired skin integrity  will decrease Outcome: Not Progressing   Problem: Education: Goal: Knowledge of General Education information will improve Description: Including pain rating scale, medication(s)/side effects and non-pharmacologic comfort measures Outcome: Not Progressing   Problem: Clinical Measurements: Goal: Ability to maintain clinical measurements within normal limits will improve Outcome: Not Progressing   Problem: Safety: Goal: Ability to remain free from injury will improve Outcome: Not Progressing   Problem: Skin Integrity: Goal: Risk for impaired skin integrity will decrease Outcome: Not Progressing

## 2022-11-10 NOTE — Progress Notes (Signed)
  Progress Note   Patient: Terry Sloan YNW:[id number] DOB: 10-Sep-1940 DOA: 08/08/2022     65 DOS: the patient was seen and examined on 11/10/2022   Brief hospital course: Mr. Zameer Borman is 82 year old male is a 82 year old male with history of dementia, behavioral disturbance, hyperlipidemia, BPH, insomnia, depression, anxiety, who presents to emergency department from [facility] rehab dementia department on 08/08/2022 for chief concerns of aggression with aggressive outburst towards staff. Presented to [name], ED on 2/24 due to agitation.  Was in the ED until 3/1 as he required SNF placement.  Sent to [facility] rehab and [name].  On 3/7 returned back to ER for confusion, combative with staff.  Was IVC in the ED.Behavioral health was consulted.  TOC was consulted for placement to SNF again. however, was declined by facilities due to need for as needed Haldol.   On 4/5 EDP requesting admission for hypotension, hypothermia and change in mentation.  Symptoms resolved by 4/6. Workup was unremarkable for any significant infectious etiology. Patient continues to be seen by psychiatry to help manage his agitation. During the hospitalization had parainfluenza viral infection on 4/27 treated conservatively. As of 5/2 has not required any as needed Haldol dose. Behavior appears to be well-controlled.  Not agitated.  Not combative.   06/06 : Patient is seen and examined at the bedside.  Awake.  Not agitated.   06/07 : Has not required any PRN anxiolytic or antipsychotic medication in the past 1 month.   06/08: Palliative care met with patient's family and they have opted for comfort care with discharge to an inpatient hospice in [place].  [facility] aware  Assessment and Plan: Patient remains very lethargic with minimal response to verbal stimuli.  Opens eyes to deep sternal rub. Has not required any medications for agitation Awaiting placement in inpatient hospice      Subjective: Patient is  seen and examined at the bedside.  Lethargic.  Opens eyes to deep sternal rub  Physical Exam: Vitals:   11/08/22 0745 11/08/22 1937 11/09/22 0755 11/09/22 1955  BP: 138/80 101/67 137/81 (!) 138/92  Pulse: 74 75 77 83  Resp: 16 16 16 18   Temp: (!) 97 F (36.1 C) 97.8 F (36.6 C) 98.6 F (37 C) 98.4 F (36.9 C)  TempSrc:      SpO2: 99% 95% 97% 96%  Weight:      Height:      General exam: Lethargic Respiratory system: Clear to auscultation. Respiratory effort normal. Cardiovascular system: S1 & S2 heard, regular rate and rhythm, no murmur. Gastrointestinal system: Abdomen is soft, non tender, non distended, bowel sounds present Central nervous system: Lethargic Extremities: Not assessed Skin: No rashes, lesions or ulcers Psychiatry: Not assessed.  Data Reviewed:  There are no new results to review at this time.  Family Communication: None  Disposition: Status is: Inpatient Remains inpatient appropriate because: Awaiting discharge to inpatient hospice  Planned Discharge Destination:  Inpatient hospice    Time spent: 20 minutes  Author: [name] Shutters, MD 11/10/2022 10:53 AM  For on call review [email or web address].

## 2022-11-11 NOTE — Discharge Summary (Signed)
Physician Discharge Summary   Patient: Terry Sloan MRN: [id number] DOB: December 17, 1940  Admit date:     08/08/2022  Discharge date: 11/11/22  Discharge Physician: Caid Radin   PCP: Pcp, No   Recommendations at discharge:   Patient has been discharged to inpatient hospice  Discharge Diagnoses: Principal Problem:   Dementia with behavioral disturbance (HCC) Active Problems:   Mixed hyperlipidemia   History of TIA (transient ischemic attack)   Anxiety and depression   BPH (benign prostatic hyperplasia)   OAB (overactive bladder)   Altered mental status   Hypothermia   Restless leg   Vitamin B12 deficiency   Parainfluenza infection  Resolved Problems:   * No resolved hospital problems. [facility] Course: Terry Sloan is 82 year old male is a 82 year old male with history of dementia, behavioral disturbance, hyperlipidemia, BPH, insomnia, depression, anxiety, who presents to emergency department from [facility] rehab dementia department on 08/08/2022 for chief concerns of aggression with aggressive outburst towards staff. [facility]ed to [name] ED on 2/24 due to agitation.  Was in the ED until 3/1 as he required SNF placement.  Sent to [name].  On 3/7 returned back to ER for confusion, combative with staff.  Was IVC in the ED.Behavioral health was consulted.  TOC was consulted for placement to SNF again. however, was declined by facilities due to need for as needed Haldol.   On 4/5 EDP requesting admission for hypotension, hypothermia and change in mentation.  Symptoms resolved by 4/6. Workup was unremarkable for any significant infectious etiology. Patient continues to be seen by psychiatry to help manage his agitation. During the hospitalization had parainfluenza viral infection on 4/27 treated conservatively. As of 5/2 has not required any as needed Haldol dose. Behavior appears to be well-controlled.  Not agitated.  Not combative.   06/06 :  Patient is seen and examined at the bedside.  Awake.  Not agitated.   06/07 : Has not required any PRN anxiolytic or antipsychotic medication in the past 1 month.   06/08: Palliative care met with patient's family and they have opted for comfort care with discharge to an inpatient hospice in [place].  [facility] aware  Assessment and Plan: Patient with a prolonged hospital stay due to dementia with behavioral disturbances.  Was seen and evaluated by psychiatry and was started on medication to help with his aggressive behavior. Hospital course was complicated by development of parainfluenza infection which was treated conservatively. Patient has been increasingly lethargic and has not required any of his as needed antipsychotic medications.  He has not even been awake enough to take any of his scheduled oral meds. He was seen and evaluated by palliative care who had several conversations with patient's daughter.  He has been on comfort measures and will be discharged to an inpatient hospice today.       Consultants: Palliative care, psychiatry Procedures performed: None Disposition: Hospice care Diet recommendation:  NPO   DISCHARGE MEDICATION: Allergies as of 11/11/2022       Reactions   Celebrex [celecoxib] Other (See Comments)   Leg swelling, broke out in rash        Medication List     STOP taking these medications    acetaminophen 325 MG tablet Commonly known as: TYLENOL   donepezil 5 MG disintegrating tablet Commonly known as: ARICEPT ODT   haloperidol lactate 5 MG/ML injection Commonly known as: HALDOL   HYDROXYZINE PAMOATE PO   LORazepam 1 MG tablet  Commonly known as: ATIVAN   melatonin 3 MG Tabs tablet   OLANZapine 10 MG tablet Commonly known as: ZYPREXA   OLANZapine 15 MG tablet Commonly known as: ZYPREXA   pravastatin 40 MG tablet Commonly known as: PRAVACHOL   tamsulosin 0.4 MG Caps capsule Commonly known as: FLOMAX        Discharge  Exam: Filed Weights   08/08/22 1205 08/14/22 0038 09/07/22 0057  Weight: 81 kg 78.2 kg 78.2 kg   General exam: Lethargic Respiratory system: Clear to auscultation. Respiratory effort normal. Cardiovascular system: S1 & S2 heard, regular rate and rhythm, no murmur. Gastrointestinal system: Abdomen is soft, non tender, non distended, bowel sounds present Central nervous system: Lethargic Extremities: Not assessed Skin: No rashes, lesions or ulcers Psychiatry: Not assessed.  Condition at discharge: poor  The results of significant diagnostics from this hospitalization (including imaging, microbiology, ancillary and laboratory) are listed below for reference.   Imaging Studies: US Venous Img Lower Bilateral (DVT)  Result Date: 10/26/2022 CLINICAL DATA:  Bilateral lower extremity edema, right-greater-than-left. Evaluate for DVT. EXAM: BILATERAL LOWER EXTREMITY VENOUS DOPPLER ULTRASOUND TECHNIQUE: Gray-scale sonography with graded compression, as well as color Doppler and duplex ultrasound were performed to evaluate the lower extremity deep venous systems from the level of the common femoral vein and including the common femoral, femoral, profunda femoral, popliteal and calf veins including the posterior tibial, peroneal and gastrocnemius veins when visible. The superficial great saphenous vein was also interrogated. Spectral Doppler was utilized to evaluate flow at rest and with distal augmentation maneuvers in the common femoral, femoral and popliteal veins. COMPARISON:  Bilateral lower extremity venous Doppler ultrasound-10/14/2022 (negative); right lower extremity venous Doppler ultrasound-12/10/2021 (negative) FINDINGS: RIGHT LOWER EXTREMITY Common Femoral Vein: No evidence of thrombus. Normal compressibility, respiratory phasicity and response to augmentation. Saphenofemoral Junction: No evidence of thrombus. Normal compressibility and flow on color Doppler imaging. Profunda Femoral Vein: No  evidence of thrombus. Normal compressibility and flow on color Doppler imaging. Femoral Vein: No evidence of thrombus. Normal compressibility, respiratory phasicity and response to augmentation. Popliteal Vein: No evidence of thrombus. Normal compressibility, respiratory phasicity and response to augmentation. Calf Veins: No evidence of thrombus. Normal compressibility and flow on color Doppler imaging. Superficial Great Saphenous Vein: No evidence of thrombus. Normal compressibility. Other Findings: There is a minimal amount of subcutaneous edema at the level of the right calf. Scattered atherosclerotic plaque throughout the incidentally imaged right lower extremity arterial vasculature. LEFT LOWER EXTREMITY Common Femoral Vein: No evidence of thrombus. Normal compressibility, respiratory phasicity and response to augmentation. Saphenofemoral Junction: No evidence of thrombus. Normal compressibility and flow on color Doppler imaging. Profunda Femoral Vein: No evidence of thrombus. Normal compressibility and flow on color Doppler imaging. Femoral Vein: No evidence of thrombus. Normal compressibility, respiratory phasicity and response to augmentation. Popliteal Vein: No evidence of thrombus. Normal compressibility, respiratory phasicity and response to augmentation. Calf Veins: No evidence of thrombus. Normal compressibility and flow on color Doppler imaging. Superficial Great Saphenous Vein: No evidence of thrombus. Normal compressibility. Other Findings: There is a minimal amount of subcutaneous edema at the level of the left calf. Scattered atherosclerotic plaque throughout the incidentally imaged left lower extremity arterial vasculature. IMPRESSION: No evidence of DVT within either lower extremity. Electronically Signed   By: Simonne Come M.D.   On: 10/26/2022 16:46   DG Abd 1 View  Result Date: 10/16/2022 CLINICAL DATA:  [id number] Constipation [id number] EXAM: ABDOMEN - 1 VIEW COMPARISON:  None Available.  FINDINGS: Nonobstructive bowel  gas pattern. Moderate colonic stool burden. Calcification overlying the right iliac bone corresponds to calcifications seen on prior CT in October 2011. No radiopaque calculi overlie the kidneys. Degenerative changes of the spine. IMPRESSION: No evidence of bowel obstruction.  Moderate colonic stool burden. Electronically Signed   By: Caprice Renshaw M.D.   On: 10/16/2022 13:07   US Venous Img Lower Bilateral (DVT)  Result Date: 10/14/2022 CLINICAL DATA:  Bilateral lower extremity edema. EXAM: BILATERAL LOWER EXTREMITY VENOUS DOPPLER ULTRASOUND TECHNIQUE: Gray-scale sonography with graded compression, as well as color Doppler and duplex ultrasound were performed to evaluate the lower extremity deep venous systems from the level of the common femoral vein and including the common femoral, femoral, profunda femoral, popliteal and calf veins including the posterior tibial, peroneal and gastrocnemius veins when visible. The superficial great saphenous vein was also interrogated. Spectral Doppler was utilized to evaluate flow at rest and with distal augmentation maneuvers in the common femoral, femoral and popliteal veins. COMPARISON:  None Available. FINDINGS: RIGHT LOWER EXTREMITY Common Femoral Vein: No evidence of thrombus. Normal compressibility, respiratory phasicity and response to augmentation. Saphenofemoral Junction: No evidence of thrombus. Normal compressibility and flow on color Doppler imaging. Profunda Femoral Vein: No evidence of thrombus. Normal compressibility and flow on color Doppler imaging. Femoral Vein: No evidence of thrombus. Normal compressibility, respiratory phasicity and response to augmentation. Popliteal Vein: No evidence of thrombus. Normal compressibility, respiratory phasicity and response to augmentation. Calf Veins: No evidence of thrombus. Normal compressibility and flow on color Doppler imaging. Superficial Great Saphenous Vein: No evidence of thrombus.  Normal compressibility. Venous Reflux:  None. Other Findings: No evidence of superficial thrombophlebitis or abnormal fluid collection. LEFT LOWER EXTREMITY Common Femoral Vein: No evidence of thrombus. Normal compressibility, respiratory phasicity and response to augmentation. Saphenofemoral Junction: No evidence of thrombus. Normal compressibility and flow on color Doppler imaging. Profunda Femoral Vein: No evidence of thrombus. Normal compressibility and flow on color Doppler imaging. Femoral Vein: No evidence of thrombus. Normal compressibility, respiratory phasicity and response to augmentation. Popliteal Vein: No evidence of thrombus. Normal compressibility, respiratory phasicity and response to augmentation. Calf Veins: No evidence of thrombus. Normal compressibility and flow on color Doppler imaging. Superficial Great Saphenous Vein: No evidence of thrombus. Normal compressibility. Venous Reflux:  None. Other Findings: No evidence of superficial thrombophlebitis or abnormal fluid collection. IMPRESSION: No evidence of deep venous thrombosis in either lower extremity. Electronically Signed   By: Irish Lack M.D.   On: 10/14/2022 09:03    Microbiology: Results for orders placed or performed during the hospital encounter of 08/08/22  Resp panel by RT-PCR (RSV, Flu A&B, Covid) Anterior Nasal Swab     Status: None   Collection Time: 08/25/22  5:32 AM   Specimen: Anterior Nasal Swab  Result Value Ref Range Status   SARS Coronavirus 2 by RT PCR NEGATIVE NEGATIVE Final    Comment: (NOTE) SARS-CoV-2 target nucleic acids are NOT DETECTED.  The SARS-CoV-2 RNA is generally detectable in upper respiratory specimens during the acute phase of infection. The lowest concentration of SARS-CoV-2 viral copies this assay can detect is 138 copies/mL. A negative result does not preclude SARS-Cov-2 infection and should not be used as the sole basis for treatment or other patient management decisions. A negative  result may occur with  improper specimen collection/handling, submission of specimen other than nasopharyngeal swab, presence of viral mutation(s) within the areas targeted by this assay, and inadequate number of viral copies(<138 copies/mL). A negative result must be  combined with clinical observations, patient history, and epidemiological information. The expected result is Negative.  Fact Sheet for Patients:  BloggerCourse.com  Fact Sheet for Healthcare Providers:  SeriousBroker.it  This test is no t yet approved or cleared by the [place] FDA and  has been authorized for detection and/or diagnosis of SARS-CoV-2 by FDA under an Emergency Use Authorization (EUA). This EUA will remain  in effect (meaning this test can be used) for the duration of the COVID-19 declaration under Section 564(b)(1) of the Act, 21 U.S.C.section 360bbb-3(b)(1), unless the authorization is terminated  or revoked sooner.       Influenza A by PCR NEGATIVE NEGATIVE Final   Influenza B by PCR NEGATIVE NEGATIVE Final    Comment: (NOTE) The Xpert Xpress SARS-CoV-2/FLU/RSV plus assay is intended as an aid in the diagnosis of influenza from Nasopharyngeal swab specimens and should not be used as a sole basis for treatment. Nasal washings and aspirates are unacceptable for Xpert Xpress SARS-CoV-2/FLU/RSV testing.  Fact Sheet for Patients: BloggerCourse.com  Fact Sheet for Healthcare Providers: SeriousBroker.it  This test is not yet approved or cleared by the [place] FDA and has been authorized for detection and/or diagnosis of SARS-CoV-2 by FDA under an Emergency Use Authorization (EUA). This EUA will remain in effect (meaning this test can be used) for the duration of the COVID-19 declaration under Section 564(b)(1) of the Act, 21 U.S.C. section 360bbb-3(b)(1), unless the authorization is  terminated or revoked.     Resp Syncytial Virus by PCR NEGATIVE NEGATIVE Final    Comment: (NOTE) Fact Sheet for Patients: BloggerCourse.com  Fact Sheet for Healthcare Providers: SeriousBroker.it  This test is not yet approved or cleared by the [place] FDA and has been authorized for detection and/or diagnosis of SARS-CoV-2 by FDA under an Emergency Use Authorization (EUA). This EUA will remain in effect (meaning this test can be used) for the duration of the COVID-19 declaration under Section 564(b)(1) of the Act, 21 U.S.C. section 360bbb-3(b)(1), unless the authorization is terminated or revoked.  Performed at [facility], [address] [place], [place] [address]   SARS Coronavirus 2 by RT PCR (hospital order, performed in [facility] hospital lab) *cepheid single result test* Anterior Nasal Swab     Status: None   Collection Time: 09/06/22  7:35 AM   Specimen: Anterior Nasal Swab  Result Value Ref Range Status   SARS Coronavirus 2 by RT PCR NEGATIVE NEGATIVE Final    Comment: (NOTE) SARS-CoV-2 target nucleic acids are NOT DETECTED.  The SARS-CoV-2 RNA is generally detectable in upper and lower respiratory specimens during the acute phase of infection. The lowest concentration of SARS-CoV-2 viral copies this assay can detect is 250 copies / mL. A negative result does not preclude SARS-CoV-2 infection and should not be used as the sole basis for treatment or other patient management decisions.  A negative result may occur with improper specimen collection / handling, submission of specimen other than nasopharyngeal swab, presence of viral mutation(s) within the areas targeted by this assay, and inadequate number of viral copies (<250 copies / mL). A negative result must be combined with clinical observations, patient history, and epidemiological information.  Fact Sheet for Patients:    RoadLapTop.co.za  Fact Sheet for Healthcare Providers: http://kim-miller.com/  This test is not yet approved or  cleared by the [place] FDA and has been authorized for detection and/or diagnosis of SARS-CoV-2 by FDA under an Emergency Use Authorization (EUA).  This EUA will  remain in effect (meaning this test can be used) for the duration of the COVID-19 declaration under Section 564(b)(1) of the Act, 21 U.S.C. section 360bbb-3(b)(1), unless the authorization is terminated or revoked sooner.  Performed at [facility], [address] [place], [place] [address]   Blood Culture (routine x 2)     Status: None   Collection Time: 09/06/22  7:38 AM   Specimen: BLOOD  Result Value Ref Range Status   Specimen Description BLOOD LEFT ANTECUBITAL  Final   Special Requests   Final    BOTTLES DRAWN AEROBIC AND ANAEROBIC Blood Culture results may not be optimal due to an inadequate volume of blood received in culture bottles   Culture   Final    NO GROWTH 5 DAYS Performed at [facility], [address], [place], [place] [address]    Report Status 09/11/2022 FINAL  Final  Blood Culture (routine x 2)     Status: None   Collection Time: 09/06/22  9:18 AM   Specimen: BLOOD  Result Value Ref Range Status   Specimen Description BLOOD RIGHT [facility]  Final   Special Requests   Final    BOTTLES DRAWN AEROBIC AND ANAEROBIC Blood Culture adequate volume   Culture   Final    NO GROWTH 5 DAYS Performed at [facility], [address] [place], [place] [address]    Report Status 09/11/2022 FINAL  Final  MRSA Next Gen by PCR, Nasal     Status: None   Collection Time: 09/07/22  6:00 AM   Specimen: Nasal Mucosa; Nasal Swab  Result Value Ref Range Status   MRSA by PCR Next Gen NOT DETECTED NOT DETECTED Final    Comment: (NOTE) The GeneXpert MRSA Assay (FDA approved for NASAL specimens only), is one component of a  comprehensive MRSA colonization surveillance program. It is not intended to diagnose MRSA infection nor to guide or monitor treatment for MRSA infections. Test performance is not FDA approved in patients less than 25 years old. Performed at [facility], [address] [place], [place] [address]   Respiratory (~20 pathogens) panel by PCR     Status: Abnormal   Collection Time: 09/28/22 10:40 PM   Specimen: Nasopharyngeal Swab; Respiratory  Result Value Ref Range Status   Adenovirus NOT DETECTED NOT DETECTED Final   Coronavirus 229E NOT DETECTED NOT DETECTED Final    Comment: (NOTE) The Coronavirus on the Respiratory Panel, DOES NOT test for the novel  Coronavirus (2019 nCoV)    Coronavirus HKU1 NOT DETECTED NOT DETECTED Final   Coronavirus NL63 NOT DETECTED NOT DETECTED Final   Coronavirus OC43 NOT DETECTED NOT DETECTED Final   Metapneumovirus NOT DETECTED NOT DETECTED Final   Rhinovirus / Enterovirus NOT DETECTED NOT DETECTED Final   Influenza A NOT DETECTED NOT DETECTED Final   Influenza B NOT DETECTED NOT DETECTED Final   Parainfluenza Virus 1 NOT DETECTED NOT DETECTED Final   Parainfluenza Virus 2 NOT DETECTED NOT DETECTED Final   Parainfluenza Virus 3 DETECTED (A) NOT DETECTED Final   Parainfluenza Virus 4 NOT DETECTED NOT DETECTED Final   Respiratory Syncytial Virus NOT DETECTED NOT DETECTED Final   Bordetella pertussis NOT DETECTED NOT DETECTED Final   Bordetella Parapertussis NOT DETECTED NOT DETECTED Final   Chlamydophila pneumoniae NOT DETECTED NOT DETECTED Final   Mycoplasma pneumoniae NOT DETECTED NOT DETECTED Final    Comment: Performed at [facility], [address], [place], [place] [address]  Urine  Culture     Status: Abnormal   Collection Time: 10/15/22  1:13 PM   Specimen: Urine, Random  Result Value Ref Range Status   Specimen Description   Final    URINE, RANDOM Performed at [facility], [address] [place], [place]  [address]    Special Requests   Final    NONE Reflexed from [phone] Performed at [facility], [address] [place], [place] [address]    Culture (A)  Final    60,000 COLONIES/mL AEROCOCCUS URINAE Standardized susceptibility testing for this organism is not available. Performed at [facility], [address] [place], [place] [address]    Report Status 10/19/2022 FINAL  Final    Labs: CBC: No results for input(s): "WBC", "NEUTROABS", "HGB", "HCT", "MCV", "PLT" in the last 168 hours. Basic Metabolic Panel: Recent Labs  Lab 11/07/22 0846 11/08/22 0814  NA 146* 147*  K 4.4 4.2  CL 111 111  CO2 25 26  GLUCOSE 104* 115*  BUN 55* 58*  CREATININE 1.37* 1.48*  CALCIUM 9.5 9.4   Liver Function Tests: No results for input(s): "AST", "ALT", "ALKPHOS", "BILITOT", "PROT", "ALBUMIN" in the last 168 hours. CBG: No results for input(s): "GLUCAP" in the last 168 hours.  Discharge time spent: greater than 30 minutes.  Signed: [name] Shutters, MD [facility] 11/11/2022

## 2022-11-11 NOTE — Care Management Important Message (Signed)
Important Message  Patient Details  Name: JADARIUS COMMONS MRN: [id number] Date of Birth: 1940-09-20   Medicare Important Message Given:  Other (see comment)  Patient's family has opted for comfort care and discharge to inpatient Hospice facility when a bed is available. Out of respect for the patient and family no Important Message from [facility] given.    Olegario Messier A Humberto Addo 11/11/2022, 9:17 AM

## 2022-11-11 NOTE — TOC Progression Note (Addendum)
Transition of Care [facility]) - Progression Note    Patient Details  Name: Terry Sloan MRN: [id number] Date of Birth: November 28, 1940  Transition of Care [facility]) CM/SW Contact  Darleene Cleaver, [place] Phone Number: 11/11/2022, 11:55 AM  Clinical Narrative:     CSW spoke to [place] [facility] known as [facility] admissions worker [place].  She said they will send a nurse to the hospital to review patient to see if he meets criteria for hospice facility.  CSW asked to be notified when the nurse will be able to evaluate patient, she said the nurse will be at the hospital some time after lunch.  CSW requested to be notified as soon as possible, because [facility] needs to know ahead of time about transporting [place] out of county.  Per Rae Halsted, if patient is eligible for hospice facility they can send a local EMS truck to pick up patient from this hospital.  CSW awaiting for a call back from facility to make sure he is eligible.  CSW updated attending physician and patient's daughter [name].  CSW continuing to follow patient's progress throughout discharge planning.  1:30pm  CSW spoke to [employer], and she said patient has been approved for inpatient hospice facility in [place].  The facility is call [facility].  Per Rae Halsted, daughter is completing paperwork, once paperwork is completed Rae Halsted will arrange EMS through either [facility] or [facility], she will let CSW know.  CSW updated attending physician and charge nurse.  Expected Discharge Plan:  (TBD) Barriers to Discharge: Continued Medical Work up  Expected Discharge Plan and Services  Hospice facility if patient qualifies.                                             Social Determinants of Health (SDOH) Interventions SDOH Screenings   Food Insecurity: Patient Unable To Answer (09/07/2022)  Housing: Low Risk  (09/07/2022)  Transportation Needs: Patient Unable To Answer (09/07/2022)  Utilities:  Not At Risk (07/23/2022)  Alcohol Screen: Low Risk  (07/23/2022)  Depression (PHQ2-9): Low Risk  (07/23/2022)  Recent Concern: Depression (PHQ2-9) - Medium Risk (06/17/2022)  Financial Resource Strain: Low Risk  (07/23/2022)  Physical Activity: Insufficiently Active (07/23/2022)  Social Connections: Socially Isolated (07/23/2022)  Stress: No Stress Concern Present (07/23/2022)  Tobacco Use: Medium Risk (09/11/2022)    Readmission Risk Interventions     No data to display

## 2022-11-11 NOTE — TOC Transition Note (Addendum)
Transition of Care [facility]) - CM/SW Discharge Note   Patient Details  Name: Terry Sloan MRN: [id number] Date of Birth: 05-30-1941  Transition of Care [facility] CM/SW Contact:  Darleene Cleaver, LCSW Phone Number: 11/11/2022, 2:42 PM   Clinical Narrative:     CSW was informed by Fatima Blank at [facility], patient has been approved and he can go to the inpatient hospice facility today.  Patient to be d/c'ed today to [facility] facility.  Patient and family agreeable to plans will transport via ems RN to call report to [phone].  CSW notified patient's daughter [name] and she is aware that patient will be discharging today.  CSW was informed that [facility] can not transport patient today, CSW called [facility] and they can transport patient today.  [facility] called at 2:40pm.    Final next level of care: Hospice Medical Facility Barriers to Discharge: Barriers Resolved   Patient Goals and CMS Choice CMS Medicare.gov Compare Post Acute Care list provided to:: Patient Represenative (must comment) Choice offered to / list presented to : [facility] POA / Guardian, Adult Children  Discharge Placement                  Patient to be transferred to facility by: [facility] Name of family member notified: Daughter [name] Patient and family notified of of transfer: 11/11/22  Discharge Plan and Services Additional resources added to the After Visit Summary for                                       Social Determinants of Health (SDOH) Interventions SDOH Screenings   Food Insecurity: Patient Unable To Answer (09/07/2022)  Housing: Low Risk  (09/07/2022)  Transportation Needs: Patient Unable To Answer (09/07/2022)  Utilities: Not At Risk (07/23/2022)  Alcohol Screen: Low Risk  (07/23/2022)  Depression (PHQ2-9): Low Risk  (07/23/2022)  Recent Concern: Depression (PHQ2-9) - Medium Risk (06/17/2022)  Financial Resource Strain: Low Risk  (07/23/2022)   Physical Activity: Insufficiently Active (07/23/2022)  Social Connections: Socially Isolated (07/23/2022)  Stress: No Stress Concern Present (07/23/2022)  Tobacco Use: Medium Risk (09/11/2022)     Readmission Risk Interventions     No data to display

## 2022-12-02 DEATH — deceased

## 2022-12-19 ENCOUNTER — Ambulatory Visit: Payer: Medicare HMO | Admitting: Family Medicine

## 2023-01-17 ENCOUNTER — Encounter (INDEPENDENT_AMBULATORY_CARE_PROVIDER_SITE_OTHER): Payer: Medicare HMO | Admitting: Ophthalmology
